# Patient Record
Sex: Female | Born: 1975
Health system: Southern US, Community
[De-identification: ages and names within clinical notes are randomized; demographics above are authoritative.]

## PROBLEM LIST (undated history)

## (undated) ENCOUNTER — Emergency Department (HOSPITAL_COMMUNITY): Payer: Medicare Other | Source: Home / Self Care

## (undated) ENCOUNTER — Emergency Department (HOSPITAL_COMMUNITY): Admission: EM | Discharge status: Absent without leave | Payer: Medicare Other

## (undated) DIAGNOSIS — T7840XA Allergy, unspecified, initial encounter: Secondary | ICD-10-CM

## (undated) DIAGNOSIS — K551 Chronic vascular disorders of intestine: Secondary | ICD-10-CM

## (undated) DIAGNOSIS — F411 Generalized anxiety disorder: Secondary | ICD-10-CM

## (undated) DIAGNOSIS — Z9884 Bariatric surgery status: Secondary | ICD-10-CM

## (undated) DIAGNOSIS — Z8619 Personal history of other infectious and parasitic diseases: Secondary | ICD-10-CM

## (undated) DIAGNOSIS — G5620 Lesion of ulnar nerve, unspecified upper limb: Secondary | ICD-10-CM

## (undated) DIAGNOSIS — F5000 Anorexia nervosa, unspecified: Secondary | ICD-10-CM

## (undated) DIAGNOSIS — Z8711 Personal history of peptic ulcer disease: Secondary | ICD-10-CM

## (undated) DIAGNOSIS — M8580 Other specified disorders of bone density and structure, unspecified site: Secondary | ICD-10-CM

## (undated) DIAGNOSIS — K5909 Other constipation: Secondary | ICD-10-CM

## (undated) DIAGNOSIS — Z8739 Personal history of other diseases of the musculoskeletal system and connective tissue: Secondary | ICD-10-CM

## (undated) DIAGNOSIS — F988 Other specified behavioral and emotional disorders with onset usually occurring in childhood and adolescence: Secondary | ICD-10-CM

## (undated) DIAGNOSIS — F321 Major depressive disorder, single episode, moderate: Secondary | ICD-10-CM

## (undated) DIAGNOSIS — Z8669 Personal history of other diseases of the nervous system and sense organs: Secondary | ICD-10-CM

## (undated) DIAGNOSIS — F1910 Other psychoactive substance abuse, uncomplicated: Secondary | ICD-10-CM

## (undated) DIAGNOSIS — K469 Unspecified abdominal hernia without obstruction or gangrene: Secondary | ICD-10-CM

## (undated) DIAGNOSIS — K219 Gastro-esophageal reflux disease without esophagitis: Secondary | ICD-10-CM

## (undated) DIAGNOSIS — F0781 Postconcussional syndrome: Secondary | ICD-10-CM

## (undated) DIAGNOSIS — Z8719 Personal history of other diseases of the digestive system: Secondary | ICD-10-CM

## (undated) DIAGNOSIS — N3941 Urge incontinence: Secondary | ICD-10-CM

## (undated) DIAGNOSIS — F639 Impulse disorder, unspecified: Secondary | ICD-10-CM

## (undated) DIAGNOSIS — G4730 Sleep apnea, unspecified: Secondary | ICD-10-CM

## (undated) DIAGNOSIS — M810 Age-related osteoporosis without current pathological fracture: Secondary | ICD-10-CM

## (undated) DIAGNOSIS — E785 Hyperlipidemia, unspecified: Secondary | ICD-10-CM

## (undated) HISTORY — DX: Chronic vascular disorders of intestine: K55.1

## (undated) HISTORY — DX: Other specified disorders of bone density and structure, unspecified site: M85.80

## (undated) HISTORY — DX: Lesion of ulnar nerve, unspecified upper limb: G56.20

## (undated) HISTORY — DX: Hyperlipidemia, unspecified: E78.5

## (undated) HISTORY — DX: Sleep apnea, unspecified: G47.30

## (undated) HISTORY — DX: Allergy, unspecified, initial encounter: T78.40XA

## (undated) HISTORY — DX: Postconcussional syndrome: F07.81

## (undated) HISTORY — DX: Other psychoactive substance abuse, uncomplicated: F19.10

## (undated) HISTORY — DX: Gastro-esophageal reflux disease without esophagitis: K21.9

## (undated) HISTORY — DX: Unspecified abdominal hernia without obstruction or gangrene: K46.9

## (undated) HISTORY — PX: CARPAL TUNNEL RELEASE: SHX101

## (undated) HISTORY — DX: Age-related osteoporosis without current pathological fracture: M81.0

## (undated) HISTORY — DX: Personal history of other diseases of the musculoskeletal system and connective tissue: Z87.39

---

## 2002-07-05 HISTORY — PX: LAPAROSCOPIC GASTRIC RESTRICTIVE DUODENAL PROCEDURE (DUODENAL SWITCH): SHX6667

## 2003-08-06 HISTORY — PX: DILATION AND CURETTAGE OF UTERUS: SHX78

## 2011-08-06 HISTORY — PX: OTHER SURGICAL HISTORY: SHX169

## 2013-11-08 ENCOUNTER — Other Ambulatory Visit: Payer: Self-pay

## 2013-11-08 ENCOUNTER — Other Ambulatory Visit (HOSPITAL_COMMUNITY)
Admission: RE | Admit: 2013-11-08 | Discharge: 2013-11-08 | Disposition: A | Discharge status: Home / Self Care | Payer: Medicare Other | Source: Ambulatory Visit | Attending: Family | Admitting: Family

## 2013-11-08 DIAGNOSIS — Z113 Encounter for screening for infections with a predominantly sexual mode of transmission: Secondary | ICD-10-CM | POA: Insufficient documentation

## 2013-11-08 DIAGNOSIS — Z0000 Encounter for general adult medical examination without abnormal findings: Secondary | ICD-10-CM | POA: Insufficient documentation

## 2013-11-16 ENCOUNTER — Encounter (INDEPENDENT_AMBULATORY_CARE_PROVIDER_SITE_OTHER): Payer: Self-pay

## 2013-11-16 ENCOUNTER — Ambulatory Visit (INDEPENDENT_AMBULATORY_CARE_PROVIDER_SITE_OTHER): Payer: 59 | Admitting: Psychiatry

## 2013-11-16 ENCOUNTER — Encounter (HOSPITAL_COMMUNITY): Payer: Self-pay | Admitting: Psychiatry

## 2013-11-16 VITALS — BP 110/74 | HR 65 | Ht 62.0 in | Wt 107.2 lb

## 2013-11-16 DIAGNOSIS — F5000 Anorexia nervosa, unspecified: Secondary | ICD-10-CM

## 2013-11-16 DIAGNOSIS — F1220 Cannabis dependence, uncomplicated: Secondary | ICD-10-CM

## 2013-11-16 DIAGNOSIS — G4700 Insomnia, unspecified: Secondary | ICD-10-CM

## 2013-11-16 DIAGNOSIS — F639 Impulse disorder, unspecified: Secondary | ICD-10-CM

## 2013-11-16 DIAGNOSIS — F1210 Cannabis abuse, uncomplicated: Secondary | ICD-10-CM

## 2013-11-16 DIAGNOSIS — F32A Depression, unspecified: Secondary | ICD-10-CM

## 2013-11-16 DIAGNOSIS — F411 Generalized anxiety disorder: Secondary | ICD-10-CM

## 2013-11-16 DIAGNOSIS — F329 Major depressive disorder, single episode, unspecified: Secondary | ICD-10-CM

## 2013-11-16 MED ORDER — ZOLPIDEM TARTRATE 10 MG PO TABS: 10.0000 mg | ORAL_TABLET | Freq: Every evening | ORAL | Status: DC | PRN

## 2013-11-16 MED ORDER — LAMOTRIGINE 25 MG PO TABS: ORAL_TABLET | ORAL | Status: DC

## 2013-11-16 MED ORDER — LAMOTRIGINE 150 MG PO TABS: 300.0000 mg | ORAL_TABLET | Freq: Every day | ORAL | Status: DC

## 2013-11-16 MED ORDER — LAMOTRIGINE 200 MG PO TABS: ORAL_TABLET | ORAL | Status: DC

## 2013-11-16 MED ORDER — LORAZEPAM 0.5 MG PO TABS: 0.5000 mg | ORAL_TABLET | Freq: Two times a day (BID) | ORAL | Status: DC | PRN
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
## 2013-11-16 NOTE — Patient Instructions (Signed)
Fax labs to [phone]   Continue Ativan and Ambien. Increase Lamical to 275mg  for 2 weeks then increase to 300mg  daily.   Start therapy for eating disorder

## 2013-11-17 ENCOUNTER — Encounter (HOSPITAL_COMMUNITY): Payer: Self-pay | Admitting: Psychiatry

## 2013-11-17 DIAGNOSIS — F5000 Anorexia nervosa, unspecified: Secondary | ICD-10-CM | POA: Insufficient documentation

## 2013-11-17 DIAGNOSIS — G4700 Insomnia, unspecified: Secondary | ICD-10-CM | POA: Insufficient documentation

## 2013-11-17 DIAGNOSIS — F639 Impulse disorder, unspecified: Secondary | ICD-10-CM | POA: Insufficient documentation

## 2013-11-17 DIAGNOSIS — F1210 Cannabis abuse, uncomplicated: Secondary | ICD-10-CM | POA: Insufficient documentation

## 2013-12-01 ENCOUNTER — Other Ambulatory Visit: Payer: Self-pay | Admitting: Gastroenterology

## 2013-12-01 DIAGNOSIS — R109 Unspecified abdominal pain: Secondary | ICD-10-CM

## 2013-12-07 ENCOUNTER — Ambulatory Visit
Admission: RE | Admit: 2013-12-07 | Discharge: 2013-12-07 | Disposition: A | Discharge status: Home / Self Care | Payer: Medicare Other | Source: Ambulatory Visit | Attending: Gastroenterology | Admitting: Gastroenterology

## 2013-12-07 DIAGNOSIS — R109 Unspecified abdominal pain: Secondary | ICD-10-CM

## 2013-12-07 MED ORDER — IOHEXOL 300 MG/ML  SOLN
100.0000 mL | Freq: Once | INTRAMUSCULAR | Status: AC | PRN
  Administered 2013-12-07: 100 mL via INTRAVENOUS

## 2013-12-24 ENCOUNTER — Encounter (HOSPITAL_COMMUNITY): Payer: Self-pay | Admitting: Emergency Medicine

## 2013-12-24 ENCOUNTER — Emergency Department (HOSPITAL_COMMUNITY): Payer: Medicare Other

## 2013-12-24 ENCOUNTER — Emergency Department (HOSPITAL_COMMUNITY)
Admission: EM | Admit: 2013-12-24 | Discharge: 2013-12-24 | Disposition: A | Discharge status: Home / Self Care | Payer: Medicare Other | Attending: Emergency Medicine | Admitting: Emergency Medicine

## 2013-12-24 DIAGNOSIS — F603 Borderline personality disorder: Secondary | ICD-10-CM | POA: Insufficient documentation

## 2013-12-24 DIAGNOSIS — Z880 Allergy status to penicillin: Secondary | ICD-10-CM | POA: Insufficient documentation

## 2013-12-24 DIAGNOSIS — F17200 Nicotine dependence, unspecified, uncomplicated: Secondary | ICD-10-CM | POA: Insufficient documentation

## 2013-12-24 DIAGNOSIS — Z79899 Other long term (current) drug therapy: Secondary | ICD-10-CM | POA: Insufficient documentation

## 2013-12-24 DIAGNOSIS — R42 Dizziness and giddiness: Secondary | ICD-10-CM | POA: Insufficient documentation

## 2013-12-24 DIAGNOSIS — E860 Dehydration: Secondary | ICD-10-CM | POA: Insufficient documentation

## 2013-12-24 DIAGNOSIS — M949 Disorder of cartilage, unspecified: Secondary | ICD-10-CM

## 2013-12-24 DIAGNOSIS — K259 Gastric ulcer, unspecified as acute or chronic, without hemorrhage or perforation: Secondary | ICD-10-CM | POA: Insufficient documentation

## 2013-12-24 DIAGNOSIS — M899 Disorder of bone, unspecified: Secondary | ICD-10-CM | POA: Insufficient documentation

## 2013-12-24 DIAGNOSIS — Z8619 Personal history of other infectious and parasitic diseases: Secondary | ICD-10-CM | POA: Insufficient documentation

## 2013-12-24 DIAGNOSIS — Z8679 Personal history of other diseases of the circulatory system: Secondary | ICD-10-CM | POA: Insufficient documentation

## 2013-12-24 DIAGNOSIS — K219 Gastro-esophageal reflux disease without esophagitis: Secondary | ICD-10-CM | POA: Insufficient documentation

## 2013-12-24 DIAGNOSIS — F5000 Anorexia nervosa, unspecified: Secondary | ICD-10-CM

## 2013-12-24 HISTORY — DX: Anorexia nervosa, unspecified: F50.00

## 2013-12-24 LAB — D-DIMER, QUANTITATIVE: D-Dimer, Quant: <0.27 ug/mL-FEU (ref 0.00–0.48)

## 2013-12-24 LAB — CBC
HCT: 38.3 % (ref 36.0–46.0)
Hemoglobin: 12.6 g/dL (ref 12.0–15.0)
MCH: 29.2 pg (ref 26.0–34.0)
MCHC: 32.9 g/dL (ref 30.0–36.0)
MCV: 88.9 fL (ref 78.0–100.0)
Platelets: 301 10*3/uL (ref 150–400)
RBC: 4.31 MIL/uL (ref 3.87–5.11)
RDW: 12.2 % (ref 11.5–15.5)
WBC: 7.5 10*3/uL (ref 4.0–10.5)

## 2013-12-24 LAB — BASIC METABOLIC PANEL
BUN: 11 mg/dL (ref 6–23)
CO2: 25 mEq/L (ref 19–32)
Calcium: 9.6 mg/dL (ref 8.4–10.5)
Chloride: 98 mEq/L (ref 96–112)
Creatinine, Ser: 0.65 mg/dL (ref 0.50–1.10)
GFR calc Af Amer: >90 mL/min (ref >90)
GFR calc non Af Amer: >90 mL/min (ref >90)
Glucose, Bld: 136 mg/dL — ABNORMAL HIGH (ref 70–99)
Potassium: 4.8 mEq/L (ref 3.7–5.3)
Sodium: 135 mEq/L — ABNORMAL LOW (ref 137–147)

## 2013-12-24 LAB — HCG, SERUM, QUALITATIVE: Preg, Serum: NEGATIVE

## 2013-12-24 MED ORDER — SODIUM CHLORIDE 0.9 % IV BOLUS (SEPSIS)
1000.0000 mL | Freq: Once | INTRAVENOUS | Status: AC
  Administered 2013-12-24: 1000 mL via INTRAVENOUS

## 2013-12-24 NOTE — Discharge Instructions (Signed)
Please follow with your primary care doctor in the next 2 days for a check-up. They must obtain records for further management.   Do not hesitate to return to the Emergency Department for any new, worsening or concerning symptoms.   Dehydration, Adult Dehydration is when you lose more fluids from the body than you take in. Vital organs like the kidneys, brain, and heart cannot function without a proper amount of fluids and salt. Any loss of fluids from the body can cause dehydration.  CAUSES   Vomiting.  Diarrhea.  Excessive sweating.  Excessive urine output.  Fever. SYMPTOMS  Mild dehydration  Thirst.  Dry lips.  Slightly dry mouth. Moderate dehydration  Very dry mouth.  Sunken eyes.  Skin does not bounce back quickly when lightly pinched and released.  Dark urine and decreased urine production.  Decreased tear production.  Headache. Severe dehydration  Very dry mouth.  Extreme thirst.  Rapid, weak pulse (more than 100 beats per minute at rest).  Cold hands and feet.  Not able to sweat in spite of heat and temperature.  Rapid breathing.  Blue lips.  Confusion and lethargy.  Difficulty being awakened.  Minimal urine production.  No tears. DIAGNOSIS  Your caregiver will diagnose dehydration based on your symptoms and your exam. Blood and urine tests will help confirm the diagnosis. The diagnostic evaluation should also identify the cause of dehydration. TREATMENT  Treatment of mild or moderate dehydration can often be done at home by increasing the amount of fluids that you drink. It is best to drink small amounts of fluid more often. Drinking too much at one time can make vomiting worse. Refer to the home care instructions below. Severe dehydration needs to be treated at the hospital where you will probably be given intravenous (IV) fluids that contain water and electrolytes. HOME CARE INSTRUCTIONS   Ask your caregiver about specific rehydration  instructions.  Drink enough fluids to keep your urine clear or pale yellow.  Drink small amounts frequently if you have nausea and vomiting.  Eat as you normally do.  Avoid:  Foods or drinks high in sugar.  Carbonated drinks.  Juice.  Extremely hot or cold fluids.  Drinks with caffeine.  Fatty, greasy foods.  Alcohol.  Tobacco.  Overeating.  Gelatin desserts.  Wash your hands well to avoid spreading bacteria and viruses.  Only take over-the-counter or prescription medicines for pain, discomfort, or fever as directed by your caregiver.  Ask your caregiver if you should continue all prescribed and over-the-counter medicines.  Keep all follow-up appointments with your caregiver. SEEK MEDICAL CARE IF:  You have abdominal pain and it increases or stays in one area (localizes).  You have a rash, stiff neck, or severe headache.  You are irritable, sleepy, or difficult to awaken.  You are weak, dizzy, or extremely thirsty. SEEK IMMEDIATE MEDICAL CARE IF:   You are unable to keep fluids down or you get worse despite treatment.  You have frequent episodes of vomiting or diarrhea.  You have blood or green matter (bile) in your vomit.  You have blood in your stool or your stool looks black and tarry.  You have not urinated in 6 to 8 hours, or you have only urinated a small amount of very dark urine.  You have a fever.  You faint. MAKE SURE YOU:   Understand these instructions.  Will watch your condition.  Will get help right away if you are not doing well or get worse. Document  Released: 07/22/2005 Document Revised: 10/14/2011 Document Reviewed: 03/11/2011 [facility] Patient Information 2014 [place], [place].  Anorexia Nervosa Anorexia nervosa is an illness in which people have difficulty with their body perception. They often see themselves as fat even though they may be dangerously thin. They have intense fears of gaining weight. Often they weigh themselves  several times per day. They often exercise compulsively and constantly starve themselves. As a result, they take in far too few calories to sustain themselves. Many anorexics do not realize there is a problem. Often, if a problem is recognized, it is rationalized or denied. Because of the constant state of starvation, many other medical problems surface. Some of the problems seen include:  The salts or ions in the blood get off kilter (electrolyte imbalance).  Fatigue and loss of mental acuity.  The calcium density in the bones is lost (osteoporosis). This leads to weaker bones which are easy to break.  Loss of menstrual cycles (amenorrhea).  Abnormally low heart rate. Cardiac problems often result in death.  Inflammation of the colon (colitis). TREATMENT  Many different therapies are used for anorexia nervosa. Not all therapies work the same for all people. This is much the same as the differences seen in people using different medications for the same problem. Help is available, however. SEEK MEDICAL CARE IF:  You or a loved one is suspected of having anorexia. It is necessary to get medical help. Anorexia nervosa can be a fatal disease. It should not be neglected. It will not get better or go away on its own. Almost 1 person in 6 with anorexia will die of the illness or commit suicide. This is a psychiatric disorder. Counseling can help with an illness that can be devastating. Your caregiver can guide you to proper information and treatment sources for this. Document Released: 07/19/2000 Document Revised: 10/14/2011 Document Reviewed: 06/01/2007 [facility] Patient Information 2014 [place], [place].

## 2013-12-24 NOTE — ED Provider Notes (Signed)
CSN: [id number]     Arrival date & time 12/24/13  1548 History   First MD Initiated Contact with Patient 12/24/13 1815     Chief Complaint  Patient presents with  . Tachycardia  . Dizziness     (Consider location/radiation/quality/duration/timing/severity/associated sxs/prior Treatment) HPI  Karen Hahn is a 38 y.o. female with PMH of anorexia c/o 7 days of worsening light headedness with heart pounding palpitations. Patient states that she is has been eating 2 meals a day however she is fluid restricting because of the anorexia, states that the only water she drinks his with medication, she drinks coffee in the morning. She denies chest pain, shortness of breath endorses a mild cough and runny nose. Pt denies fever,  h/o DVT/PE, calf pain or leg swelling, hemoptysis, recent immobilization, cancer/chemotherapy in the last 6 months, exogenous estrogen.    Past Medical History  Diagnosis Date  . SMAS (superior mesenteric artery syndrome)   . History of borderline personality disorder   . GERD (gastroesophageal reflux disease)   . Hypotension   . Osteopenia   . Gastric ulcer due to Helicobacter pylori   . Anorexia nervosa without bulimia    Past Surgical History  Procedure Laterality Date  . Laparoscopic abdominal exploration  2003  . Gastric bypass  07/2002  . Dilation and curettage of uterus  2005   Family History  Problem Relation Age of Onset  . Alcohol abuse Mother   . Anxiety disorder Mother   . Depression Mother     severe with hx of suicide attempt. treated with ECT  . Anxiety disorder Father   . Drug abuse Father   . Alcohol abuse Maternal Uncle   . Anxiety disorder Maternal Grandmother   . Depression Maternal Grandmother    History  Substance Use Topics  . Smoking status: Current Some Day Smoker -- 0.25 packs/day for .5 years    Types: Cigarettes  . Smokeless tobacco: Never Used  . Alcohol Use: No   OB History   Grav Para Term Preterm Abortions TAB SAB  Ect Mult Living                 Review of Systems  10 systems reviewed and found to be negative, except as noted in the HPI.  Allergies  Penicillins and Diflucan  Home Medications   Prior to Admission medications   Medication Sig Start Date End Date Taking? Authorizing Provider  Calcium-Vitamin D-Vitamin K 500-100-40 MG-UNT-MCG CHEW Chew 1 tablet by mouth 2 (two) times daily.   Yes Historical Provider, MD  cholecalciferol (VITAMIN D) 1000 UNITS tablet Take 2,000 Units by mouth daily.   Yes Historical Provider, MD  dicyclomine (BENTYL) 10 MG/5ML syrup Take 10 mg by mouth 3 (three) times daily before meals.   Yes Historical Provider, MD  lamoTRIgine (LAMICTAL) 150 MG tablet Take 2 tablets (300 mg total) by mouth daily. 11/16/13  Yes Oletta Darter, MD  LORazepam (ATIVAN) 0.5 MG tablet Take 1 tablet (0.5 mg total) by mouth 2 (two) times daily as needed for anxiety. 11/16/13  Yes Oletta Darter, MD  Multiple Vitamins-Minerals (MULTIVITAL PO) Take 1 tablet by mouth 2 (two) times daily.   Yes Historical Provider, MD  omeprazole (PRILOSEC) 40 MG capsule Take 40 mg by mouth daily.   Yes Historical Provider, MD  ondansetron (ZOFRAN) 8 MG tablet Take by mouth 3 (three) times daily as needed for nausea or vomiting.   Yes Historical Provider, MD  ranitidine (ZANTAC) 150 MG tablet Take  150 mg by mouth 2 (two) times daily.   Yes Historical Provider, MD  zolpidem (AMBIEN) 10 MG tablet Take 1 tablet (10 mg total) by mouth at bedtime as needed for sleep. 11/16/13  Yes Oletta Darter, MD   BP 113/70  Pulse 104  Temp(Src) 98.3 F (36.8 C) (Oral)  Resp 11  Ht 5\' 2"  (1.575 m)  Wt 104 lb (47.174 kg)  BMI 19.02 kg/m2  SpO2 98%  LMP 11/24/2013 Physical Exam  Nursing note and vitals reviewed. Constitutional: She is oriented to person, place, and time. She appears well-developed. No distress.  Frail, poorly nourished  HENT:  Head: Normocephalic and atraumatic.  Mouth/Throat: Oropharynx is clear and  moist.  Very dry mucous membranes  Eyes: Conjunctivae and EOM are normal. Pupils are equal, round, and reactive to light.  Neck: Normal range of motion.  Cardiovascular: Normal rate, regular rhythm and intact distal pulses.   Pulmonary/Chest: Effort normal and breath sounds normal. No stridor.  Abdominal: Soft. Bowel sounds are normal.  Musculoskeletal: Normal range of motion. She exhibits no edema and no tenderness.  No calf asymmetry, superficial collaterals, palpable cords, edema, Homans sign negative bilaterally.    Neurological: She is alert and oriented to person, place, and time.  Skin: She is not diaphoretic.  Psychiatric: She has a normal mood and affect.    ED Course  Procedures (including critical care time) Labs Review Labs Reviewed  BASIC METABOLIC PANEL - Abnormal; Notable for the following:    Sodium 135 (*)    Glucose, Bld 136 (*)    All other components within normal limits  CBC  HCG, SERUM, QUALITATIVE  D-DIMER, QUANTITATIVE    Imaging Review Dg Chest 2 View  12/24/2013   CLINICAL DATA:  Dizziness, increased heart rate  EXAM: CHEST  2 VIEW  COMPARISON:  None.  FINDINGS: Cardiomediastinal silhouette is unremarkable. No acute infiltrate or pleural effusion. No pulmonary edema. Bony thorax is unremarkable.  IMPRESSION: No active cardiopulmonary disease.   Electronically Signed   By: 12/26/2013 M.D.   On: 12/24/2013 19:06     EKG Interpretation None     Normal sinus rhythm, 97 beats per minute, mild right axis deviation, normal intervals, nonspecific ST/T-wave changes.   MDM   Final diagnoses:  Anorexia nervosa  Dehydration    Filed Vitals:   12/24/13 1601 12/24/13 1839  BP: 112/74 113/70  Pulse: 104   Temp: 98.3 F (36.8 C)   TempSrc: Oral   Resp: 16 11  Height: 5\' 2"  (1.575 m)   Weight: 104 lb (47.174 kg)   SpO2: 98% 98%    Medications  sodium chloride 0.9 % bolus 1,000 mL (not administered)    Karen Hahn is a 38 y.o. female  presenting with  palpitations and dizziness. Likely secondary to dehydration from anorexia and water restriction. Clinically very dry, counseled patient extensively on importance of hydration. Patient will be bolused and ED. Electrolytes with no significant abnormalities, EKG with right-sided strain, therefore, d-dimer ordered. Which is negative. Chest x-ray with no abnormalities. Patient will be bolused a liter of fluid. Patient's establish outpatient care, will followup with primary care on June 4. Discussion return precautions with both patient and friend. Seems reliable for followup.   Note: Portions of this report may have been transcribed using voice recognition software. Every effort was made to ensure accuracy; however, inadvertent computerized transcription errors may be present     31, PA-C 12/24/13 2016

## 2013-12-24 NOTE — ED Notes (Signed)
PResents with dizziness, feeling heart racing, diaphoretic episodes, feeling really cold and lightheaded for a couple of weeks. HR is 98 at this time. Pt reports she does not feel it now. Endorses Anorexia and not eating or drinking well over the last few weeks. 20 years of anorexia DX.

## 2013-12-25 NOTE — ED Provider Notes (Signed)
Medical screening examination/treatment/procedure(s) were performed by non-physician practitioner and as supervising physician I was immediately available for consultation/collaboration.   EKG Interpretation None       Yaroslav Gombos, MD 12/25/13 1551 

## 2014-01-05 ENCOUNTER — Telehealth (HOSPITAL_COMMUNITY): Payer: Self-pay | Admitting: *Deleted

## 2014-01-05 DIAGNOSIS — F411 Generalized anxiety disorder: Secondary | ICD-10-CM

## 2014-01-05 DIAGNOSIS — G4700 Insomnia, unspecified: Secondary | ICD-10-CM

## 2014-01-05 NOTE — Telephone Encounter (Signed)
Patient left UE:AVWU seen in April.MD called in prescriptions for Ambien and Ativan to [facility], but pharmacy says it was too soon so they did not fill them.Pharmacy instructed her to call MD to have them called in. Contacted patient:Informed patient that per chart, prescriptions for those two medications were printed on 11/16/13. Patient states she did not receive any prescriptions after her appt. Contacted pharmacy, they did not record receiving either of these prescriptions called in to them. Informed patient that provider will be informed of her request.

## 2014-01-06 MED ORDER — ZOLPIDEM TARTRATE 10 MG PO TABS: 10.0000 mg | ORAL_TABLET | Freq: Every evening | ORAL | Status: DC | PRN

## 2014-01-06 MED ORDER — LORAZEPAM 0.5 MG PO TABS: 0.5000 mg | ORAL_TABLET | Freq: Two times a day (BID) | ORAL | Status: DC | PRN

## 2014-01-06 NOTE — Addendum Note (Signed)
Addended by: Tonny Bollman on: 01/06/2014 12:29 PM   Modules accepted: Orders

## 2014-01-11 ENCOUNTER — Ambulatory Visit: Payer: Self-pay | Admitting: Psychology

## 2014-01-18 ENCOUNTER — Ambulatory Visit (INDEPENDENT_AMBULATORY_CARE_PROVIDER_SITE_OTHER): Payer: 59 | Admitting: Psychiatry

## 2014-01-18 ENCOUNTER — Encounter (HOSPITAL_COMMUNITY): Payer: Self-pay | Admitting: Psychiatry

## 2014-01-18 VITALS — BP 107/67 | HR 82 | Ht 62.0 in | Wt 107.2 lb

## 2014-01-18 DIAGNOSIS — F1220 Cannabis dependence, uncomplicated: Secondary | ICD-10-CM

## 2014-01-18 DIAGNOSIS — F32A Depression, unspecified: Secondary | ICD-10-CM

## 2014-01-18 DIAGNOSIS — F5000 Anorexia nervosa, unspecified: Secondary | ICD-10-CM

## 2014-01-18 DIAGNOSIS — F411 Generalized anxiety disorder: Secondary | ICD-10-CM

## 2014-01-18 DIAGNOSIS — F639 Impulse disorder, unspecified: Secondary | ICD-10-CM

## 2014-01-18 DIAGNOSIS — F329 Major depressive disorder, single episode, unspecified: Secondary | ICD-10-CM

## 2014-01-18 DIAGNOSIS — F3289 Other specified depressive episodes: Secondary | ICD-10-CM

## 2014-01-18 MED ORDER — LORAZEPAM 0.5 MG PO TABS: 0.5000 mg | ORAL_TABLET | Freq: Two times a day (BID) | ORAL | Status: DC | PRN

## 2014-01-18 MED ORDER — OLANZAPINE 5 MG PO TABS: 5.0000 mg | ORAL_TABLET | Freq: Every day | ORAL | Status: DC

## 2014-01-18 MED ORDER — LAMOTRIGINE 150 MG PO TABS: 300.0000 mg | ORAL_TABLET | Freq: Every day | ORAL | Status: DC

## 2014-01-18 NOTE — Progress Notes (Signed)
[facility] [address] Progress Note  Karen Hahn [id number] 38 y.o.  01/18/2014 1:07 PM  Chief Complaint: angry outbursts  History of Present Illness: Here with best friend who pt gave permission to stay and share information.  Pt states she is having angry outbursts and will get verbally aggressive, throwing things and slamming doors. It happens more around the time of her period but will happen at other times as well. Denies any change in frequency.  Pt is picking at her skin on daily basis. States it helps relieve her anxiety a little.  Depression is a little worse. Pt doesn't feel motivated to do much during the day but can be persuaded by her friend. Reports memory is poor and she is easily distracted. Reports getting lost and forgetful in conversations. Pt is unable to recall conversations and events. Sates it has been going on for years and feels like it is getting worse recently.   Anxiety is significantly increased. States everything causes anxiety.  Endorsing nausea, stomach aches, racing thoughts, insomnia. Reports sleeping 2-3 hrs/night. Ambien helps her fall asleep but not stay asleep. Her friend states Ambien causes confusion and disorientation after pt takes it. Pt is taking Ativan BID and it "levels me out a little". Friend states Ativan works to calm pt down a little. Energy is fair.   In May she went to the ED for dehydration and severe appetite restriction. Triggered by the death of her cat. Pt now has a new cat. Reports she is now eating 2-3 meals a day. Pt is eating a decent proportion each meal. She states weight is 110.2 lbs.   Still smoking THC thur out each daily.   Pt is starting therapy next month with Dr. Theron Arista.   Not sure if increased dose of Lamictal is helping.  Suicidal Ideation: No Plan Formed: No Patient has means to carry out plan: No  Homicidal Ideation: No Plan Formed: No Patient has means to carry out plan: No  Review of  Systems: Psychiatric: Agitation: Yes Hallucination: No Depressed Mood: Yes Insomnia: Yes Hypersomnia: No Altered Concentration: Yes Feels Worthless: Yes Grandiose Ideas: No Belief In Special Powers: No New/Increased Substance Abuse: No Compulsions: Yes  Neurologic: Headache: No Seizure: No Paresthesias: No  Past Medical, Family, Social History: Endorsing family hx of depression, alcohol abuse and anxiety disorder. Pt is living with her friend in [place], [place]. She is divorced and was married for 5 yrs. Pt has a GED. Currently unemployed. Pt smokes a few cigs a week. She smokes THC thru the day. Denies alcohol use.   Outpatient Encounter Prescriptions as of 01/18/2014  Medication Sig  . Calcium-Vitamin D-Vitamin K 500-100-40 MG-UNT-MCG CHEW Chew 1 tablet by mouth 2 (two) times daily.  . cholecalciferol (VITAMIN D) 1000 UNITS tablet Take 2,000 Units by mouth daily.  Marland Kitchen dicyclomine (BENTYL) 10 MG/5ML syrup Take 10 mg by mouth 3 (three) times daily before meals.  . lamoTRIgine (LAMICTAL) 150 MG tablet Take 2 tablets (300 mg total) by mouth daily.  Marland Kitchen LORazepam (ATIVAN) 0.5 MG tablet Take 1 tablet (0.5 mg total) by mouth 2 (two) times daily as needed for anxiety.  . Multiple Vitamins-Minerals (MULTIVITAL PO) Take 1 tablet by mouth 2 (two) times daily.  Marland Kitchen omeprazole (PRILOSEC) 40 MG capsule Take 40 mg by mouth daily.  . ondansetron (ZOFRAN) 8 MG tablet Take by mouth 3 (three) times daily as needed for nausea or vomiting.  . ranitidine (ZANTAC) 150 MG tablet Take 150 mg by  mouth 2 (two) times daily.  Marland Kitchen zolpidem (AMBIEN) 10 MG tablet Take 1 tablet (10 mg total) by mouth at bedtime as needed for sleep.    Past Psychiatric History/Hospitalization(s): Anxiety: Yes Bipolar Disorder: No Depression: Yes Mania: No Psychosis: No Schizophrenia: No Personality Disorder: No Hospitalization for psychiatric illness: Yes numerous- 2 intensive eating program treatments in 2013 and 2014. BH  eating d/o with Dr. Orland Dec Nov 2011. Multiple inpt hospitalizations at [facility] (2011, 2011, 2005), [place] (3x), [facility], [place], [place] (06/2013).  History of Electroconvulsive Shock Therapy: No Prior Suicide Attempts: No  Physical Exam: Constitutional:  BP 107/67  Pulse 82  Ht 5\' 2"  (1.575 m)  Wt 107 lb 3.2 oz (48.626 kg)  BMI 19.60 kg/m2  LMP 11/24/2013  General Appearance: alert, oriented, no acute distress  Musculoskeletal: Strength & Muscle Tone: within normal limits Gait & Station: normal Patient leans: N/A  Mental Status Examination/Evaluation:  Objective: Appearance: Fairly Groomed and Neat, thin. visible scars and scabs all over face. Appears older than stated age.   Eye Contact:: Good   Speech: Clear and Coherent and Normal Rate   Volume: Normal   Mood: depressed  Affect: Appropriate and Full Range   Thought Process: Goal Directed, Linear and Logical   Orientation: Full (Time, Place, and Person)   Thought Content: WDL   Suicidal Thoughts: No   Homicidal Thoughts: No   Judgement: Fair   Insight: Fair   Psychomotor Activity: Normal   Akathisia: No   Handed: Right   AIMS (if indicated): n/a     Medical Decision Making (Choose Three): Review of Psycho-Social Stressors (1), Review or order clinical lab tests (1), Established Problem, Worsening (2), Review of Medication Regimen & Side Effects (2) and Review of New Medication or Change in Dosage (2)  Assessment: AXIS I  Axis I: Impulse control disorder, Anorexia Nervosa, Cannabis dependence, Depressive d/o NOS, GAD  AXIS II  Deferred   AXIS III  Past Medical History    Diagnosis  Date    .  SMAS (superior mesenteric artery syndrome)     .  History of borderline personality disorder     .  GERD (gastroesophageal reflux disease)     .  Hypotension     .  Osteopenia     .  Gastric ulcer due to Helicobacter pylori    AXIS IV  other psychosocial or environmental problems and problems with primary support group    AXIS V  51-60 moderate symptoms       Treatment Plan/Recommendations:  Plan of Care:  Start trial of Zyprexa, risks/benefits and SE of the medication discussed. Pt verbalized understanding and verbal consent obtained for treatment.  Affirm with the patient that the medications are taken as ordered. Patient expressed understanding of how their medications were to be used.    Laboratory: reviewed labs and EKG from 12/24/13. EKG shows QTc 449, NSR with right axis deviation, possible right ventricular hypertrophy  Psychotherapy:Therapy: brief supportive therapy provided. Discussed psychosocial stressors in detail.      Medications: discontinue Ambien as it is no longer effective Continue Lamictal 300mg  for mood stabalization Continue Ativan 0.5mg  po BID for prn for anxiety Start trial of Zyprexa 5mg  po qHS for mood stabalization, adjunct for depression and anxiety Consider Wellbutrin if depression does not improve  Routine PRN Medications: Yes   Consultations: encouraged pt to attend therapy with Dr. Theron Arista  Safety Concerns: Pt denies SI and is at an acute low risk for suicide.Patient  told to call clinic if any problems occur. Patient advised to go to ER  if she should develop SI/HI, side effects, or if symptoms worsen. Has crisis numbers to call if needed. Pt verbalized understanding.   Other: F/up in 2 months or sooner if needed      Oletta Darter, MD 01/18/2014

## 2014-02-08 ENCOUNTER — Ambulatory Visit (INDEPENDENT_AMBULATORY_CARE_PROVIDER_SITE_OTHER): Payer: 59 | Admitting: Psychiatry

## 2014-02-08 DIAGNOSIS — F509 Eating disorder, unspecified: Secondary | ICD-10-CM

## 2014-02-08 DIAGNOSIS — F331 Major depressive disorder, recurrent, moderate: Secondary | ICD-10-CM

## 2014-03-08 ENCOUNTER — Telehealth (HOSPITAL_COMMUNITY): Payer: Self-pay | Admitting: *Deleted

## 2014-03-08 ENCOUNTER — Ambulatory Visit (INDEPENDENT_AMBULATORY_CARE_PROVIDER_SITE_OTHER): Payer: 59 | Admitting: Psychiatry

## 2014-03-08 ENCOUNTER — Encounter (HOSPITAL_COMMUNITY): Payer: Self-pay | Admitting: Psychiatry

## 2014-03-08 VITALS — BP 100/69 | HR 90 | Ht 62.0 in | Wt 111.2 lb

## 2014-03-08 DIAGNOSIS — F329 Major depressive disorder, single episode, unspecified: Secondary | ICD-10-CM

## 2014-03-08 DIAGNOSIS — F331 Major depressive disorder, recurrent, moderate: Secondary | ICD-10-CM

## 2014-03-08 DIAGNOSIS — F32A Depression, unspecified: Secondary | ICD-10-CM | POA: Insufficient documentation

## 2014-03-08 DIAGNOSIS — F411 Generalized anxiety disorder: Secondary | ICD-10-CM

## 2014-03-08 DIAGNOSIS — F3289 Other specified depressive episodes: Secondary | ICD-10-CM

## 2014-03-08 DIAGNOSIS — F509 Eating disorder, unspecified: Secondary | ICD-10-CM

## 2014-03-08 MED ORDER — MIRTAZAPINE 15 MG PO TABS: 15.0000 mg | ORAL_TABLET | Freq: Every day | ORAL | Status: DC

## 2014-03-08 MED ORDER — OLANZAPINE 10 MG PO TABS: 10.0000 mg | ORAL_TABLET | Freq: Every day | ORAL | Status: DC

## 2014-03-08 MED ORDER — LAMOTRIGINE 150 MG PO TABS: 300.0000 mg | ORAL_TABLET | Freq: Every day | ORAL | Status: DC

## 2014-03-08 NOTE — Progress Notes (Signed)
Patient ID: Karen Hahn, female   DOB: 1975-10-03, 38 y.o.   MRN: [id number]  [facility] [id number] Progress Note  [name] [id number] 38 y.o.  03/08/2014 11:34 AM  Chief Complaint: less irritable than before  History of Present Illness: Here with best friend who pt gave permission to stay and share information.  Pt states she is having less frequent angry outbursts and verbally aggression. She is no longer throwing things and slamming doors. Irritability remains a problem on a daily basis. Pt is snapping at everyone and doesn't want to interact with others during these times. States she is easily frustrated. Pt doesn't think Zyprexa is helping with irritability.   Pt is picking at her skin on daily basis. States it helps relieve her anxiety a little. No change in frequency.   Depression is bad and she is sad daily. Pt still doesn't feel motivated to do much during the day but can be persuaded by her friend. Denies anhedonia. Pt is only sleeping about 5 hrs/night with 2 tabs of Ativan. States she is only taking it at night and doesn't think it is helping much with anxiety. She is no longer taking Ambien. Energy is fair.   Reports memory is poor and she is easily distracted. Pt is unable to focus and is overstimulated easily. Reports getting lost and forgetful in conversations. Pt is unable to recall conversations and events. Pt is unable to follow directions. Sates it has been going on for years and feels like it is getting worse recently. Pt thinks she has ADD.   Anxiety is significantly increased and it is getting worse. States everything causes anxiety.  Endorsing nausea, stomach aches, racing thoughts, insomnia.  Pt is taking Ativan BID and it "levels me out a little". Friend states Ativan works to calm pt down a little.   Reports she is now eating 2-3 meals a day with snacks thru out the day. Pt is eating a decent portion each meal. Pt states THC helps to increase her  appetite. She states weight is 111 lbs today. Body image remains low.   Still smoking THC thur out each day.   Pt is in therapy with Dr. Theron Arista. She is seeing him once/month and likes it very much. They are working on her hx of abuse.   States she is taking Lamictal, Zyprexa and Ativan as prescribed. Denies SE from meds.   Suicidal Ideation: No Plan Formed: No Patient has means to carry out plan: No  Homicidal Ideation: No Plan Formed: No Patient has means to carry out plan: No  Review of Systems: Psychiatric: Agitation: Yes Hallucination: No Depressed Mood: Yes Insomnia: Yes Hypersomnia: No Altered Concentration: Yes Feels Worthless: Yes Grandiose Ideas: No Belief In Special Powers: No New/Increased Substance Abuse: No Compulsions: Yes  Neurologic: Headache: No Seizure: No Paresthesias: No  Past Medical, Family, Social History: Endorsing family hx of depression, ADD, alcohol abuse and anxiety disorder. Pt is living with her friend in [place], [place]. She is divorced and was married for 5 yrs. Pt has a GED. Currently unemployed. Pt smokes a few cigs a week. She smokes THC thru the day. Denies alcohol use.   Outpatient Encounter Prescriptions as of 03/08/2014  Medication Sig  . Calcium-Vitamin D-Vitamin K 500-100-40 MG-UNT-MCG CHEW Chew 1 tablet by mouth 2 (two) times daily.  . cholecalciferol (VITAMIN D) 1000 UNITS tablet Take 2,000 Units by mouth daily.  Marland Kitchen dicyclomine (BENTYL) 10 MG/5ML syrup Take 10 mg by mouth 3 (three)  times daily before meals.  . lamoTRIgine (LAMICTAL) 150 MG tablet Take 2 tablets (300 mg total) by mouth daily.  Marland Kitchen LORazepam (ATIVAN) 0.5 MG tablet Take 1 tablet (0.5 mg total) by mouth 2 (two) times daily as needed for anxiety.  . Multiple Vitamins-Minerals (MULTIVITAL PO) Take 1 tablet by mouth 2 (two) times daily.  . Norgestimate-Ethinyl Estradiol Triphasic (ORTHO TRI-CYCLEN, 28,) 0.18/0.215/0.25 MG-35 MCG tablet Take 1 tablet by mouth daily.  Marland Kitchen  OLANZapine (ZYPREXA) 5 MG tablet Take 1 tablet (5 mg total) by mouth at bedtime.  Marland Kitchen omeprazole (PRILOSEC) 40 MG capsule Take 40 mg by mouth daily.  . ondansetron (ZOFRAN) 8 MG tablet Take by mouth 3 (three) times daily as needed for nausea or vomiting.  . ranitidine (ZANTAC) 150 MG tablet Take 150 mg by mouth 2 (two) times daily.    Past Psychiatric History/Hospitalization(s): Anxiety: Yes Bipolar Disorder: No Depression: Yes Mania: No Psychosis: No Schizophrenia: No Personality Disorder: No Hospitalization for psychiatric illness: Yes numerous- 2 intensive eating program treatments in 2013 and 2014. BH eating d/o with Dr. Orland Dec Nov 2011. Multiple inpt hospitalizations at [facility] (2011, 2011, 2005), [place] (3x), [facility], [place], [place] (06/2013).  History of Electroconvulsive Shock Therapy: No Prior Suicide Attempts: No  Physical Exam: Constitutional:  BP 100/69  Pulse 90  Ht 5\' 2"  (1.575 m)  Wt 111 lb 3.2 oz (50.44 kg)  BMI 20.33 kg/m2  General Appearance: alert, oriented, no acute distress  Musculoskeletal: Strength & Muscle Tone: within normal limits Gait & Station: normal Patient leans: N/A  Mental Status Examination/Evaluation: Objective: Attitude: Calm and cooperative  Appearance:  Fairly Groomed and Neat, thin. visible scars and scabs all over face. Appears older than stated age.   Eye Contact::  Good  Speech:  Clear and Coherent and Normal Rate  Volume:  Normal  Mood:  depressed  Affect:  Full Range  Thought Process:  Goal Directed, Linear and Logical  Orientation:  Full (Time, Place, and Person)  Thought Content:  WDL  Suicidal Thoughts:  No  Homicidal Thoughts:  No  Judgement:  Fair  Insight:  Fair  Concentration: good  Memory: Immediate-fair Recent-fair Remote-fair  Recall: fair  Language: fair  Gait and Station: normal  [phone] of Knowledge: average  Psychomotor Activity:  Normal  Akathisia:  No  Handed:  Right  AIMS (if indicated):   Facial and Oral Movements  Muscles of Facial Expression: None, normal  Lips and Perioral Area: None, normal  Jaw: None, normal  Tongue: None, normal Extremity Movements: Upper (arms, wrists, hands, fingers): None, normal  Lower (legs, knees, ankles, toes): None, normal,  Trunk Movements:  Neck, shoulders, hips: None, normal,  Overall Severity : Severity of abnormal movements (highest score from questions above): None, normal  Incapacitation due to abnormal movements: None, normal  Patient's awareness of abnormal movements (rate only patient's report): No Awareness, Dental Status  Current problems with teeth and/or dentures?: No  Does patient usually wear dentures?: No       Medical Decision Making (Choose Three): Review of Psycho-Social Stressors (1), Review or order clinical lab tests (1), Established Problem, Worsening (2), Review of Medication Regimen & Side Effects (2) and Review of New Medication or Change in Dosage (2)  Assessment: AXIS I  Axis I: Impulse control disorder, Anorexia Nervosa, Cannabis dependence, Depressive d/o NOS, GAD  AXIS II  Deferred   AXIS III  Past Medical History    Diagnosis  Date    .  SMAS (  superior mesenteric artery syndrome)     .  History of borderline personality disorder     .  GERD (gastroesophageal reflux disease)     .  Hypotension     .  Osteopenia     .  Gastric ulcer due to Helicobacter pylori    AXIS IV  other psychosocial or environmental problems and problems with primary support group   AXIS V  51-60 moderate symptoms       Treatment Plan/Recommendations:  Plan of Care:   Medication management with supportive therapy. Risks/benefits and SE of the medication discussed. Pt verbalized understanding and verbal consent obtained for treatment.  Affirm with the patient that the medications are taken as ordered. Patient expressed understanding of how their medications were to be used.  - worsening of symptoms   Laboratory: reviewed  labs and EKG from 12/24/13. EKG shows QTc 449, NSR with right axis deviation, possible right ventricular hypertrophy. CBC WNL, Na mildly decreased, glucose elevated  Psychotherapy:Therapy: brief supportive therapy provided. Discussed psychosocial stressors in detail.     -reviewed sleep hygiene in detail  Medications: discontinue  -Continue Lamictal 300mg  for mood stabalization -discontinue Ativan due to memory issues and ineffectiveness -Increase Zyprexa to 10mg  po qHS for mood stabalization, adjunct for depression and anxiety -start trial of Remeron 15mg  po qHS for mood, anxiety, sleep and appetite   Routine PRN Medications: No  Consultations: encouraged pt to attend therapy with Dr. Theron AristaPeter  Safety Concerns: Pt denies SI and is at an acute low risk for suicide.Patient told to call clinic if any problems occur. Patient advised to go to ER  if she should develop SI/HI, side effects, or if symptoms worsen. Has crisis numbers to call if needed. Pt verbalized understanding.   Other: F/up in 2 months or sooner if needed      Oletta DarterAGARWAL, Levi Crass, MD 03/08/2014

## 2014-03-08 NOTE — Telephone Encounter (Signed)
Pharmacist left IY:MEBRAXENMM wants to talk with provider regrding possible drug interaction with Olanzapine and Mirtazapine.

## 2014-03-08 NOTE — Telephone Encounter (Signed)
Spoke with pharmacist who states there is a concern for a drug drug interaction b/w Zyprexa and Remeron. States he spoke with the pt who picked up teh Remeron script. She verbalized understanding and did not fill the Zyprexa prescription.   A/P:Impulse control disorder, Anorexia Nervosa, Cannabis dependence, Depressive d/o NOS, GAD  -Discontinue Zyprexa -Continue Lamictal 300mg  for mood stabalization  -start trial of Remeron 15mg  po qHS for mood, anxiety, sleep and appetite

## 2014-03-08 NOTE — Addendum Note (Signed)
Addended by: Oletta Darter on: 03/08/2014 03:41 PM   Modules accepted: Orders, Medications

## 2014-03-10 ENCOUNTER — Telehealth (HOSPITAL_COMMUNITY): Payer: Self-pay | Admitting: *Deleted

## 2014-03-10 NOTE — Telephone Encounter (Signed)
Pt called stating her pharmacist told her to notify her Doctor that the new medication that was to be started could cause possible drug interactions. Pt wants to know if it is OK to take all medications together since the pharmacist is questioning it.

## 2014-03-10 NOTE — Telephone Encounter (Addendum)
Called and left a message for pt to call back to discuss meds. In voice message instructed patient to take either Remeron or Zyprexa. She should pick one of the 2 meds and take that specific medication daily but not to take both medications daily.

## 2014-03-14 ENCOUNTER — Telehealth (HOSPITAL_COMMUNITY): Payer: Self-pay | Admitting: *Deleted

## 2014-03-14 DIAGNOSIS — F639 Impulse disorder, unspecified: Secondary | ICD-10-CM

## 2014-03-14 NOTE — Telephone Encounter (Signed)
Patient called this morning  03/14/14 @ 9:30am requesting a refill on Olanzapine 5mg  1tablet @ night.

## 2014-03-15 MED ORDER — OLANZAPINE 10 MG PO TABS: 10.0000 mg | ORAL_TABLET | Freq: Every day | ORAL | Status: DC

## 2014-03-15 NOTE — Telephone Encounter (Signed)
Called and left a voice message asking pt to call back to discuss meds.

## 2014-03-15 NOTE — Telephone Encounter (Signed)
Pt took Remeron for 1 day and felt anxiety the next day. She has since disposed of the medication. Pt is asking to do trial with increased dose of Zyprexa.  A/P:Impulse control disorder, Anorexia Nervosa, Cannabis dependence, Depressive d/o NOS, GAD  -start trial of Zyprexa 10mg  po qHS for mood stabalization, adjunct for depression and anxiety -Continue Lamictal 300mg  for mood stabalization  -d/c Remeron Pt understands she should take Remeron and Zyprexa and she has disposed of the Remeron.

## 2014-03-22 ENCOUNTER — Ambulatory Visit (HOSPITAL_COMMUNITY): Payer: Self-pay | Admitting: Psychiatry

## 2014-04-05 ENCOUNTER — Telehealth (HOSPITAL_COMMUNITY): Payer: Self-pay | Admitting: *Deleted

## 2014-04-05 NOTE — Telephone Encounter (Signed)
Pt asking for refill of Lamictal, explained that she had one on file to check with her pharmacy. Also states she has trouble concentrating and is asking if she needs to be diagnosed ADD. States she and the Dr. Discussed it during last visit and wanted her to be aware.

## 2014-04-06 ENCOUNTER — Telehealth (HOSPITAL_COMMUNITY): Payer: Self-pay

## 2014-04-07 NOTE — Telephone Encounter (Signed)
Called and voice message for call back.

## 2014-04-12 ENCOUNTER — Ambulatory Visit (INDEPENDENT_AMBULATORY_CARE_PROVIDER_SITE_OTHER): Payer: 59 | Admitting: Psychiatry

## 2014-04-12 DIAGNOSIS — F509 Eating disorder, unspecified: Secondary | ICD-10-CM

## 2014-04-12 DIAGNOSIS — F331 Major depressive disorder, recurrent, moderate: Secondary | ICD-10-CM

## 2014-04-12 NOTE — Telephone Encounter (Signed)
Called and left voice message for call back 

## 2014-04-14 ENCOUNTER — Encounter (HOSPITAL_COMMUNITY): Payer: Self-pay | Admitting: Psychiatry

## 2014-04-14 ENCOUNTER — Ambulatory Visit (INDEPENDENT_AMBULATORY_CARE_PROVIDER_SITE_OTHER): Payer: 59 | Admitting: Psychiatry

## 2014-04-14 VITALS — BP 101/62 | HR 89 | Ht 62.0 in | Wt 115.8 lb

## 2014-04-14 DIAGNOSIS — F32A Depression, unspecified: Secondary | ICD-10-CM

## 2014-04-14 DIAGNOSIS — F411 Generalized anxiety disorder: Secondary | ICD-10-CM

## 2014-04-14 DIAGNOSIS — F1220 Cannabis dependence, uncomplicated: Secondary | ICD-10-CM

## 2014-04-14 DIAGNOSIS — F639 Impulse disorder, unspecified: Secondary | ICD-10-CM

## 2014-04-14 DIAGNOSIS — R413 Other amnesia: Secondary | ICD-10-CM

## 2014-04-14 DIAGNOSIS — F5000 Anorexia nervosa, unspecified: Secondary | ICD-10-CM

## 2014-04-14 DIAGNOSIS — F329 Major depressive disorder, single episode, unspecified: Secondary | ICD-10-CM

## 2014-04-14 DIAGNOSIS — F3289 Other specified depressive episodes: Secondary | ICD-10-CM

## 2014-04-14 MED ORDER — OLANZAPINE 10 MG PO TABS: 10.0000 mg | ORAL_TABLET | Freq: Every day | ORAL | Status: DC

## 2014-04-14 MED ORDER — BUPROPION HCL ER (XL) 150 MG PO TB24: 150.0000 mg | ORAL_TABLET | Freq: Every day | ORAL | Status: DC

## 2014-04-14 MED ORDER — LAMOTRIGINE 150 MG PO TABS: 300.0000 mg | ORAL_TABLET | Freq: Every day | ORAL | Status: DC
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
## 2014-04-26 ENCOUNTER — Telehealth (HOSPITAL_COMMUNITY): Payer: Self-pay

## 2014-04-26 DIAGNOSIS — F639 Impulse disorder, unspecified: Secondary | ICD-10-CM

## 2014-04-26 NOTE — Telephone Encounter (Signed)
Pt may decrease to Zyprexa 5mg  po qHS and see if that helps the fog.

## 2014-04-27 ENCOUNTER — Ambulatory Visit (HOSPITAL_COMMUNITY)
Admission: RE | Admit: 2014-04-27 | Discharge: 2014-04-27 | Disposition: A | Discharge status: Home / Self Care | Payer: Medicare Other | Source: Ambulatory Visit | Attending: Psychiatry | Admitting: Psychiatry

## 2014-04-27 DIAGNOSIS — R413 Other amnesia: Secondary | ICD-10-CM | POA: Insufficient documentation

## 2014-04-27 MED ORDER — IOHEXOL 300 MG/ML  SOLN
100.0000 mL | Freq: Once | INTRAMUSCULAR | Status: AC | PRN
Start: 2014-04-27 — End: 2014-04-27
  Administered 2014-04-27: 100 mL via INTRAVENOUS

## 2014-04-29 NOTE — Telephone Encounter (Signed)
Pt c/o feeling "foggy", Dr. Landis Gandy had left message to decrease dosage of Zyprexat  to 5mg  qhs. Called MD to verify and she asked that this writer call pt pharmacy to request enough pills until her next appointment. 10mg  pills are not scored and patient was unable to cut in half. Pt was notified that new script is at her pharmacy.

## 2014-05-02 ENCOUNTER — Telehealth (HOSPITAL_COMMUNITY): Payer: Self-pay

## 2014-05-04 ENCOUNTER — Ambulatory Visit (INDEPENDENT_AMBULATORY_CARE_PROVIDER_SITE_OTHER): Payer: 59 | Admitting: Psychology

## 2014-05-04 DIAGNOSIS — F3289 Other specified depressive episodes: Secondary | ICD-10-CM

## 2014-05-04 DIAGNOSIS — F988 Other specified behavioral and emotional disorders with onset usually occurring in childhood and adolescence: Secondary | ICD-10-CM

## 2014-05-04 DIAGNOSIS — F329 Major depressive disorder, single episode, unspecified: Secondary | ICD-10-CM

## 2014-05-05 NOTE — Telephone Encounter (Signed)
Spoke with pt and informed pt that CT is normal. Pt states she had some testing for ADD and results came back "inconclusive" so she will be getting more testing in the near future.

## 2014-05-09 DIAGNOSIS — R1010 Upper abdominal pain, unspecified: Secondary | ICD-10-CM | POA: Insufficient documentation

## 2014-05-10 ENCOUNTER — Ambulatory Visit (HOSPITAL_COMMUNITY): Payer: Self-pay | Admitting: Psychiatry

## 2014-05-16 ENCOUNTER — Telehealth (HOSPITAL_COMMUNITY): Payer: Self-pay

## 2014-05-16 ENCOUNTER — Other Ambulatory Visit (HOSPITAL_COMMUNITY): Payer: Self-pay | Admitting: Psychiatry

## 2014-05-16 ENCOUNTER — Ambulatory Visit: Payer: Medicare Other | Attending: Physical Therapy | Admitting: Physical Therapy

## 2014-05-16 NOTE — Telephone Encounter (Signed)
D/C wellbutrin XL 150 MG due to lack of appetite. Pt reports she has eating disorder and is stable and so does not want to take anything which causes problems with her appetite

## 2014-05-17 NOTE — Telephone Encounter (Signed)
Called and left voice message for call back 

## 2014-05-18 ENCOUNTER — Ambulatory Visit: Payer: 59 | Admitting: Psychology

## 2014-05-18 ENCOUNTER — Ambulatory Visit: Payer: 59 | Admitting: Psychiatry

## 2014-06-07 ENCOUNTER — Ambulatory Visit (INDEPENDENT_AMBULATORY_CARE_PROVIDER_SITE_OTHER): Payer: 59 | Admitting: Psychiatry

## 2014-06-07 DIAGNOSIS — F509 Eating disorder, unspecified: Secondary | ICD-10-CM

## 2014-06-07 DIAGNOSIS — F332 Major depressive disorder, recurrent severe without psychotic features: Secondary | ICD-10-CM

## 2014-06-08 ENCOUNTER — Ambulatory Visit (INDEPENDENT_AMBULATORY_CARE_PROVIDER_SITE_OTHER): Payer: 59 | Admitting: Psychology

## 2014-06-08 DIAGNOSIS — F909 Attention-deficit hyperactivity disorder, unspecified type: Secondary | ICD-10-CM

## 2014-06-08 DIAGNOSIS — F4323 Adjustment disorder with mixed anxiety and depressed mood: Secondary | ICD-10-CM

## 2014-06-13 ENCOUNTER — Telehealth (HOSPITAL_COMMUNITY): Payer: Self-pay | Admitting: *Deleted

## 2014-06-13 NOTE — Telephone Encounter (Signed)
Patient left VM: She saw primary med MD today. Having problems with sleep and MD recommended she talk to psychiatrist since she is being treated for depression and it may be related

## 2014-06-14 NOTE — Telephone Encounter (Signed)
Pt is falling asleep at 7-8pm and waking up at 6am without interruption. States she would prefer to wake up at 8-9am. She takes Zyprexa at 5pm daily.  Pt was diagnosed with ADD after testing. She is interested in trying the patch to bypass the GI system. Pt will send over copy of the report.   A/P: Recommend pt push back Zyprexa dose to 6-7pm to see if that allows her to fall asleep later and wake up later. Pt verbalized understanding and stated she would try it.  Will discuss ADD at next scheduled appt.

## 2014-06-23 ENCOUNTER — Telehealth (HOSPITAL_COMMUNITY): Payer: Self-pay

## 2014-06-25 ENCOUNTER — Telehealth (HOSPITAL_COMMUNITY): Payer: Self-pay | Admitting: *Deleted

## 2014-06-25 DIAGNOSIS — F639 Impulse disorder, unspecified: Secondary | ICD-10-CM

## 2014-06-25 MED ORDER — OLANZAPINE 5 MG PO TABS
5.0000 mg | ORAL_TABLET | Freq: Every day | ORAL | Status: DC
Start: 2014-06-25 — End: 2014-07-14

## 2014-06-25 MED ORDER — OLANZAPINE 10 MG PO TABS: 10.0000 mg | ORAL_TABLET | Freq: Every day | ORAL | Status: DC

## 2014-06-25 NOTE — Telephone Encounter (Signed)
Pt called requesting refill of Olanzapine 5mg . Chart reflected 10mg  dosage. Note dated 9/25 has dosage change to 5mg . Pt states she has been cutting her 10mg  pills in half since Sept as they make her too sleepy. Pt did not show for her appointment in October. She wants refill to reflect the 5mg  dosage so she does not need to cut in half. Does not have enough until her next appointment in Dec. Chart reviewed, refill appropriate.

## 2014-06-28 ENCOUNTER — Telehealth (HOSPITAL_COMMUNITY): Payer: Self-pay

## 2014-06-28 DIAGNOSIS — F331 Major depressive disorder, recurrent, moderate: Secondary | ICD-10-CM

## 2014-06-28 MED ORDER — SERTRALINE HCL 50 MG PO TABS: 50.0000 mg | ORAL_TABLET | Freq: Every day | ORAL | Status: DC

## 2014-06-28 NOTE — Telephone Encounter (Signed)
Called and left a voice message for call back.   

## 2014-06-28 NOTE — Telephone Encounter (Signed)
Pt states she is having a lot of depression. She is no longer on Wellbutrin b/c it was decreasing her appetite. States she is having sad mood, irritability and a lot of anxiety. Pt is taking Lamictal and Zyprexa.   A/P: Impulse control disorder, Anorexia Nervosa, Cannabis dependence, Depressive d/o NOS, GAD, r/o ADD 1. Start trial of Zoloft 50mg  po qD for depression 2. Continue Lamictal 3. Continue Zyprexa.

## 2014-07-04 ENCOUNTER — Other Ambulatory Visit (HOSPITAL_COMMUNITY): Payer: Self-pay | Admitting: Psychiatry

## 2014-07-05 ENCOUNTER — Ambulatory Visit (INDEPENDENT_AMBULATORY_CARE_PROVIDER_SITE_OTHER): Payer: 59 | Admitting: Psychiatry

## 2014-07-05 DIAGNOSIS — F329 Major depressive disorder, single episode, unspecified: Secondary | ICD-10-CM

## 2014-07-05 DIAGNOSIS — F909 Attention-deficit hyperactivity disorder, unspecified type: Secondary | ICD-10-CM

## 2014-07-14 ENCOUNTER — Ambulatory Visit (INDEPENDENT_AMBULATORY_CARE_PROVIDER_SITE_OTHER): Payer: 59 | Admitting: Psychiatry

## 2014-07-14 ENCOUNTER — Telehealth (HOSPITAL_COMMUNITY): Payer: Self-pay

## 2014-07-14 ENCOUNTER — Encounter (HOSPITAL_COMMUNITY): Payer: Self-pay | Admitting: Psychiatry

## 2014-07-14 VITALS — BP 113/74 | HR 86 | Ht 62.0 in | Wt 108.8 lb

## 2014-07-14 DIAGNOSIS — G4700 Insomnia, unspecified: Secondary | ICD-10-CM

## 2014-07-14 DIAGNOSIS — F411 Generalized anxiety disorder: Secondary | ICD-10-CM

## 2014-07-14 DIAGNOSIS — F639 Impulse disorder, unspecified: Secondary | ICD-10-CM | POA: Insufficient documentation

## 2014-07-14 DIAGNOSIS — F5000 Anorexia nervosa, unspecified: Secondary | ICD-10-CM

## 2014-07-14 DIAGNOSIS — F900 Attention-deficit hyperactivity disorder, predominantly inattentive type: Secondary | ICD-10-CM | POA: Insufficient documentation

## 2014-07-14 DIAGNOSIS — F331 Major depressive disorder, recurrent, moderate: Secondary | ICD-10-CM | POA: Insufficient documentation

## 2014-07-14 MED ORDER — METHYLPHENIDATE 10 MG/9HR TD PTCH: 10.0000 mg | MEDICATED_PATCH | Freq: Every day | TRANSDERMAL | Status: DC

## 2014-07-14 MED ORDER — BUSPIRONE HCL 10 MG PO TABS: 10.0000 mg | ORAL_TABLET | Freq: Every day | ORAL | Status: DC

## 2014-07-14 MED ORDER — OLANZAPINE 5 MG PO TABS: 5.0000 mg | ORAL_TABLET | Freq: Every day | ORAL | Status: DC

## 2014-07-14 MED ORDER — LAMOTRIGINE 150 MG PO TABS: 300.0000 mg | ORAL_TABLET | Freq: Every day | ORAL | Status: DC
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
## 2014-07-14 NOTE — Telephone Encounter (Signed)
Pt states pharmacy advised we call provider line of her insurance company to get Daytrona patch approved.

## 2014-07-19 ENCOUNTER — Ambulatory Visit (INDEPENDENT_AMBULATORY_CARE_PROVIDER_SITE_OTHER): Payer: 59 | Admitting: Psychiatry

## 2014-07-19 DIAGNOSIS — F909 Attention-deficit hyperactivity disorder, unspecified type: Secondary | ICD-10-CM

## 2014-07-19 DIAGNOSIS — F329 Major depressive disorder, single episode, unspecified: Secondary | ICD-10-CM

## 2014-07-20 ENCOUNTER — Encounter (HOSPITAL_COMMUNITY): Payer: Self-pay | Admitting: *Deleted

## 2014-07-20 NOTE — Progress Notes (Signed)
Initiated Prior Authorization for [employer] Per rep at [employer], not on formulary Gave information regarding diagnosis and previous meds tried (none noted in chart) Information taken - per rep, will be sent to pharmacist for review. Result will be faxed to office  Notified patient of initiation of PA

## 2014-07-21 NOTE — Telephone Encounter (Signed)
Pt needs Nature conservation officer for [employer]. We can not use oral stimulants b/c pt has anorexia and stimulants decrease appetite.

## 2014-07-22 ENCOUNTER — Telehealth (HOSPITAL_COMMUNITY): Payer: Self-pay | Admitting: *Deleted

## 2014-07-22 NOTE — Telephone Encounter (Signed)
Called for prior authorization of Daytrana spoke to [place] who states we should hear back within 24 hours of decision. Explained need for Daytrana due to other medications causing appetite suppression and diagnosis of anorexia.

## 2014-07-25 ENCOUNTER — Telehealth (HOSPITAL_COMMUNITY): Payer: Self-pay | Admitting: *Deleted

## 2014-07-25 NOTE — Telephone Encounter (Signed)
Prior Authorization for Daytrana denied

## 2014-07-26 ENCOUNTER — Encounter (HOSPITAL_COMMUNITY): Payer: Self-pay | Admitting: Psychiatry

## 2014-07-26 ENCOUNTER — Ambulatory Visit (INDEPENDENT_AMBULATORY_CARE_PROVIDER_SITE_OTHER): Payer: 59 | Admitting: Psychiatry

## 2014-07-26 VITALS — BP 112/63 | HR 89 | Ht 62.0 in | Wt 107.2 lb

## 2014-07-26 DIAGNOSIS — F639 Impulse disorder, unspecified: Secondary | ICD-10-CM

## 2014-07-26 DIAGNOSIS — F331 Major depressive disorder, recurrent, moderate: Secondary | ICD-10-CM

## 2014-07-26 DIAGNOSIS — F5000 Anorexia nervosa, unspecified: Secondary | ICD-10-CM

## 2014-07-26 DIAGNOSIS — F411 Generalized anxiety disorder: Secondary | ICD-10-CM

## 2014-07-26 DIAGNOSIS — F900 Attention-deficit hyperactivity disorder, predominantly inattentive type: Secondary | ICD-10-CM

## 2014-07-26 DIAGNOSIS — G4700 Insomnia, unspecified: Secondary | ICD-10-CM

## 2014-07-26 MED ORDER — OLANZAPINE 7.5 MG PO TABS: 7.5000 mg | ORAL_TABLET | Freq: Every day | ORAL | Status: DC

## 2014-07-26 MED ORDER — LAMOTRIGINE 150 MG PO TABS: 300.0000 mg | ORAL_TABLET | Freq: Every day | ORAL | Status: DC

## 2014-07-26 MED ORDER — BUSPIRONE HCL 10 MG PO TABS: 10.0000 mg | ORAL_TABLET | Freq: Every day | ORAL | Status: DC

## 2014-07-26 MED ORDER — SERTRALINE HCL 50 MG PO TABS: 50.0000 mg | ORAL_TABLET | Freq: Every day | ORAL | Status: DC

## 2014-07-26 NOTE — Progress Notes (Signed)
Patient ID: Karen Hahn, female   DOB: Jan 24, 1976, 38 y.o.   MRN: [id number]  [facility] [id number] Progress Note  Bradleigh Sonnen [id number] 38 y.o.  07/26/2014 2:02 PM  Chief Complaint: "I am not sure what to do"  History of Present Illness: Here with best friend who pt gave permission to stay and share information.  Pt doesn't want to try to any stimulants at this time b/c of appetite suppression. Daytrana patch was denied.  Pt states depression is really bad. Pt is irritable and snapping at everyone. She is isolating, anhedonia, sad mood.  Pt is sleeping about 5-6 hrs/night. Energy is low.   Pt took 11 Flexaril within 2 hrs the other day b/c she wanted to feel better. Pt denies it was suicide attempt. States she wanted to get high so she could feel good. Roommate is now administrating the Flexaril to the pt.  Skin picking is getting worse but is unable to stop. Pt's face is red and she has open sores all over her face.   Anxiety is bad and she is anxious all the time. .   Zyprexa is helping a lot. She is able to control her anger and irritability.  Appetite is fair and she is eating appropriate size meals. Reports she is now eating 2-3 meals a day. She is no longer eating snacks thru out the day. Pt is eating a decent portion each meal. Pt states THC helps to increase her appetite. She states weight is 108 lbs today. Body image remains low.   States she is taking Lamictal and  Zyprexa and Zoloft as prescribed and denies SE.  Suicidal Ideation: No Plan Formed: No Patient has means to carry out plan: No  Homicidal Ideation: No Plan Formed: No Patient has means to carry out plan: No  Review of Systems: Psychiatric: Agitation: Yes Hallucination: No Depressed Mood: Yes Insomnia: Yes Hypersomnia: No Altered Concentration: Yes Feels Worthless: Yes Grandiose Ideas: No Belief In Special Powers: No New/Increased Substance Abuse: No Compulsions:  Yes  Neurologic: Headache: No Seizure: No Paresthesias: No   Review of Systems  Constitutional: Positive for weight loss. Negative for fever and chills.  HENT: Negative for congestion, ear pain and sore throat.   Eyes: Negative for blurred vision, double vision and redness.  Respiratory: Negative for cough, sputum production and shortness of breath.   Cardiovascular: Negative for chest pain, palpitations and leg swelling.  Gastrointestinal: Negative for heartburn, nausea, vomiting, abdominal pain and diarrhea.  Musculoskeletal: Negative for back pain, joint pain and neck pain.  Skin: Negative for itching and rash.  Neurological: Negative for dizziness, tingling, seizures, weakness and headaches.  Psychiatric/Behavioral: Positive for depression and memory loss. Negative for suicidal ideas, hallucinations and substance abuse. The patient is nervous/anxious and has insomnia.      Past Medical, Family, Social History: Endorsing family hx of depression, ADD, alcohol abuse and anxiety disorder. Pt is living with her friend in [place], [place]. She is divorced and was married for 5 yrs. Pt has a GED. Currently unemployed. Pt smokes a few cigs a week. She smokes THC thru the day. Denies alcohol use.   Past Medical History  Diagnosis Date  . SMAS (superior mesenteric artery syndrome)   . History of borderline personality disorder   . GERD (gastroesophageal reflux disease)   . Hypotension   . Osteopenia   . Gastric ulcer due to Helicobacter pylori   . Anorexia nervosa without bulimia     Outpatient Encounter Prescriptions  as of 07/26/2014  Medication Sig  . busPIRone (BUSPAR) 10 MG tablet Take 1 tablet (10 mg total) by mouth at bedtime.  . Calcium-Vitamin D-Vitamin K 500-100-40 MG-UNT-MCG CHEW Chew 1 tablet by mouth 2 (two) times daily.  . cholecalciferol (VITAMIN D) 1000 UNITS tablet Take 2,000 Units by mouth daily.  Marland Kitchen dicyclomine (BENTYL) 10 MG/5ML syrup Take 10 mg by mouth 3 (three)  times daily before meals.  . lamoTRIgine (LAMICTAL) 150 MG tablet Take 2 tablets (300 mg total) by mouth daily.  . methylphenidate (DAYTRANA) 10 mg/9hr patch Place 1 patch (10 mg total) onto the skin daily. wear patch for 9 hours only each day  . Multiple Vitamins-Minerals (MULTIVITAL PO) Take 1 tablet by mouth 2 (two) times daily.  . Norgestimate-Ethinyl Estradiol Triphasic (ORTHO TRI-CYCLEN, 28,) 0.18/0.215/0.25 MG-35 MCG tablet Take 1 tablet by mouth daily.  Marland Kitchen OLANZapine (ZYPREXA) 5 MG tablet Take 1 tablet (5 mg total) by mouth at bedtime.  Marland Kitchen omeprazole (PRILOSEC) 40 MG capsule Take 40 mg by mouth daily.  . ondansetron (ZOFRAN) 8 MG tablet Take by mouth 3 (three) times daily as needed for nausea or vomiting.  . ranitidine (ZANTAC) 150 MG tablet Take 150 mg by mouth 2 (two) times daily.  . sertraline (ZOLOFT) 50 MG tablet Take 1 tablet (50 mg total) by mouth daily.    Past Psychiatric History/Hospitalization(s): Anxiety: Yes Bipolar Disorder: No Depression: Yes Mania: No Psychosis: No Schizophrenia: No Personality Disorder: No Hospitalization for psychiatric illness: Yes numerous- 2 intensive eating program treatments in 2013 and 2014. BH eating d/o with Dr. Orland Dec Nov 2011. Multiple inpt hospitalizations at [facility] (2011, 2011, 2005), [place] (3x), [facility], [place], [place] (06/2013).  History of Electroconvulsive Shock Therapy: No Prior Suicide Attempts: No  Physical Exam: Constitutional:  BP 112/63 mmHg  Pulse 89  Ht 5\' 2"  (1.575 m)  Wt 107 lb 3.2 oz (48.626 kg)  BMI 19.60 kg/m2  General Appearance: alert, oriented, no acute distress  Musculoskeletal: Strength & Muscle Tone: within normal limits Gait & Station: normal Patient leans: N/A  Mental Status Examination/Evaluation: Objective: Attitude: Calm and cooperative  Appearance:  Fairly Groomed and Neat, thin. visible scars and scabs all over face. Appears older than stated age.   Eye Contact::  Good  Speech:  Clear  and Coherent and Normal Rate  Volume:  Normal  Mood:  Depressed  Affect:  Congruent  Thought Process:  Goal Directed, Linear and Logical  Orientation:  Full (Time, Place, and Person)  Thought Content:  WDL  Suicidal Thoughts:  No  Homicidal Thoughts:  No  Judgement:  Fair  Insight:  Fair  Concentration: fair  Memory: Immediate-poor Recent-poor Remote-fair  Recall: fair  Language: fair  Gait and Station: normal  [id number] of Knowledge: average  Psychomotor Activity:  Normal  Akathisia:  No  Handed:  Right     Medical Decision Making (Choose Three): Review of Psycho-Social Stressors (1), Established Problem, Worsening (2), Review of Medication Regimen & Side Effects (2) and Review of New Medication or Change in Dosage (2)  Assessment: AXIS I  Axis I: Impulse control disorder, Anorexia Nervosa, Cannabis dependence, MDD- moderate, recurrent,  GAD, ADD- inattentive type  AXIS II  Deferred   AXIS III  Past Medical History    Diagnosis  Date    .  SMAS (superior mesenteric artery syndrome)     .  History of borderline personality disorder     .  GERD (gastroesophageal reflux disease)     .  Hypotension     .  Osteopenia     .  Gastric ulcer due to Helicobacter pylori    AXIS IV  other psychosocial or environmental problems and problems with primary support group   AXIS V  51-60 moderate symptoms       Treatment Plan/Recommendations:  Plan of Care:   Medication management with supportive therapy. Risks/benefits and SE of the medication discussed. Pt verbalized understanding and verbal consent obtained for treatment.  Affirm with the patient that the medications are taken as ordered. Patient expressed understanding of how their medications were to be used.  -worsening of symptoms   Laboratory: none at this time  Psychotherapy:Therapy: brief supportive therapy provided. Discussed psychosocial stressors in detail.      Medications:   -Continue Lamictal 300mg  for mood  stabalization -increase Zyprexa to 7.5mg  po qHS for mood stabalization, adjunct for depression and anxiety - continue Zoloft 50mg  po qD for mood -Daytrana Patch was denied - Buspar 10mg  po qHS for sleep - avoid stimulants if possible due to appetite suppression    Routine PRN Medications: No  Consultations: encouraged pt to attend therapy with Dr.   Safety Concerns: Pt denies SI and is at an acute low risk for suicide.Patient told to call clinic if any problems occur. Patient advised to go to ER  if she should develop SI/HI, side effects, or if symptoms worsen. Has crisis numbers to call if needed. Pt verbalized understanding.   Other: F/up in 2 months or sooner if needed      , MD 07/26/2014

## 2014-08-02 ENCOUNTER — Ambulatory Visit (HOSPITAL_COMMUNITY): Payer: Self-pay | Admitting: Psychiatry

## 2014-08-09 ENCOUNTER — Ambulatory Visit (INDEPENDENT_AMBULATORY_CARE_PROVIDER_SITE_OTHER): Payer: 59 | Admitting: Psychiatry

## 2014-08-09 DIAGNOSIS — F909 Attention-deficit hyperactivity disorder, unspecified type: Secondary | ICD-10-CM

## 2014-08-09 DIAGNOSIS — F329 Major depressive disorder, single episode, unspecified: Secondary | ICD-10-CM

## 2014-08-16 ENCOUNTER — Telehealth (HOSPITAL_COMMUNITY): Payer: Self-pay

## 2014-08-16 NOTE — Telephone Encounter (Signed)
Patient calling to request restart of Remeron per suggestion of her primary care provider.  Would like to discuss.

## 2014-08-17 NOTE — Telephone Encounter (Signed)
Dr. Michae Kava, patient called again questioning if she could retry Remeron for sleep as she spoke with her primary care provider and now thinks she should give it another try.  States she only took one day prior and reported it made her anxious but now thinks she should have tried the medication longer.  Patient's next appointment 09/27/14.

## 2014-08-18 NOTE — Telephone Encounter (Signed)
Pt has been having a lot trouble sleeping. PCP suggested she restart Remeron. Pt was reminded that due to drug-drug interactions b/w Zyprexa and Remeron she was not prescribed both meds. Pt did recall that and stated that she has an appt with a sleep specialist at the end of the month. No new meds prescribed today.

## 2014-08-19 ENCOUNTER — Other Ambulatory Visit (HOSPITAL_COMMUNITY): Payer: Self-pay

## 2014-08-19 NOTE — Telephone Encounter (Signed)
Patient left a message requesting to restart Ativan she has taken in the past.  Reported some increased anxiety and requested Dr. Kendrick Fries consider restart.  Last appointment 07/26/14 and next appointment 09/27/14.

## 2014-08-25 ENCOUNTER — Ambulatory Visit (HOSPITAL_COMMUNITY): Payer: Self-pay | Admitting: Psychiatry

## 2014-09-06 ENCOUNTER — Ambulatory Visit: Payer: 59 | Admitting: Psychiatry

## 2014-09-06 NOTE — Telephone Encounter (Signed)
Called home number- voice mail box not set up   Called cell phone- disconnected or changed.

## 2014-09-13 ENCOUNTER — Ambulatory Visit
Admission: RE | Admit: 2014-09-13 | Discharge: 2014-09-13 | Disposition: A | Discharge status: Home / Self Care | Payer: Medicare Other | Source: Ambulatory Visit | Attending: Family Medicine | Admitting: Family Medicine

## 2014-09-13 ENCOUNTER — Other Ambulatory Visit: Payer: Self-pay | Admitting: Family Medicine

## 2014-09-13 DIAGNOSIS — R61 Generalized hyperhidrosis: Secondary | ICD-10-CM

## 2014-09-15 ENCOUNTER — Ambulatory Visit (HOSPITAL_COMMUNITY): Payer: Self-pay | Admitting: Psychiatry

## 2014-09-20 ENCOUNTER — Telehealth (HOSPITAL_COMMUNITY): Payer: Self-pay

## 2014-09-20 DIAGNOSIS — F639 Impulse disorder, unspecified: Secondary | ICD-10-CM

## 2014-09-20 NOTE — Telephone Encounter (Signed)
Patient reported having enough of all medications except Zyprexa 7.5mg  one at bedtime.  Patient reported having enough pills to last until the 20th but not until her appointment on the 23rd of February.  Patient would like a refill to be sent in to [facility], store # [phone].

## 2014-09-20 NOTE — Telephone Encounter (Signed)
Yes lets wait to see if she has enough for appt

## 2014-09-20 NOTE — Telephone Encounter (Signed)
Message left for refill of prescribed medications.  Attempted to reach patient back and left a phone message on patient's voicemail to question which medications needed refilling and if patient had enough of all medications until appointment 09/27/14 as last orders completed 07/26/14 with 1 refill each.  Will await patient follow up to see if refills needed at this time?

## 2014-09-21 ENCOUNTER — Ambulatory Visit (INDEPENDENT_AMBULATORY_CARE_PROVIDER_SITE_OTHER): Payer: 59 | Admitting: Psychiatry

## 2014-09-21 DIAGNOSIS — F329 Major depressive disorder, single episode, unspecified: Secondary | ICD-10-CM

## 2014-09-21 DIAGNOSIS — F909 Attention-deficit hyperactivity disorder, unspecified type: Secondary | ICD-10-CM

## 2014-09-22 MED ORDER — OLANZAPINE 7.5 MG PO TABS: 7.5000 mg | ORAL_TABLET | Freq: Every day | ORAL | Status: DC

## 2014-09-22 NOTE — Telephone Encounter (Signed)
Met with Dr. Doyne Keel to discuss patient's request for a partial refill of Zyprexa to last until appointment scheduled 09/27/14.  Dr. Doyne Keel authorized a 5 day refill and e-scribed refill order to [place] at [facility].  Telephone call with patient to inform of e-scribed over 5 days supply of her Zyperxa 7.$RemoveBefor'5mg'[id number]$  tablets.  Patient will pick up today and will seen next week.

## 2014-09-22 NOTE — Telephone Encounter (Signed)
Lets do a partial refill

## 2014-09-27 ENCOUNTER — Ambulatory Visit (INDEPENDENT_AMBULATORY_CARE_PROVIDER_SITE_OTHER): Payer: Self-pay | Admitting: Psychiatry

## 2014-09-27 ENCOUNTER — Encounter (HOSPITAL_COMMUNITY): Payer: Self-pay | Admitting: Psychiatry

## 2014-09-27 VITALS — BP 112/75 | HR 105 | Ht 62.0 in | Wt 108.0 lb

## 2014-09-27 DIAGNOSIS — F900 Attention-deficit hyperactivity disorder, predominantly inattentive type: Secondary | ICD-10-CM

## 2014-09-27 DIAGNOSIS — F988 Other specified behavioral and emotional disorders with onset usually occurring in childhood and adolescence: Secondary | ICD-10-CM

## 2014-09-27 DIAGNOSIS — F1210 Cannabis abuse, uncomplicated: Secondary | ICD-10-CM

## 2014-09-27 DIAGNOSIS — F411 Generalized anxiety disorder: Secondary | ICD-10-CM

## 2014-09-27 DIAGNOSIS — F639 Impulse disorder, unspecified: Secondary | ICD-10-CM

## 2014-09-27 DIAGNOSIS — G4700 Insomnia, unspecified: Secondary | ICD-10-CM

## 2014-09-27 DIAGNOSIS — F5000 Anorexia nervosa, unspecified: Secondary | ICD-10-CM

## 2014-09-27 DIAGNOSIS — F331 Major depressive disorder, recurrent, moderate: Secondary | ICD-10-CM

## 2014-09-27 MED ORDER — BUSPIRONE HCL 10 MG PO TABS: 10.0000 mg | ORAL_TABLET | Freq: Two times a day (BID) | ORAL | Status: DC

## 2014-09-27 MED ORDER — OLANZAPINE 7.5 MG PO TABS: 7.5000 mg | ORAL_TABLET | Freq: Every day | ORAL | Status: DC

## 2014-09-27 MED ORDER — LAMOTRIGINE 150 MG PO TABS: 300.0000 mg | ORAL_TABLET | Freq: Every day | ORAL | Status: DC

## 2014-09-27 MED ORDER — SERTRALINE HCL 100 MG PO TABS: 100.0000 mg | ORAL_TABLET | Freq: Every day | ORAL | Status: DC

## 2014-09-27 NOTE — Progress Notes (Signed)
[facility] [id number] Progress Note  Karen Hahn [id number] 39 y.o.  09/27/2014 1:34 PM  Chief Complaint: "very anxious"  History of Present Illness: Anxiety is worse. She is picking numerous times a day. She will have anxiety all thru out the day. Asking to start a benzo.    Pt is trying to get on a healthy sleeping regime. Pt is supposed to sleep at 10pm. Roommate is trying to help her.   Pt is argumentative and stuborn especially with her rooommate. Pt will yell and throw things when upset. Later pt understands her behavior was childish but feels it is hard to control it in the moment.   Pt states depression is better and she is happier. She denies isolating, anhedonia, sad mood, hopelessness and worthlessness.  Pt is sleeping about 5-6 hrs/night but is waking up with night sweats every night. Pt saw a sleep specialist and does not have sleep apnea. Reports no med recommendations were made. Pt had a chest xray and it showed mild hyper inflation. Pt will f/up with PCP. Energy is low.   Zyprexa is helping a lot. She is able to control her anger and irritability.  Appetite is fair and she is eating appropriate size meals. Reports she is now eating 2-3 meals a day. She is eating snacks thru out the day. Pt is eating a decent portion each meal. Pt states THC helps to increase her appetite. She states weight is 108 lbs today. Body image remains low.   States she is taking Lamictal and  Zyprexa and Zoloft as prescribed and denies SE.  Suicidal Ideation: No Plan Formed: No Patient has means to carry out plan: No  Homicidal Ideation: No Plan Formed: No Patient has means to carry out plan: No  Review of Systems: Psychiatric: Agitation: Yes Hallucination: No Depressed Mood: No Insomnia: Yes Hypersomnia: No Altered Concentration: Yes Feels Worthless: No Grandiose Ideas: No Belief In Special Powers: No New/Increased Substance Abuse: No Compulsions:  Yes  Neurologic: Headache: No Seizure: No Paresthesias: No   Review of Systems  Constitutional: Negative for fever and chills.  HENT: Negative for congestion, nosebleeds and sore throat.   Eyes: Negative for blurred vision, double vision and redness.  Respiratory: Positive for cough. Negative for sputum production and shortness of breath.   Cardiovascular: Negative for chest pain, palpitations and leg swelling.  Gastrointestinal: Negative for heartburn, nausea, vomiting and abdominal pain.  Musculoskeletal: Negative for myalgias, back pain, joint pain and neck pain.  Skin: Negative for itching and rash.  Neurological: Negative for dizziness, tingling, tremors, seizures, loss of consciousness, weakness and headaches.  Psychiatric/Behavioral: Negative for depression, suicidal ideas, hallucinations and substance abuse. The patient is nervous/anxious and has insomnia.      Past Medical, Family, Social History: Endorsing family hx of depression, ADD, alcohol abuse and anxiety disorder. Pt is living with her friend in [place], [place]. She is divorced and was married for 5 yrs. Pt has a GED. Currently unemployed. Pt smokes a few cigs a week. She smokes THC thru the day. Denies alcohol use.   Past Medical History  Diagnosis Date  . SMAS (superior mesenteric artery syndrome)   . History of borderline personality disorder   . GERD (gastroesophageal reflux disease)   . Hypotension   . Osteopenia   . Gastric ulcer due to Helicobacter pylori   . Anorexia nervosa without bulimia     Outpatient Encounter Prescriptions as of 09/27/2014  Medication Sig  . busPIRone (BUSPAR) 10 MG  tablet Take 1 tablet (10 mg total) by mouth at bedtime.  . cholecalciferol (VITAMIN D) 1000 UNITS tablet Take 2,000 Units by mouth daily.  Marland Kitchen dicyclomine (BENTYL) 10 MG/5ML syrup Take 10 mg by mouth 3 (three) times daily before meals.  . lamoTRIgine (LAMICTAL) 150 MG tablet Take 2 tablets (300 mg total) by mouth daily.   Marland Kitchen OLANZapine (ZYPREXA) 7.5 MG tablet Take 1 tablet (7.5 mg total) by mouth at bedtime.  Marland Kitchen omeprazole (PRILOSEC) 40 MG capsule Take 40 mg by mouth daily.  . ondansetron (ZOFRAN) 8 MG tablet Take by mouth 3 (three) times daily as needed for nausea or vomiting.  . ranitidine (ZANTAC) 150 MG tablet Take 150 mg by mouth 2 (two) times daily.  . sertraline (ZOLOFT) 50 MG tablet Take 1 tablet (50 mg total) by mouth daily.  . Calcium-Vitamin D-Vitamin K 500-100-40 MG-UNT-MCG CHEW Chew 1 tablet by mouth 2 (two) times daily.  . Multiple Vitamins-Minerals (MULTIVITAL PO) Take 1 tablet by mouth 2 (two) times daily.  . Norgestimate-Ethinyl Estradiol Triphasic (ORTHO TRI-CYCLEN, 28,) 0.18/0.215/0.25 MG-35 MCG tablet Take 1 tablet by mouth daily.    Past Psychiatric History/Hospitalization(s): Anxiety: Yes Bipolar Disorder: No Depression: Yes Mania: No Psychosis: No Schizophrenia: No Personality Disorder: No Hospitalization for psychiatric illness: Yes numerous- 2 intensive eating program treatments in 2013 and 2014. BH eating d/o with Dr. Orland Dec Nov 2011. Multiple inpt hospitalizations at [facility] (2011, 2011, 2005), [place] (3x), [facility], [place], [place] (06/2013).  History of Electroconvulsive Shock Therapy: No Prior Suicide Attempts: No  Physical Exam: Constitutional:  BP 112/75 mmHg  Pulse 105  Ht 5\' 2"  (1.575 m)  Wt 108 lb (48.988 kg)  BMI 19.75 kg/m2  LMP 09/20/2014  General Appearance: alert, oriented, no acute distress  Musculoskeletal: Strength & Muscle Tone: within normal limits Gait & Station: normal Patient leans: N/A  Mental Status Examination/Evaluation: Objective: Attitude: Calm and cooperative  Appearance:  Fairly Groomed and Neat, thin. visible scars and scabs all over face. Appears older than stated age.   Eye Contact::  Good  Speech:  Clear and Coherent and Normal Rate  Volume:  Normal  Mood:  Depressed  Affect:  Congruent  Thought Process:  Goal Directed,  Linear and Logical  Orientation:  Full (Time, Place, and Person)  Thought Content:  WDL  Suicidal Thoughts:  No  Homicidal Thoughts:  No  Judgement:  Fair  Insight:  Fair  Concentration: fair  Memory: Immediate-poor Recent-poor Remote-fair  Recall: fair  Language: fair  Gait and Station: normal  [id number] of Knowledge: average  Psychomotor Activity:  Normal  Akathisia:  No  Handed:  Right     Medical Decision Making (Choose Three): Established Problem, Stable/Improving (1), Review of Psycho-Social Stressors (1), Established Problem, Worsening (2), Review of Medication Regimen & Side Effects (2) and Review of New Medication or Change in Dosage (2)  Assessment: AXIS I  Axis I: Impulse control disorder, Anorexia Nervosa, Cannabis dependence, MDD- moderate, recurrent,  GAD, ADD- inattentive type  AXIS II  Deferred   AXIS III  Past Medical History    Diagnosis  Date    .  SMAS (superior mesenteric artery syndrome)     .  History of borderline personality disorder     .  GERD (gastroesophageal reflux disease)     .  Hypotension     .  Osteopenia     .  Gastric ulcer due to Helicobacter pylori    AXIS IV  other psychosocial or  environmental problems and problems with primary support group   AXIS V  51-60 moderate symptoms       Treatment Plan/Recommendations:  Plan of Care:   Medication management with supportive therapy. Risks/benefits and SE of the medication discussed. Pt verbalized understanding and verbal consent obtained for treatment.  Affirm with the patient that the medications are taken as ordered. Patient expressed understanding of how their medications were to be used.  -worsening of anxiety symptoms   Laboratory: none at this time  Psychotherapy:Therapy: brief supportive therapy provided. Discussed psychosocial stressors in detail.      Medications:   -Continue Lamictal 300mg  for mood stabalization - Zyprexa to 7.5mg  po qHS for mood stabalization, adjunct for  depression and anxiety - increase Zoloft to 100mg  po qD for mood - increase Buspar 10mg  po BID  for sleep - avoid stimulants if possible due to appetite suppression  -declined benzo request as pt would benefit more for from therapy   Routine PRN Medications: No  Consultations: encouraged pt to attend therapy with Dr.   Safety Concerns: Pt denies SI and is at an acute low risk for suicide.Patient told to call clinic if any problems occur. Patient advised to go to ER  if she should develop SI/HI, side effects, or if symptoms worsen. Has crisis numbers to call if needed. Pt verbalized understanding.   Other: F/up in 2 months or sooner if needed      , MD 09/27/2014

## 2014-10-18 ENCOUNTER — Ambulatory Visit (INDEPENDENT_AMBULATORY_CARE_PROVIDER_SITE_OTHER): Payer: 59 | Admitting: Psychiatry

## 2014-10-18 DIAGNOSIS — F909 Attention-deficit hyperactivity disorder, unspecified type: Secondary | ICD-10-CM | POA: Diagnosis not present

## 2014-10-18 DIAGNOSIS — F329 Major depressive disorder, single episode, unspecified: Secondary | ICD-10-CM | POA: Diagnosis not present

## 2014-11-15 ENCOUNTER — Encounter: Payer: Self-pay | Admitting: Emergency Medicine

## 2014-11-15 ENCOUNTER — Encounter (INDEPENDENT_AMBULATORY_CARE_PROVIDER_SITE_OTHER): Payer: Self-pay

## 2014-11-15 ENCOUNTER — Ambulatory Visit (INDEPENDENT_AMBULATORY_CARE_PROVIDER_SITE_OTHER): Payer: Medicare Other | Admitting: Emergency Medicine

## 2014-11-15 VITALS — BP 100/68 | HR 88 | Ht 62.0 in | Wt 113.0 lb

## 2014-11-15 DIAGNOSIS — R059 Cough, unspecified: Secondary | ICD-10-CM | POA: Insufficient documentation

## 2014-11-15 DIAGNOSIS — R05 Cough: Secondary | ICD-10-CM

## 2014-11-15 MED ORDER — ALBUTEROL SULFATE HFA 108 (90 BASE) MCG/ACT IN AERS: 2.0000 | INHALATION_SPRAY | Freq: Four times a day (QID) | RESPIRATORY_TRACT | Status: DC | PRN

## 2014-11-15 NOTE — Progress Notes (Signed)
Subjective:    Patient ID: Karen Hahn, female    DOB: [birth year], 39 y.o.   MRN: [id number]  HPI 39 yo woman, former cigarette smoker, current THC user, hx anorexia, GERD, borderline personality d/o. She presents today for evaluation of cough and SOB. She had a CXR that showed some possible hyperinflation in Feb '16.  She has had cough since about 5 months ago after a URI. The congestion got better but her cough persisted. Non productive. Seems to be worse with exercise. She was given ProAir respi-click 4 x a day, not much better. She also has SOB, can be brought on by singing, activity, anxiety.    She has been having night sweats for the last 6 months.  Has a family hx of COPD, father did not smoke. Mother did. Maternal GF had lung CA.    Review of Systems  Constitutional: Negative for fever and unexpected weight change.  HENT: Positive for congestion. Negative for dental problem, ear pain, nosebleeds, postnasal drip, rhinorrhea, sinus pressure, sneezing, sore throat and trouble swallowing.   Eyes: Negative for redness and itching.  Respiratory: Positive for cough and shortness of breath. Negative for chest tightness and wheezing.   Cardiovascular: Negative for palpitations and leg swelling.  Gastrointestinal: Negative for nausea and vomiting.  Genitourinary: Negative for dysuria.  Musculoskeletal: Negative for joint swelling.  Skin: Negative for rash.  Neurological: Negative for headaches.  Hematological: Does not bruise/bleed easily.  Psychiatric/Behavioral: Negative for dysphoric mood. The patient is not nervous/anxious.    Past Medical History  Diagnosis Date  . SMAS (superior mesenteric artery syndrome)   . History of borderline personality disorder   . GERD (gastroesophageal reflux disease)   . Hypotension   . Osteopenia   . Gastric ulcer due to Helicobacter pylori   . Anorexia nervosa without bulimia      Family History  Problem Relation Age of Onset  . Alcohol  abuse Mother   . Anxiety disorder Mother   . Depression Mother     severe with hx of suicide attempt. treated with ECT  . COPD Mother   . Anxiety disorder Father   . Drug abuse Father   . COPD Father   . Alcohol abuse Maternal Uncle   . Anxiety disorder Maternal Grandmother   . Depression Maternal Grandmother   . ADD / ADHD Brother      History   Social History  . Marital Status: Divorced    Spouse Name: N/A  . Number of Children: N/A  . Years of Education: N/A   Occupational History  . Not on file.   Social History Main Topics  . Smoking status: Former Smoker -- 0.25 packs/day for .5 years    Types: Cigarettes  . Smokeless tobacco: Never Used  . Alcohol Use: No  . Drug Use: 7.00 per week    Special: Marijuana     Comment: 7g/week. smoking thru out the day  . Sexual Activity: No   Other Topics Concern  . Not on file   Social History Narrative     Allergies  Allergen Reactions  . Bactrim [Sulfamethoxazole-Trimethoprim]     Yeast infection  . Penicillins     UTI  . Diflucan [Fluconazole] Rash     Outpatient Prescriptions Prior to Visit  Medication Sig Dispense Refill  . busPIRone (BUSPAR) 10 MG tablet Take 1 tablet (10 mg total) by mouth 2 (two) times daily. 60 tablet 1  . Calcium-Vitamin D-Vitamin K 500-100-40 MG-UNT-MCG CHEW Chew  1 tablet by mouth 2 (two) times daily.    . cholecalciferol (VITAMIN D) 1000 UNITS tablet Take 2,000 Units by mouth daily.    Marland Kitchen dicyclomine (BENTYL) 10 MG/5ML syrup Take 10 mg by mouth 4 (four) times daily.     Marland Kitchen lamoTRIgine (LAMICTAL) 150 MG tablet Take 2 tablets (300 mg total) by mouth daily. 60 tablet 1  . Linaclotide (LINZESS) 145 MCG CAPS capsule Take 145 mcg by mouth daily.    . Multiple Vitamins-Minerals (MULTIVITAL PO) Take 1 tablet by mouth 2 (two) times daily.    Marland Kitchen OLANZapine (ZYPREXA) 7.5 MG tablet Take 1 tablet (7.5 mg total) by mouth at bedtime. 30 tablet 1  . omeprazole (PRILOSEC) 40 MG capsule Take 40 mg by mouth  daily.    . ondansetron (ZOFRAN) 8 MG tablet Take by mouth 3 (three) times daily as needed for nausea or vomiting.    . ranitidine (ZANTAC) 150 MG tablet Take 150 mg by mouth 2 (two) times daily.    . sertraline (ZOLOFT) 100 MG tablet Take 1 tablet (100 mg total) by mouth daily. 30 tablet 1   No facility-administered medications prior to visit.         Objective:   Physical Exam Filed Vitals:   11/15/14 1400  BP: 100/68  Pulse: 88  Height: 5\' 2"  (1.575 m)  Weight: 113 lb (51.256 kg)  SpO2: 98%   Gen: Pleasant, thin, in no distress,  normal affect  ENT: No lesions,  mouth clear,  oropharynx clear, no postnasal drip  Neck: No JVD, no TMG, no carotid bruits  Lungs: No use of accessory muscles, clear without rales or rhonchi  Cardiovascular: RRR, heart sounds normal, no murmur or gallops, no peripheral edema  Musculoskeletal: No deformities, no cyanosis or clubbing  Neuro: alert, non focal  Skin: Warm, no lesions or rashes        Assessment & Plan:  Cough I suspect an upper airway etiology based on her history. She has well-controlled GERD and minimal allergy symptoms. I'll like to treat her aggressively for both to see if her upper airway irritation improves. We'll also perform pulmonary function testing to assess for asthma or lower airways disease   Please stop your current pro-air for now We'll replace this with non-powdered ProAir, used 2 puffs up to every 4 hours if needed for shortness of breath or coughing Please start loratadine 10 mg daily until next visit Continue your omeprazole and Zantac as you have been using them We will perform full pulmonary function testing Follow with Dr in 1 month

## 2014-11-15 NOTE — Assessment & Plan Note (Signed)
I suspect an upper airway etiology based on her history. She has well-controlled GERD and minimal allergy symptoms. I'll like to treat her aggressively for both to see if her upper airway irritation improves. We'll also perform pulmonary function testing to assess for asthma or lower airways disease   Please stop your current pro-air for now We'll replace this with non-powdered ProAir, used 2 puffs up to every 4 hours if needed for shortness of breath or coughing Please start loratadine 10 mg daily until next visit Continue your omeprazole and Zantac as you have been using them We will perform full pulmonary function testing Follow with Dr Delton Coombes in 1 month

## 2014-11-15 NOTE — Patient Instructions (Addendum)
Please stop your current pro-air for now We'll replace this with non-powdered ProAir, used 2 puffs up to every 4 hours if needed for shortness of breath or coughing Please start loratadine 10 mg daily until next visit Continue your omeprazole and Zantac as you have been using them We will perform full pulmonary function testing Follow with Dr Delton Coombes in 1 month

## 2014-11-17 ENCOUNTER — Ambulatory Visit (INDEPENDENT_AMBULATORY_CARE_PROVIDER_SITE_OTHER): Payer: 59 | Admitting: Psychiatry

## 2014-11-17 DIAGNOSIS — F329 Major depressive disorder, single episode, unspecified: Secondary | ICD-10-CM

## 2014-11-17 DIAGNOSIS — F909 Attention-deficit hyperactivity disorder, unspecified type: Secondary | ICD-10-CM

## 2014-11-29 ENCOUNTER — Encounter (HOSPITAL_COMMUNITY): Payer: Self-pay | Admitting: Psychiatry

## 2014-11-29 ENCOUNTER — Institutional Professional Consult (permissible substitution): Payer: Self-pay | Admitting: Pulmonary Disease

## 2014-11-29 ENCOUNTER — Ambulatory Visit (INDEPENDENT_AMBULATORY_CARE_PROVIDER_SITE_OTHER): Payer: 59 | Admitting: Psychiatry

## 2014-11-29 VITALS — BP 128/80 | HR 72 | Ht 62.0 in | Wt 115.0 lb

## 2014-11-29 DIAGNOSIS — F411 Generalized anxiety disorder: Secondary | ICD-10-CM

## 2014-11-29 DIAGNOSIS — F639 Impulse disorder, unspecified: Secondary | ICD-10-CM

## 2014-11-29 DIAGNOSIS — F900 Attention-deficit hyperactivity disorder, predominantly inattentive type: Secondary | ICD-10-CM | POA: Diagnosis not present

## 2014-11-29 DIAGNOSIS — F5000 Anorexia nervosa, unspecified: Secondary | ICD-10-CM

## 2014-11-29 DIAGNOSIS — F331 Major depressive disorder, recurrent, moderate: Secondary | ICD-10-CM | POA: Diagnosis not present

## 2014-11-29 DIAGNOSIS — G4700 Insomnia, unspecified: Secondary | ICD-10-CM

## 2014-11-29 DIAGNOSIS — F1220 Cannabis dependence, uncomplicated: Secondary | ICD-10-CM

## 2014-11-29 DIAGNOSIS — F988 Other specified behavioral and emotional disorders with onset usually occurring in childhood and adolescence: Secondary | ICD-10-CM

## 2014-11-29 MED ORDER — OLANZAPINE 7.5 MG PO TABS: 7.5000 mg | ORAL_TABLET | Freq: Every day | ORAL | Status: DC

## 2014-11-29 MED ORDER — LAMOTRIGINE 150 MG PO TABS: 300.0000 mg | ORAL_TABLET | Freq: Every day | ORAL | Status: DC

## 2014-11-29 MED ORDER — BUSPIRONE HCL 10 MG PO TABS: 10.0000 mg | ORAL_TABLET | Freq: Two times a day (BID) | ORAL | Status: DC

## 2014-11-29 MED ORDER — SERTRALINE HCL 100 MG PO TABS: 150.0000 mg | ORAL_TABLET | Freq: Every day | ORAL | Status: DC

## 2014-11-29 NOTE — Progress Notes (Signed)
Patient ID: Karen Hahn, female   DOB: Jan 11, 1976, 40 y.o.   MRN: [id number]  [facility] [address] Progress Note  [place] [id number] 39 y.o.  11/29/2014 3:00 PM  Chief Complaint: "doing pretty good but still a little irritable"  History of Present Illness: Pt states overall she is ok. Pt has been isolating herself for the last 3-4 weeks. She is not working anymore. The only things she is doing is picking up friend or running errands. Pt is irritable, isolative and having loss of interest in activities.   Denies depression, worthlessness and hopelessness.   Appetite is good and she is eating 3 meals/day. Pt is now at 115 lbs and is happy about it.  Energy is decent. Sleeping about 5-6 hrs/night.   Pt is argumentative and stuborn especially with her rooommate. Pt will yell but is no longer throwing things when upset.    Zyprexa is helping a lot. She is able to control her anger and irritability.  Anxiety is increased. She is picking at her skin a couple of hours a day. It makes her feel bad about her self.   Concentration is off but she is not on stimulants for her ADHD.  States she is taking Lamictal and  Zyprexa and Zoloft as prescribed and denies SE.  Pt will be done with therapy in June.   Suicidal Ideation: No Plan Formed: No Patient has means to carry out plan: No  Homicidal Ideation: No Plan Formed: No Patient has means to carry out plan: No  Review of Systems: Psychiatric: Agitation: Yes Hallucination: No Depressed Mood: No Insomnia: No Hypersomnia: No Altered Concentration: Yes Feels Worthless: No Grandiose Ideas: No Belief In Special Powers: No New/Increased Substance Abuse: No Compulsions: Yes  Neurologic: Headache: No Seizure: No Paresthesias: No   Review of Systems  Constitutional: Negative for fever, chills and weight loss.  HENT: Negative for congestion, ear pain, nosebleeds and sore throat.   Eyes: Negative for blurred vision,  double vision and pain.  Respiratory: Negative for cough, sputum production and wheezing.   Cardiovascular: Negative for chest pain, palpitations and leg swelling.  Gastrointestinal: Negative for heartburn, nausea, vomiting and abdominal pain.  Musculoskeletal: Negative for back pain, joint pain and neck pain.  Skin: Negative for itching and rash.  Neurological: Negative for dizziness, tremors, sensory change, seizures, loss of consciousness and headaches.  Psychiatric/Behavioral: Negative for depression, suicidal ideas, hallucinations and substance abuse. The patient is nervous/anxious. The patient does not have insomnia.      Past Medical, Family, Social History: Endorsing family hx of depression, ADD, alcohol abuse and anxiety disorder. Pt is living with her friend in [place], [place]. She is divorced and was married for 5 yrs. Pt has a GED. Currently unemployed. Pt smokes a few cigs a week. She smokes THC thru the day. Denies alcohol use.   Past Medical History  Diagnosis Date  . SMAS (superior mesenteric artery syndrome)   . History of borderline personality disorder   . GERD (gastroesophageal reflux disease)   . Hypotension   . Osteopenia   . Gastric ulcer due to Helicobacter pylori   . Anorexia nervosa without bulimia     Outpatient Encounter Prescriptions as of 11/29/2014  Medication Sig  . albuterol (PROAIR HFA) 108 (90 BASE) MCG/ACT inhaler Inhale 2 puffs into the lungs every 6 (six) hours as needed for wheezing or shortness of breath.  . busPIRone (BUSPAR) 10 MG tablet Take 1 tablet (10 mg total) by mouth 2 (  two) times daily.  . Calcium-Vitamin D-Vitamin K 500-100-40 MG-UNT-MCG CHEW Chew 1 tablet by mouth 2 (two) times daily.  . cholecalciferol (VITAMIN D) 1000 UNITS tablet Take 2,000 Units by mouth daily.  Marland Kitchen dicyclomine (BENTYL) 10 MG/5ML syrup Take 10 mg by mouth 4 (four) times daily.   Marland Kitchen lamoTRIgine (LAMICTAL) 150 MG tablet Take 2 tablets (300 mg total) by mouth daily.   . Linaclotide (LINZESS) 145 MCG CAPS capsule Take 145 mcg by mouth daily.  . Multiple Vitamins-Minerals (MULTIVITAL PO) Take 1 tablet by mouth 2 (two) times daily.  Marland Kitchen OLANZapine (ZYPREXA) 7.5 MG tablet Take 1 tablet (7.5 mg total) by mouth at bedtime.  Marland Kitchen omeprazole (PRILOSEC) 40 MG capsule Take 40 mg by mouth daily.  . ondansetron (ZOFRAN) 8 MG tablet Take by mouth 3 (three) times daily as needed for nausea or vomiting.  . ranitidine (ZANTAC) 150 MG tablet Take 150 mg by mouth 2 (two) times daily.  . sertraline (ZOLOFT) 100 MG tablet Take 1 tablet (100 mg total) by mouth daily.    Past Psychiatric History/Hospitalization(s): Anxiety: Yes Bipolar Disorder: No Depression: Yes Mania: No Psychosis: No Schizophrenia: No Personality Disorder: No Hospitalization for psychiatric illness: Yes numerous- 2 intensive eating program treatments in 2013 and 2014. BH eating d/o with Dr. Orland Dec Nov 2011. Multiple inpt hospitalizations at [facility] (2011, 2011, 2005), [facility] (3x), [facility], [place], [place] (06/2013).  History of Electroconvulsive Shock Therapy: No Prior Suicide Attempts: No  Physical Exam: Constitutional:  BP 128/80 mmHg  Pulse 72  Ht 5\' 2"  (1.575 m)  Wt 115 lb (52.164 kg)  BMI 21.03 kg/m2  General Appearance: alert, oriented, no acute distress  Musculoskeletal: Strength & Muscle Tone: within normal limits Gait & Station: normal Patient leans: N/A  Mental Status Examination/Evaluation: Objective: Attitude: Calm and cooperative  Appearance:  Fairly Groomed and Neat, thin. visible scars and scabs all over face. Appears older than stated age.   Eye Contact::  Good  Speech:  Clear and Coherent and Normal Rate  Volume:  Normal  Mood:  anxious  Affect:  Congruent  Thought Process:  Goal Directed, Linear and Logical  Orientation:  Full (Time, Place, and Person)  Thought Content:  WDL  Suicidal Thoughts:  No  Homicidal Thoughts:  No  Judgement:  Fair  Insight:  Fair   Concentration: fair  Memory: Immediate-poor Recent-poor Remote-fair  Recall: fair  Language: fair  Gait and Station: normal  [employer] of Knowledge: average  Psychomotor Activity:  Normal  Akathisia:  No  Handed:  Right     Medical Decision Making (Choose Three): Established Problem, Stable/Improving (1), Review of Psycho-Social Stressors (1), Established Problem, Worsening (2), Review of Medication Regimen & Side Effects (2) and Review of New Medication or Change in Dosage (2)  Assessment: AXIS I  Axis I: Impulse control disorder, Anorexia Nervosa, Cannabis dependence, MDD- moderate, recurrent,  GAD, ADD- inattentive type  AXIS II  Deferred   AXIS III  Past Medical History    Diagnosis  Date    .  SMAS (superior mesenteric artery syndrome)     .  History of borderline personality disorder     .  GERD (gastroesophageal reflux disease)     .  Hypotension     .  Osteopenia     .  Gastric ulcer due to Helicobacter pylori    AXIS IV  other psychosocial or environmental problems and problems with primary support group   AXIS V  51-60 moderate symptoms  Treatment Plan/Recommendations:  Plan of Care:   Medication management with supportive therapy. Risks/benefits and SE of the medication discussed. Pt verbalized understanding and verbal consent obtained for treatment.  Affirm with the patient that the medications are taken as ordered. Patient expressed understanding of how their medications were to be used.  -worsening of anxiety symptoms   Laboratory: none at this time  Psychotherapy:Therapy: brief supportive therapy provided. Discussed psychosocial stressors in detail.      Medications:   -Continue Lamictal 300mg  for mood stabalization - Zyprexa to 7.5mg  po qHS for mood stabalization, adjunct for depression and anxiety - increase Zoloft to 150mg  po qD for mood and anxiety - increase Buspar 10mg  po BID  for sleep - avoid stimulants if possible due to appetite  suppression  -declined benzo request as pt would benefit more for from therapy   Routine PRN Medications: No  Consultations: encouraged pt to attend therapy with Dr.   Safety Concerns: Pt denies SI and is at an acute low risk for suicide.Patient told to call clinic if any problems occur. Patient advised to go to ER  if she should develop SI/HI, side effects, or if symptoms worsen. Has crisis numbers to call if needed. Pt verbalized understanding.   Other: F/up in 6 months or sooner if needed      , MD 11/29/2014

## 2014-12-14 ENCOUNTER — Encounter: Payer: Self-pay | Admitting: Emergency Medicine

## 2014-12-14 ENCOUNTER — Ambulatory Visit (INDEPENDENT_AMBULATORY_CARE_PROVIDER_SITE_OTHER): Payer: Medicare Other | Admitting: Emergency Medicine

## 2014-12-14 VITALS — BP 90/58 | HR 125 | Ht 62.0 in | Wt 115.0 lb

## 2014-12-14 DIAGNOSIS — R059 Cough, unspecified: Secondary | ICD-10-CM

## 2014-12-14 DIAGNOSIS — R05 Cough: Secondary | ICD-10-CM

## 2014-12-14 NOTE — Progress Notes (Signed)
Subjective:    Patient ID: Karen Hahn, female    DOB: 09/01/1975, 39 y.o.   MRN: [id number]  Cough Associated symptoms include shortness of breath. Pertinent negatives include no ear pain, eye redness, fever, headaches, postnasal drip, rash, rhinorrhea, sore throat or wheezing.   39 yo woman, former cigarette smoker, current THC user, hx anorexia, GERD, borderline personality d/o. She presents today for evaluation of cough and SOB. She had a CXR that showed some possible hyperinflation in Feb '16.  She has had cough since about 5 months ago after a URI. The congestion got better but her cough persisted. Non productive. Seems to be worse with exercise. She was given ProAir respi-click 4 x a day, not much better. She also has SOB, can be brought on by singing, activity, anxiety.    She has been having night sweats for the last 6 months.  Has a family hx of COPD, father did not smoke. Mother did. Maternal GF had lung CA.   ROV 12/14/14 -- follow-up visit for cough and history of tobacco and inhaled THC use. She returns for follow-up after changing her albuterol from the powdered formulation to a mist. We also added loratadine to her omeprazole and Zantac. She is currently on protonix instead of omeprazole. She has been taking the albuterol on a schedule - benefits. Her cough is much better, but still can happen in paroxysms. Non productive. She is not very active, occasionally has to stop to rest.    Review of Systems  Constitutional: Negative for fever and unexpected weight change.  HENT: Negative for congestion, dental problem, ear pain, nosebleeds, postnasal drip, rhinorrhea, sinus pressure, sneezing, sore throat and trouble swallowing.   Eyes: Negative for redness and itching.  Respiratory: Positive for cough and shortness of breath. Negative for chest tightness and wheezing.   Cardiovascular: Negative for palpitations and leg swelling.  Gastrointestinal: Negative for nausea and vomiting.   Genitourinary: Negative for dysuria.  Musculoskeletal: Negative for joint swelling.  Skin: Negative for rash.  Neurological: Negative for headaches.  Hematological: Does not bruise/bleed easily.  Psychiatric/Behavioral: Negative for dysphoric mood. The patient is not nervous/anxious.    Past Medical History  Diagnosis Date  . SMAS (superior mesenteric artery syndrome)   . History of borderline personality disorder   . GERD (gastroesophageal reflux disease)   . Hypotension   . Osteopenia   . Gastric ulcer due to Helicobacter pylori   . Anorexia nervosa without bulimia      Family History  Problem Relation Age of Onset  . Alcohol abuse Mother   . Anxiety disorder Mother   . Depression Mother     severe with hx of suicide attempt. treated with ECT  . COPD Mother   . Anxiety disorder Father   . Drug abuse Father   . COPD Father   . Alcohol abuse Maternal Uncle   . Anxiety disorder Maternal Grandmother   . Depression Maternal Grandmother   . ADD / ADHD Brother      History   Social History  . Marital Status: Divorced    Spouse Name: N/A  . Number of Children: N/A  . Years of Education: N/A   Occupational History  . Not on file.   Social History Main Topics  . Smoking status: Former Smoker -- 0.25 packs/day for .5 years    Types: Cigarettes  . Smokeless tobacco: Never Used  . Alcohol Use: No  . Drug Use: 7.00 per week    Special:  Marijuana     Comment: 7g/week. smoking thru out the day  . Sexual Activity: No   Other Topics Concern  . Not on file   Social History Narrative     Allergies  Allergen Reactions  . Bactrim [Sulfamethoxazole-Trimethoprim]     Yeast infection  . Penicillins     UTI  . Diflucan [Fluconazole] Rash     Outpatient Prescriptions Prior to Visit  Medication Sig Dispense Refill  . albuterol (PROAIR HFA) 108 (90 BASE) MCG/ACT inhaler Inhale 2 puffs into the lungs every 6 (six) hours as needed for wheezing or shortness of breath. 1  Inhaler 3  . busPIRone (BUSPAR) 10 MG tablet Take 1 tablet (10 mg total) by mouth 2 (two) times daily. 60 tablet 5  . Calcium-Vitamin D-Vitamin K 500-100-40 MG-UNT-MCG CHEW Chew 1 tablet by mouth 2 (two) times daily.    . cholecalciferol (VITAMIN D) 1000 UNITS tablet Take 2,000 Units by mouth daily.    Marland Kitchen dicyclomine (BENTYL) 10 MG/5ML syrup Take 10 mg by mouth 4 (four) times daily.     Marland Kitchen lamoTRIgine (LAMICTAL) 150 MG tablet Take 2 tablets (300 mg total) by mouth daily. 60 tablet 5  . Linaclotide (LINZESS) 145 MCG CAPS capsule Take 145 mcg by mouth daily.    . Multiple Vitamins-Minerals (MULTIVITAL PO) Take 1 tablet by mouth 2 (two) times daily.    Marland Kitchen OLANZapine (ZYPREXA) 7.5 MG tablet Take 1 tablet (7.5 mg total) by mouth at bedtime. 30 tablet 5  . ondansetron (ZOFRAN) 8 MG tablet Take by mouth 3 (three) times daily as needed for nausea or vomiting.    . ranitidine (ZANTAC) 150 MG tablet Take 150 mg by mouth 2 (two) times daily.    . sertraline (ZOLOFT) 100 MG tablet Take 1.5 tablets (150 mg total) by mouth daily. 45 tablet 5  . omeprazole (PRILOSEC) 40 MG capsule Take 40 mg by mouth daily.     No facility-administered medications prior to visit.         Objective:   Physical Exam Filed Vitals:   12/14/14 1629  BP: 90/58  Pulse: 125  Height: 5\' 2"  (1.575 m)  Weight: 115 lb (52.164 kg)  SpO2: 97%   Gen: Pleasant, thin, in no distress,  normal affect  ENT: No lesions,  mouth clear,  oropharynx clear, no postnasal drip  Neck: No JVD, no TMG, no carotid bruits  Lungs: No use of accessory muscles, clear without rales or rhonchi  Cardiovascular: RRR, heart sounds normal, no murmur or gallops, no peripheral edema  Musculoskeletal: No deformities, no cyanosis or clubbing  Neuro: alert, non focal  Skin: Warm, no lesions or rashes        Assessment & Plan:  Cough Improved since last visit. I believe that she is benefiting from both GERD and rhinitis therapy but she also has  been benefiting from scheduled albuterol. I would like to ensure that she does not have a component of asthma. We will perform full pulmonary function testing, to new her current medications as ordered including the albuterol. Follow next  available

## 2014-12-14 NOTE — Patient Instructions (Signed)
Please continue your loratadine, pantoprazole, ranitidine as you have been taking them We will perform full pulmonary function testing at your next office visit Please continue to use your albuterol as you have been doing. Do not use it for the 2 days prior to your breathing test Follow with Dr Delton Coombes next available.

## 2014-12-14 NOTE — Assessment & Plan Note (Signed)
Improved since last visit. I believe that she is benefiting from both GERD and rhinitis therapy but she also has been benefiting from scheduled albuterol. I would like to ensure that she does not have a component of asthma. We will perform full pulmonary function testing, to new her current medications as ordered including the albuterol. Follow next  available

## 2015-01-17 ENCOUNTER — Ambulatory Visit: Payer: Self-pay | Admitting: Emergency Medicine

## 2015-01-26 ENCOUNTER — Ambulatory Visit (INDEPENDENT_AMBULATORY_CARE_PROVIDER_SITE_OTHER): Payer: 59 | Admitting: Psychiatry

## 2015-01-26 DIAGNOSIS — F329 Major depressive disorder, single episode, unspecified: Secondary | ICD-10-CM

## 2015-01-26 DIAGNOSIS — F909 Attention-deficit hyperactivity disorder, unspecified type: Secondary | ICD-10-CM

## 2015-02-21 ENCOUNTER — Emergency Department (INDEPENDENT_AMBULATORY_CARE_PROVIDER_SITE_OTHER)
Admission: EM | Admit: 2015-02-21 | Discharge: 2015-02-21 | Disposition: A | Discharge status: Home / Self Care | Payer: Medicare Other | Source: Home / Self Care

## 2015-02-21 ENCOUNTER — Encounter (HOSPITAL_COMMUNITY): Payer: Self-pay | Admitting: *Deleted

## 2015-02-21 ENCOUNTER — Emergency Department (INDEPENDENT_AMBULATORY_CARE_PROVIDER_SITE_OTHER): Payer: 59

## 2015-02-21 DIAGNOSIS — S60211A Contusion of right wrist, initial encounter: Secondary | ICD-10-CM

## 2015-02-21 MED ORDER — MELOXICAM 15 MG PO TABS: 15.0000 mg | ORAL_TABLET | Freq: Every day | ORAL | Status: DC

## 2015-02-21 NOTE — ED Provider Notes (Signed)
CSN: [id number]     Arrival date & time 02/21/15  1701 History   None    Chief Complaint  Patient presents with  . Wrist Injury   (Consider location/radiation/quality/duration/timing/severity/associated sxs/prior Treatment) Patient is a 39 y.o. female presenting with wrist injury. The history is provided by the patient.  Wrist Injury Location:  Wrist Time since incident:  4 hours Injury: yes   Mechanism of injury: crush   Crush injury:    Mechanism:  Door   Duration of crushing force:  2 seconds Wrist location:  R wrist Pain details:    Quality:  Aching   Severity:  Moderate   Onset quality:  Sudden Chronicity:  New Handedness:  Right-handed Dislocation: no   Prior injury to area:  Yes Relieved by:  Immobilization Worsened by:  Nothing tried Ineffective treatments:  NSAIDs and ice 39 yo woman from [place] where she suffered previous right wrist trauma with subsequent surgical intervention and resolution.  She complains of 4 hours of wrist pain after stating an elevator door closed and she stuck her arm out to keep it open, "crushing" her right wrist.   She states that she tried ice, ibuprofen, a splint and elevation.  Only the latter two seemed to offer relief  Past Medical History  Diagnosis Date  . SMAS (superior mesenteric artery syndrome)   . History of borderline personality disorder   . GERD (gastroesophageal reflux disease)   . Hypotension   . Osteopenia   . Gastric ulcer due to Helicobacter pylori   . Anorexia nervosa without bulimia    Past Surgical History  Procedure Laterality Date  . Laparoscopic abdominal exploration  2003  . Gastric bypass  07/2002  . Dilation and curettage of uterus  2005   Family History  Problem Relation Age of Onset  . Alcohol abuse Mother   . Anxiety disorder Mother   . Depression Mother     severe with hx of suicide attempt. treated with ECT  . COPD Mother   . Anxiety disorder Father   . Drug abuse Father   . COPD  Father   . Alcohol abuse Maternal Uncle   . Anxiety disorder Maternal Grandmother   . Depression Maternal Grandmother   . ADD / ADHD Brother    History  Substance Use Topics  . Smoking status: Former Smoker -- 0.25 packs/day for .5 years    Types: Cigarettes  . Smokeless tobacco: Never Used  . Alcohol Use: No   OB History    No data available     Review of Systems  Constitutional: Negative.   HENT: Negative.   Eyes: Negative.   Respiratory: Negative.   Cardiovascular: Negative.   Gastrointestinal: Negative.   Genitourinary: Negative.   Musculoskeletal: Positive for arthralgias.    Allergies  Bactrim; Penicillins; and Diflucan  Home Medications   Prior to Admission medications   Medication Sig Start Date End Date Taking? Authorizing Provider  albuterol (PROAIR HFA) 108 (90 BASE) MCG/ACT inhaler Inhale 2 puffs into the lungs every 6 (six) hours as needed for wheezing or shortness of breath. 11/15/14   Leslye Peer, MD  busPIRone (BUSPAR) 10 MG tablet Take 1 tablet (10 mg total) by mouth 2 (two) times daily. 11/29/14   Oletta Darter, MD  Calcium-Vitamin D-Vitamin K 500-100-40 MG-UNT-MCG CHEW Chew 1 tablet by mouth 2 (two) times daily.    Historical Provider, MD  cholecalciferol (VITAMIN D) 1000 UNITS tablet Take 2,000 Units by mouth daily.  Historical Provider, MD  dicyclomine (BENTYL) 10 MG/5ML syrup Take 10 mg by mouth 4 (four) times daily.     Historical Provider, MD  lamoTRIgine (LAMICTAL) 150 MG tablet Take 2 tablets (300 mg total) by mouth daily. 11/29/14   Oletta Darter, MD  Linaclotide [facility]) 145 MCG CAPS capsule Take 145 mcg by mouth daily.    Historical Provider, MD  Multiple Vitamins-Minerals (MULTIVITAL PO) Take 1 tablet by mouth 2 (two) times daily.    Historical Provider, MD  OLANZapine (ZYPREXA) 7.5 MG tablet Take 1 tablet (7.5 mg total) by mouth at bedtime. 11/29/14 11/29/15  Oletta Darter, MD  ondansetron (ZOFRAN) 8 MG tablet Take by mouth 3 (three)  times daily as needed for nausea or vomiting.    Historical Provider, MD  pantoprazole (PROTONIX) 40 MG tablet Take 1 tablet by mouth daily. 12/09/14   Historical Provider, MD  ranitidine (ZANTAC) 150 MG tablet Take 150 mg by mouth 2 (two) times daily.    Historical Provider, MD  sertraline (ZOLOFT) 100 MG tablet Take 1.5 tablets (150 mg total) by mouth daily. 11/29/14 11/29/15  Oletta Darter, MD   There were no vitals taken for this visit. Physical Exam  Constitutional: She is oriented to person, place, and time. She appears well-developed.  Appears chronically ill  HENT:  Head: Normocephalic and atraumatic.  Eyes: Conjunctivae and EOM are normal. Pupils are equal, round, and reactive to light.  Neck: Normal range of motion. Neck supple.  Cardiovascular: Normal rate.   Pulmonary/Chest: Effort normal.  Musculoskeletal: She exhibits tenderness. She exhibits no edema.  Well healed surgical scar on right wrist No ecchymosis or swelling Mild restriction in ROM  Neurological: She is alert and oriented to person, place, and time.  Skin: Skin is warm and dry. No erythema.  Nursing note and vitals reviewed.   ED Course  Procedures (including critical care time)  Imaging Review Right wrist shows not acute changes  MDM   This chart was scribed in my presence and reviewed by me personally.    ICD-9-CM ICD-10-CM   1. Wrist contusion, right, initial encounter 923.21 S60.211A meloxicam (MOBIC) 15 MG tablet   Ice, splint.  Signed, Elvina Sidle, MD   Elvina Sidle, MD 02/21/15 [phone]

## 2015-02-21 NOTE — ED Notes (Signed)
Pt  Reports  History  Of  Problems      With her  r  Wrist              In  Past   She  Reports    She          Reports        Wrist   Got  Caught  In in  Elevator      Door             Pt  Reports  Pain in the  Affected  Wrist

## 2015-02-21 NOTE — Discharge Instructions (Signed)
The x-ray shows no bony injury.  Take the meloxicam daily until the pain subsides.  Wear the wrist splint for next 3 days and apply more ice tonight.

## 2015-03-30 ENCOUNTER — Ambulatory Visit (INDEPENDENT_AMBULATORY_CARE_PROVIDER_SITE_OTHER): Payer: 59 | Admitting: Psychiatry

## 2015-03-30 DIAGNOSIS — F329 Major depressive disorder, single episode, unspecified: Secondary | ICD-10-CM

## 2015-03-30 DIAGNOSIS — F909 Attention-deficit hyperactivity disorder, unspecified type: Secondary | ICD-10-CM

## 2015-04-14 ENCOUNTER — Emergency Department (HOSPITAL_COMMUNITY)
Admission: EM | Admit: 2015-04-14 | Discharge: 2015-04-14 | Disposition: A | Discharge status: Home / Self Care | Payer: Medicare Other | Attending: Emergency Medicine | Admitting: Emergency Medicine

## 2015-04-14 ENCOUNTER — Encounter (HOSPITAL_COMMUNITY): Payer: Self-pay | Admitting: Emergency Medicine

## 2015-04-14 DIAGNOSIS — K219 Gastro-esophageal reflux disease without esophagitis: Secondary | ICD-10-CM | POA: Diagnosis not present

## 2015-04-14 DIAGNOSIS — R1013 Epigastric pain: Secondary | ICD-10-CM | POA: Diagnosis present

## 2015-04-14 DIAGNOSIS — M8580 Other specified disorders of bone density and structure, unspecified site: Secondary | ICD-10-CM | POA: Diagnosis not present

## 2015-04-14 DIAGNOSIS — Z8679 Personal history of other diseases of the circulatory system: Secondary | ICD-10-CM | POA: Insufficient documentation

## 2015-04-14 DIAGNOSIS — Z87891 Personal history of nicotine dependence: Secondary | ICD-10-CM | POA: Diagnosis not present

## 2015-04-14 DIAGNOSIS — K2970 Gastritis, unspecified, without bleeding: Secondary | ICD-10-CM | POA: Insufficient documentation

## 2015-04-14 DIAGNOSIS — Z8659 Personal history of other mental and behavioral disorders: Secondary | ICD-10-CM | POA: Diagnosis not present

## 2015-04-14 DIAGNOSIS — Z79899 Other long term (current) drug therapy: Secondary | ICD-10-CM | POA: Insufficient documentation

## 2015-04-14 DIAGNOSIS — Z8619 Personal history of other infectious and parasitic diseases: Secondary | ICD-10-CM | POA: Diagnosis not present

## 2015-04-14 DIAGNOSIS — Z3202 Encounter for pregnancy test, result negative: Secondary | ICD-10-CM | POA: Insufficient documentation

## 2015-04-14 DIAGNOSIS — Z880 Allergy status to penicillin: Secondary | ICD-10-CM | POA: Insufficient documentation

## 2015-04-14 LAB — COMPREHENSIVE METABOLIC PANEL
ALT: 26 U/L (ref 14–54)
AST: 35 U/L (ref 15–41)
Albumin: 4 g/dL (ref 3.5–5.0)
Alkaline Phosphatase: 69 U/L (ref 38–126)
Anion gap: 7 (ref 5–15)
BUN: 9 mg/dL (ref 6–20)
CO2: 25 mmol/L (ref 22–32)
Calcium: 9.1 mg/dL (ref 8.9–10.3)
Chloride: 103 mmol/L (ref 101–111)
Creatinine, Ser: 0.8 mg/dL (ref 0.44–1.00)
GFR calc Af Amer: >60 mL/min (ref >60)
GFR calc non Af Amer: >60 mL/min (ref >60)
Glucose, Bld: 175 mg/dL — ABNORMAL HIGH (ref 65–99)
Potassium: 3.8 mmol/L (ref 3.5–5.1)
Sodium: 135 mmol/L (ref 135–145)
Total Bilirubin: 0.5 mg/dL (ref 0.3–1.2)
Total Protein: 6.7 g/dL (ref 6.5–8.1)

## 2015-04-14 LAB — URINALYSIS, ROUTINE W REFLEX MICROSCOPIC
Bilirubin Urine: NEGATIVE
Bilirubin Urine: NEGATIVE
Glucose, UA: NEGATIVE mg/dL
Glucose, UA: NEGATIVE mg/dL
Hgb urine dipstick: NEGATIVE
Hgb urine dipstick: NEGATIVE
Ketones, ur: NEGATIVE mg/dL
Ketones, ur: NEGATIVE mg/dL
Leukocytes, UA: NEGATIVE
Leukocytes, UA: NEGATIVE
Nitrite: NEGATIVE
Nitrite: NEGATIVE
Protein, ur: NEGATIVE mg/dL
Protein, ur: NEGATIVE mg/dL
Specific Gravity, Urine: 1.009 (ref 1.005–1.030)
Specific Gravity, Urine: 1.009 (ref 1.005–1.030)
Urobilinogen, UA: 0.2 mg/dL (ref 0.0–1.0)
Urobilinogen, UA: 0.2 mg/dL (ref 0.0–1.0)
pH: 6 (ref 5.0–8.0)
pH: 7 (ref 5.0–8.0)

## 2015-04-14 LAB — CBC
HCT: 37.4 % (ref 36.0–46.0)
Hemoglobin: 12.2 g/dL (ref 12.0–15.0)
MCH: 29.3 pg (ref 26.0–34.0)
MCHC: 32.6 g/dL (ref 30.0–36.0)
MCV: 89.9 fL (ref 78.0–100.0)
Platelets: 243 10*3/uL (ref 150–400)
RBC: 4.16 MIL/uL (ref 3.87–5.11)
RDW: 12.6 % (ref 11.5–15.5)
WBC: 9.1 10*3/uL (ref 4.0–10.5)

## 2015-04-14 LAB — POC URINE PREG, ED: Preg Test, Ur: NEGATIVE

## 2015-04-14 LAB — LIPASE, BLOOD: Lipase: 44 U/L (ref 22–51)

## 2015-04-14 MED ORDER — PROMETHAZINE HCL 25 MG PO TABS: 25.0000 mg | ORAL_TABLET | Freq: Four times a day (QID) | ORAL | Status: DC | PRN

## 2015-04-14 MED ORDER — PROMETHAZINE HCL 25 MG PO TABS
25.0000 mg | ORAL_TABLET | Freq: Once | ORAL | Status: DC
  Filled 2015-04-14: qty 1

## 2015-04-14 MED ORDER — DICYCLOMINE HCL 10 MG PO CAPS
10.0000 mg | ORAL_CAPSULE | Freq: Once | ORAL | Status: AC
  Administered 2015-04-14: 10 mg via ORAL
  Filled 2015-04-14: qty 1

## 2015-04-14 NOTE — ED Notes (Signed)
Pt from home for eval of upper abd pain and n/v x3 days, pt states went to pcp and was told there her amylase was high. Pt states LMP 2 weeks ago. nad noted.

## 2015-04-14 NOTE — ED Notes (Signed)
MD at bedside. 

## 2015-04-14 NOTE — ED Notes (Signed)
Pharmacy called to send phenegran

## 2015-04-14 NOTE — Discharge Instructions (Signed)
Abdominal Pain, Women °Abdominal (stomach, pelvic, or belly) pain can be caused by many things. It is important to tell your doctor: °· The location of the pain. °· Does it come and go or is it present all the time? °· Are there things that start the pain (eating certain foods, exercise)? °· Are there other symptoms associated with the pain (fever, nausea, vomiting, diarrhea)? °All of this is helpful to know when trying to find the cause of the pain. °CAUSES  °· Stomach: virus or bacteria infection, or ulcer. °· Intestine: appendicitis (inflamed appendix), regional ileitis (Crohn's disease), ulcerative colitis (inflamed colon), irritable bowel syndrome, diverticulitis (inflamed diverticulum of the colon), or cancer of the stomach or intestine. °· Gallbladder disease or stones in the gallbladder. °· Kidney disease, kidney stones, or infection. °· Pancreas infection or cancer. °· Fibromyalgia (pain disorder). °· Diseases of the female organs: °¨ Uterus: fibroid (non-cancerous) tumors or infection. °¨ Fallopian tubes: infection or tubal pregnancy. °¨ Ovary: cysts or tumors. °¨ Pelvic adhesions (scar tissue). °¨ Endometriosis (uterus lining tissue growing in the pelvis and on the pelvic organs). °¨ Pelvic congestion syndrome (female organs filling up with blood just before the menstrual period). °¨ Pain with the menstrual period. °¨ Pain with ovulation (producing an egg). °¨ Pain with an IUD (intrauterine device, birth control) in the uterus. °¨ Cancer of the female organs. °· Functional pain (pain not caused by a disease, may improve without treatment). °· Psychological pain. °· Depression. °DIAGNOSIS  °Your doctor will decide the seriousness of your pain by doing an examination. °· Blood tests. °· X-rays. °· Ultrasound. °· CT scan (computed tomography, special type of X-ray). °· MRI (magnetic resonance imaging). °· Cultures, for infection. °· Barium enema (dye inserted in the large intestine, to better view it with  X-rays). °· Colonoscopy (looking in intestine with a lighted tube). °· Laparoscopy (minor surgery, looking in abdomen with a lighted tube). °· Major abdominal exploratory surgery (looking in abdomen with a large incision). °TREATMENT  °The treatment will depend on the cause of the pain.  °· Many cases can be observed and treated at home. °· Over-the-counter medicines recommended by your caregiver. °· Prescription medicine. °· Antibiotics, for infection. °· Birth control pills, for painful periods or for ovulation pain. °· Hormone treatment, for endometriosis. °· Nerve blocking injections. °· Physical therapy. °· Antidepressants. °· Counseling with a psychologist or psychiatrist. °· Minor or major surgery. °HOME CARE INSTRUCTIONS  °· Do not take laxatives, unless directed by your caregiver. °· Take over-the-counter pain medicine only if ordered by your caregiver. Do not take aspirin because it can cause an upset stomach or bleeding. °· Try a clear liquid diet (broth or water) as ordered by your caregiver. Slowly move to a bland diet, as tolerated, if the pain is related to the stomach or intestine. °· Have a thermometer and take your temperature several times a day, and record it. °· Bed rest and sleep, if it helps the pain. °· Avoid sexual intercourse, if it causes pain. °· Avoid stressful situations. °· Keep your follow-up appointments and tests, as your caregiver orders. °· If the pain does not go away with medicine or surgery, you may try: °¨ Acupuncture. °¨ Relaxation exercises (yoga, meditation). °¨ Group therapy. °¨ Counseling. °SEEK MEDICAL CARE IF:  °· You notice certain foods cause stomach pain. °· Your home care treatment is not helping your pain. °· You need stronger pain medicine. °· You want your IUD removed. °· You feel faint or   lightheaded. °· You develop nausea and vomiting. °· You develop a rash. °· You are having side effects or an allergy to your medicine. °SEEK IMMEDIATE MEDICAL CARE IF:  °· Your  pain does not go away or gets worse. °· You have a fever. °· Your pain is felt only in portions of the abdomen. The right side could possibly be appendicitis. The left lower portion of the abdomen could be colitis or diverticulitis. °· You are passing blood in your stools (bright red or black tarry stools, with or without vomiting). °· You have blood in your urine. °· You develop chills, with or without a fever. °· You pass out. °MAKE SURE YOU:  °· Understand these instructions. °· Will watch your condition. °· Will get help right away if you are not doing well or get worse. °Document Released: 05/19/2007 Document Revised: 12/06/2013 Document Reviewed: 06/08/2009 °[employer]® Patient Information ©2015 [employer]. This information is not intended to replace advice given to you by your health care provider. Make sure you discuss any questions you have with your health care provider. ° °

## 2015-04-14 NOTE — ED Provider Notes (Signed)
CSN: [id number]     Arrival date & time 04/14/15  1251 History   First MD Initiated Contact with Patient 04/14/15 1650     Chief Complaint  Patient presents with  . Abdominal Pain     (Consider location/radiation/quality/duration/timing/severity/associated sxs/prior Treatment) Patient is a 39 y.o. female presenting with abdominal pain.  Abdominal Pain Pain location:  Epigastric Pain quality: pressure   Pain radiates to:  Does not radiate Pain severity:  Severe (7/10\) Onset quality:  Gradual Duration:  2 weeks Timing:  Constant Progression:  Worsening Chronicity:  Recurrent Context comment:  Diagnosed with pancreatitis in past Relieved by:  Nothing (sometimes heating pads, bentyl) Worsened by:  Nothing tried Ineffective treatments:  NSAIDs (baths, zofran) Associated symptoms: nausea and vomiting   Associated symptoms: no chest pain, no constipation, no cough, no diarrhea, no dysuria, no fever, no hematemesis, no shortness of breath, no sore throat, no vaginal bleeding and no vaginal discharge   Risk factors: multiple surgeries (gastric bypass, 2 d and cs)     Past Medical History  Diagnosis Date  . SMAS (superior mesenteric artery syndrome)   . History of borderline personality disorder   . GERD (gastroesophageal reflux disease)   . Hypotension   . Osteopenia   . Gastric ulcer due to Helicobacter pylori   . Anorexia nervosa without bulimia    Past Surgical History  Procedure Laterality Date  . Laparoscopic abdominal exploration  2003  . Gastric bypass  07/2002  . Dilation and curettage of uterus  2005   Family History  Problem Relation Age of Onset  . Alcohol abuse Mother   . Anxiety disorder Mother   . Depression Mother     severe with hx of suicide attempt. treated with ECT  . COPD Mother   . Anxiety disorder Father   . Drug abuse Father   . COPD Father   . Alcohol abuse Maternal Uncle   . Anxiety disorder Maternal Grandmother   . Depression Maternal  Grandmother   . ADD / ADHD Brother    Social History  Substance Use Topics  . Smoking status: Former Smoker -- 0.25 packs/day for .5 years    Types: Cigarettes  . Smokeless tobacco: Never Used  . Alcohol Use: No   OB History    No data available     Review of Systems  Constitutional: Negative for fever.  HENT: Negative for sore throat.   Eyes: Negative for visual disturbance.  Respiratory: Negative for cough and shortness of breath.   Cardiovascular: Negative for chest pain.  Gastrointestinal: Positive for nausea, vomiting and abdominal pain. Negative for diarrhea, constipation and hematemesis.  Genitourinary: Negative for dysuria, vaginal bleeding, vaginal discharge and difficulty urinating.  Musculoskeletal: Negative for back pain and neck pain.  Skin: Negative for rash.  Neurological: Negative for syncope and headaches.  All other systems reviewed and are negative.     Allergies  Bactrim; Penicillins; and Diflucan  Home Medications   Prior to Admission medications   Medication Sig Start Date End Date Taking? Authorizing Provider  albuterol (PROAIR HFA) 108 (90 BASE) MCG/ACT inhaler Inhale 2 puffs into the lungs every 6 (six) hours as needed for wheezing or shortness of breath. 11/15/14  Yes Leslye Peer, MD  Calcium-Vitamin D-Vitamin K 500-100-40 MG-UNT-MCG CHEW Chew 1 tablet by mouth 2 (two) times daily.   Yes Historical Provider, MD  cholecalciferol (VITAMIN D) 1000 UNITS tablet Take 2,000 Units by mouth daily.   Yes Historical Provider, MD  dicyclomine (BENTYL) 10 MG capsule Take 10 mg by mouth 4 (four) times daily -  before meals and at bedtime.   Yes Historical Provider, MD  ibuprofen (ADVIL,MOTRIN) 200 MG tablet Take 600 mg by mouth every 6 (six) hours as needed for moderate pain.   Yes Historical Provider, MD  lamoTRIgine (LAMICTAL) 150 MG tablet Take 2 tablets (300 mg total) by mouth daily. 11/29/14  Yes Oletta Darter, MD  lubiprostone (AMITIZA) 8 MCG capsule  Take 8 mcg by mouth daily with breakfast.   Yes Historical Provider, MD  meloxicam (MOBIC) 15 MG tablet Take 1 tablet (15 mg total) by mouth daily. 02/21/15  Yes Elvina Sidle, MD  Multiple Vitamins-Minerals (MULTIVITAL PO) Take 1 tablet by mouth 2 (two) times daily.   Yes Historical Provider, MD  OLANZapine (ZYPREXA) 7.5 MG tablet Take 1 tablet (7.5 mg total) by mouth at bedtime. 11/29/14 11/29/15 Yes Oletta Darter, MD  ondansetron (ZOFRAN) 8 MG tablet Take 8 mg by mouth 3 (three) times daily as needed for nausea or vomiting.    Yes Historical Provider, MD  pantoprazole (PROTONIX) 40 MG tablet Take 40 mg by mouth daily.  12/09/14  Yes Historical Provider, MD  ranitidine (ZANTAC) 150 MG tablet Take 150 mg by mouth 2 (two) times daily.   Yes Historical Provider, MD  sertraline (ZOLOFT) 100 MG tablet Take 1.5 tablets (150 mg total) by mouth daily. 11/29/14 11/29/15 Yes Oletta Darter, MD  busPIRone (BUSPAR) 10 MG tablet Take 1 tablet (10 mg total) by mouth 2 (two) times daily. 11/29/14   Oletta Darter, MD  promethazine (PHENERGAN) 25 MG tablet Take 1 tablet (25 mg total) by mouth every 6 (six) hours as needed for nausea or vomiting. 04/14/15   Alvira Monday, MD   BP 103/60 mmHg  Pulse 86  Temp(Src) 98.5 F (36.9 C) (Oral)  Resp 16  SpO2 100%  LMP 03/31/2015 (Approximate) Physical Exam  Constitutional: She is oriented to person, place, and time. She appears well-developed and well-nourished. No distress.  HENT:  Head: Normocephalic and atraumatic.  Eyes: Conjunctivae and EOM are normal.  Neck: Normal range of motion.  Cardiovascular: Normal rate, regular rhythm, normal heart sounds and intact distal pulses.  Exam reveals no gallop and no friction rub.   No murmur heard. Pulmonary/Chest: Effort normal and breath sounds normal. No respiratory distress. She has no wheezes. She has no rales.  Abdominal: Soft. She exhibits no distension. There is tenderness (epigastric). There is no rigidity, no  rebound, no guarding, no CVA tenderness, no tenderness at McBurney's point and negative Murphy's sign.  Musculoskeletal: She exhibits no edema or tenderness.  Neurological: She is alert and oriented to person, place, and time.  Skin: Skin is warm and dry. No rash noted. She is not diaphoretic. No erythema.  Nursing note and vitals reviewed.   ED Course  Procedures (including critical care time) Labs Review Labs Reviewed  COMPREHENSIVE METABOLIC PANEL - Abnormal; Notable for the following:    Glucose, Bld 175 (*)    All other components within normal limits  LIPASE, BLOOD  CBC  URINALYSIS, ROUTINE W REFLEX MICROSCOPIC (NOT AT [facility])  URINALYSIS, ROUTINE W REFLEX MICROSCOPIC (NOT AT [facility])  POC URINE PREG, ED    Imaging Review No results found. I have personally reviewed and evaluated these images and lab results as part of my medical decision-making.   EKG Interpretation None      MDM   Final diagnoses:  Epigastric pain  Gastritis   38  [place] female with a history of anorexia, depression, gastric ulcer, superior mesenteric artery syndrome status post gastric bypass, irritable bowel syndrome disease [facility] presents with concern of epigastric abdominal pain. Patient had labs done through her PCP which showed elevated lipase and it was recommended she come to the emergency department.  DDx includes appendicitis, pancreatitis, cholecystitis, pyelonephritis, nephrolithiasis, diverticulitis, PID, ovarian torsion, ectopic pregnancy, and tuboovarian abscess.    Patient without lower pelvic pain, no vaginal discharge or bleeding, and given exam and history have low suspicion for ovarian torsion, PID or tubo-ovarian abscess. Pregnancy test is negative.  Transaminases and lipase within normal limits in our emergency department.  Exam benign, pt well appearing and have low suspicion for cholecystitis, acute obstruction, internal hernia.  [facility] to discuss  patient.  The gastroenterologist on-call reviewed patient's records in the system and reports that lipase was elevated, however was not 3 times the upper limit of normal.  Recommend clear or bland diet and follow-up with her gastroenterologist. (scheduled for colonoscopy in 79mo) Patient discharged in stable condition with understanding of reasons to return.  Given prescription for Bentyl and Zofran.      Alvira Monday, MD 04/15/15 [phone]

## 2015-04-20 ENCOUNTER — Encounter (HOSPITAL_COMMUNITY): Payer: Self-pay | Admitting: Psychiatry

## 2015-04-20 ENCOUNTER — Ambulatory Visit (INDEPENDENT_AMBULATORY_CARE_PROVIDER_SITE_OTHER): Payer: 59 | Admitting: Psychiatry

## 2015-04-20 VITALS — BP 104/64 | HR 94 | Ht 62.5 in | Wt 106.4 lb

## 2015-04-20 DIAGNOSIS — F639 Impulse disorder, unspecified: Secondary | ICD-10-CM

## 2015-04-20 DIAGNOSIS — Z79899 Other long term (current) drug therapy: Secondary | ICD-10-CM

## 2015-04-20 DIAGNOSIS — F1220 Cannabis dependence, uncomplicated: Secondary | ICD-10-CM

## 2015-04-20 DIAGNOSIS — F411 Generalized anxiety disorder: Secondary | ICD-10-CM | POA: Diagnosis not present

## 2015-04-20 DIAGNOSIS — F5000 Anorexia nervosa, unspecified: Secondary | ICD-10-CM

## 2015-04-20 DIAGNOSIS — F331 Major depressive disorder, recurrent, moderate: Secondary | ICD-10-CM

## 2015-04-20 DIAGNOSIS — G4700 Insomnia, unspecified: Secondary | ICD-10-CM

## 2015-04-20 DIAGNOSIS — F900 Attention-deficit hyperactivity disorder, predominantly inattentive type: Secondary | ICD-10-CM

## 2015-04-20 MED ORDER — BUSPIRONE HCL 10 MG PO TABS: 10.0000 mg | ORAL_TABLET | Freq: Two times a day (BID) | ORAL | Status: DC

## 2015-04-20 MED ORDER — LAMOTRIGINE 150 MG PO TABS: 300.0000 mg | ORAL_TABLET | Freq: Every day | ORAL | Status: DC

## 2015-04-20 MED ORDER — OLANZAPINE 7.5 MG PO TABS: 7.5000 mg | ORAL_TABLET | Freq: Every day | ORAL | Status: DC

## 2015-04-20 MED ORDER — SERTRALINE HCL 100 MG PO TABS: 200.0000 mg | ORAL_TABLET | Freq: Every day | ORAL | Status: DC

## 2015-04-20 NOTE — Progress Notes (Signed)
[facility] [id number] Progress Note  Karen Hahn [id number] 39 y.o.  04/20/2015 3:38 PM  Chief Complaint: "anxiety is high"  History of Present Illness: Pt states she is depressed. Pt has been isolating herself and is endorsing anhedonia. She is still not working. Pt is irritable. Her disability is under review and it is causing anxiety.  Pt has a 32 week old puppy and is alone with her all day.    Denies worthlessness and hopelessness.   Appetite is good and she is eating 3 meals/day. Energy is variable. Sleeping about 6-8 hrs/night.   Anxiety is increased. She is picking at her skin a couple of hours a day. It makes her feel bad about her self. States picking at her scabs gives her relife.   Concentration remains poor.   States she is taking Lamictal and  Zyprexa and Zoloft as prescribed and denies SE.  Pt is in therapy.   Pt is having bowel incontinence and is seeing a GI doc.   Suicidal Ideation: No Plan Formed: No Patient has means to carry out plan: No  Homicidal Ideation: No Plan Formed: No Patient has means to carry out plan: No  Review of Systems: Psychiatric: Agitation: Yes Hallucination: No Depressed Mood: Yes Insomnia: No Hypersomnia: No Altered Concentration: Yes Feels Worthless: No Grandiose Ideas: No Belief In Special Powers: No New/Increased Substance Abuse: No Compulsions: Yes  Neurologic: Headache: No Seizure: No Paresthesias: No   Review of Systems  Constitutional: Negative for fever, chills and weight loss.  HENT: Negative for congestion, ear pain, nosebleeds and sore throat.   Eyes: Negative for blurred vision, double vision and pain.  Respiratory: Negative for cough, sputum production and wheezing.   Cardiovascular: Negative for chest pain, palpitations and leg swelling.  Gastrointestinal: Positive for diarrhea. Negative for heartburn, nausea, vomiting and abdominal pain.  Musculoskeletal: Negative for back pain, joint pain  and neck pain.  Skin: Positive for rash. Negative for itching.  Neurological: Negative for dizziness, tremors, sensory change, seizures, loss of consciousness and headaches.  Psychiatric/Behavioral: Positive for depression. Negative for suicidal ideas, hallucinations and substance abuse. The patient is nervous/anxious. The patient does not have insomnia.      Past Medical, Family, Social History: Endorsing family hx of depression, ADD, alcohol abuse and anxiety disorder. Pt is living with her friend in [place], [place]. She is divorced and was married for 5 yrs. Pt has a GED. Currently unemployed and on disability. Pt smokes a few cigs a week. She smokes THC thru the day. Denies alcohol use.   Past Medical History  Diagnosis Date  . SMAS (superior mesenteric artery syndrome)   . History of borderline personality disorder   . GERD (gastroesophageal reflux disease)   . Hypotension   . Osteopenia   . Gastric ulcer due to Helicobacter pylori   . Anorexia nervosa without bulimia     Outpatient Encounter Prescriptions as of 04/20/2015  Medication Sig  . albuterol (PROAIR HFA) 108 (90 BASE) MCG/ACT inhaler Inhale 2 puffs into the lungs every 6 (six) hours as needed for wheezing or shortness of breath.  . busPIRone (BUSPAR) 10 MG tablet Take 1 tablet (10 mg total) by mouth 2 (two) times daily.  . Calcium-Vitamin D-Vitamin K 500-100-40 MG-UNT-MCG CHEW Chew 1 tablet by mouth 2 (two) times daily.  . cholecalciferol (VITAMIN D) 1000 UNITS tablet Take 2,000 Units by mouth daily.  Marland Kitchen dicyclomine (BENTYL) 10 MG capsule Take 10 mg by mouth 4 (four) times daily -  before meals and at bedtime.  Marland Kitchen ibuprofen (ADVIL,MOTRIN) 200 MG tablet Take 600 mg by mouth every 6 (six) hours as needed for moderate pain.  Marland Kitchen lamoTRIgine (LAMICTAL) 150 MG tablet Take 2 tablets (300 mg total) by mouth daily.  Marland Kitchen lubiprostone (AMITIZA) 8 MCG capsule Take 8 mcg by mouth daily with breakfast.  . meloxicam (MOBIC) 15 MG tablet  Take 1 tablet (15 mg total) by mouth daily.  . Multiple Vitamins-Minerals (MULTIVITAL PO) Take 1 tablet by mouth 2 (two) times daily.  Marland Kitchen OLANZapine (ZYPREXA) 7.5 MG tablet Take 1 tablet (7.5 mg total) by mouth at bedtime.  . ondansetron (ZOFRAN) 8 MG tablet Take 8 mg by mouth 3 (three) times daily as needed for nausea or vomiting.   . pantoprazole (PROTONIX) 40 MG tablet Take 40 mg by mouth daily.   . promethazine (PHENERGAN) 25 MG tablet Take 1 tablet (25 mg total) by mouth every 6 (six) hours as needed for nausea or vomiting.  . ranitidine (ZANTAC) 150 MG tablet Take 150 mg by mouth 2 (two) times daily.  . sertraline (ZOLOFT) 100 MG tablet Take 1.5 tablets (150 mg total) by mouth daily.   No facility-administered encounter medications on file as of 04/20/2015.    Past Psychiatric History/Hospitalization(s): Anxiety: Yes Bipolar Disorder: No Depression: Yes Mania: No Psychosis: No Schizophrenia: No Personality Disorder: No Hospitalization for psychiatric illness: Yes numerous- 2 intensive eating program treatments in 2013 and 2014. BH eating d/o with Dr. Orland Dec Nov 2011. Multiple inpt hospitalizations at [facility] (2011, 2011, 2005), [facility] (3x), [facility], [place], [facility] (06/2013).  History of Electroconvulsive Shock Therapy: No Prior Suicide Attempts: No  Physical Exam: Constitutional:  LMP 03/31/2015 (Approximate)  Filed Vitals:   04/20/15 1553  Height: 5' 2.5" (1.588 m)  Weight: 106 lb 6.4 oz (48.263 kg)   Filed Vitals:   04/20/15 1553  BP: 104/64  Pulse: 94     General Appearance: alert, oriented, no acute distress  Musculoskeletal: Strength & Muscle Tone: within normal limits Gait & Station: normal Patient leans: N/A  Mental Status Examination/Evaluation: Objective: Attitude: Calm and cooperative  Appearance:  Fairly Groomed and Neat, thin. visible scars and scabs all over face. Appears older than stated age.   Eye Contact::  Good  Speech:  Clear and Coherent  and Normal Rate  Volume:  Normal  Mood:  depressed  Affect:  Congruent  Thought Process:  Goal Directed, Linear and Logical  Orientation:  Full (Time, Place, and Person)  Thought Content:  WDL  Suicidal Thoughts:  No  Homicidal Thoughts:  No  Judgement:  Fair  Insight:  Fair  Concentration: fair  Memory: Immediate-poor Recent-poor Remote-fair  Recall: fair  Language: fair  Gait and Station: normal  [employer] of Knowledge: average  Psychomotor Activity:  Normal  Akathisia:  No  Handed:  Right     Medical Decision Making (Choose Three): Review of Psycho-Social Stressors (1), Review or order clinical lab tests (1), Established Problem, Worsening (2), Review of Medication Regimen & Side Effects (2) and Review of New Medication or Change in Dosage (2)  Assessment: AXIS I  Axis I: Impulse control disorder, Anorexia Nervosa, Cannabis dependence, MDD- moderate, recurrent,  GAD, ADD- inattentive type  AXIS II  Deferred   AXIS III  Past Medical History    Diagnosis  Date    .  SMAS (superior mesenteric artery syndrome)     .  History of borderline personality disorder     .  GERD (  gastroesophageal reflux disease)     .  Hypotension     .  Osteopenia     .  Gastric ulcer due to Helicobacter pylori    AXIS IV  other psychosocial or environmental problems and problems with primary support group   AXIS V  51-60 moderate symptoms       Treatment Plan/Recommendations:  Plan of Care:   Medication management with supportive therapy. Risks/benefits and SE of the medication discussed. Pt verbalized understanding and verbal consent obtained for treatment.  Affirm with the patient that the medications are taken as ordered. Patient expressed understanding of how their medications were to be used.  -worsening of symptoms   Laboratory: reviewed labs glu 175, CBC WNL Order EKG  Psychotherapy:Therapy: brief supportive therapy provided. Discussed psychosocial stressors in detail.       Medications:   -Continue Lamictal 300mg  for mood stabalization - Zyprexa to 7.5mg  po qHS for mood stabalization, adjunct for depression and anxiety - increase Zoloft to 200mg  po qD for mood and anxiety - Buspar 10mg  po BID  for sleep - avoid stimulants if possible due to appetite suppression  -declined benzo request as pt would benefit more for from therapy   Routine PRN Medications: No  Consultations: encouraged pt to attend therapy with Dr. Theron Arista   Safety Concerns: Pt denies SI and is at an acute low risk for suicide.Patient told to call clinic if any problems occur. Patient advised to go to ER  if she should develop SI/HI, side effects, or if symptoms worsen. Has crisis numbers to call if needed. Pt verbalized understanding.   Other: F/up in 2 months or sooner if needed      Oletta Darter, MD 04/20/2015

## 2015-04-24 ENCOUNTER — Encounter (HOSPITAL_COMMUNITY): Payer: Self-pay | Admitting: Emergency Medicine

## 2015-04-24 ENCOUNTER — Emergency Department (HOSPITAL_COMMUNITY)
Admission: EM | Admit: 2015-04-24 | Discharge: 2015-04-24 | Discharge status: Against Medical Advice | Payer: Medicare Other | Attending: Emergency Medicine | Admitting: Emergency Medicine

## 2015-04-24 DIAGNOSIS — K5900 Constipation, unspecified: Secondary | ICD-10-CM | POA: Diagnosis present

## 2015-04-24 DIAGNOSIS — Z87891 Personal history of nicotine dependence: Secondary | ICD-10-CM | POA: Insufficient documentation

## 2015-04-24 DIAGNOSIS — R110 Nausea: Secondary | ICD-10-CM | POA: Diagnosis not present

## 2015-04-24 LAB — COMPREHENSIVE METABOLIC PANEL
ALT: 17 U/L (ref 14–54)
AST: 24 U/L (ref 15–41)
Albumin: 4.7 g/dL (ref 3.5–5.0)
Alkaline Phosphatase: 75 U/L (ref 38–126)
Anion gap: 8 (ref 5–15)
BUN: 11 mg/dL (ref 6–20)
CO2: 27 mmol/L (ref 22–32)
Calcium: 10.1 mg/dL (ref 8.9–10.3)
Chloride: 106 mmol/L (ref 101–111)
Creatinine, Ser: 0.76 mg/dL (ref 0.44–1.00)
GFR calc Af Amer: >60 mL/min (ref >60)
GFR calc non Af Amer: >60 mL/min (ref >60)
Glucose, Bld: 115 mg/dL — ABNORMAL HIGH (ref 65–99)
Potassium: 4.7 mmol/L (ref 3.5–5.1)
Sodium: 141 mmol/L (ref 135–145)
Total Bilirubin: 0.7 mg/dL (ref 0.3–1.2)
Total Protein: 7.7 g/dL (ref 6.5–8.1)

## 2015-04-24 LAB — CBC
HCT: 41.1 % (ref 36.0–46.0)
Hemoglobin: 13.5 g/dL (ref 12.0–15.0)
MCH: 29.5 pg (ref 26.0–34.0)
MCHC: 32.8 g/dL (ref 30.0–36.0)
MCV: 89.9 fL (ref 78.0–100.0)
Platelets: 253 10*3/uL (ref 150–400)
RBC: 4.57 MIL/uL (ref 3.87–5.11)
RDW: 12.6 % (ref 11.5–15.5)
WBC: 8.4 10*3/uL (ref 4.0–10.5)

## 2015-04-24 LAB — LIPASE, BLOOD: Lipase: 215 U/L — ABNORMAL HIGH (ref 22–51)

## 2015-04-24 LAB — I-STAT BETA HCG BLOOD, ED (MC, WL, AP ONLY): I-stat hCG, quantitative: <5 m[IU]/mL (ref <5)

## 2015-04-24 NOTE — ED Notes (Addendum)
Pt from home for eval of nausea and emesis x4 time today, pt also states constipation and lower abd pain, last BM on 04/19/15. nad noted. Pt alert and oriented.

## 2015-04-24 NOTE — ED Notes (Signed)
Patient had decided to leave stated that they couldn't wait any longer

## 2015-04-25 ENCOUNTER — Emergency Department (HOSPITAL_COMMUNITY)
Admission: EM | Admit: 2015-04-25 | Discharge: 2015-04-25 | Disposition: A | Discharge status: Home / Self Care | Payer: Medicare Other | Attending: Emergency Medicine | Admitting: Emergency Medicine

## 2015-04-25 ENCOUNTER — Emergency Department (HOSPITAL_COMMUNITY): Payer: Medicare Other

## 2015-04-25 ENCOUNTER — Encounter (HOSPITAL_COMMUNITY): Payer: Self-pay | Admitting: Emergency Medicine

## 2015-04-25 DIAGNOSIS — Z8619 Personal history of other infectious and parasitic diseases: Secondary | ICD-10-CM | POA: Insufficient documentation

## 2015-04-25 DIAGNOSIS — R109 Unspecified abdominal pain: Secondary | ICD-10-CM | POA: Diagnosis present

## 2015-04-25 DIAGNOSIS — Z880 Allergy status to penicillin: Secondary | ICD-10-CM | POA: Insufficient documentation

## 2015-04-25 DIAGNOSIS — R1084 Generalized abdominal pain: Secondary | ICD-10-CM | POA: Diagnosis not present

## 2015-04-25 DIAGNOSIS — K5900 Constipation, unspecified: Secondary | ICD-10-CM | POA: Diagnosis not present

## 2015-04-25 DIAGNOSIS — R1013 Epigastric pain: Secondary | ICD-10-CM

## 2015-04-25 DIAGNOSIS — Z79899 Other long term (current) drug therapy: Secondary | ICD-10-CM | POA: Insufficient documentation

## 2015-04-25 DIAGNOSIS — Z87891 Personal history of nicotine dependence: Secondary | ICD-10-CM | POA: Insufficient documentation

## 2015-04-25 DIAGNOSIS — K219 Gastro-esophageal reflux disease without esophagitis: Secondary | ICD-10-CM | POA: Insufficient documentation

## 2015-04-25 DIAGNOSIS — Z9884 Bariatric surgery status: Secondary | ICD-10-CM | POA: Diagnosis not present

## 2015-04-25 DIAGNOSIS — Z8659 Personal history of other mental and behavioral disorders: Secondary | ICD-10-CM | POA: Insufficient documentation

## 2015-04-25 DIAGNOSIS — M8580 Other specified disorders of bone density and structure, unspecified site: Secondary | ICD-10-CM | POA: Insufficient documentation

## 2015-04-25 DIAGNOSIS — Z3202 Encounter for pregnancy test, result negative: Secondary | ICD-10-CM | POA: Diagnosis not present

## 2015-04-25 LAB — CBC
HCT: 39.1 % (ref 36.0–46.0)
Hemoglobin: 13.1 g/dL (ref 12.0–15.0)
MCH: 30 pg (ref 26.0–34.0)
MCHC: 33.5 g/dL (ref 30.0–36.0)
MCV: 89.7 fL (ref 78.0–100.0)
Platelets: 242 10*3/uL (ref 150–400)
RBC: 4.36 MIL/uL (ref 3.87–5.11)
RDW: 12.6 % (ref 11.5–15.5)
WBC: 7.2 10*3/uL (ref 4.0–10.5)

## 2015-04-25 LAB — I-STAT BETA HCG BLOOD, ED (MC, WL, AP ONLY): I-stat hCG, quantitative: <5 m[IU]/mL (ref <5)

## 2015-04-25 LAB — COMPREHENSIVE METABOLIC PANEL
ALT: 15 U/L (ref 14–54)
AST: 20 U/L (ref 15–41)
Albumin: 4.4 g/dL (ref 3.5–5.0)
Alkaline Phosphatase: 69 U/L (ref 38–126)
Anion gap: 7 (ref 5–15)
BUN: 15 mg/dL (ref 6–20)
CO2: 26 mmol/L (ref 22–32)
Calcium: 9.4 mg/dL (ref 8.9–10.3)
Chloride: 104 mmol/L (ref 101–111)
Creatinine, Ser: 0.83 mg/dL (ref 0.44–1.00)
GFR calc Af Amer: >60 mL/min (ref >60)
GFR calc non Af Amer: >60 mL/min (ref >60)
Glucose, Bld: 106 mg/dL — ABNORMAL HIGH (ref 65–99)
Potassium: 4.3 mmol/L (ref 3.5–5.1)
Sodium: 137 mmol/L (ref 135–145)
Total Bilirubin: 0.8 mg/dL (ref 0.3–1.2)
Total Protein: 7.4 g/dL (ref 6.5–8.1)

## 2015-04-25 LAB — URINALYSIS, ROUTINE W REFLEX MICROSCOPIC
Bilirubin Urine: NEGATIVE
Glucose, UA: NEGATIVE mg/dL
Hgb urine dipstick: NEGATIVE
Ketones, ur: 15 mg/dL — AB
Leukocytes, UA: NEGATIVE
Nitrite: NEGATIVE
Protein, ur: NEGATIVE mg/dL
Specific Gravity, Urine: 1.009 (ref 1.005–1.030)
Urobilinogen, UA: 0.2 mg/dL (ref 0.0–1.0)
pH: 6.5 (ref 5.0–8.0)

## 2015-04-25 LAB — LIPASE, BLOOD: Lipase: 29 U/L (ref 22–51)

## 2015-04-25 MED ORDER — SODIUM CHLORIDE 0.9 % IV BOLUS (SEPSIS)
1000.0000 mL | Freq: Once | INTRAVENOUS | Status: AC
  Administered 2015-04-25: 1000 mL via INTRAVENOUS

## 2015-04-25 MED ORDER — ONDANSETRON HCL 4 MG/2ML IJ SOLN
4.0000 mg | Freq: Once | INTRAMUSCULAR | Status: AC | PRN
Start: 2015-04-25 — End: 2015-04-25
  Administered 2015-04-25: 4 mg via INTRAVENOUS
  Filled 2015-04-25: qty 2

## 2015-04-25 MED ORDER — PROMETHAZINE HCL 25 MG PO TABS: 25.0000 mg | ORAL_TABLET | Freq: Three times a day (TID) | ORAL | Status: DC | PRN

## 2015-04-25 MED ORDER — IOHEXOL 300 MG/ML  SOLN: 25.0000 mL | Freq: Once | INTRAMUSCULAR | Status: DC | PRN

## 2015-04-25 MED ORDER — IOHEXOL 300 MG/ML  SOLN
100.0000 mL | Freq: Once | INTRAMUSCULAR | Status: AC | PRN
  Administered 2015-04-25: 100 mL via INTRAVENOUS

## 2015-04-25 MED ORDER — LACTULOSE 10 GM/15ML PO SOLN: 10.0000 g | Freq: Three times a day (TID) | ORAL | Status: DC

## 2015-04-25 MED ORDER — IOHEXOL 300 MG/ML  SOLN
25.0000 mL | Freq: Once | INTRAMUSCULAR | Status: AC | PRN
  Administered 2015-04-25: 25 mL via ORAL

## 2015-04-25 MED ORDER — OXYCODONE-ACETAMINOPHEN 5-325 MG PO TABS
1.0000 | ORAL_TABLET | Freq: Once | ORAL | Status: AC
Start: 2015-04-25 — End: 2015-04-25
  Administered 2015-04-25: 1 via ORAL
  Filled 2015-04-25: qty 1

## 2015-04-25 MED ORDER — HYDROCODONE-ACETAMINOPHEN 5-325 MG PO TABS: 1.0000 | ORAL_TABLET | Freq: Four times a day (QID) | ORAL | Status: DC | PRN

## 2015-04-25 MED ORDER — MORPHINE SULFATE (PF) 4 MG/ML IV SOLN
4.0000 mg | Freq: Once | INTRAVENOUS | Status: AC
  Administered 2015-04-25: 4 mg via INTRAVENOUS
  Filled 2015-04-25: qty 1

## 2015-04-25 MED ORDER — FENTANYL CITRATE (PF) 100 MCG/2ML IJ SOLN
100.0000 ug | Freq: Once | INTRAMUSCULAR | Status: AC
  Administered 2015-04-25: 100 ug via INTRAVENOUS
  Filled 2015-04-25: qty 2

## 2015-04-25 NOTE — Discharge Instructions (Signed)
Return here as needed.  Follow-up with the GI Dr. provided and your primary care doctor

## 2015-04-25 NOTE — ED Notes (Signed)
Patient returned from CT

## 2015-04-25 NOTE — ED Notes (Signed)
Patient assisted to restroom without difficulty.

## 2015-04-25 NOTE — ED Notes (Signed)
Pt arrives with c/o constipation ongoing six days, states cold sweats, N/V three-four days. States no emesis since yesterday. Upon immediate assessment, pt hands RN paperwork regarding surgery on 2003?? Abdominal pain and discomfort. Seen at walk in clinic last night and told to come to ED to r/o pancreatitis.

## 2015-04-25 NOTE — ED Provider Notes (Signed)
CSN: [id number]     Arrival date & time 04/25/15  0604 History   First MD Initiated Contact with Patient 04/25/15 [phone]     Chief Complaint  Patient presents with  . Constipation     (Consider location/radiation/quality/duration/timing/severity/associated sxs/prior Treatment) HPI Patient presents to the emergency department with constipation and abdominal discomfort for the last week.  The patient, states she has also had nausea with vomiting over the last 3-4 days.  Patient, states she has had chronic abdominal issues.  She states that she was seen in a walk-in clinic and had an elevated lipase and was told to come to the emergency department for further evaluation.  Patient states that she did not take any medications prior to arrival.  Patient denies chest pain, shortness of breath, fever, diarrhea, bloody stool, hematemesis, dysuria, incontinence, back pain, neck pain, near syncope or syncope.  Patient states nothing seems make her condition better or worse Past Medical History  Diagnosis Date  . SMAS (superior mesenteric artery syndrome)   . History of borderline personality disorder   . GERD (gastroesophageal reflux disease)   . Hypotension   . Osteopenia   . Gastric ulcer due to Helicobacter pylori   . Anorexia nervosa without bulimia    Past Surgical History  Procedure Laterality Date  . Laparoscopic abdominal exploration  2003  . Gastric bypass  07/2002  . Dilation and curettage of uterus  2005   Family History  Problem Relation Age of Onset  . Alcohol abuse Mother   . Anxiety disorder Mother   . Depression Mother     severe with hx of suicide attempt. treated with ECT  . COPD Mother   . Anxiety disorder Father   . Drug abuse Father   . COPD Father   . Alcohol abuse Maternal Uncle   . Anxiety disorder Maternal Grandmother   . Depression Maternal Grandmother   . ADD / ADHD Brother    Social History  Substance Use Topics  . Smoking status: Former Smoker -- 0.25  packs/day for .5 years    Types: Cigarettes  . Smokeless tobacco: Never Used  . Alcohol Use: No   OB History    No data available     Review of Systems All other systems negative except as documented in the HPI. All pertinent positives and negatives as reviewed in the HPI.   Allergies  Bactrim; Penicillins; and Diflucan  Home Medications   Prior to Admission medications   Medication Sig Start Date End Date Taking? Authorizing Provider  albuterol (PROAIR HFA) 108 (90 BASE) MCG/ACT inhaler Inhale 2 puffs into the lungs every 6 (six) hours as needed for wheezing or shortness of breath. 11/15/14  Yes Leslye Peer, MD  busPIRone (BUSPAR) 10 MG tablet Take 1 tablet (10 mg total) by mouth 2 (two) times daily. 04/20/15  Yes Oletta Darter, MD  Calcium-Vitamin D-Vitamin K 500-100-40 MG-UNT-MCG CHEW Chew 1 tablet by mouth at bedtime.    Yes Historical Provider, MD  cholecalciferol (VITAMIN D) 1000 UNITS tablet Take 2,000 Units by mouth daily.   Yes Historical Provider, MD  dicyclomine (BENTYL) 10 MG capsule Take 10 mg by mouth 4 (four) times daily -  before meals and at bedtime.   Yes Historical Provider, MD  ibuprofen (ADVIL,MOTRIN) 200 MG tablet Take 800 mg by mouth every 6 (six) hours as needed for moderate pain.    Yes Historical Provider, MD  lamoTRIgine (LAMICTAL) 150 MG tablet Take 2 tablets (300 mg total)  by mouth daily. 04/20/15  Yes Oletta Darter, MD  lubiprostone (AMITIZA) 8 MCG capsule Take 8 mcg by mouth daily with breakfast.   Yes Historical Provider, MD  OLANZapine (ZYPREXA) 7.5 MG tablet Take 1 tablet (7.5 mg total) by mouth at bedtime. 04/20/15 04/19/16 Yes Oletta Darter, MD  ondansetron (ZOFRAN) 8 MG tablet Take 8 mg by mouth 3 (three) times daily as needed for nausea or vomiting.    Yes Historical Provider, MD  pantoprazole (PROTONIX) 40 MG tablet Take 40 mg by mouth 2 (two) times daily.  12/09/14  Yes Historical Provider, MD  ranitidine (ZANTAC) 150 MG tablet Take 150 mg by  mouth 2 (two) times daily.   Yes Historical Provider, MD  sertraline (ZOLOFT) 100 MG tablet Take 2 tablets (200 mg total) by mouth daily. Patient taking differently: Take 150 mg by mouth daily.  04/20/15 04/19/16 Yes Oletta Darter, MD  meloxicam (MOBIC) 15 MG tablet Take 1 tablet (15 mg total) by mouth daily. Patient not taking: Reported on 04/25/2015 02/21/15   Elvina Sidle, MD  Multiple Vitamins-Minerals (MULTIVITAL PO) Take 1 tablet by mouth 2 (two) times daily.    Historical Provider, MD  promethazine (PHENERGAN) 25 MG tablet Take 1 tablet (25 mg total) by mouth every 6 (six) hours as needed for nausea or vomiting. Patient not taking: Reported on 04/25/2015 04/14/15   Alvira Monday, MD   BP 105/73 mmHg  Pulse 62  Temp(Src) 98.1 F (36.7 C) (Oral)  Resp 16  Ht 5\' 3"  (1.6 m)  Wt 107 lb (48.535 kg)  BMI 18.96 kg/m2  SpO2 100%  LMP 03/17/2015 (Approximate) Physical Exam  Constitutional: She is oriented to person, place, and time. She appears well-developed and well-nourished. No distress.  HENT:  Head: Normocephalic and atraumatic.  Mouth/Throat: Oropharynx is clear and moist.  Eyes: Pupils are equal, round, and reactive to light.  Neck: Normal range of motion. Neck supple.  Cardiovascular: Normal rate, regular rhythm and normal heart sounds.  Exam reveals no gallop and no friction rub.   No murmur heard. Pulmonary/Chest: Effort normal and breath sounds normal. No respiratory distress.  Abdominal: Soft. Normal appearance and bowel sounds are normal. She exhibits no distension. There is generalized tenderness. There is no rebound and no guarding.  Neurological: She is alert and oriented to person, place, and time.  Skin: Skin is warm and dry. No rash noted. No erythema.  Nursing note and vitals reviewed.   ED Course  Procedures (including critical care time) Labs Review Labs Reviewed  COMPREHENSIVE METABOLIC PANEL - Abnormal; Notable for the following:    Glucose, Bld 106 (*)     All other components within normal limits  URINALYSIS, ROUTINE W REFLEX MICROSCOPIC (NOT AT [facility]) - Abnormal; Notable for the following:    Ketones, ur 15 (*)    All other components within normal limits  LIPASE, BLOOD  CBC  I-STAT BETA HCG BLOOD, ED ([facility], [facility], [facility] ONLY)    Imaging Review Ct Abdomen Pelvis W Contrast  04/25/2015   CLINICAL DATA:  Generalized abdominal pain.  Constipation.  EXAM: CT ABDOMEN AND PELVIS WITH CONTRAST  TECHNIQUE: Multidetector CT imaging of the abdomen and pelvis was performed using the standard protocol following bolus administration of intravenous contrast.  CONTRAST:  04/27/2015 OMNIPAQUE IOHEXOL 300 MG/ML  SOLN  COMPARISON:  CT scan of Dec 07, 2013.  FINDINGS: Visualized lung bases appear normal. No significant osseous abnormality is noted.  No gallstones are noted. The liver, spleen and pancreas appear  normal. Adrenal glands and kidneys appear normal. No hydronephrosis or renal obstruction is noted. No renal or ureteral calculi are noted. The appendix appears normal. There is no evidence of bowel obstruction. No abnormal fluid collection is noted. Mild urinary bladder distention is noted. Uterus and ovaries appear normal. No significant adenopathy is noted. There does not appear to be a significant amount of stool throughout the colon.  IMPRESSION: Mild distention of urinary bladder. No other abnormality seen in the abdomen or pelvis.   Electronically Signed   By: Lupita Raider, M.D.   On: 04/25/2015 11:44   I have personally reviewed and evaluated these images and lab results as part of my medical decision-making.   Patient has a negative CT scan will be treated for constipation.  Told to return here as needed.  Also advised her to follow-up with her primary care doctor.  Patient agrees the plan and all questions were answered   Charlestine Night, PA-C 05/02/15 2297  Leta Baptist, MD 05/06/15 [phone]

## 2015-04-25 NOTE — ED Notes (Signed)
Pt taken to CT.

## 2015-04-25 NOTE — ED Notes (Signed)
IV attempt X2 

## 2015-04-26 ENCOUNTER — Ambulatory Visit: Payer: 59 | Admitting: Psychiatry

## 2015-06-01 ENCOUNTER — Ambulatory Visit (HOSPITAL_COMMUNITY): Payer: Self-pay | Admitting: Psychiatry

## 2015-06-19 ENCOUNTER — Encounter (HOSPITAL_COMMUNITY): Payer: Self-pay | Admitting: Emergency Medicine

## 2015-06-19 ENCOUNTER — Emergency Department (HOSPITAL_COMMUNITY): Payer: Medicare Other

## 2015-06-19 ENCOUNTER — Emergency Department (HOSPITAL_COMMUNITY)
Admission: EM | Admit: 2015-06-19 | Discharge: 2015-06-19 | Disposition: A | Discharge status: Home / Self Care | Payer: Medicare Other | Attending: Emergency Medicine | Admitting: Emergency Medicine

## 2015-06-19 DIAGNOSIS — R6883 Chills (without fever): Secondary | ICD-10-CM

## 2015-06-19 DIAGNOSIS — D72829 Elevated white blood cell count, unspecified: Secondary | ICD-10-CM | POA: Diagnosis not present

## 2015-06-19 DIAGNOSIS — Z79899 Other long term (current) drug therapy: Secondary | ICD-10-CM | POA: Insufficient documentation

## 2015-06-19 DIAGNOSIS — Z87891 Personal history of nicotine dependence: Secondary | ICD-10-CM | POA: Insufficient documentation

## 2015-06-19 DIAGNOSIS — Z880 Allergy status to penicillin: Secondary | ICD-10-CM | POA: Insufficient documentation

## 2015-06-19 DIAGNOSIS — K219 Gastro-esophageal reflux disease without esophagitis: Secondary | ICD-10-CM | POA: Insufficient documentation

## 2015-06-19 DIAGNOSIS — Z3202 Encounter for pregnancy test, result negative: Secondary | ICD-10-CM | POA: Diagnosis not present

## 2015-06-19 LAB — LIPASE, BLOOD: Lipase: 30 U/L (ref 11–51)

## 2015-06-19 LAB — COMPREHENSIVE METABOLIC PANEL
ALT: 22 U/L (ref 14–54)
AST: 27 U/L (ref 15–41)
Albumin: 4.2 g/dL (ref 3.5–5.0)
Alkaline Phosphatase: 79 U/L (ref 38–126)
Anion gap: 8 (ref 5–15)
BUN: 13 mg/dL (ref 6–20)
CO2: 24 mmol/L (ref 22–32)
Calcium: 9.4 mg/dL (ref 8.9–10.3)
Chloride: 106 mmol/L (ref 101–111)
Creatinine, Ser: 0.75 mg/dL (ref 0.44–1.00)
GFR calc Af Amer: >60 mL/min (ref >60)
GFR calc non Af Amer: >60 mL/min (ref >60)
Glucose, Bld: 142 mg/dL — ABNORMAL HIGH (ref 65–99)
Potassium: 4 mmol/L (ref 3.5–5.1)
Sodium: 138 mmol/L (ref 135–145)
Total Bilirubin: 0.4 mg/dL (ref 0.3–1.2)
Total Protein: 6.8 g/dL (ref 6.5–8.1)

## 2015-06-19 LAB — CBC
HCT: 38.8 % (ref 36.0–46.0)
Hemoglobin: 12.9 g/dL (ref 12.0–15.0)
MCH: 29.8 pg (ref 26.0–34.0)
MCHC: 33.2 g/dL (ref 30.0–36.0)
MCV: 89.6 fL (ref 78.0–100.0)
Platelets: 291 10*3/uL (ref 150–400)
RBC: 4.33 MIL/uL (ref 3.87–5.11)
RDW: 12.8 % (ref 11.5–15.5)
WBC: 13.2 10*3/uL — ABNORMAL HIGH (ref 4.0–10.5)

## 2015-06-19 LAB — URINALYSIS, ROUTINE W REFLEX MICROSCOPIC
Bilirubin Urine: NEGATIVE
Glucose, UA: NEGATIVE mg/dL
Hgb urine dipstick: NEGATIVE
Ketones, ur: 15 mg/dL — AB
Leukocytes, UA: NEGATIVE
Nitrite: NEGATIVE
Protein, ur: NEGATIVE mg/dL
Specific Gravity, Urine: 1.019 (ref 1.005–1.030)
Urobilinogen, UA: 0.2 mg/dL (ref 0.0–1.0)
pH: 5.5 (ref 5.0–8.0)

## 2015-06-19 LAB — POC URINE PREG, ED: Preg Test, Ur: NEGATIVE

## 2015-06-19 NOTE — ED Notes (Signed)
Pt verbalized understanding of d/c instructions, prescriptions, and follow-up care. No further questions/concerns, VSS, ambulatory w/ steady gait (refused wheelchair) 

## 2015-06-19 NOTE — ED Notes (Signed)
States past 3 days waking up in the middle of the night having night sweats and chills. States once in a while having a dry cough.  Airway intact bilateral equal chest rise and fall.

## 2015-06-19 NOTE — ED Provider Notes (Signed)
CSN: [id number]     Arrival date & time 06/19/15  0845 History   First MD Initiated Contact with Patient 06/19/15 0945     Chief Complaint  Patient presents with  . Chills      The history is provided by the patient. No language interpreter was used.   [name] is a 39 year old woman with history of anorexia, gastric bypass, SMAS here for evaluation of chills. She reports 3 days of chills and night sweats. She reports occasional intermittent dry cough. No reported fevers but she is not checking her temperature at home. She has chronic epigastric abdominal pain that radiates to her back, this is unchanged from her baseline. She reports sick contacts with URI symptoms, but she does not have similar symptoms. Symptoms are mild, intermittent, worsening.  Past Medical History  Diagnosis Date  . SMAS (superior mesenteric artery syndrome) (HCC)   . History of borderline personality disorder   . GERD (gastroesophageal reflux disease)   . Hypotension   . Osteopenia   . Gastric ulcer due to Helicobacter pylori   . Anorexia nervosa without bulimia    Past Surgical History  Procedure Laterality Date  . Laparoscopic abdominal exploration  2003  . Gastric bypass  07/2002  . Dilation and curettage of uterus  2005   Family History  Problem Relation Age of Onset  . Alcohol abuse Mother   . Anxiety disorder Mother   . Depression Mother     severe with hx of suicide attempt. treated with ECT  . COPD Mother   . Anxiety disorder Father   . Drug abuse Father   . COPD Father   . Alcohol abuse Maternal Uncle   . Anxiety disorder Maternal Grandmother   . Depression Maternal Grandmother   . ADD / ADHD Brother    Social History  Substance Use Topics  . Smoking status: Former Smoker -- 0.25 packs/day for .5 years    Types: Cigarettes  . Smokeless tobacco: Never Used  . Alcohol Use: No   OB History    No data available     Review of Systems  All other systems reviewed and are  negative.     Allergies  Bactrim; Penicillins; Ciprofloxacin; and Diflucan  Home Medications   Prior to Admission medications   Medication Sig Start Date End Date Taking? Authorizing Provider  albuterol (PROAIR HFA) 108 (90 BASE) MCG/ACT inhaler Inhale 2 puffs into the lungs every 6 (six) hours as needed for wheezing or shortness of breath. 11/15/14  Yes Leslye Peer, MD  busPIRone (BUSPAR) 10 MG tablet Take 1 tablet (10 mg total) by mouth 2 (two) times daily. 04/20/15  Yes Oletta Darter, MD  Calcium-Vitamin D-Vitamin K 500-100-40 MG-UNT-MCG CHEW Chew 1 tablet by mouth at bedtime.    Yes Historical Provider, MD  cholecalciferol (VITAMIN D) 1000 UNITS tablet Take 2,000 Units by mouth daily.   Yes Historical Provider, MD  dicyclomine (BENTYL) 10 MG capsule Take 10 mg by mouth 4 (four) times daily -  before meals and at bedtime.   Yes Historical Provider, MD  ibuprofen (ADVIL,MOTRIN) 200 MG tablet Take 800 mg by mouth every 6 (six) hours as needed for moderate pain.    Yes Historical Provider, MD  lamoTRIgine (LAMICTAL) 150 MG tablet Take 2 tablets (300 mg total) by mouth daily. 04/20/15  Yes Oletta Darter, MD  lubiprostone (AMITIZA) 8 MCG capsule Take 8 mcg by mouth daily with breakfast.   Yes Historical Provider, MD  meloxicam (  MOBIC) 15 MG tablet Take 1 tablet (15 mg total) by mouth daily. 02/21/15  Yes Elvina Sidle, MD  Multiple Vitamins-Minerals (MULTIVITAL PO) Take 1 tablet by mouth 2 (two) times daily.   Yes Historical Provider, MD  OLANZapine (ZYPREXA) 7.5 MG tablet Take 1 tablet (7.5 mg total) by mouth at bedtime. 04/20/15 04/19/16 Yes Oletta Darter, MD  ondansetron (ZOFRAN) 8 MG tablet Take 8 mg by mouth 3 (three) times daily as needed for nausea or vomiting.    Yes Historical Provider, MD  pantoprazole (PROTONIX) 40 MG tablet Take 40 mg by mouth 2 (two) times daily.  12/09/14  Yes Historical Provider, MD  promethazine (PHENERGAN) 25 MG tablet Take 1 tablet (25 mg total) by mouth  every 8 (eight) hours as needed for nausea or vomiting. 04/25/15  Yes Charlestine Night, PA-C  ranitidine (ZANTAC) 150 MG tablet Take 150 mg by mouth 2 (two) times daily.   Yes Historical Provider, MD  sertraline (ZOLOFT) 100 MG tablet Take 2 tablets (200 mg total) by mouth daily. 04/20/15 04/19/16 Yes Oletta Darter, MD   BP 110/71 mmHg  Pulse 79  Temp(Src) 98.4 F (36.9 C) (Oral)  Resp 16  Ht 5\' 3"  (1.6 m)  Wt 107 lb (48.535 kg)  BMI 18.96 kg/m2  SpO2 95%  LMP 06/18/2015 Physical Exam  Constitutional: She is oriented to person, place, and time. She appears well-developed and well-nourished.  HENT:  Head: Normocephalic and atraumatic.  Right Ear: External ear normal.  Left Ear: External ear normal.  Cardiovascular: Normal rate and regular rhythm.   No murmur heard. Pulmonary/Chest: Effort normal and breath sounds normal. No respiratory distress.  Abdominal: Soft. There is no rebound and no guarding.  Mild epigastric tenderness  Musculoskeletal: She exhibits no edema or tenderness.  Neurological: She is alert and oriented to person, place, and time.  Skin: Skin is warm and dry.  Multiple areas on the face that have been picked up a small amount of dried blood. There is no surrounding erythema or induration.  Psychiatric: She has a normal mood and affect. Her behavior is normal.  Nursing note and vitals reviewed.   ED Course  Procedures (including critical care time) Labs Review Labs Reviewed  COMPREHENSIVE METABOLIC PANEL - Abnormal; Notable for the following:    Glucose, Bld 142 (*)    All other components within normal limits  CBC - Abnormal; Notable for the following:    WBC 13.2 (*)    All other components within normal limits  URINALYSIS, ROUTINE W REFLEX MICROSCOPIC (NOT AT [facility]) - Abnormal; Notable for the following:    Ketones, ur 15 (*)    All other components within normal limits  LIPASE, BLOOD  POC URINE PREG, ED    Imaging Review Dg Chest 2  View  06/19/2015  CLINICAL DATA:  Cough and nocturnal chills for the past several days without shortness of breath, history of previous smoking. EXAM: CHEST  2 VIEW COMPARISON:  PA and lateral chest x-ray of September 13, 2014 FINDINGS: The ROM lungs remain hyperinflated with mild hemidiaphragm flattening and increased AP dimension of the thorax. There is no focal infiltrate. There is no pleural effusion. The heart and pulmonary vascularity are normal. The mediastinum is normal in width. There is no pleural effusion. The bony thorax exhibits no acute abnormality. IMPRESSION: COPD/ reactive airway disease. There is no active cardiopulmonary disease. Electronically Signed   By: David  September 15, 2014 M.D.   On: 06/19/2015 10:32   I have personally reviewed  and evaluated these images and lab results as part of my medical decision-making.   EKG Interpretation None      MDM   Final diagnoses:  Chills (without fever)  Leukocytosis    Patient here for evaluation of chills and night sweats. She is nontoxic appearing on examination with no focal evidence of infection. CBC was leukocytosis, unclear etiology. Discussed with patient PCP follow-up as well as close return precautions for evidence of new or worrisome symptoms.    Tilden Fossa, MD 06/19/15 1122

## 2015-06-19 NOTE — Discharge Instructions (Signed)
The cause of your symptoms was not identified today. Get rechecked immediately if you develop any new or worrisome symptoms. Your white blood cell count was elevated today at 13. Please get this rechecked by her family doctor later this week.   Leukocytosis Leukocytosis means you have more white blood cells than normal. White blood cells are made in your bone marrow. The main job of white blood cells is to fight infection. Having too many white blood cells is a common condition. It can develop as a result of many types of medical problems. CAUSES  In some cases, your bone marrow may be normal, but it is still making too many white blood cells. This could be the result of:  Infection.  Injury.  Physical stress.  Emotional stress.  Surgery.  Allergic reactions.  Tumors that do not start in the blood or bone marrow.  An inherited disease.  Certain medicines.  Pregnancy and labor. In other cases, you may have a bone marrow disorder that is causing your body to make too many white blood cells. Bone marrow disorders include:  Leukemia. This is a type of blood cancer.  Myeloproliferative disorders. These disorders cause blood cells to grow abnormally. SYMPTOMS  Some people have no symptoms. Others have symptoms due to the medical problem that is causing their leukocytosis. These symptoms may include:  Bleeding.  Bruising.  Fever.  Night sweats.  Repeated infections.  Weakness.  Weight loss. DIAGNOSIS  Leukocytosis is often found during blood tests that are done as part of a normal physical exam. Your caregiver will probably order other tests to help determine why you have too many white blood cells. These tests may include:  A complete blood count (CBC). This test measures all the types of blood cells in your body.  Chest X-rays, urine tests (urinalysis), or other tests to look for signs of infection.  Bone marrow aspiration. For this test, a needle is put into your  bone. Cells from the bone marrow are removed through the needle. The cells are then examined under a microscope. TREATMENT  Treatment is usually not needed for leukocytosis. However, if a disorder is causing your leukocytosis, it will need to be treated. Treatment may include:  Antibiotic medicines if you have a bacterial infection.  Bone marrow transplant. Your diseased bone marrow is replaced with healthy cells that will grow new bone marrow.  Chemotherapy. This is the use of drugs to kill cancer cells. HOME CARE INSTRUCTIONS  Only take over-the-counter or prescription medicines as directed by your caregiver.  Maintain a healthy weight. Ask your caregiver what weight is best for you.  Eat foods that are low in saturated fats and high in fiber. Eat plenty of fruits and vegetables.  Drink enough fluids to keep your urine clear or pale yellow.  Get 30 minutes of exercise at least 5 times a week. Check with your caregiver before starting a new exercise routine.  Limit caffeine and alcohol.  Do not smoke.  Keep all follow-up appointments as directed by your caregiver. SEEK MEDICAL CARE IF:  You feel weak or more tired than usual.  You develop chills, a cough, or nasal congestion.  You lose weight without trying.  You have night sweats.  You bruise easily. SEEK IMMEDIATE MEDICAL CARE IF:  You bleed more than normal.  You have chest pain.  You have trouble breathing.  You have a fever.  You have uncontrolled nausea or vomiting.  You feel dizzy or lightheaded. MAKE SURE  YOU:  Understand these instructions.  Will watch your condition.  Will get help right away if you are not doing well or get worse.   This information is not intended to replace advice given to you by your health care provider. Make sure you discuss any questions you have with your health care provider.   Document Released: 07/11/2011 Document Revised: 10/14/2011 Document Reviewed:  01/23/2015 Elsevier Interactive Patient Education [employer]

## 2015-06-22 ENCOUNTER — Ambulatory Visit (INDEPENDENT_AMBULATORY_CARE_PROVIDER_SITE_OTHER): Payer: 59 | Admitting: Psychiatry

## 2015-06-22 ENCOUNTER — Encounter (HOSPITAL_COMMUNITY): Payer: Self-pay | Admitting: Psychiatry

## 2015-06-22 ENCOUNTER — Telehealth (HOSPITAL_COMMUNITY): Payer: Self-pay

## 2015-06-22 VITALS — BP 111/77 | HR 89 | Ht 63.0 in | Wt 107.0 lb

## 2015-06-22 DIAGNOSIS — F639 Impulse disorder, unspecified: Secondary | ICD-10-CM | POA: Diagnosis not present

## 2015-06-22 DIAGNOSIS — F900 Attention-deficit hyperactivity disorder, predominantly inattentive type: Secondary | ICD-10-CM

## 2015-06-22 DIAGNOSIS — F331 Major depressive disorder, recurrent, moderate: Secondary | ICD-10-CM | POA: Diagnosis not present

## 2015-06-22 DIAGNOSIS — G4700 Insomnia, unspecified: Secondary | ICD-10-CM | POA: Diagnosis not present

## 2015-06-22 DIAGNOSIS — F411 Generalized anxiety disorder: Secondary | ICD-10-CM

## 2015-06-22 DIAGNOSIS — F5000 Anorexia nervosa, unspecified: Secondary | ICD-10-CM

## 2015-06-22 MED ORDER — LAMOTRIGINE 150 MG PO TABS: 300.0000 mg | ORAL_TABLET | Freq: Every day | ORAL | Status: DC

## 2015-06-22 MED ORDER — SERTRALINE HCL 100 MG PO TABS: 200.0000 mg | ORAL_TABLET | Freq: Every day | ORAL | Status: DC

## 2015-06-22 MED ORDER — BREXPIPRAZOLE 1 MG PO TABS: 1.0000 mg | ORAL_TABLET | Freq: Every day | ORAL | Status: DC

## 2015-06-22 MED ORDER — CLONAZEPAM 1 MG PO TABS: 1.0000 mg | ORAL_TABLET | Freq: Every day | ORAL | Status: DC | PRN

## 2015-06-22 MED ORDER — BUSPIRONE HCL 10 MG PO TABS: 10.0000 mg | ORAL_TABLET | Freq: Two times a day (BID) | ORAL | Status: DC

## 2015-06-22 NOTE — Progress Notes (Signed)
Patient ID: Karen Hahn, female   DOB: 01-08-1976, 39 y.o.   MRN: [id number]  [facility] [id number] Progress Note  Karen Hahn [id number] 39 y.o.  06/22/2015 8:54 AM  Chief Complaint: "not good"  History of Present Illness: Pt reports she has night sweats and elevated sugar levels. Pt also has enlarged bladder and will be having surgery soon. Pt is working with her PCP. Pt is having bowel incontinence and is seeing a GI doc. States it has improved some..   Pt is fighting with her roommate and best friend. Pt states she is angry and irritable. Pt is lashing out vocally but not physically. They have a puppy who is causing a lot of problems.    Pt states she is depressed. It is getting worse. Pt has been isolating herself and is endorsing anhedonia. She is still not working and has no desire to do anything. Pt is overly sensitive. Pt sits on the couch all day. Denies worthlessness and hopelessness.   Her disability has been approved.  Pt has a 53 week old puppy and is alone with her all day.    Appetite is good and she is eating 3 meals/day. Pt is 107 lbs and wants to increase to 115lbs but is not gaining weight. She is eating higher calorie foods and is considering whey protein. Energy is variable. Sleeping poorly due to night sweats.   Anxiety is increased and the level is thru the roof. She is picking at her skin a couple of hours a day. It makes her feel bad about her self. States picking at her scabs gives her relief.  Concentration remains poor. She is easily distracted and has poor memory.   States she is taking Lamictal and  Zyprexa and Zoloft as prescribed and denies SE.  Pt is in therapy once a month. States she wants to work on her anger and focus problems in therapy.   Suicidal Ideation: No Plan Formed: No Patient has means to carry out plan: No  Homicidal Ideation: No Plan Formed: No Patient has means to carry out plan: No  Review of  Systems: Psychiatric: Agitation: Yes Hallucination: No Depressed Mood: Yes Insomnia: Yes Hypersomnia: No Altered Concentration: Yes Feels Worthless: No Grandiose Ideas: No Belief In Special Powers: No New/Increased Substance Abuse: No Compulsions: Yes  Neurologic: Headache: No Seizure: No Paresthesias: No   Review of Systems  Constitutional: Positive for chills. Negative for fever and weight loss.  HENT: Negative for congestion, ear pain, nosebleeds and sore throat.   Eyes: Negative for blurred vision, double vision and pain.  Respiratory: Negative for cough, sputum production and wheezing.   Cardiovascular: Negative for chest pain, palpitations and leg swelling.  Gastrointestinal: Positive for nausea. Negative for heartburn, vomiting, abdominal pain and diarrhea.  Musculoskeletal: Negative for back pain, joint pain and neck pain.  Skin: Positive for rash. Negative for itching.  Neurological: Negative for dizziness, tremors, sensory change, seizures, loss of consciousness and headaches.  Psychiatric/Behavioral: Positive for depression. Negative for suicidal ideas, hallucinations and substance abuse. The patient is nervous/anxious and has insomnia.      Past Medical, Family, Social History: Endorsing family hx of depression, ADD, alcohol abuse and anxiety disorder. Pt is living with her friend in [place], [place]. She is divorced and was married for 5 yrs. Pt has a GED. Currently unemployed and on disability. Pt smokes a few cigs a week. She smokes THC thru the day. Denies alcohol use.   Past Medical History  Diagnosis Date  . SMAS (superior mesenteric artery syndrome) (HCC)   . History of borderline personality disorder   . GERD (gastroesophageal reflux disease)   . Hypotension   . Osteopenia   . Gastric ulcer due to Helicobacter pylori   . Anorexia nervosa without bulimia   . Bladder problem     Outpatient Encounter Prescriptions as of 06/22/2015  Medication Sig  .  albuterol (PROAIR HFA) 108 (90 BASE) MCG/ACT inhaler Inhale 2 puffs into the lungs every 6 (six) hours as needed for wheezing or shortness of breath.  . busPIRone (BUSPAR) 10 MG tablet Take 1 tablet (10 mg total) by mouth 2 (two) times daily.  . Calcium-Vitamin D-Vitamin K 500-100-40 MG-UNT-MCG CHEW Chew 1 tablet by mouth at bedtime.   . cholecalciferol (VITAMIN D) 1000 UNITS tablet Take 2,000 Units by mouth daily.  Marland Kitchen dicyclomine (BENTYL) 10 MG capsule Take 10 mg by mouth 4 (four) times daily -  before meals and at bedtime.  Marland Kitchen ibuprofen (ADVIL,MOTRIN) 200 MG tablet Take 800 mg by mouth every 6 (six) hours as needed for moderate pain.   Marland Kitchen lamoTRIgine (LAMICTAL) 150 MG tablet Take 2 tablets (300 mg total) by mouth daily.  Marland Kitchen lubiprostone (AMITIZA) 8 MCG capsule Take 8 mcg by mouth daily with breakfast.  . Multiple Vitamins-Minerals (MULTIVITAL PO) Take 1 tablet by mouth 2 (two) times daily.  Marland Kitchen OLANZapine (ZYPREXA) 7.5 MG tablet Take 1 tablet (7.5 mg total) by mouth at bedtime.  . ondansetron (ZOFRAN) 8 MG tablet Take 8 mg by mouth 3 (three) times daily as needed for nausea or vomiting.   . pantoprazole (PROTONIX) 40 MG tablet Take 40 mg by mouth 2 (two) times daily.   . ranitidine (ZANTAC) 150 MG tablet Take 150 mg by mouth 2 (two) times daily.  . sertraline (ZOLOFT) 100 MG tablet Take 2 tablets (200 mg total) by mouth daily.  . meloxicam (MOBIC) 15 MG tablet Take 1 tablet (15 mg total) by mouth daily. (Patient not taking: Reported on 06/22/2015)  . promethazine (PHENERGAN) 25 MG tablet Take 1 tablet (25 mg total) by mouth every 8 (eight) hours as needed for nausea or vomiting. (Patient not taking: Reported on 06/22/2015)   No facility-administered encounter medications on file as of 06/22/2015.    Past Psychiatric History/Hospitalization(s): Anxiety: Yes Bipolar Disorder: No Depression: Yes Mania: No Psychosis: No Schizophrenia: No Personality Disorder: No Hospitalization for psychiatric  illness: Yes numerous- 2 intensive eating program treatments in 2013 and 2014. BH eating d/o with Dr. Orland Dec Nov 2011. Multiple inpt hospitalizations at [facility] (2011, 2011, 2005), [place] (3x), [facility], [place], [place] (06/2013).  History of Electroconvulsive Shock Therapy: No Prior Suicide Attempts: No  Physical Exam: Constitutional:  BP 111/77 mmHg  Pulse 89  Ht 5\' 3"  (1.6 m)  Wt 107 lb (48.535 kg)  BMI 18.96 kg/m2  LMP 06/18/2015  Filed Vitals:   06/22/15 0844  Height: 5\' 3"  (1.6 m)  Weight: 107 lb (48.535 kg)   Filed Vitals:   06/22/15 0844  BP: 111/77  Pulse: 89     General Appearance: alert, oriented, no acute distress  Musculoskeletal: Strength & Muscle Tone: within normal limits Gait & Station: normal Patient leans: N/A  Mental Status Examination/Evaluation: Objective: Attitude: Calm and cooperative  Appearance: Fairly Groomed and Neat, thin. visible scars and scabs all over face. Appears older than stated age.   Eye Contact::  Good  Speech:  Clear and Coherent and Normal Rate  Volume:  Normal  Mood:  Depressed and irritable  Affect:  Congruent  Thought Process:  Goal Directed  Orientation:  Full (Time, Place, and Person)  Thought Content:  Negative  Suicidal Thoughts:  No  Homicidal Thoughts:  No  Judgement:  Fair  Insight:  Fair  Concentration: good  Memory: Immediate-fair to poor Recent-fair Remote-fair  Recall: fair  Language: fair  Gait and Station: normal  [employer] of Knowledge: average  Psychomotor Activity:  Normal  Akathisia:  No  Handed:  Right  AIMS (if indicated):  Facial and Oral Movements  Muscles of Facial Expression: None, normal  Lips and Perioral Area: None, normal  Jaw: None, normal  Tongue: None, normal Extremity Movements: Upper (arms, wrists, hands, fingers): None, normal  Lower (legs, knees, ankles, toes): None, normal,  Trunk Movements:  Neck, shoulders, hips: None, normal,  Overall Severity : Severity of  abnormal movements (highest score from questions above): None, normal  Incapacitation due to abnormal movements: None, normal  Patient's awareness of abnormal movements (rate only patient's report): No Awareness, Dental Status  Current problems with teeth and/or dentures?: No  Does patient usually wear dentures?: No    Assets:  Communication Skills Desire for Improvement Financial Resources/Insurance Housing Social Support Architect (Choose Three): Review of Psycho-Social Stressors (1), Review or order clinical lab tests (1), Established Problem, Worsening (2), Review of Medication Regimen & Side Effects (2) and Review of New Medication or Change in Dosage (2)  Assessment: AXIS I  Axis I: Impulse control disorder, Anorexia Nervosa, Cannabis dependence, MDD- moderate, recurrent,  GAD, ADD- inattentive type  AXIS II  Deferred   AXIS III  Past Medical History    Diagnosis  Date    .  SMAS (superior mesenteric artery syndrome)     .  History of borderline personality disorder     .  GERD (gastroesophageal reflux disease)     .  Hypotension     .  Osteopenia     .  Gastric ulcer due to Helicobacter pylori    AXIS IV  other psychosocial or environmental problems and problems with primary support group   AXIS V  51-60 moderate symptoms       Treatment Plan/Recommendations:  Plan of Care:   Medication management with supportive therapy. Risks/benefits and SE of the medication discussed. Pt verbalized understanding and verbal consent obtained for treatment.  Affirm with the patient that the medications are taken as ordered. Patient expressed understanding of how their medications were to be used.  -worsening of symptoms   Laboratory: reviewed labs glu 142,, WBC elevated, urine preg neg Order EKG  Psychotherapy:Therapy: brief supportive therapy provided. Discussed psychosocial stressors in detail.      Medications:   -Continue  Lamictal 300mg  for mood stabalization - d/c Zyprexa  -start trial of Rexulti 1mg  and after one week increase to 2mg  po qHS for mood stabalization, adjunct for depression and anxiety. Pt given discount coupon -  Zoloft 200mg  po qD for mood and anxiety - Buspar 10mg  po BID for sleep -given 30 tabs of Klonopin 1mg  po qD prn anxiety. Script will not be renewed - avoid stimulants if possible due to appetite suppression   Routine PRN Medications: No  Consultations: encouraged pt to attend therapy with Dr.   Safety Concerns: Pt denies SI and is at an acute low risk for suicide.Patient told to call clinic if any problems occur. Patient advised to go to  ER  if she should develop SI/HI, side effects, or if symptoms worsen. Has crisis numbers to call if needed. Pt verbalized understanding.   Other: F/up in 2 months or sooner if needed      Oletta Darter, MD 06/22/2015

## 2015-06-22 NOTE — Telephone Encounter (Signed)
Telephone call with patient with Dr. Michae Kava after patient left a message she would not be able to get Rexulti through her Medicare insurance.  Patient stated she could not get the prescription filled even with a coupon provided by Dr. Michae Kava earlier this date and informed per Dr. Michae Kava to keep taking her Zyprexa for now and Dr Michae Kava would call her to discuss other options.   Patient agreed with plan and will continue Zyprexa until speaks more with Dr. Michae Kava about other medication options.

## 2015-06-27 ENCOUNTER — Telehealth (HOSPITAL_COMMUNITY): Payer: Self-pay | Admitting: Psychiatry

## 2015-06-27 NOTE — Telephone Encounter (Signed)
Called and left voice message for call back to clinic.  

## 2015-06-27 NOTE — Telephone Encounter (Signed)
Called and left voice message for pt to call clinic back.

## 2015-07-04 ENCOUNTER — Ambulatory Visit: Payer: 59 | Admitting: Psychiatry

## 2015-07-04 NOTE — Telephone Encounter (Signed)
Called and left voice message for pt to call back clinic. 

## 2015-07-11 ENCOUNTER — Ambulatory Visit: Payer: 59 | Admitting: Psychiatry

## 2015-07-20 ENCOUNTER — Encounter (HOSPITAL_COMMUNITY): Payer: Self-pay | Admitting: Psychiatry

## 2015-07-20 ENCOUNTER — Ambulatory Visit (INDEPENDENT_AMBULATORY_CARE_PROVIDER_SITE_OTHER): Payer: 59 | Admitting: Psychiatry

## 2015-07-20 VITALS — BP 120/76 | HR 108 | Ht 63.0 in | Wt 104.2 lb

## 2015-07-20 DIAGNOSIS — G4700 Insomnia, unspecified: Secondary | ICD-10-CM

## 2015-07-20 DIAGNOSIS — F411 Generalized anxiety disorder: Secondary | ICD-10-CM | POA: Diagnosis not present

## 2015-07-20 DIAGNOSIS — F332 Major depressive disorder, recurrent severe without psychotic features: Secondary | ICD-10-CM

## 2015-07-20 DIAGNOSIS — F639 Impulse disorder, unspecified: Secondary | ICD-10-CM

## 2015-07-20 DIAGNOSIS — F5000 Anorexia nervosa, unspecified: Secondary | ICD-10-CM

## 2015-07-20 DIAGNOSIS — F900 Attention-deficit hyperactivity disorder, predominantly inattentive type: Secondary | ICD-10-CM

## 2015-07-20 MED ORDER — BUSPIRONE HCL 15 MG PO TABS: 15.0000 mg | ORAL_TABLET | Freq: Two times a day (BID) | ORAL | Status: DC

## 2015-07-20 MED ORDER — CLONAZEPAM 1 MG PO TABS: 1.0000 mg | ORAL_TABLET | Freq: Every day | ORAL | Status: DC | PRN

## 2015-07-20 MED ORDER — ARIPIPRAZOLE 5 MG PO TABS: 5.0000 mg | ORAL_TABLET | Freq: Every day | ORAL | Status: DC

## 2015-07-20 NOTE — Progress Notes (Signed)
[facility] [address] Progress Note  Karen Hahn [id number] 39 y.o.  07/20/2015 2:08 PM  Chief Complaint: "not good"  History of Present Illness: Pt was not able to fill the Rexulti due to cost and has been taking Zyprexa.   Pt reports the night sweats have resolved. Pt also has enlarged bladder and will be having surgery soon. Pt is working with her PCP. Pt was having bowel incontinence and is seeing a GI doc who is helping her.   The fighting with her roommate and best friend has decreased. Pt remains angry and irritable. Pt is lashing out vocally but not physically. They have less puppy who is causing a lot of problems.    Pt states she is depressed. It is getting worse. Pt has been isolating herself and is endorsing anhedonia. She is still not working and has no desire to do anything. Pt is trying to get out more. Pt is overly sensitive. Pt sits on the couch all day. Denies worthlessness and hopelessness.    Appetite is good and she is eating 3 meals/day. Pt is 104 lbs today and wants to increase to 115lbs but is not gaining weight. She is eating higher calorie foods and is considering whey protein. Pt is eating normal size meals 3x/day. Pt is not eating snacks.  Energy is variable. Sleeping ok and getting about 5-6 hrs/night.   Anxiety is increased and the level is thru the roof. She is picking at her skin a couple of hours a day. It makes her feel bad about her self. States picking at her scabs gives her relief.  Concentration remains poor. She is easily distracted and has poor memory.   States she is taking Lamictal and  Zyprexa and Zoloft as prescribed and denies SE.  Pt is in therapy once a month. States she wants to work on her anger and focus problems in therapy.   Suicidal Ideation: No Plan Formed: No Patient has means to carry out plan: No  Homicidal Ideation: No Plan Formed: No Patient has means to carry out plan: No  Review of  Systems: Psychiatric: Agitation: Yes Hallucination: No Depressed Mood: Yes Insomnia: No Hypersomnia: No Altered Concentration: Yes Feels Worthless: No Grandiose Ideas: No Belief In Special Powers: No New/Increased Substance Abuse: No Compulsions: Yes  Neurologic: Headache: No Seizure: No Paresthesias: No   Review of Systems  Constitutional: Positive for chills and weight loss. Negative for fever.  HENT: Negative for congestion, ear pain, nosebleeds and sore throat.   Eyes: Negative for blurred vision, double vision and pain.  Respiratory: Negative for cough, sputum production and wheezing.   Cardiovascular: Negative for chest pain, palpitations and leg swelling.  Gastrointestinal: Positive for heartburn. Negative for nausea, vomiting, abdominal pain and diarrhea.  Musculoskeletal: Negative for back pain, joint pain and neck pain.  Skin: Negative for itching and rash.  Neurological: Negative for dizziness, tremors, sensory change, seizures, loss of consciousness and headaches.  Psychiatric/Behavioral: Positive for depression. Negative for suicidal ideas, hallucinations and substance abuse. The patient is nervous/anxious and has insomnia.      Past Medical, Family, Social History: Endorsing family hx of depression, ADD, alcohol abuse and anxiety disorder. Pt is living with her friend in [place], [place]. She is divorced and was married for 5 yrs. Pt has a GED. Currently unemployed and on disability. Pt smokes a few cigs a week. She smokes THC thru the day. Denies alcohol use.   Past Medical History  Diagnosis Date  .  SMAS (superior mesenteric artery syndrome) (HCC)   . History of borderline personality disorder   . GERD (gastroesophageal reflux disease)   . Hypotension   . Osteopenia   . Gastric ulcer due to Helicobacter pylori   . Anorexia nervosa without bulimia   . Bladder problem     Outpatient Encounter Prescriptions as of 07/20/2015  Medication Sig  . albuterol  (PROAIR HFA) 108 (90 BASE) MCG/ACT inhaler Inhale 2 puffs into the lungs every 6 (six) hours as needed for wheezing or shortness of breath.  . busPIRone (BUSPAR) 10 MG tablet Take 1 tablet (10 mg total) by mouth 2 (two) times daily.  . clonazePAM (KLONOPIN) 1 MG tablet Take 1 tablet (1 mg total) by mouth daily as needed for anxiety.  . dicyclomine (BENTYL) 10 MG capsule Take 10 mg by mouth 4 (four) times daily -  before meals and at bedtime.  Marland Kitchen ibuprofen (ADVIL,MOTRIN) 200 MG tablet Take 800 mg by mouth every 6 (six) hours as needed for moderate pain. Reported on 07/20/2015  . lubiprostone (AMITIZA) 8 MCG capsule Take 8 mcg by mouth daily with breakfast.  . Multiple Vitamins-Minerals (MULTIVITAL PO) Take 1 tablet by mouth 2 (two) times daily.  Marland Kitchen OLANZapine (ZYPREXA) 7.5 MG tablet Take 7.5 mg by mouth at bedtime.  . ondansetron (ZOFRAN) 8 MG tablet Take 8 mg by mouth 3 (three) times daily as needed for nausea or vomiting.   . pantoprazole (PROTONIX) 40 MG tablet Take 40 mg by mouth 2 (two) times daily.   . sertraline (ZOLOFT) 100 MG tablet Take 2 tablets (200 mg total) by mouth daily.  . Brexpiprazole (REXULTI) 1 MG TABS Take 1 mg by mouth daily. Take 1mg  for 1 week then increase to 2 tabs daily (Patient not taking: Reported on 07/20/2015)  . Calcium-Vitamin D-Vitamin K 500-100-40 MG-UNT-MCG CHEW Chew 1 tablet by mouth at bedtime. Reported on 07/20/2015  . cholecalciferol (VITAMIN D) 1000 UNITS tablet Take 2,000 Units by mouth daily. Reported on 07/20/2015  . lamoTRIgine (LAMICTAL) 150 MG tablet Take 2 tablets (300 mg total) by mouth daily. (Patient not taking: Reported on 07/20/2015)  . meloxicam (MOBIC) 15 MG tablet Take 1 tablet (15 mg total) by mouth daily. (Patient not taking: Reported on 07/20/2015)  . promethazine (PHENERGAN) 25 MG tablet Take 1 tablet (25 mg total) by mouth every 8 (eight) hours as needed for nausea or vomiting. (Patient not taking: Reported on 06/22/2015)  . ranitidine  (ZANTAC) 150 MG tablet Take 150 mg by mouth 2 (two) times daily. Reported on 07/20/2015   No facility-administered encounter medications on file as of 07/20/2015.    Past Psychiatric History/Hospitalization(s): Anxiety: Yes Bipolar Disorder: No Depression: Yes Mania: No Psychosis: No Schizophrenia: No Personality Disorder: No Hospitalization for psychiatric illness: Yes numerous- 2 intensive eating program treatments in 2013 and 2014. BH eating d/o with Dr. Orland Dec Nov 2011. Multiple inpt hospitalizations at [facility] (2011, 2011, 2005), [place] (3x), [facility], [place], [place] (06/2013).  History of Electroconvulsive Shock Therapy: No Prior Suicide Attempts: No  Previous meds: Risperdal, Zyprexa, Seroquel  Physical Exam: Constitutional:  BP 120/76 mmHg  Pulse 108  Ht 5\' 3"  (1.6 m)  Wt 104 lb 3.2 oz (47.265 kg)  BMI 18.46 kg/m2  Filed Vitals:   07/20/15 1402  Height: 5\' 3"  (1.6 m)  Weight: 104 lb 3.2 oz (47.265 kg)   Filed Vitals:   07/20/15 1402  BP: 120/76  Pulse: 108     General Appearance: alert, oriented, no  acute distress  Musculoskeletal: Strength & Muscle Tone: within normal limits Gait & Station: normal Patient leans: N/A  Mental Status Examination/Evaluation: Objective: Attitude: Calm and cooperative  Appearance: Fairly Groomed and Neat, thin. visible scars and scabs all over face. Appears older than stated age.   Eye Contact::  Good  Speech:  Clear and Coherent and Normal Rate  Volume:  Normal  Mood:  Depressed and irritable  Affect:  Congruent  Thought Process:  Goal Directed  Orientation:  Full (Time, Place, and Person)  Thought Content:  Negative  Suicidal Thoughts:  No  Homicidal Thoughts:  No  Judgement:  Fair  Insight:  Fair  Concentration: good  Memory: Immediate-fair to poor Recent-fair Remote-fair  Recall: fair  Language: fair  Gait and Station: normal  [employer] of Knowledge: average  Psychomotor Activity:  Normal  Akathisia:   No  Handed:  Right  AIMS (if indicated):  Facial and Oral Movements  Muscles of Facial Expression: None, normal  Lips and Perioral Area: None, normal  Jaw: None, normal  Tongue: None, normal Extremity Movements: Upper (arms, wrists, hands, fingers): None, normal  Lower (legs, knees, ankles, toes): None, normal,  Trunk Movements:  Neck, shoulders, hips: None, normal,  Overall Severity : Severity of abnormal movements (highest score from questions above): None, normal  Incapacitation due to abnormal movements: None, normal  Patient's awareness of abnormal movements (rate only patient's report): No Awareness, Dental Status  Current problems with teeth and/or dentures?: No  Does patient usually wear dentures?: No    Assets:  Communication Skills Desire for Improvement Financial Resources/Insurance Housing Social Support Architect (Choose Three): Review of Psycho-Social Stressors (1), Review or order clinical lab tests (1), Established Problem, Worsening (2), Review of Medication Regimen & Side Effects (2) and Review of New Medication or Change in Dosage (2)  Assessment: AXIS I  Axis I: Impulse control disorder, Anorexia Nervosa, Cannabis dependence, MDD- moderate, recurrent,  GAD, ADD- inattentive type  AXIS II  Deferred       Treatment Plan/Recommendations:  Plan of Care:   Medication management with supportive therapy. Risks/benefits and SE of the medication discussed. Pt verbalized understanding and verbal consent obtained for treatment.  Affirm with the patient that the medications are taken as ordered. Patient expressed understanding of how their medications were to be used.  -worsening of symptoms   Laboratory: reviewed labs glu 142,, WBC elevated, urine preg neg Order EKG  Psychotherapy:Therapy: brief supportive therapy provided. Discussed psychosocial stressors in detail.      Medications:   -Continue Lamictal   for mood stabalization - d/c Zyprexa  -start trial of Abilify  mg po qHS for mood stabalization, adjunct for depression and anxiety.  -  Zoloft  po qD for mood and anxiety - increase Buspar to  po BID for sleep -given 30 tabs of Klonopin  po qD prn anxiety. Script will not be renewed - avoid stimulants if possible due to appetite suppression   Routine PRN Medications: No  Consultations: encouraged pt to attend therapy with Dr. Theron Arista   Safety Concerns: Pt denies SI and is at an acute low risk for suicide.Patient told to call clinic if any problems occur. Patient advised to go to ER  if she should develop SI/HI, side effects, or if symptoms worsen. Has crisis numbers to call if needed. Pt verbalized understanding.   Other: F/up in 1 months or sooner if needed  Oletta Darter, MD 07/20/2015

## 2015-07-20 NOTE — Patient Instructions (Signed)
1. EKG call [phone]

## 2015-07-21 ENCOUNTER — Other Ambulatory Visit (HOSPITAL_COMMUNITY): Payer: Self-pay

## 2015-07-21 ENCOUNTER — Ambulatory Visit (HOSPITAL_COMMUNITY)
Admission: RE | Admit: 2015-07-21 | Discharge: 2015-07-21 | Disposition: A | Discharge status: Home / Self Care | Payer: Medicare Other | Source: Ambulatory Visit | Attending: Psychiatry | Admitting: Psychiatry

## 2015-07-21 DIAGNOSIS — Z79899 Other long term (current) drug therapy: Secondary | ICD-10-CM | POA: Diagnosis not present

## 2015-07-26 ENCOUNTER — Telehealth (HOSPITAL_COMMUNITY): Payer: Self-pay

## 2015-07-26 NOTE — Telephone Encounter (Signed)
Medication management - Telephone call with patient requesting Dr. Michae Kava place her back on Daytrana patch and attempt to have authorized as reports could never get covered in the past.  Reports she is going to push her insurance to cover.  Discussed with patient the medication was ordered one time on 07/14/14 and patient stated "my ADHD is out of control" and states she needs it.  Agreed to send request to Dr. Michae Kava to see if she wanted to prescribe again or wait to discuss with patient at next evaluation.

## 2015-07-27 ENCOUNTER — Other Ambulatory Visit (HOSPITAL_COMMUNITY): Payer: Self-pay | Admitting: Psychiatry

## 2015-07-27 DIAGNOSIS — F900 Attention-deficit hyperactivity disorder, predominantly inattentive type: Secondary | ICD-10-CM

## 2015-07-27 MED ORDER — METHYLPHENIDATE 10 MG/9HR TD PTCH: 10.0000 mg | MEDICATED_PATCH | Freq: Every day | TRANSDERMAL | Status: DC

## 2015-08-16 ENCOUNTER — Other Ambulatory Visit: Payer: Self-pay | Admitting: Urology

## 2015-08-22 ENCOUNTER — Ambulatory Visit (HOSPITAL_COMMUNITY): Payer: Self-pay | Admitting: Psychiatry

## 2015-08-24 ENCOUNTER — Ambulatory Visit (INDEPENDENT_AMBULATORY_CARE_PROVIDER_SITE_OTHER): Payer: 59 | Admitting: Psychiatry

## 2015-08-24 ENCOUNTER — Encounter (HOSPITAL_COMMUNITY): Payer: Self-pay | Admitting: Psychiatry

## 2015-08-24 VITALS — BP 115/84 | HR 93 | Ht 63.0 in | Wt 96.6 lb

## 2015-08-24 DIAGNOSIS — F331 Major depressive disorder, recurrent, moderate: Secondary | ICD-10-CM

## 2015-08-24 DIAGNOSIS — F639 Impulse disorder, unspecified: Secondary | ICD-10-CM | POA: Diagnosis not present

## 2015-08-24 DIAGNOSIS — F411 Generalized anxiety disorder: Secondary | ICD-10-CM | POA: Diagnosis not present

## 2015-08-24 DIAGNOSIS — F332 Major depressive disorder, recurrent severe without psychotic features: Secondary | ICD-10-CM | POA: Insufficient documentation

## 2015-08-24 DIAGNOSIS — G4700 Insomnia, unspecified: Secondary | ICD-10-CM | POA: Diagnosis not present

## 2015-08-24 DIAGNOSIS — F900 Attention-deficit hyperactivity disorder, predominantly inattentive type: Secondary | ICD-10-CM

## 2015-08-24 DIAGNOSIS — F5000 Anorexia nervosa, unspecified: Secondary | ICD-10-CM

## 2015-08-24 MED ORDER — BUSPIRONE HCL 15 MG PO TABS: 15.0000 mg | ORAL_TABLET | Freq: Two times a day (BID) | ORAL | Status: DC

## 2015-08-24 MED ORDER — LITHIUM CARBONATE ER 300 MG PO TBCR: 300.0000 mg | EXTENDED_RELEASE_TABLET | Freq: Every day | ORAL | Status: DC

## 2015-08-24 MED ORDER — CLONAZEPAM 1 MG PO TABS: 1.0000 mg | ORAL_TABLET | Freq: Every day | ORAL | Status: DC | PRN

## 2015-08-24 MED ORDER — LAMOTRIGINE 150 MG PO TABS: 300.0000 mg | ORAL_TABLET | Freq: Every day | ORAL | Status: DC

## 2015-08-24 MED ORDER — OLANZAPINE 7.5 MG PO TABS: 7.5000 mg | ORAL_TABLET | Freq: Every day | ORAL | Status: DC

## 2015-08-24 MED ORDER — SERTRALINE HCL 100 MG PO TABS: 200.0000 mg | ORAL_TABLET | Freq: Every day | ORAL | Status: DC

## 2015-08-24 NOTE — Progress Notes (Signed)
Patient ID: Karen Hahn, female   DOB: 1976/03/24, 40 y.o.   MRN: [id number]   [facility] [address] Progress Note  Karen Hahn [id number] 40 y.o.  08/24/2015 10:08 AM  Chief Complaint: "very depressed lately"  History of Present Illness: Pt was not able to fill the Daytrana because insurance would not cover it. Concentration remains poor. She is easily distracted and has poor memory.   The fighting with her roommate and best friend has decreased. Pt remains irritable and is upset all the time. Pt is lashing out vocally but not physically.   Pt states she is depressed. It is getting worse. Pt has been isolating herself and is endorsing anhedonia. Pt is overly sensitive. She is still not working and has no desire to do anything. Pt is not going out and spends her time doing nothing. Pt sits on the couch all day. Denies worthlessness and hopelessness.   Appetite is poor and she is eating 1 meals/day. Pt is 96 lbs today and wants to increase to 115lbs but is not gaining weight. Pt has lost 8 lbs since last visit in mid December. Pt is restricting but only eating one decent meal at dinner. Reports her friend and brother in law died recently. Pt spent a lot of time with her mother in [place] and her mom doesn't eat much.   Energy is poor. Sleeping poorly and getting about 3-4 hrs/night.   Anxiety is increased and the level is thru the roof. She is picking at her skin a couple of hours a day. It makes her feel bad about herself. States picking at her scabs gives her relief.  States she is taking Lamictal and  Zyprexa and Zoloft as prescribed and denies SE.  Pt has missed several appointment.  Suicidal Ideation: No Plan Formed: No Patient has means to carry out plan: No  Homicidal Ideation: No Plan Formed: No Patient has means to carry out plan: No  Review of Systems: Psychiatric: Agitation: Yes Hallucination: No Depressed Mood: Yes Insomnia: No Hypersomnia: No Altered  Concentration: Yes Feels Worthless: No Grandiose Ideas: No Belief In Special Powers: No New/Increased Substance Abuse: No Compulsions: Yes  Neurologic: Headache: No Seizure: No Paresthesias: No   Review of Systems  Constitutional: Positive for chills and weight loss. Negative for fever.  HENT: Negative for congestion, ear pain, nosebleeds and sore throat.   Eyes: Negative for blurred vision, double vision and pain.  Respiratory: Negative for cough, sputum production and wheezing.   Cardiovascular: Negative for chest pain, palpitations and leg swelling.  Gastrointestinal: Positive for heartburn. Negative for nausea, vomiting, abdominal pain and diarrhea.  Musculoskeletal: Negative for back pain, joint pain and neck pain.  Skin: Negative for itching and rash.  Neurological: Negative for dizziness, tremors, sensory change, seizures, loss of consciousness and headaches.  Psychiatric/Behavioral: Positive for depression. Negative for suicidal ideas, hallucinations and substance abuse. The patient is nervous/anxious and has insomnia.      Past Medical, Family, Social History: Endorsing family hx of depression, ADD, alcohol abuse and anxiety disorder. Pt is living with her friend in [place], [place]. She is divorced and was married for 5 yrs. Pt has a GED. Currently unemployed and on disability. Pt smokes a few cigs a week. She smokes THC thru the day. Denies alcohol use.   Past Medical History  Diagnosis Date  . SMAS (superior mesenteric artery syndrome) (HCC)   . History of borderline personality disorder   . GERD (gastroesophageal reflux disease)   .  Hypotension   . Osteopenia   . Gastric ulcer due to Helicobacter pylori   . Anorexia nervosa without bulimia   . Bladder problem     Outpatient Encounter Prescriptions as of 08/24/2015  Medication Sig  . albuterol (PROAIR HFA) 108 (90 BASE) MCG/ACT inhaler Inhale 2 puffs into the lungs every 6 (six) hours as needed for wheezing or  shortness of breath.  . ARIPiprazole (ABILIFY) 5 MG tablet Take 1 tablet (5 mg total) by mouth daily.  . busPIRone (BUSPAR) 15 MG tablet Take 1 tablet (15 mg total) by mouth 2 (two) times daily.  . Calcium-Vitamin D-Vitamin K 500-100-40 MG-UNT-MCG CHEW Chew 1 tablet by mouth at bedtime. Reported on 07/20/2015  . cholecalciferol (VITAMIN D) 1000 UNITS tablet Take 2,000 Units by mouth daily. Reported on 07/20/2015  . clonazePAM (KLONOPIN) 1 MG tablet Take 1 tablet (1 mg total) by mouth daily as needed for anxiety.  . dicyclomine (BENTYL) 10 MG capsule Take 10 mg by mouth 4 (four) times daily -  before meals and at bedtime.  Marland Kitchen ibuprofen (ADVIL,MOTRIN) 200 MG tablet Take 800 mg by mouth every 6 (six) hours as needed for moderate pain. Reported on 07/20/2015  . lamoTRIgine (LAMICTAL) 150 MG tablet Take 2 tablets (300 mg total) by mouth daily.  Marland Kitchen lubiprostone (AMITIZA) 8 MCG capsule Take 8 mcg by mouth daily with breakfast.  . Multiple Vitamins-Minerals (MULTIVITAL PO) Take 1 tablet by mouth 2 (two) times daily.  . ondansetron (ZOFRAN) 8 MG tablet Take 8 mg by mouth 3 (three) times daily as needed for nausea or vomiting.   . pantoprazole (PROTONIX) 40 MG tablet Take 40 mg by mouth 2 (two) times daily.   . ranitidine (ZANTAC) 150 MG tablet Take 150 mg by mouth 2 (two) times daily. Reported on 07/20/2015  . sertraline (ZOLOFT) 100 MG tablet Take 2 tablets (200 mg total) by mouth daily.  . meloxicam (MOBIC) 15 MG tablet Take 1 tablet (15 mg total) by mouth daily. (Patient not taking: Reported on 07/20/2015)  . methylphenidate (DAYTRANA) 10 mg/9hr patch Place 1 patch (10 mg total) onto the skin daily. wear patch for 9 hours only each day (Patient not taking: Reported on 08/24/2015)  . promethazine (PHENERGAN) 25 MG tablet Take 1 tablet (25 mg total) by mouth every 8 (eight) hours as needed for nausea or vomiting. (Patient not taking: Reported on 06/22/2015)   No facility-administered encounter medications on  file as of 08/24/2015.    Past Psychiatric History/Hospitalization(s): Anxiety: Yes Bipolar Disorder: No Depression: Yes Mania: No Psychosis: No Schizophrenia: No Personality Disorder: No Hospitalization for psychiatric illness: Yes numerous- 2 intensive eating program treatments in 2013 and 2014. BH eating d/o with Dr. Orland Dec Nov 2011. Multiple inpt hospitalizations at [facility] (2011, 2011, 2005), [place] (3x), [facility], [place], [place] (06/2013).  History of Electroconvulsive Shock Therapy: No Prior Suicide Attempts: No  Previous meds: Risperdal, Zyprexa, Seroquel, Paxil, Prozac, Remeron, Cymbalta  Physical Exam: Constitutional:  BP 115/84 mmHg  Pulse 93  Ht 5\' 3"  (1.6 m)  Wt 96 lb 9.6 oz (43.817 kg)  BMI 17.12 kg/m2  Filed Vitals:   08/24/15 1031  Height: 5\' 3"  (1.6 m)  Weight: 96 lb 9.6 oz (43.817 kg)   Filed Vitals:   08/24/15 1031  BP: 115/84  Pulse: 93     General Appearance: alert, oriented, no acute distress  Musculoskeletal: Strength & Muscle Tone: within normal limits Gait & Station: normal Patient leans: N/A  Mental Status Examination/Evaluation: Objective:  Attitude: Calm and cooperative  Appearance: Fairly Groomed and Neat, thin. visible scars and scabs all over face. Appears older than stated age.   Eye Contact::  Good  Speech:  Clear and Coherent and Normal Rate  Volume:  Normal  Mood:  Depressed   Affect:  Congruent  Thought Process:  Goal Directed  Orientation:  Full (Time, Place, and Person)  Thought Content:  Negative  Suicidal Thoughts:  No  Homicidal Thoughts:  No  Judgement:  Fair  Insight:  Fair  Concentration: good  Memory: Immediate-fair to poor Recent-fair Remote-fair  Recall: fair  Language: fair  Gait and Station: normal  [employer] of Knowledge: average  Psychomotor Activity:  Normal  Akathisia:  No  Handed:  Right  AIMS (if indicated):  Facial and Oral Movements  Muscles of Facial Expression: None, normal  Lips and  Perioral Area: None, normal  Jaw: None, normal  Tongue: None, normal Extremity Movements: Upper (arms, wrists, hands, fingers): None, normal  Lower (legs, knees, ankles, toes): None, normal,  Trunk Movements:  Neck, shoulders, hips: None, normal,  Overall Severity : Severity of abnormal movements (highest score from questions above): None, normal  Incapacitation due to abnormal movements: None, normal  Patient's awareness of abnormal movements (rate only patient's report): No Awareness, Dental Status  Current problems with teeth and/or dentures?: No  Does patient usually wear dentures?: No    Assets:  Communication Skills Desire for Improvement Financial Resources/Insurance Housing Social Support Architect (Choose Three): Review of Psycho-Social Stressors (1), Review or order clinical lab tests (1), Review and summation of old records (2), Established Problem, Worsening (2), Review of Last Therapy Session (1), Review of Medication Regimen & Side Effects (2) and Review of New Medication or Change in Dosage (2)  Assessment: AXIS I  Axis I: Impulse control disorder, Anorexia Nervosa, Cannabis dependence, MDD- moderate, recurrent,  GAD, ADD- inattentive type, Insomnia  AXIS II  Deferred       Treatment Plan/Recommendations:  Plan of Care:   Medication management with supportive therapy. Risks/benefits and SE of the medication discussed. Pt verbalized understanding and verbal consent obtained for treatment.  Affirm with the patient that the medications are taken as ordered. Patient expressed understanding of how their medications were to be used.  -worsening of symptoms   Laboratory: reviewed labs glu 142,, WBC elevated, urine preg neg Order EKG-reminded pt to get EKG ASAP  Psychotherapy:Therapy: brief supportive therapy provided. Discussed psychosocial stressors in detail.      Medications:   -Continue Lamictal 300mg  for  mood stabilization -start trial of Lithium 300mg  po qD for depression -Zyprexa 7.5mg  po qHS for mood stabalization, adjunct for depression and anxiety.  -  Zoloft 200mg  po qD for mood and anxiety - Buspar to 15mg  po BID for sleep -given 30 tabs of Klonopin 1mg  po qD prn anxiety. Script will not be renewed - avoid stimulants if possible due to appetite suppression -d/c Daytrana patch as it was not approved by PCP  Routine PRN Medications: No  Consultations: given info for therapist and information for for eating disorders. Filled out form online for [facility] to contact pt for treatment.    Safety Concerns: Pt denies SI and is at an acute low risk for suicide.Patient told to call clinic if any problems occur. Patient advised to go to ER  if she should develop SI/HI, side effects, or if symptoms worsen. Has  crisis numbers to call if needed. Pt verbalized understanding.   Other: F/up in 1 months or sooner if needed      Oletta Darter, MD 08/24/2015

## 2015-08-28 ENCOUNTER — Emergency Department (HOSPITAL_COMMUNITY)
Admission: EM | Admit: 2015-08-28 | Discharge: 2015-08-28 | Disposition: A | Discharge status: Home / Self Care | Payer: Medicare Other | Attending: Emergency Medicine | Admitting: Emergency Medicine

## 2015-08-28 ENCOUNTER — Encounter (HOSPITAL_COMMUNITY): Payer: Self-pay | Admitting: Emergency Medicine

## 2015-08-28 DIAGNOSIS — Z79899 Other long term (current) drug therapy: Secondary | ICD-10-CM | POA: Diagnosis not present

## 2015-08-28 DIAGNOSIS — R630 Anorexia: Secondary | ICD-10-CM | POA: Diagnosis not present

## 2015-08-28 DIAGNOSIS — F1210 Cannabis abuse, uncomplicated: Secondary | ICD-10-CM | POA: Diagnosis not present

## 2015-08-28 DIAGNOSIS — Z3202 Encounter for pregnancy test, result negative: Secondary | ICD-10-CM | POA: Diagnosis not present

## 2015-08-28 DIAGNOSIS — Z880 Allergy status to penicillin: Secondary | ICD-10-CM | POA: Diagnosis not present

## 2015-08-28 DIAGNOSIS — F1310 Sedative, hypnotic or anxiolytic abuse, uncomplicated: Secondary | ICD-10-CM | POA: Diagnosis not present

## 2015-08-28 DIAGNOSIS — Z8679 Personal history of other diseases of the circulatory system: Secondary | ICD-10-CM | POA: Insufficient documentation

## 2015-08-28 DIAGNOSIS — M8580 Other specified disorders of bone density and structure, unspecified site: Secondary | ICD-10-CM | POA: Diagnosis not present

## 2015-08-28 DIAGNOSIS — Z8619 Personal history of other infectious and parasitic diseases: Secondary | ICD-10-CM | POA: Insufficient documentation

## 2015-08-28 DIAGNOSIS — K219 Gastro-esophageal reflux disease without esophagitis: Secondary | ICD-10-CM | POA: Insufficient documentation

## 2015-08-28 DIAGNOSIS — F603 Borderline personality disorder: Secondary | ICD-10-CM | POA: Diagnosis not present

## 2015-08-28 DIAGNOSIS — E860 Dehydration: Secondary | ICD-10-CM | POA: Insufficient documentation

## 2015-08-28 DIAGNOSIS — F332 Major depressive disorder, recurrent severe without psychotic features: Secondary | ICD-10-CM | POA: Diagnosis not present

## 2015-08-28 DIAGNOSIS — R531 Weakness: Secondary | ICD-10-CM | POA: Diagnosis present

## 2015-08-28 DIAGNOSIS — Z87448 Personal history of other diseases of urinary system: Secondary | ICD-10-CM | POA: Diagnosis not present

## 2015-08-28 DIAGNOSIS — Z87891 Personal history of nicotine dependence: Secondary | ICD-10-CM | POA: Insufficient documentation

## 2015-08-28 LAB — COMPREHENSIVE METABOLIC PANEL
ALT: 13 U/L — ABNORMAL LOW (ref 14–54)
AST: 18 U/L (ref 15–41)
Albumin: 3.8 g/dL (ref 3.5–5.0)
Alkaline Phosphatase: 67 U/L (ref 38–126)
Anion gap: 8 (ref 5–15)
BUN: 19 mg/dL (ref 6–20)
CO2: 25 mmol/L (ref 22–32)
Calcium: 9.3 mg/dL (ref 8.9–10.3)
Chloride: 107 mmol/L (ref 101–111)
Creatinine, Ser: 0.66 mg/dL (ref 0.44–1.00)
GFR calc Af Amer: >60 mL/min (ref >60)
GFR calc non Af Amer: >60 mL/min (ref >60)
Glucose, Bld: 94 mg/dL (ref 65–99)
Potassium: 4.2 mmol/L (ref 3.5–5.1)
Sodium: 140 mmol/L (ref 135–145)
Total Bilirubin: 0.4 mg/dL (ref 0.3–1.2)
Total Protein: 6.5 g/dL (ref 6.5–8.1)

## 2015-08-28 LAB — CBC WITH DIFFERENTIAL/PLATELET
Basophils Absolute: 0.1 10*3/uL (ref 0.0–0.1)
Basophils Relative: 1 %
Eosinophils Absolute: 0.3 10*3/uL (ref 0.0–0.7)
Eosinophils Relative: 4 %
HCT: 33.1 % — ABNORMAL LOW (ref 36.0–46.0)
Hemoglobin: 11.1 g/dL — ABNORMAL LOW (ref 12.0–15.0)
Lymphocytes Relative: 35 %
Lymphs Abs: 2.9 10*3/uL (ref 0.7–4.0)
MCH: 30 pg (ref 26.0–34.0)
MCHC: 33.5 g/dL (ref 30.0–36.0)
MCV: 89.5 fL (ref 78.0–100.0)
Monocytes Absolute: 0.6 10*3/uL (ref 0.1–1.0)
Monocytes Relative: 7 %
Neutro Abs: 4.4 10*3/uL (ref 1.7–7.7)
Neutrophils Relative %: 53 %
Platelets: 223 10*3/uL (ref 150–400)
RBC: 3.7 MIL/uL — ABNORMAL LOW (ref 3.87–5.11)
RDW: 12.7 % (ref 11.5–15.5)
WBC: 8.3 10*3/uL (ref 4.0–10.5)

## 2015-08-28 LAB — URINALYSIS, ROUTINE W REFLEX MICROSCOPIC
Bilirubin Urine: NEGATIVE
Glucose, UA: NEGATIVE mg/dL
Hgb urine dipstick: NEGATIVE
Ketones, ur: NEGATIVE mg/dL
Leukocytes, UA: NEGATIVE
Nitrite: NEGATIVE
Protein, ur: NEGATIVE mg/dL
Specific Gravity, Urine: 1.012 (ref 1.005–1.030)
pH: 6.5 (ref 5.0–8.0)

## 2015-08-28 LAB — RAPID URINE DRUG SCREEN, HOSP PERFORMED
Amphetamines: NOT DETECTED
Barbiturates: NOT DETECTED
Benzodiazepines: POSITIVE — AB
Cocaine: NOT DETECTED
Opiates: NOT DETECTED
Tetrahydrocannabinol: POSITIVE — AB

## 2015-08-28 LAB — PREGNANCY, URINE: Preg Test, Ur: NEGATIVE

## 2015-08-28 LAB — ETHANOL: Alcohol, Ethyl (B): <5 mg/dL (ref <5)

## 2015-08-28 MED ORDER — SODIUM CHLORIDE 0.9 % IV BOLUS (SEPSIS)
1000.0000 mL | Freq: Once | INTRAVENOUS | Status: AC
  Administered 2015-08-28: 1000 mL via INTRAVENOUS

## 2015-08-28 NOTE — ED Provider Notes (Signed)
CSN: [id number]     Arrival date & time 08/28/15  0105 History  By signing my name below, I, Iona Beard, attest that this documentation has been prepared under the direction and in the presence of Azalia Bilis, MD.   Electronically Signed: Iona Beard, ED Scribe. 08/28/2015. 2:13 AM     Chief Complaint  Patient presents with  . Drug Problem    The history is provided by the patient. No language interpreter was used.    HPI Comments: Karen Hahn is a 40 y.o. female brought in by ambulance who presents to the Emergency Department complaining of gradual onset, constant, generalized weakness and fatigue after taking antidepressant medication, yesterday. Pt cannot recall name of medication. Pt also complains nausea, vomiting, and loss of appetite. Pt last vomited three days ago. Pt claims that she has not been drinking enough and is worried that she may be dehydrated. Denies hematochezia, diarrhea, melena, or urinary symptoms. Pt denies EtOH or drug use today.   Past Medical History  Diagnosis Date  . SMAS (superior mesenteric artery syndrome) (HCC)   . History of borderline personality disorder   . GERD (gastroesophageal reflux disease)   . Hypotension   . Osteopenia   . Gastric ulcer due to Helicobacter pylori   . Anorexia nervosa without bulimia   . Bladder problem    Past Surgical History  Procedure Laterality Date  . Laparoscopic abdominal exploration  2003  . Gastric bypass  07/2002  . Dilation and curettage of uterus  2005   Family History  Problem Relation Age of Onset  . Alcohol abuse Mother   . Anxiety disorder Mother   . Depression Mother     severe with hx of suicide attempt. treated with ECT  . COPD Mother   . Anxiety disorder Father   . Drug abuse Father   . COPD Father   . Alcohol abuse Maternal Uncle   . Anxiety disorder Maternal Grandmother   . Depression Maternal Grandmother   . ADD / ADHD Brother    Social History  Substance Use Topics   . Smoking status: Former Smoker -- 0.25 packs/day for .5 years    Types: Cigarettes  . Smokeless tobacco: Never Used  . Alcohol Use: No   OB History    No data available     Review of Systems  A complete 10 system review of systems was obtained and all systems are negative except as noted in the HPI and PMH.    Allergies  Bactrim; Penicillins; Ciprofloxacin; and Diflucan  Home Medications   Prior to Admission medications   Medication Sig Start Date End Date Taking? Authorizing Provider  albuterol (PROAIR HFA) 108 (90 BASE) MCG/ACT inhaler Inhale 2 puffs into the lungs every 6 (six) hours as needed for wheezing or shortness of breath. 11/15/14   Leslye Peer, MD  busPIRone (BUSPAR) 15 MG tablet Take 1 tablet (15 mg total) by mouth 2 (two) times daily. 08/24/15   Oletta Darter, MD  Calcium-Vitamin D-Vitamin K 500-100-40 MG-UNT-MCG CHEW Chew 1 tablet by mouth at bedtime. Reported on 07/20/2015    Historical Provider, MD  cholecalciferol (VITAMIN D) 1000 UNITS tablet Take 2,000 Units by mouth daily. Reported on 07/20/2015    Historical Provider, MD  clonazePAM (KLONOPIN) 1 MG tablet Take 1 tablet (1 mg total) by mouth daily as needed for anxiety. 08/24/15 08/23/16  Oletta Darter, MD  dicyclomine (BENTYL) 10 MG capsule Take 10 mg by mouth 4 (four) times daily -  before meals and at bedtime.    Historical Provider, MD  ibuprofen (ADVIL,MOTRIN) 200 MG tablet Take 800 mg by mouth every 6 (six) hours as needed for moderate pain. Reported on 07/20/2015    Historical Provider, MD  lamoTRIgine (LAMICTAL) 150 MG tablet Take 2 tablets (300 mg total) by mouth daily. 08/24/15   Oletta Darter, MD  lithium carbonate (LITHOBID) 300 MG CR tablet Take 1 tablet (300 mg total) by mouth daily. 08/24/15 08/23/16  Oletta Darter, MD  lubiprostone (AMITIZA) 8 MCG capsule Take 8 mcg by mouth daily with breakfast.    Historical Provider, MD  meloxicam (MOBIC) 15 MG tablet Take 1 tablet (15 mg total) by mouth  daily. Patient not taking: Reported on 07/20/2015 02/21/15   Elvina Sidle, MD  Multiple Vitamins-Minerals (MULTIVITAL PO) Take 1 tablet by mouth 2 (two) times daily.    Historical Provider, MD  OLANZapine (ZYPREXA) 7.5 MG tablet Take 1 tablet (7.5 mg total) by mouth at bedtime. 08/24/15   Oletta Darter, MD  ondansetron (ZOFRAN) 8 MG tablet Take 8 mg by mouth 3 (three) times daily as needed for nausea or vomiting.     Historical Provider, MD  pantoprazole (PROTONIX) 40 MG tablet Take 40 mg by mouth 2 (two) times daily.  12/09/14   Historical Provider, MD  promethazine (PHENERGAN) 25 MG tablet Take 1 tablet (25 mg total) by mouth every 8 (eight) hours as needed for nausea or vomiting. Patient not taking: Reported on 06/22/2015 04/25/15   Charlestine Night, PA-C  ranitidine (ZANTAC) 150 MG tablet Take 150 mg by mouth 2 (two) times daily. Reported on 07/20/2015    Historical Provider, MD  sertraline (ZOLOFT) 100 MG tablet Take 2 tablets (200 mg total) by mouth daily. 08/24/15 08/23/16  Oletta Darter, MD   BP 110/53 mmHg  Pulse 84  Temp(Src) 97.7 F (36.5 C) (Oral)  Resp 16  SpO2 98% Physical Exam  Constitutional: She is oriented to person, place, and time. She appears well-developed and well-nourished. No distress.  HENT:  Head: Normocephalic and atraumatic.  Eyes: EOM are normal.  Neck: Normal range of motion.  Cardiovascular: Normal rate, regular rhythm and normal heart sounds.   Pulmonary/Chest: Effort normal and breath sounds normal.  Abdominal: Soft. She exhibits no distension. There is no tenderness.  Musculoskeletal: Normal range of motion.  Neurological: She is alert and oriented to person, place, and time.  Skin: Skin is warm and dry.  Psychiatric: She is slowed and withdrawn.  Nursing note and vitals reviewed.   ED Course  Procedures  DIAGNOSTIC STUDIES: Oxygen Saturation is 98% on RA, normal by my interpretation.    COORDINATION OF CARE: 1:48 AM-Discussed treatment plan  which includes IV fluids with pt at bedside and pt agreed to plan.   Labs Review Labs Reviewed  CBC WITH DIFFERENTIAL/PLATELET - Abnormal; Notable for the following:    RBC 3.70 (*)    Hemoglobin 11.1 (*)    HCT 33.1 (*)    All other components within normal limits  COMPREHENSIVE METABOLIC PANEL - Abnormal; Notable for the following:    ALT 13 (*)    All other components within normal limits  URINE RAPID DRUG SCREEN, HOSP PERFORMED - Abnormal; Notable for the following:    Benzodiazepines POSITIVE (*)    Tetrahydrocannabinol POSITIVE (*)    All other components within normal limits  ETHANOL  URINALYSIS, ROUTINE W REFLEX MICROSCOPIC (NOT AT [facility])  PREGNANCY, URINE    Imaging Review No results found. I have  personally reviewed and evaluated these lab results as part of my medical decision-making.   EKG Interpretation   Date/Time:  Monday August 28 2015 01:07:28 EST Ventricular Rate:  84 PR Interval:  121 QRS Duration: 81 QT Interval:  361 QTC Calculation: 427 R Axis:   66 Text Interpretation:  Sinus rhythm No significant change was found  Confirmed by Julius Matus  MD, Jacqueline Spofford ([id number]) on 08/28/2015 3:15:18 AM      MDM   Final diagnoses:  None    6:07 AM Patient feels much better at this time after IV fluids.  No HI or SI.  Overall well-appearing.  Decreased oral intake over the past several days.  She was recently seen by her psychiatrist for worsening depression.  I think this is likely related to her worsening depression.  Patient denies ingestion of additional medications.  She is calm and cooperative at this time.  Message follow-up with her primary care physician as well as her psychiatrist.  A vas that she return to the ER for new or worsening symptoms.  I personally performed the services described in this documentation, which was scribed in my presence. The recorded information has been reviewed and is accurate.        Azalia Bilis, MD 08/28/15 [phone]

## 2015-08-28 NOTE — ED Notes (Signed)
Bed: GY69 Expected date:  Expected time:  Means of arrival:  Comments: EMS 39yo F

## 2015-08-28 NOTE — ED Notes (Signed)
Pt comes to Ed via EMS, c/o pt may have stop taking her lithium and taken two or three doses of Zyprexa as reported by the patients roommates to Ems. Ems reports she has a hx of depression and anxiety and is very fidgety/ and restless. ems reports an allergy to pencillin and her v/s were 110/80, pulse 96, CBG 225. Assessment started and trying to gather more information at this time.

## 2015-08-31 ENCOUNTER — Telehealth (HOSPITAL_COMMUNITY): Payer: Self-pay

## 2015-08-31 DIAGNOSIS — F331 Major depressive disorder, recurrent, moderate: Secondary | ICD-10-CM

## 2015-08-31 MED ORDER — LITHIUM CARBONATE ER 300 MG PO TBCR: 300.0000 mg | EXTENDED_RELEASE_TABLET | Freq: Every day | ORAL | Status: DC

## 2015-08-31 MED ORDER — METHYLPHENIDATE 10 MG/9HR TD PTCH: 10.0000 mg | MEDICATED_PATCH | Freq: Every day | TRANSDERMAL | Status: DC

## 2015-08-31 NOTE — Telephone Encounter (Signed)
Telephone call with patient to follow up on message she was having problems with medication and needed to talk.  Patient reported she had to go to the ED on 08/28/15 after taking her Lithium and took two of her Zyprexa 7.5 mg pills prior.  Patient reported she was "totally out of it" as explained she could not remember anything from the dosages.  Patient reported her roommate had thrown out her Lithium as and she was no longer taking it and was now just taking Zyprexa as she was provided one at bedtime.  Patient reported her and her roommate were afraid for her to take Lithium again so she wanted to discuss this with Dr. Michae Kava.  Patient also reported she wanted to discuss getting an order for Daytrana to try to get this covered on her insurance and agreed to send message to Dr.Agarwal with request she call her back to discuss.

## 2015-08-31 NOTE — Telephone Encounter (Signed)
Pt states she took too much medicine the other day. She took 2 Zyprexa tabs and one Lithium when feeling angry. It calmed her down but she was "out of it". Her roommate took her to the [facility] and pt was released home. Since then she has not taken any more Lithium but continues to take one tab of Zyprexa every night. Roommate threw away her Lithium. Depression is bad but anxiety is worse. Pt is taking Klonopin and even though it helps she doesn't like taking them.  Pt did get a call from [facility] but she didn't respond.  A/P: MDD, GAD 1. Restart Lithium CR 300mg   2. Pt is waiting on return phone call from [facility] 3. Encouraged to call [facility] 4. Pt wants to try to get PA for Daytrana patch

## 2015-09-01 ENCOUNTER — Emergency Department (HOSPITAL_COMMUNITY)
Admission: EM | Admit: 2015-09-01 | Discharge: 2015-09-01 | Disposition: A | Discharge status: Home / Self Care | Payer: Medicare Other | Attending: Emergency Medicine | Admitting: Emergency Medicine

## 2015-09-01 ENCOUNTER — Encounter (HOSPITAL_COMMUNITY): Payer: Self-pay | Admitting: Family Medicine

## 2015-09-01 DIAGNOSIS — Z8619 Personal history of other infectious and parasitic diseases: Secondary | ICD-10-CM | POA: Insufficient documentation

## 2015-09-01 DIAGNOSIS — E860 Dehydration: Secondary | ICD-10-CM | POA: Insufficient documentation

## 2015-09-01 DIAGNOSIS — Z87891 Personal history of nicotine dependence: Secondary | ICD-10-CM | POA: Diagnosis not present

## 2015-09-01 DIAGNOSIS — M545 Low back pain, unspecified: Secondary | ICD-10-CM

## 2015-09-01 DIAGNOSIS — Z8742 Personal history of other diseases of the female genital tract: Secondary | ICD-10-CM | POA: Diagnosis not present

## 2015-09-01 DIAGNOSIS — R630 Anorexia: Secondary | ICD-10-CM | POA: Diagnosis not present

## 2015-09-01 DIAGNOSIS — R300 Dysuria: Secondary | ICD-10-CM | POA: Diagnosis not present

## 2015-09-01 DIAGNOSIS — R109 Unspecified abdominal pain: Secondary | ICD-10-CM | POA: Insufficient documentation

## 2015-09-01 DIAGNOSIS — Z8679 Personal history of other diseases of the circulatory system: Secondary | ICD-10-CM | POA: Insufficient documentation

## 2015-09-01 DIAGNOSIS — Z79899 Other long term (current) drug therapy: Secondary | ICD-10-CM | POA: Diagnosis not present

## 2015-09-01 DIAGNOSIS — Z3202 Encounter for pregnancy test, result negative: Secondary | ICD-10-CM | POA: Diagnosis not present

## 2015-09-01 DIAGNOSIS — R6883 Chills (without fever): Secondary | ICD-10-CM | POA: Insufficient documentation

## 2015-09-01 DIAGNOSIS — Z880 Allergy status to penicillin: Secondary | ICD-10-CM | POA: Insufficient documentation

## 2015-09-01 DIAGNOSIS — K219 Gastro-esophageal reflux disease without esophagitis: Secondary | ICD-10-CM | POA: Diagnosis not present

## 2015-09-01 DIAGNOSIS — Z8659 Personal history of other mental and behavioral disorders: Secondary | ICD-10-CM | POA: Insufficient documentation

## 2015-09-01 LAB — COMPREHENSIVE METABOLIC PANEL
ALT: 16 U/L (ref 14–54)
AST: 25 U/L (ref 15–41)
Albumin: 4.1 g/dL (ref 3.5–5.0)
Alkaline Phosphatase: 71 U/L (ref 38–126)
Anion gap: 10 (ref 5–15)
BUN: 9 mg/dL (ref 6–20)
CO2: 25 mmol/L (ref 22–32)
Calcium: 9.4 mg/dL (ref 8.9–10.3)
Chloride: 104 mmol/L (ref 101–111)
Creatinine, Ser: 0.73 mg/dL (ref 0.44–1.00)
GFR calc Af Amer: >60 mL/min (ref >60)
GFR calc non Af Amer: >60 mL/min (ref >60)
Glucose, Bld: 63 mg/dL — ABNORMAL LOW (ref 65–99)
Potassium: 3.5 mmol/L (ref 3.5–5.1)
Sodium: 139 mmol/L (ref 135–145)
Total Bilirubin: 0.4 mg/dL (ref 0.3–1.2)
Total Protein: 6.9 g/dL (ref 6.5–8.1)

## 2015-09-01 LAB — CBC
HCT: 37.6 % (ref 36.0–46.0)
Hemoglobin: 12.5 g/dL (ref 12.0–15.0)
MCH: 29.5 pg (ref 26.0–34.0)
MCHC: 33.2 g/dL (ref 30.0–36.0)
MCV: 88.7 fL (ref 78.0–100.0)
Platelets: 269 10*3/uL (ref 150–400)
RBC: 4.24 MIL/uL (ref 3.87–5.11)
RDW: 12.6 % (ref 11.5–15.5)
WBC: 11 10*3/uL — ABNORMAL HIGH (ref 4.0–10.5)

## 2015-09-01 LAB — URINALYSIS, ROUTINE W REFLEX MICROSCOPIC
Bilirubin Urine: NEGATIVE
Glucose, UA: NEGATIVE mg/dL
Hgb urine dipstick: NEGATIVE
Ketones, ur: NEGATIVE mg/dL
Leukocytes, UA: NEGATIVE
Nitrite: NEGATIVE
Protein, ur: NEGATIVE mg/dL
Specific Gravity, Urine: 1.01 (ref 1.005–1.030)
pH: 7 (ref 5.0–8.0)

## 2015-09-01 LAB — LIPASE, BLOOD: Lipase: 29 U/L (ref 11–51)

## 2015-09-01 LAB — CBG MONITORING, ED
Glucose-Capillary: 79 mg/dL (ref 65–99)
Glucose-Capillary: 73 mg/dL (ref 65–99)

## 2015-09-01 LAB — POC URINE PREG, ED: Preg Test, Ur: NEGATIVE

## 2015-09-01 MED ORDER — KETOROLAC TROMETHAMINE 30 MG/ML IJ SOLN
30.0000 mg | Freq: Once | INTRAMUSCULAR | Status: AC
  Administered 2015-09-01: 30 mg via INTRAVENOUS
  Filled 2015-09-01: qty 1

## 2015-09-01 MED ORDER — CYCLOBENZAPRINE HCL 5 MG PO TABS: 5.0000 mg | ORAL_TABLET | Freq: Two times a day (BID) | ORAL | Status: DC | PRN

## 2015-09-01 MED ORDER — PROMETHAZINE HCL 25 MG/ML IJ SOLN
25.0000 mg | Freq: Once | INTRAMUSCULAR | Status: AC
  Administered 2015-09-01: 25 mg via INTRAVENOUS
  Filled 2015-09-01: qty 1

## 2015-09-01 MED ORDER — FENTANYL CITRATE (PF) 100 MCG/2ML IJ SOLN
25.0000 ug | Freq: Once | INTRAMUSCULAR | Status: AC
  Administered 2015-09-01: 25 ug via INTRAVENOUS
  Filled 2015-09-01: qty 2

## 2015-09-01 MED ORDER — PROMETHAZINE HCL 25 MG PO TABS: 25.0000 mg | ORAL_TABLET | Freq: Three times a day (TID) | ORAL | Status: DC | PRN

## 2015-09-01 MED ORDER — SODIUM CHLORIDE 0.9 % IV BOLUS (SEPSIS)
1000.0000 mL | Freq: Once | INTRAVENOUS | Status: AC
  Administered 2015-09-01: 1000 mL via INTRAVENOUS

## 2015-09-01 MED ORDER — CYCLOBENZAPRINE HCL 10 MG PO TABS
5.0000 mg | ORAL_TABLET | Freq: Once | ORAL | Status: AC
  Administered 2015-09-01: 5 mg via ORAL
  Filled 2015-09-01: qty 1

## 2015-09-01 NOTE — ED Notes (Signed)
Pt ambulates independently and with steady gait at time of discharge. Discharge instructions and follow up information reviewed with patient. No other questions or concerns voiced at this time. RX x 2 Given.

## 2015-09-01 NOTE — Discharge Instructions (Signed)
Take tylenol, motrin for pain.   Stay hydrated   Take phenergan as needed for nausea.   Take flexeril for muscle spasms.   See your doctor.   Return to ER if you have worse vomiting, dehydration, chest pain, back pain, unable to urinate

## 2015-09-01 NOTE — ED Provider Notes (Signed)
CSN: [id number]     Arrival date & time 09/01/15  1617 History   First MD Initiated Contact with Patient 09/01/15 1928     Chief Complaint  Patient presents with  . Back Pain     (Consider location/radiation/quality/duration/timing/severity/associated sxs/prior Treatment) The history is provided by the patient.  Karen Hahn is a 40 y.o. female history of reflux, anorexia, chronic bladder issues here presenting with lower back pain, burning with urination, subjective chills. Patient states that for the last several days, patient has been having bilateral flank pain as well as some burning with urination. Denies any vaginal discharge. Does have some subjective chills but no fever. Denies any vomiting or cough or diarrhea. Patient states that she has poor appetite despite taking Zofran. States that she has a history of Anorexia and gets dehydrated a lot.       Past Medical History  Diagnosis Date  . SMAS (superior mesenteric artery syndrome) (HCC)   . History of borderline personality disorder   . GERD (gastroesophageal reflux disease)   . Hypotension   . Osteopenia   . Gastric ulcer due to Helicobacter pylori   . Anorexia nervosa without bulimia   . Bladder problem    Past Surgical History  Procedure Laterality Date  . Laparoscopic abdominal exploration  2003  . Gastric bypass  07/2002  . Dilation and curettage of uterus  2005   Family History  Problem Relation Age of Onset  . Alcohol abuse Mother   . Anxiety disorder Mother   . Depression Mother     severe with hx of suicide attempt. treated with ECT  . COPD Mother   . Anxiety disorder Father   . Drug abuse Father   . COPD Father   . Alcohol abuse Maternal Uncle   . Anxiety disorder Maternal Grandmother   . Depression Maternal Grandmother   . ADD / ADHD Brother    Social History  Substance Use Topics  . Smoking status: Former Smoker -- 0.25 packs/day for .5 years    Types: Cigarettes  . Smokeless tobacco: Never  Used  . Alcohol Use: No   OB History    No data available     Review of Systems  Constitutional: Positive for chills.  Musculoskeletal: Positive for back pain.  All other systems reviewed and are negative.     Allergies  Bactrim; Penicillins; Ciprofloxacin; Morphine and related; and Diflucan  Home Medications   Prior to Admission medications   Medication Sig Start Date End Date Taking? Authorizing Provider  albuterol (PROAIR HFA) 108 (90 BASE) MCG/ACT inhaler Inhale 2 puffs into the lungs every 6 (six) hours as needed for wheezing or shortness of breath. 11/15/14  Yes Leslye Peer, MD  busPIRone (BUSPAR) 10 MG tablet Take 10 mg by mouth 2 (two) times daily.   Yes Historical Provider, MD  Calcium-Vitamin D-Vitamin K 500-100-40 MG-UNT-MCG CHEW Chew 1 tablet by mouth at bedtime. Reported on 07/20/2015   Yes Historical Provider, MD  cholecalciferol (VITAMIN D) 1000 UNITS tablet Take 2,000 Units by mouth daily. Reported on 07/20/2015   Yes Historical Provider, MD  clonazePAM (KLONOPIN) 1 MG tablet Take 1 tablet (1 mg total) by mouth daily as needed for anxiety. 08/24/15 08/23/16 Yes Oletta Darter, MD  dicyclomine (BENTYL) 10 MG capsule Take 10 mg by mouth 4 (four) times daily -  before meals and at bedtime.   Yes Historical Provider, MD  ibuprofen (ADVIL,MOTRIN) 200 MG tablet Take 800 mg by mouth every 6 (  six) hours as needed for moderate pain. Reported on 07/20/2015   Yes Historical Provider, MD  lamoTRIgine (LAMICTAL) 150 MG tablet Take 2 tablets (300 mg total) by mouth daily. 08/24/15  Yes Oletta Darter, MD  lubiprostone (AMITIZA) 8 MCG capsule Take 8 mcg by mouth daily with breakfast.   Yes Historical Provider, MD  Multiple Vitamins-Minerals (MULTIVITAL PO) Take 1 tablet by mouth 2 (two) times daily.   Yes Historical Provider, MD  OLANZapine (ZYPREXA) 7.5 MG tablet Take 1 tablet (7.5 mg total) by mouth at bedtime. 08/24/15  Yes Oletta Darter, MD  ondansetron (ZOFRAN) 8 MG tablet Take  8 mg by mouth 3 (three) times daily as needed for nausea or vomiting.    Yes Historical Provider, MD  pantoprazole (PROTONIX) 40 MG tablet Take 40 mg by mouth 2 (two) times daily.  12/09/14  Yes Historical Provider, MD  ranitidine (ZANTAC) 150 MG tablet Take 150 mg by mouth 2 (two) times daily. Reported on 07/20/2015   Yes Historical Provider, MD  sertraline (ZOLOFT) 100 MG tablet Take 2 tablets (200 mg total) by mouth daily. 08/24/15 08/23/16 Yes Oletta Darter, MD  busPIRone (BUSPAR) 15 MG tablet Take 1 tablet (15 mg total) by mouth 2 (two) times daily. Patient not taking: Reported on 09/01/2015 08/24/15   Oletta Darter, MD  lithium carbonate (LITHOBID) 300 MG CR tablet Take 1 tablet (300 mg total) by mouth daily. Patient not taking: Reported on 09/01/2015 08/31/15 08/30/16  Oletta Darter, MD  meloxicam (MOBIC) 15 MG tablet Take 1 tablet (15 mg total) by mouth daily. Patient not taking: Reported on 07/20/2015 02/21/15   Elvina Sidle, MD  methylphenidate [facility]) 10 mg/9hr patch Place 1 patch (10 mg total) onto the skin daily. wear patch for 9 hours only each day Patient not taking: Reported on 09/01/2015 08/31/15   Oletta Darter, MD  promethazine (PHENERGAN) 25 MG tablet Take 1 tablet (25 mg total) by mouth every 8 (eight) hours as needed for nausea or vomiting. Patient not taking: Reported on 06/22/2015 04/25/15   Charlestine Night, PA-C   BP 106/71 mmHg  Pulse 90  Temp(Src) 98.5 F (36.9 C)  Resp 14  SpO2 99% Physical Exam  Constitutional: She is oriented to person, place, and time. She appears well-developed and well-nourished.  HENT:  Head: Normocephalic.  MM slightly dry   Eyes: Conjunctivae are normal. Pupils are equal, round, and reactive to light.  Neck: Normal range of motion. Neck supple.  Cardiovascular: Normal rate, regular rhythm and normal heart sounds.   Pulmonary/Chest: Effort normal and breath sounds normal. No respiratory distress. She has no wheezes. She has no rales.   Abdominal: Soft. Bowel sounds are normal. She exhibits no distension. There is no tenderness. There is no rebound.  Musculoskeletal:  Mild bilateral paralumbar tenderness vs CVAT, no midline tenderness   Neurological: She is alert and oriented to person, place, and time.  Skin: Skin is warm and dry.  Psychiatric: She has a normal mood and affect. Her behavior is normal. Judgment and thought content normal.  Nursing note and vitals reviewed.   ED Course  Procedures (including critical care time) Labs Review Labs Reviewed  CBC - Abnormal; Notable for the following:    WBC 11.0 (*)    All other components within normal limits  COMPREHENSIVE METABOLIC PANEL - Abnormal; Notable for the following:    Glucose, Bld 63 (*)    All other components within normal limits  URINALYSIS, ROUTINE W REFLEX MICROSCOPIC (NOT AT [facility])  LIPASE, BLOOD  POC URINE PREG, ED  CBG MONITORING, ED    Imaging Review No results found. I have personally reviewed and evaluated these images and lab results as part of my medical decision-making.   EKG Interpretation None      MDM   Final diagnoses:  None    Karen Hahn is a 40 y.o. female here with dehydration, flank pain, dysuria. Consider pyelo vs MSK pain vs electrolyte abnormalities from anorexia. Will get labs, UA. Will hydrate and reassess.    9:32 PM UA nl and showed no infection or blood. Glucose 63 on chemistry but CBG is 73. Given 1 L NS and flexeril and pain meds and felt better. No longer tachycardic. Will dc home with phenergan, prn flexeril.    Richardean Canal, MD 09/01/15 2133

## 2015-09-01 NOTE — ED Notes (Signed)
Pt here for lower back pain, urgency, burning, fever and chills. sts hx of bladder issues.

## 2015-09-05 ENCOUNTER — Ambulatory Visit: Payer: Self-pay | Admitting: Emergency Medicine

## 2015-09-21 ENCOUNTER — Ambulatory Visit (HOSPITAL_COMMUNITY): Payer: Self-pay | Admitting: Psychiatry

## 2015-10-03 ENCOUNTER — Telehealth (HOSPITAL_COMMUNITY): Payer: Self-pay | Admitting: Psychiatry

## 2015-10-03 ENCOUNTER — Encounter (HOSPITAL_COMMUNITY): Payer: Self-pay | Admitting: Psychiatry

## 2015-10-03 ENCOUNTER — Ambulatory Visit (INDEPENDENT_AMBULATORY_CARE_PROVIDER_SITE_OTHER): Payer: 59 | Admitting: Psychiatry

## 2015-10-03 VITALS — BP 128/74 | HR 103 | Ht 63.0 in | Wt 99.2 lb

## 2015-10-03 DIAGNOSIS — F900 Attention-deficit hyperactivity disorder, predominantly inattentive type: Secondary | ICD-10-CM

## 2015-10-03 DIAGNOSIS — F5000 Anorexia nervosa, unspecified: Secondary | ICD-10-CM | POA: Diagnosis not present

## 2015-10-03 DIAGNOSIS — F988 Other specified behavioral and emotional disorders with onset usually occurring in childhood and adolescence: Secondary | ICD-10-CM

## 2015-10-03 DIAGNOSIS — F411 Generalized anxiety disorder: Secondary | ICD-10-CM

## 2015-10-03 DIAGNOSIS — F639 Impulse disorder, unspecified: Secondary | ICD-10-CM | POA: Diagnosis not present

## 2015-10-03 DIAGNOSIS — F331 Major depressive disorder, recurrent, moderate: Secondary | ICD-10-CM

## 2015-10-03 MED ORDER — OLANZAPINE 7.5 MG PO TABS: 7.5000 mg | ORAL_TABLET | Freq: Every day | ORAL | Status: DC

## 2015-10-03 MED ORDER — SERTRALINE HCL 100 MG PO TABS: 200.0000 mg | ORAL_TABLET | Freq: Every day | ORAL | Status: DC

## 2015-10-03 MED ORDER — LAMOTRIGINE 150 MG PO TABS: 300.0000 mg | ORAL_TABLET | Freq: Every day | ORAL | Status: DC

## 2015-10-03 MED ORDER — CLONAZEPAM 1 MG PO TABS: 1.0000 mg | ORAL_TABLET | Freq: Every day | ORAL | Status: DC | PRN

## 2015-10-03 MED ORDER — BUPROPION HCL ER (XL) 150 MG PO TB24: 150.0000 mg | ORAL_TABLET | Freq: Every day | ORAL | Status: DC

## 2015-10-03 NOTE — Progress Notes (Signed)
[facility] [id number] Progress Note  Karen Hahn [id number] 40 y.o.  10/03/2015 10:46 AM  Chief Complaint: "I am going to [facility] ASAP"  History of Present Illness: Pt was not able to fill the Daytrana because insurance would not cover it. Concentration remains poor. She is easily distracted and has poor memory.   Appetite is poor and she is eating 1 meals/day. Pt is 99 lbs today and wants to increase to 115lbs but is not gaining weight. Pt is restricting but only eating one decent meal at dinner. Pt is struggling to swallow food. She plans to go to [facility] as soon as they approve her.   The fighting with her roommate and best friend has decreased. Pt remains irritable but it is better.   Pt states she is depressed. Pt has been isolating herself and is endorsing anhedonia. Pt is overly sensitive. She is still not working and has no desire to do anything. Pt is not going out and spends her time doing nothing. Pt sits on the couch all day. Denies worthlessness and hopelessness.   Energy is poor. Sleeping poorly and getting about 3-4 hrs/night.   Anxiety is increased and the level is thru the roof. She is picking at her skin all day. It makes her feel bad about herself. States picking at her scabs gives her relief. Pt is taking Buspar and it is not helping.   States she is taking Lamictal and  Zyprexa and Zoloft and Buspar as prescribed and denies SE. Pt never started Lithium.   Suicidal Ideation: No Plan Formed: No Patient has means to carry out plan: No  Homicidal Ideation: No Plan Formed: No Patient has means to carry out plan: No  Review of Systems: Psychiatric: Agitation: Yes Hallucination: No Depressed Mood: Yes Insomnia: Yes Hypersomnia: No Altered Concentration: Yes Feels Worthless: No Grandiose Ideas: No Belief In Special Powers: No New/Increased Substance Abuse: No Compulsions: Yes  Neurologic: Headache: No Seizure: No Paresthesias: No    Review of Systems  Constitutional: Positive for chills and weight loss. Negative for fever.  HENT: Negative for congestion, ear pain, nosebleeds and sore throat.   Eyes: Negative for blurred vision, double vision and pain.  Respiratory: Negative for cough, sputum production and wheezing.   Cardiovascular: Negative for chest pain, palpitations and leg swelling.  Gastrointestinal: Positive for heartburn. Negative for nausea, vomiting, abdominal pain and diarrhea.  Musculoskeletal: Negative for back pain, joint pain and neck pain.  Skin: Negative for itching and rash.  Neurological: Negative for dizziness, tremors, sensory change, seizures, loss of consciousness and headaches.  Psychiatric/Behavioral: Positive for depression. Negative for suicidal ideas, hallucinations and substance abuse. The patient is nervous/anxious and has insomnia.      Past Medical, Family, Social History: Endorsing family hx of depression, ADD, alcohol abuse and anxiety disorder. Pt is living with her friend in [place], [place]. She is divorced and was married for 5 yrs. Pt has a GED. Currently unemployed and on disability. Pt smokes a few cigs a week. She smokes THC thru the day. Denies alcohol use.   Past Medical History  Diagnosis Date  . SMAS (superior mesenteric artery syndrome) (HCC)   . History of borderline personality disorder   . GERD (gastroesophageal reflux disease)   . Hypotension   . Osteopenia   . Gastric ulcer due to Helicobacter pylori   . Anorexia nervosa without bulimia   . Bladder problem     Outpatient Encounter Prescriptions as of 10/03/2015  Medication Sig  . albuterol (PROAIR HFA) 108 (90 BASE) MCG/ACT inhaler Inhale 2 puffs into the lungs every 6 (six) hours as needed for wheezing or shortness of breath.  . busPIRone (BUSPAR) 10 MG tablet Take 10 mg by mouth 2 (two) times daily.  . busPIRone (BUSPAR) 15 MG tablet Take 1 tablet (15 mg total) by mouth 2 (two) times daily.  .  Calcium-Vitamin D-Vitamin K 500-100-40 MG-UNT-MCG CHEW Chew 1 tablet by mouth at bedtime. Reported on 07/20/2015  . cholecalciferol (VITAMIN D) 1000 UNITS tablet Take 2,000 Units by mouth daily. Reported on 07/20/2015  . clonazePAM (KLONOPIN) 1 MG tablet Take 1 tablet (1 mg total) by mouth daily as needed for anxiety.  . cyclobenzaprine (FLEXERIL) 5 MG tablet Take 1 tablet (5 mg total) by mouth 2 (two) times daily as needed for muscle spasms.  Marland Kitchen dicyclomine (BENTYL) 10 MG capsule Take 10 mg by mouth 4 (four) times daily -  before meals and at bedtime.  Marland Kitchen ibuprofen (ADVIL,MOTRIN) 200 MG tablet Take 800 mg by mouth every 6 (six) hours as needed for moderate pain. Reported on 07/20/2015  . lamoTRIgine (LAMICTAL) 150 MG tablet Take 2 tablets (300 mg total) by mouth daily.  Marland Kitchen lubiprostone (AMITIZA) 8 MCG capsule Take 8 mcg by mouth daily with breakfast.  . methylphenidate (DAYTRANA) 10 mg/9hr patch Place 1 patch (10 mg total) onto the skin daily. wear patch for 9 hours only each day  . Multiple Vitamins-Minerals (MULTIVITAL PO) Take 1 tablet by mouth 2 (two) times daily.  Marland Kitchen OLANZapine (ZYPREXA) 7.5 MG tablet Take 1 tablet (7.5 mg total) by mouth at bedtime.  . ondansetron (ZOFRAN) 8 MG tablet Take 8 mg by mouth 3 (three) times daily as needed for nausea or vomiting.   . pantoprazole (PROTONIX) 40 MG tablet Take 40 mg by mouth 2 (two) times daily.   . promethazine (PHENERGAN) 25 MG tablet Take 1 tablet (25 mg total) by mouth every 8 (eight) hours as needed for nausea or vomiting.  . ranitidine (ZANTAC) 150 MG tablet Take 150 mg by mouth 2 (two) times daily. Reported on 07/20/2015  . sertraline (ZOLOFT) 100 MG tablet Take 2 tablets (200 mg total) by mouth daily.  Marland Kitchen lithium carbonate (LITHOBID) 300 MG CR tablet Take 1 tablet (300 mg total) by mouth daily. (Patient not taking: Reported on 09/01/2015)  . meloxicam (MOBIC) 15 MG tablet Take 1 tablet (15 mg total) by mouth daily. (Patient not taking: Reported on  10/03/2015)   No facility-administered encounter medications on file as of 10/03/2015.    Past Psychiatric History/Hospitalization(s): Anxiety: Yes Bipolar Disorder: No Depression: Yes Mania: No Psychosis: No Schizophrenia: No Personality Disorder: No Hospitalization for psychiatric illness: Yes numerous- 2 intensive eating program treatments in 2013 and 2014. BH eating d/o with Dr. Orland Dec Nov 2011. Multiple inpt hospitalizations at [facility] (2011, 2011, 2005), [place] (3x), [facility], [place], [place] (06/2013).  History of Electroconvulsive Shock Therapy: No Prior Suicide Attempts: No  Previous meds: Risperdal, Zyprexa, Seroquel, Paxil, Prozac, Remeron, Cymbalta  Physical Exam: Constitutional:  BP 128/74 mmHg  Pulse 103  Ht 5\' 3"  (1.6 m)  Wt 99 lb 3.2 oz (44.997 kg)  BMI 17.58 kg/m2  Filed Vitals:   10/03/15 1037  Height: 5\' 3"  (1.6 m)  Weight: 99 lb 3.2 oz (44.997 kg)   Filed Vitals:   10/03/15 1037  BP: 128/74  Pulse: 103     General Appearance: alert, oriented, no acute distress  Musculoskeletal: Strength & Muscle  Tone: within normal limits Gait & Station: normal Patient leans: straight  Mental Status Examination/Evaluation: Objective: Attitude: Calm and cooperative  Appearance: Fairly Groomed and Neat, thin. visible scars and scabs all over face. Appears older than stated age.   Eye Contact::  Good  Speech:  Clear and Coherent and Normal Rate  Volume:  Normal  Mood:  Depressed and anxious  Affect:  Congruent  Thought Process:  Goal Directed  Orientation:  Full (Time, Place, and Person)  Thought Content:  Negative  Suicidal Thoughts:  No  Homicidal Thoughts:  No  Judgement:  Fair  Insight:  Fair  Concentration: good  Memory: Immediate-fair  Recent-fair Remote-fair  Recall: fair  Language: fair  Gait and Station: normal  [employer] of Knowledge: average  Psychomotor Activity:  Normal  Akathisia:  No  Handed:  Right  AIMS (if indicated):   Facial and Oral Movements  Muscles of Facial Expression: None, normal  Lips and Perioral Area: None, normal  Jaw: None, normal  Tongue: None, normal Extremity Movements: Upper (arms, wrists, hands, fingers): None, normal  Lower (legs, knees, ankles, toes): None, normal,  Trunk Movements:  Neck, shoulders, hips: None, normal,  Overall Severity : Severity of abnormal movements (highest score from questions above): None, normal  Incapacitation due to abnormal movements: None, normal  Patient's awareness of abnormal movements (rate only patient's report): No Awareness, Dental Status  Current problems with teeth and/or dentures?: No  Does patient usually wear dentures?: No    Assets:  Communication Skills Desire for Improvement Financial Resources/Insurance Housing Social Support Architect (Choose Three): Review of Psycho-Social Stressors (1), Review or order clinical lab tests (1), Established Problem, Worsening (2), Review of Last Therapy Session (1), Review of Medication Regimen & Side Effects (2) and Review of New Medication or Change in Dosage (2)  Assessment: AXIS I  Axis I: Impulse control disorder, Anorexia Nervosa, Cannabis dependence, MDD- moderate, recurrent,  GAD, ADD- inattentive type, Insomnia  AXIS II  Deferred       Treatment Plan/Recommendations:  Plan of Care:   Medication management with supportive therapy. Risks/benefits and SE of the medication discussed. Pt verbalized understanding and verbal consent obtained for treatment.  Affirm with the patient that the medications are taken as ordered. Patient expressed understanding of how their medications were to be used.  -worsening of symptoms   Laboratory: reviewed labs glu 142,, WBC elevated, urine preg neg Order EKG-reminded pt to get EKG ASAP  Psychotherapy:Therapy: brief supportive therapy provided. Discussed psychosocial stressors in detail.       Medications:   -Continue Lamictal 300mg  for mood stabilization -d/c Lithium start trial of Wellbutrin XL 150mg  po qD for depression -Zyprexa 7.5mg  po qHS for mood stabalization, adjunct for depression and anxiety.  -  Zoloft 200mg  po qD for mood and anxiety D/c Buspar  -given 30 tabs of Klonopin 1mg  po qD prn anxiety.  - avoid stimulants if possible due to appetite suppression - Daytrana patch as it was not approved by PCP  Routine PRN Medications: No  Consultations: given info for therapist and information for for eating disorders. [facility]  for treatment.    Safety Concerns: Pt denies SI and is at an acute low risk for suicide.Patient told to call clinic if any problems occur. Patient advised to go to ER  if she should develop SI/HI, side effects, or if symptoms worsen. Has crisis numbers to call  if needed. Pt verbalized understanding.   Other: F/up in 2 months or sooner if needed      Oletta Darter, MD 10/03/2015

## 2015-10-03 NOTE — Telephone Encounter (Signed)
Spoke with Karen Hahn in admissions at [facility]. States pt is currently undergoing the admission process and will be admitted once she is medically cleared. Reviewed pt's current symptoms with [name].

## 2015-10-12 ENCOUNTER — Encounter (HOSPITAL_BASED_OUTPATIENT_CLINIC_OR_DEPARTMENT_OTHER): Payer: Self-pay | Admitting: *Deleted

## 2015-10-12 NOTE — Progress Notes (Signed)
NPO AFTER MN.  ARRIVE AT 0700.  NEEDS HG AND URINE PREG.  WILL TAKE AM MEDS W/ SIPS OF WATER.

## 2015-10-18 ENCOUNTER — Encounter (HOSPITAL_BASED_OUTPATIENT_CLINIC_OR_DEPARTMENT_OTHER)
Admission: RE | Disposition: A | Discharge status: Home / Self Care | Payer: Self-pay | Source: Ambulatory Visit | Attending: Urology

## 2015-10-18 ENCOUNTER — Ambulatory Visit (HOSPITAL_COMMUNITY): Payer: Medicare Other

## 2015-10-18 ENCOUNTER — Ambulatory Visit (HOSPITAL_BASED_OUTPATIENT_CLINIC_OR_DEPARTMENT_OTHER): Payer: Medicare Other | Admitting: Anesthesiology

## 2015-10-18 ENCOUNTER — Ambulatory Visit (HOSPITAL_BASED_OUTPATIENT_CLINIC_OR_DEPARTMENT_OTHER)
Admission: RE | Admit: 2015-10-18 | Discharge: 2015-10-18 | Disposition: A | Discharge status: Home / Self Care | Payer: Medicare Other | Source: Ambulatory Visit | Attending: Urology | Admitting: Urology

## 2015-10-18 ENCOUNTER — Encounter (HOSPITAL_BASED_OUTPATIENT_CLINIC_OR_DEPARTMENT_OTHER): Payer: Self-pay | Admitting: *Deleted

## 2015-10-18 DIAGNOSIS — N3941 Urge incontinence: Secondary | ICD-10-CM | POA: Insufficient documentation

## 2015-10-18 DIAGNOSIS — D649 Anemia, unspecified: Secondary | ICD-10-CM | POA: Insufficient documentation

## 2015-10-18 DIAGNOSIS — F329 Major depressive disorder, single episode, unspecified: Secondary | ICD-10-CM | POA: Insufficient documentation

## 2015-10-18 DIAGNOSIS — K219 Gastro-esophageal reflux disease without esophagitis: Secondary | ICD-10-CM | POA: Diagnosis not present

## 2015-10-18 DIAGNOSIS — N312 Flaccid neuropathic bladder, not elsewhere classified: Secondary | ICD-10-CM | POA: Insufficient documentation

## 2015-10-18 DIAGNOSIS — Z79899 Other long term (current) drug therapy: Secondary | ICD-10-CM | POA: Diagnosis not present

## 2015-10-18 DIAGNOSIS — Z9884 Bariatric surgery status: Secondary | ICD-10-CM | POA: Diagnosis not present

## 2015-10-18 DIAGNOSIS — F411 Generalized anxiety disorder: Secondary | ICD-10-CM | POA: Diagnosis not present

## 2015-10-18 DIAGNOSIS — K5900 Constipation, unspecified: Secondary | ICD-10-CM | POA: Diagnosis not present

## 2015-10-18 DIAGNOSIS — R32 Unspecified urinary incontinence: Secondary | ICD-10-CM

## 2015-10-18 DIAGNOSIS — J449 Chronic obstructive pulmonary disease, unspecified: Secondary | ICD-10-CM | POA: Insufficient documentation

## 2015-10-18 HISTORY — PX: INTERSTIM IMPLANT PLACEMENT: SHX5130

## 2015-10-18 HISTORY — DX: Personal history of other infectious and parasitic diseases: Z86.19

## 2015-10-18 HISTORY — DX: Personal history of other diseases of the digestive system: Z87.19

## 2015-10-18 HISTORY — DX: Generalized anxiety disorder: F41.1

## 2015-10-18 HISTORY — DX: Impulse disorder, unspecified: F63.9

## 2015-10-18 HISTORY — DX: Urge incontinence: N39.41

## 2015-10-18 HISTORY — DX: Other constipation: K59.09

## 2015-10-18 HISTORY — DX: Other specified behavioral and emotional disorders with onset usually occurring in childhood and adolescence: F98.8

## 2015-10-18 HISTORY — DX: Major depressive disorder, single episode, moderate: F32.1

## 2015-10-18 HISTORY — DX: Personal history of peptic ulcer disease: Z87.11

## 2015-10-18 HISTORY — DX: Bariatric surgery status: Z98.84

## 2015-10-18 LAB — POCT PREGNANCY, URINE: Preg Test, Ur: NEGATIVE

## 2015-10-18 LAB — POCT HEMOGLOBIN-HEMACUE: Hemoglobin: 11.6 g/dL — ABNORMAL LOW (ref 12.0–15.0)

## 2015-10-18 SURGERY — INSERTION, SACRAL NERVE STIMULATOR, INTERSTIM, STAGE 1
Anesthesia: Monitor Anesthesia Care

## 2015-10-18 MED ORDER — CEFAZOLIN SODIUM-DEXTROSE 2-3 GM-% IV SOLR
INTRAVENOUS | Status: AC
  Filled 2015-10-18: qty 50

## 2015-10-18 MED ORDER — LACTATED RINGERS IV SOLN
INTRAVENOUS | Status: DC
  Administered 2015-10-18: 08:00:00 via INTRAVENOUS
  Filled 2015-10-18: qty 1000

## 2015-10-18 MED ORDER — FLUCONAZOLE 150 MG PO TABS: 150.0000 mg | ORAL_TABLET | Freq: Once | ORAL | Status: DC

## 2015-10-18 MED ORDER — HYDROMORPHONE HCL 1 MG/ML IJ SOLN
0.2500 mg | INTRAMUSCULAR | Status: DC | PRN
  Filled 2015-10-18: qty 1

## 2015-10-18 MED ORDER — LIDOCAINE-EPINEPHRINE (PF) 1 %-1:200000 IJ SOLN
INTRAMUSCULAR | Status: DC | PRN
  Administered 2015-10-18: 5.5 mL

## 2015-10-18 MED ORDER — CEFAZOLIN SODIUM-DEXTROSE 2-3 GM-% IV SOLR
2.0000 g | INTRAVENOUS | Status: AC
  Administered 2015-10-18: 2 g via INTRAVENOUS
  Filled 2015-10-18: qty 50

## 2015-10-18 MED ORDER — TRAMADOL HCL 50 MG PO TABS: 50.0000 mg | ORAL_TABLET | Freq: Four times a day (QID) | ORAL | Status: DC | PRN

## 2015-10-18 MED ORDER — ONDANSETRON HCL 4 MG/2ML IJ SOLN
INTRAMUSCULAR | Status: DC | PRN
Start: 2015-10-18 — End: 2015-10-18
  Administered 2015-10-18: 4 mg via INTRAVENOUS

## 2015-10-18 MED ORDER — FENTANYL CITRATE (PF) 100 MCG/2ML IJ SOLN
INTRAMUSCULAR | Status: DC | PRN
Start: 2015-10-18 — End: 2015-10-18
  Administered 2015-10-18: 25 ug via INTRAVENOUS
  Administered 2015-10-18: 50 ug via INTRAVENOUS
  Administered 2015-10-18: 25 ug via INTRAVENOUS

## 2015-10-18 MED ORDER — BUPIVACAINE-EPINEPHRINE 0.5% -1:200000 IJ SOLN
INTRAMUSCULAR | Status: DC | PRN
  Administered 2015-10-18: 5.5 mL

## 2015-10-18 MED ORDER — SODIUM CHLORIDE 0.9 % IV SOLN
250.0000 mg | INTRAVENOUS | Status: DC | PRN
  Administered 2015-10-18: 10 ug/kg/min via INTRAVENOUS

## 2015-10-18 MED ORDER — MIDAZOLAM HCL 5 MG/5ML IJ SOLN
INTRAMUSCULAR | Status: DC | PRN
  Administered 2015-10-18 (×2): 1 mg via INTRAVENOUS

## 2015-10-18 MED ORDER — CEPHALEXIN 500 MG PO CAPS: 500.0000 mg | ORAL_CAPSULE | Freq: Two times a day (BID) | ORAL | Status: DC

## 2015-10-18 MED ORDER — MEPERIDINE HCL 25 MG/ML IJ SOLN
6.2500 mg | INTRAMUSCULAR | Status: DC | PRN
  Filled 2015-10-18: qty 1

## 2015-10-18 MED ORDER — DEXAMETHASONE SODIUM PHOSPHATE 4 MG/ML IJ SOLN
INTRAMUSCULAR | Status: DC | PRN
  Administered 2015-10-18: 10 mg via INTRAVENOUS

## 2015-10-18 MED ORDER — LIDOCAINE HCL (CARDIAC) 20 MG/ML IV SOLN
INTRAVENOUS | Status: DC | PRN
  Administered 2015-10-18: 50 mg via INTRAVENOUS
  Administered 2015-10-18: 25 mg via INTRAVENOUS

## 2015-10-18 MED ORDER — TRAMADOL HCL 50 MG PO TABS
50.0000 mg | ORAL_TABLET | Freq: Once | ORAL | Status: AC
  Administered 2015-10-18: 50 mg via ORAL
  Filled 2015-10-18: qty 1

## 2015-10-18 MED ORDER — PROPOFOL 500 MG/50ML IV EMUL
INTRAVENOUS | Status: DC | PRN
  Administered 2015-10-18: 200 ug/kg/min via INTRAVENOUS

## 2015-10-18 MED ORDER — OXYCODONE HCL 5 MG/5ML PO SOLN
5.0000 mg | Freq: Once | ORAL | Status: DC | PRN
  Filled 2015-10-18: qty 5

## 2015-10-18 MED ORDER — CEFAZOLIN SODIUM 1-5 GM-% IV SOLN
1.0000 g | INTRAVENOUS | Status: DC
  Filled 2015-10-18: qty 50

## 2015-10-18 MED ORDER — OXYCODONE HCL 5 MG PO TABS
5.0000 mg | ORAL_TABLET | Freq: Once | ORAL | Status: DC | PRN
  Filled 2015-10-18: qty 1

## 2015-10-18 SURGICAL SUPPLY — 43 items
BANDAGE ADHESIVE 1X3 (GAUZE/BANDAGES/DRESSINGS) ×4 IMPLANT
BENZOIN TINCTURE PRP APPL 2/3 (GAUZE/BANDAGES/DRESSINGS) IMPLANT
BLADE HEX COATED 2.75 (ELECTRODE) ×2 IMPLANT
CABLE TEST STIMULATION (UROLOGICAL SUPPLIES) ×2 IMPLANT
COVER BACK TABLE 60X90IN (DRAPES) ×2 IMPLANT
COVER MAYO STAND STRL (DRAPES) ×2 IMPLANT
COVER PROBE W GEL 5X96 (DRAPES) ×2 IMPLANT
DRAPE INCISE 23X17 IOBAN STRL (DRAPES) ×1
DRAPE INCISE IOBAN 23X17 STRL (DRAPES) ×1 IMPLANT
DRAPE LAPAROSCOPIC ABDOMINAL (DRAPES) ×2 IMPLANT
DRAPE LG THREE QUARTER DISP (DRAPES) ×2 IMPLANT
DRSG TEGADERM 4X4.75 (GAUZE/BANDAGES/DRESSINGS) ×2 IMPLANT
DRSG TELFA 3X8 NADH (GAUZE/BANDAGES/DRESSINGS) ×2 IMPLANT
ELECT REM PT RETURN 9FT ADLT (ELECTROSURGICAL)
ELECTRODE REM PT RTRN 9FT ADLT (ELECTROSURGICAL) IMPLANT
GAUZE SPONGE 4X4 12PLY STRL LF (GAUZE/BANDAGES/DRESSINGS) ×4 IMPLANT
GLOVE BIO SURGEON STRL SZ7.5 (GLOVE) ×2 IMPLANT
GOWN STRL REUS W/ TWL LRG LVL3 (GOWN DISPOSABLE) ×1 IMPLANT
GOWN STRL REUS W/ TWL XL LVL3 (GOWN DISPOSABLE) ×1 IMPLANT
GOWN STRL REUS W/TWL LRG LVL3 (GOWN DISPOSABLE) ×1
GOWN STRL REUS W/TWL XL LVL3 (GOWN DISPOSABLE) ×1
INTRODUCER GUIDE DILATR SHEATH (SET/KITS/TRAYS/PACK) ×2 IMPLANT
KIT INTERSTIM LEAD TINED 28CM (Urological Implant) ×2 IMPLANT
KIT ROOM TURNOVER WOR (KITS) ×2 IMPLANT
LIQUID BAND (GAUZE/BANDAGES/DRESSINGS) ×2 IMPLANT
NEEDLE FORAMEN 20GA 3.5  9CM (NEEDLE) IMPLANT
NEEDLE FORAMEN 20GA 5  12.5CM (NEEDLE) IMPLANT
NEEDLE HYPO 22GX1.5 SAFETY (NEEDLE) IMPLANT
PACK BASIN DAY SURGERY FS (CUSTOM PROCEDURE TRAY) ×2 IMPLANT
PENCIL BUTTON HOLSTER BLD 10FT (ELECTRODE) IMPLANT
PROGRAMMER ANTENNA EXT (UROLOGICAL SUPPLIES) ×2 IMPLANT
PROGRAMMER STIMUL 2.2X1.1X3.7 (UROLOGICAL SUPPLIES) ×2 IMPLANT
STAPLER VISISTAT 35W (STAPLE) IMPLANT
STIMULATOR INTERSTIM 2X1.7X.3 (Orthopedic Implant) ×2 IMPLANT
STRIP CLOSURE SKIN 1/2X4 (GAUZE/BANDAGES/DRESSINGS) ×2 IMPLANT
SUT MNCRL AB 4-0 PS2 18 (SUTURE) ×4 IMPLANT
SUT VIC AB 3-0 SH 27 (SUTURE) ×1
SUT VIC AB 3-0 SH 27X BRD (SUTURE) ×1 IMPLANT
SUT VICRYL 4-0 PS2 18IN ABS (SUTURE) ×2 IMPLANT
SYR BULB IRRIGATION 50ML (SYRINGE) ×2 IMPLANT
SYR CONTROL 10ML LL (SYRINGE) IMPLANT
TOWEL OR 17X24 6PK STRL BLUE (TOWEL DISPOSABLE) ×4 IMPLANT
TUBE CONNECTING 12X1/4 (SUCTIONS) IMPLANT

## 2015-10-18 NOTE — Discharge Instructions (Signed)
Sacral Nerve Stimulator Implantation, Care After These instructions provide you with information on caring for yourself during the trial stimulator testing period. They also provide you with information on caring for yourself after the procedure to implant your permanent stimulator. Your caregiver may also give you more specific instructions. Call your caregiver if you have any problems or questions after your procedure. HOME CARE INSTRUCTIONS After the procedure, use the following directions when managing pain from your incision or caring for your incision sites:  Only take medicines as directed by your caregiver.  Keep the area of your incisions dry.  Take sponge baths to cleanse yourself.  Ask your caregiver when it is OK to take showers and baths.  Ask your caregiver when the bandages that cover your incisions can be removed. You will be given a remote control device. This device will allow you to turn your sacral nerve stimulator on and off. Make sure that your caregiver explains to you how to use this device.  SEEK MEDICAL CARE IF:  The length of the electrodes seems to change significantly. This could mean that the electrodes have moved out of place (migrated).  You still have pain 3 weeks after the implant procedure. SEEK IMMEDIATE MEDICAL CARE IF:   The area around your incision becomes red, swollen, and painful.  Your incisions bleed or leak fluid.  The pain around your incisions does not go away or gets worse. MAKE SURE YOU:  Understand these instructions.   This information is not intended to replace advice given to you by your health care provider. Make sure you discuss any questions you have with your health care provider.      Post Anesthesia Home Care Instructions  Activity: Get plenty of rest for the remainder of the day. A responsible adult should stay with you for 24 hours following the procedure.  For the next 24 hours, DO NOT: -Drive a car -Social worker -Drink alcoholic beverages -Take any medication unless instructed by your physician -Make any legal decisions or sign important papers.  Meals: Start with liquid foods such as gelatin or soup. Progress to regular foods as tolerated. Avoid greasy, spicy, heavy foods. If nausea and/or vomiting occur, drink only clear liquids until the nausea and/or vomiting subsides. Call your physician if vomiting continues.  Special Instructions/Symptoms: Your throat may feel dry or sore from the anesthesia or the breathing tube placed in your throat during surgery. If this causes discomfort, gargle with warm salt water. The discomfort should disappear within 24 hours.  If you had a scopolamine patch placed behind your ear for the management of post- operative nausea and/or vomiting:  1. The medication in the patch is effective for 72 hours, after which it should be removed.  Wrap patch in a tissue and discard in the trash. Wash hands thoroughly with soap and water. 2. You may remove the patch earlier than 72 hours if you experience unpleasant side effects which may include dry mouth, dizziness or visual disturbances. 3. Avoid touching the patch. Wash your hands with soap and water after contact with the patch.

## 2015-10-18 NOTE — Anesthesia Procedure Notes (Signed)
Procedure Name: MAC Date/Time: 10/18/2015 8:40 AM Performed by: Tyrone Nine Pre-anesthesia Checklist: Patient identified, Timeout performed, Emergency Drugs available, Suction available and Patient being monitored Patient Re-evaluated:Patient Re-evaluated prior to inductionOxygen Delivery Method: Nasal cannula Intubation Type: IV induction Placement Confirmation: positive ETCO2 Dental Injury: Teeth and Oropharynx as per pre-operative assessment

## 2015-10-18 NOTE — Transfer of Care (Signed)
Immediate Anesthesia Transfer of Care Note  Patient: Karen Hahn  Procedure(s) Performed: Procedure(s): INTERSTIM IMPLANT FIRST STAGE (N/A) INTERSTIM IMPLANT SECOND STAGE (N/A)  Patient Location: PACU  Anesthesia Type:MAC  Level of Consciousness: awake, alert , oriented and patient cooperative  Airway & Oxygen Therapy: Patient Spontanous Breathing and Patient connected to nasal cannula oxygen  Post-op Assessment: Report given to RN and Post -op Vital signs reviewed and stable  Post vital signs: Reviewed and stable   Last Vitals:  Filed Vitals:   10/18/15 0714 10/18/15 1013  BP: 110/65 132/83  Pulse: 93 128  Temp: 37.1 C 37.2 C  Resp: 16 14    Complications: No apparent anesthesia complications

## 2015-10-18 NOTE — Op Note (Addendum)
Preoperative diagnosis: Hypotonic bladder, urgency, urge incontinence  Postoperative diagnosis: Same  Procedure: Incision for implantation of neurostimulator lead and neurostimulator (full InterStim implant), Fluoroscopic guidance of implant, neurostimulator programming in PACU  Surgeon: Mena Goes  Anesthesia: Mac  Indication for procedure: 40 year old with history of large capacity bladder with symptoms of urgency and urge incontinence.  Urodynamics revealed a hypertonic large capacity bladder.  Patient successfully completed outpatient percutaneous neurostimulator trial.  She presents today for full InterStim implant.  Description of procedure: After consent was obtained the patient was brought to the operating room.  She was placed prone per our protocol and sedation was administered.  Pillows were placed under the lower abdomen to flatten the sacrum and under the shins to allow the toes to dangle freely.  A Ground pad was placed on the bottom of the patient's foot and the external test stimulator.  Patient was prepped and draped in the usual sterile fashion.  A timeout was performed to confirm the patient and procedure.  The C-arm was moved into the AP position to provide fluoroscopic guidance of the sacrum.  The medial edges of the foramen were identified and marked.  The C-arm was moved into the lateral position to identify the S3 foramen.  Once the needle point was determined local injection of 50-50 lidocaine and Marcaine was administered.  A 3.5 inch foramen needle was placed in the superior medial aspect of the left S3 foramen and appropriate needle depth was visualized utilizing fluoroscopy.  Proper S3 location was confirmed by direct observation of the bellowing of the perineum and plantar flexion of the great toe utilizing the J-hook patient cable in the external test stimulator.  The foramen stylette was removed and a directional guide was placed through the needle using the markers on  the guide to assure appropriate depth.  The foramen needle was removed by sliding over the directional guide.  A small incision was made in the skin.  The lead introducer with dilator was placed over the directional guide and utilizing fluoroscopic guidance the lead introducer was advanced into the radio opaque markers halfway through the foramen.  The dilator was removed along with the directional guide.  Using fluoroscopy The tined lead with the angled tip was placed through the introducer into the electrode to increased radically anterior surface of the sacrum. All 4 electrodes were tested observing excellent bellows and plantar flexion of the great toe at low energy.  After satisfactory lead positioning was confirmed introducer was retracted over the lead under continuous fluoroscopy deploying the tines into the presacral tissue.  Retesting of all 4 electrodes confirmed appropriate response.  The internal neurostimulator pocket site was identified below the iliac crest and lateral to the sacrum on the same/left side.  Local was administered and an incision was made into the subcutaneous tissue where blunt dissection was used to develop a pocket.  Hemostasis was excellent.  The tunneling sheath was placed from the lead exit site subcutaneously over to the incised pocket site.  The tunneling tool was removed and the lead was fed through the sheath exiting the pocket site.  The lead was cleaned and dried.  The lead was inserted into the neurostimulator head into the blue tip was visualized in the distal window.  The single setscrew was tightened using the torque wrench until an audible click was heard.  The neurostimulator was placed into the pocket with the etched identification placed upwards and any excessive lead placed underneath the neurostimulator.  The clinician programmer  telemetry had covered in a sterile sleeve was placed over the neurostimulator to ensure proper lead connection and the parameters were  in the normal range.  Impedances were confirmed to be within normal limits, greater than 50 and less than 4000 ohms.  The wounds were irrigated with antibiotic solution and sterile water.  The subcutaneous/Scarpa's fascia was run closed over the neurostimulator pocket site and one incision over the lead exit site.  A subcuticular Monocryl was then run to close both incisions.  Further the incisions were covered with Dermabond.  After the Dermabond dried a Telfa and OpSite were placed over the incisions.  The patient was awakened and taken to the recovery room in stable condition Where the neurostimulator was programmed.  Patient was provided utilization instructions for the patient programmer prior to discharge.  Estimated blood loss: 1 mL  Drains: None  Specimens: None

## 2015-10-18 NOTE — Anesthesia Postprocedure Evaluation (Signed)
Anesthesia Post Note  Patient: Karen Hahn  Procedure(s) Performed: Procedure(s) (LRB): INTERSTIM IMPLANT FIRST STAGE (N/A) INTERSTIM IMPLANT SECOND STAGE (N/A)  Patient location during evaluation: PACU Anesthesia Type: General Level of consciousness: awake and alert Pain management: pain level controlled Vital Signs Assessment: post-procedure vital signs reviewed and stable Respiratory status: spontaneous breathing, nonlabored ventilation and respiratory function stable Cardiovascular status: blood pressure returned to baseline and stable Postop Assessment: no signs of nausea or vomiting Anesthetic complications: no    Last Vitals:  Filed Vitals:   10/18/15 0714 10/18/15 1013  BP: 110/65 132/83  Pulse: 93 128  Temp: 37.1 C 37.2 C  Resp: 16 14    Last Pain: There were no vitals filed for this visit.               Kinan Safley A

## 2015-10-18 NOTE — H&P (Signed)
H&P  Chief Complaint: Urgency, urge incontinence  History of Present Illness: 40 year old with long history of large capacity bladder more recently with urgency and urge incontinence. Urodynamics revealed a hypotonic large capacity bladder with poor emptying. She did well with percutaneous InterStim trial and presents today for lead and neurostimulator implant. She's been well without fevers or dysuria.  Past Medical History  Diagnosis Date  . SMAS (superior mesenteric artery syndrome) (HCC)   . GERD (gastroesophageal reflux disease)   . Osteopenia   . Anorexia nervosa without bulimia   . COPD (chronic obstructive pulmonary disease) (HCC)   . Impulse control disorder   . GAD (generalized anxiety disorder)   . Moderate major depression (HCC)   . ADD (attention deficit disorder)   . History of gastric ulcer   . History of Helicobacter pylori infection     after 2003  . Urge urinary incontinence   . Chronic constipation   . History of gastric restrictive surgery    Past Surgical History  Procedure Laterality Date  . Dilation and curettage of uterus  2005  . Orif right wrist fx  2013    REMOVAL HARDWARE X2 in  2013--  No retained hardware  . Laparoscopic gastric restrictive duodenal procedure (duodenal switch)  12/ 2003    Home Medications:  Prescriptions prior to admission  Medication Sig Dispense Refill Last Dose  . albuterol (PROAIR HFA) 108 (90 BASE) MCG/ACT inhaler Inhale 2 puffs into the lungs every 6 (six) hours as needed for wheezing or shortness of breath. 1 Inhaler 3 10/18/2015 at 0600  . Cholecalciferol (VITAMIN D3) 2000 units TABS Take 1 tablet by mouth daily.   10/17/2015 at Unknown time  . clonazePAM (KLONOPIN) 1 MG tablet Take 1 tablet (1 mg total) by mouth daily as needed for anxiety. 30 tablet 0 10/16/2015  . dicyclomine (BENTYL) 10 MG capsule Take 10 mg by mouth 4 (four) times daily -  before meals and at bedtime.   10/18/2015 at 0500  . ibuprofen (ADVIL,MOTRIN) 200  MG tablet Take 800 mg by mouth every 6 (six) hours as needed for moderate pain. Reported on 07/20/2015   Past Month at Unknown time  . lamoTRIgine (LAMICTAL) 150 MG tablet Take 2 tablets (300 mg total) by mouth daily. (Patient taking differently: Take 300 mg by mouth every morning. ) 60 tablet 0 10/18/2015 at 0500  . lubiprostone (AMITIZA) 8 MCG capsule Take 8 mcg by mouth daily with breakfast.   10/17/2015 at Unknown time  . OLANZapine (ZYPREXA) 7.5 MG tablet Take 1 tablet (7.5 mg total) by mouth at bedtime. 30 tablet 0 10/17/2015 at Unknown time  . ondansetron (ZOFRAN) 8 MG tablet Take 8 mg by mouth 3 (three) times daily as needed for nausea or vomiting.    10/18/2015 at 0500  . pantoprazole (PROTONIX) 40 MG tablet Take 40 mg by mouth every morning.    Past Month at Unknown time  . ranitidine (ZANTAC) 150 MG tablet Take 150 mg by mouth every morning. Reported on 07/20/2015   10/18/2015 at 0500  . sertraline (ZOLOFT) 100 MG tablet Take 2 tablets (200 mg total) by mouth daily. (Patient taking differently: Take 200 mg by mouth every morning. ) 60 tablet 0 10/17/2015 at Unknown time   Allergies:  Allergies  Allergen Reactions  . Bactrim [Sulfamethoxazole-Trimethoprim] Other (See Comments)    Yeast infection  . Penicillins Other (See Comments)    Has patient had a PCN reaction causing immediate rash, facial/tongue/throat swelling, SOB or  lightheadedness with hypotension: NO (UTI, YEAST INFECTION) Has patient had a PCN reaction causing severe rash involving mucus membranes or skin necrosis: NO Has patient had a PCN reaction that required hospitalization NO Has patient had a PCN reaction occurring within the last 10 years: NO If all of the above answers are "NO", then may proceed with Cephalosporin use.   . Ciprofloxacin Itching  . Morphine And Related Itching and Other (See Comments)    "whole body feels tight feeling"  . Diflucan [Fluconazole] Rash    Family History  Problem Relation Age of Onset   . Alcohol abuse Mother   . Anxiety disorder Mother   . Depression Mother     severe with hx of suicide attempt. treated with ECT  . COPD Mother   . Anxiety disorder Father   . Drug abuse Father   . COPD Father   . Alcohol abuse Maternal Uncle   . Anxiety disorder Maternal Grandmother   . Depression Maternal Grandmother   . ADD / ADHD Brother    Social History:  reports that she has never smoked. She has never used smokeless tobacco. She reports that she does not drink alcohol or use illicit drugs.  ROS: A complete review of systems was performed.  All systems are negative except for pertinent findings as noted. ROS   Physical Exam:  Vital signs in last 24 hours: Temp:  [98.8 F (37.1 C)] 98.8 F (37.1 C) (03/15 0714) Pulse Rate:  [93] 93 (03/15 0714) Resp:  [16] 16 (03/15 0714) BP: (110)/(65) 110/65 mmHg (03/15 0714) SpO2:  [98 %] 98 % (03/15 0714) Weight:  [46.04 kg (101 lb 8 oz)] 46.04 kg (101 lb 8 oz) (03/15 0714) General:  Alert and oriented, No acute distress HEENT: Normocephalic, atraumatic Cardiovascular: Regular rate and rhythm Lungs: Regular rate and effort Abdomen: Soft, nontender, nondistended, no abdominal masses Extremities: No edema Neurologic: Grossly intact  Laboratory Data:  Results for orders placed or performed during the hospital encounter of 10/18/15 (from the past 24 hour(s))  Pregnancy, urine POC     Status: None   Collection Time: 10/18/15  7:45 AM  Result Value Ref Range   Preg Test, Ur NEGATIVE NEGATIVE  Hemoglobin-hemacue, POC     Status: Abnormal   Collection Time: 10/18/15  7:59 AM  Result Value Ref Range   Hemoglobin 11.6 (L) 12.0 - 15.0 g/dL   No results found for this or any previous visit (from the past 240 hour(s)). Creatinine: No results for input(s): CREATININE in the last 168 hours.  Impression/Assessment:  Hypotonic bladder, urgency, urge incontinence  Plan:  I discussed with the patient the nature, potential benefits,  risks and alternatives to Interstim lead and neurostimulator implant, including side effects of the proposed treatment, the likelihood of the patient achieving the goals of the procedure, and any potential problems that might occur during the procedure or recuperation. All questions answered. Patient elects to proceed.    Jameil Whitmoyer 10/18/2015, 8:22 AM

## 2015-10-18 NOTE — Anesthesia Preprocedure Evaluation (Addendum)
Anesthesia Evaluation  Patient identified by MRN, date of birth, ID band Patient awake    Reviewed: Allergy & Precautions, NPO status , Patient's Chart, lab work & pertinent test results  Airway Mallampati: II  TM Distance: >3 FB Neck ROM: Full    Dental  (+) Teeth Intact, Dental Advisory Given   Pulmonary COPD,  COPD inhaler,    breath sounds clear to auscultation       Cardiovascular Exercise Tolerance: Good negative cardio ROS   Rhythm:Regular Rate:Normal  28-Aug-2015 01:07:28 Sinus rhythm No significant change was found   Neuro/Psych PSYCHIATRIC DISORDERS Depression Hx Anorexia Nervosa Hx Impulse control disorder   GI/Hepatic Neg liver ROS, GERD  Medicated and Controlled,  Endo/Other  negative endocrine ROS  Renal/GU negative Renal ROS     Musculoskeletal negative musculoskeletal ROS (+)   Abdominal   Peds  Hematology  (+) anemia ,   Anesthesia Other Findings Hx of being aware during an abdominal procedure in 2004.  Did not discuss the episode with the surgical team/anesthesia then.  She was able to feel pain but was unable to move or signal her distress.  Seems very calm about the episode but "just wanted [place] to know".  I assured we would use a monitor of her brain to monitor her level of consciousness.  Reproductive/Obstetrics negative OB ROS                       Anesthesia Physical Anesthesia Plan  ASA: II  Anesthesia Plan: MAC   Post-op Pain Management:    Induction: Intravenous  Airway Management Planned: Nasal Cannula  Additional Equipment:   Intra-op Plan:   Post-operative Plan:   Informed Consent: I have reviewed the patients History and Physical, chart, labs and discussed the procedure including the risks, benefits and alternatives for the proposed anesthesia with the patient or authorized representative who has indicated his/her understanding and acceptance.   Dental  advisory given  Plan Discussed with: CRNA, Anesthesiologist and Surgeon  Anesthesia Plan Comments:        Anesthesia Quick Evaluation

## 2015-10-19 ENCOUNTER — Encounter (HOSPITAL_BASED_OUTPATIENT_CLINIC_OR_DEPARTMENT_OTHER): Payer: Self-pay | Admitting: Urology

## 2015-10-21 ENCOUNTER — Emergency Department (HOSPITAL_COMMUNITY)
Admission: EM | Admit: 2015-10-21 | Discharge: 2015-10-21 | Disposition: A | Discharge status: Home / Self Care | Payer: Medicare Other | Attending: Emergency Medicine | Admitting: Emergency Medicine

## 2015-10-21 ENCOUNTER — Encounter (HOSPITAL_COMMUNITY): Payer: Self-pay | Admitting: Emergency Medicine

## 2015-10-21 ENCOUNTER — Emergency Department (HOSPITAL_COMMUNITY): Payer: Medicare Other

## 2015-10-21 DIAGNOSIS — J449 Chronic obstructive pulmonary disease, unspecified: Secondary | ICD-10-CM | POA: Diagnosis not present

## 2015-10-21 DIAGNOSIS — E860 Dehydration: Secondary | ICD-10-CM

## 2015-10-21 DIAGNOSIS — Z3202 Encounter for pregnancy test, result negative: Secondary | ICD-10-CM | POA: Insufficient documentation

## 2015-10-21 DIAGNOSIS — Z792 Long term (current) use of antibiotics: Secondary | ICD-10-CM | POA: Diagnosis not present

## 2015-10-21 DIAGNOSIS — K219 Gastro-esophageal reflux disease without esophagitis: Secondary | ICD-10-CM | POA: Insufficient documentation

## 2015-10-21 DIAGNOSIS — F411 Generalized anxiety disorder: Secondary | ICD-10-CM | POA: Diagnosis not present

## 2015-10-21 DIAGNOSIS — F909 Attention-deficit hyperactivity disorder, unspecified type: Secondary | ICD-10-CM | POA: Diagnosis not present

## 2015-10-21 DIAGNOSIS — S060X0A Concussion without loss of consciousness, initial encounter: Secondary | ICD-10-CM | POA: Diagnosis not present

## 2015-10-21 DIAGNOSIS — Z79899 Other long term (current) drug therapy: Secondary | ICD-10-CM | POA: Diagnosis not present

## 2015-10-21 DIAGNOSIS — M8580 Other specified disorders of bone density and structure, unspecified site: Secondary | ICD-10-CM | POA: Insufficient documentation

## 2015-10-21 DIAGNOSIS — Z8619 Personal history of other infectious and parasitic diseases: Secondary | ICD-10-CM | POA: Diagnosis not present

## 2015-10-21 DIAGNOSIS — Y9389 Activity, other specified: Secondary | ICD-10-CM | POA: Diagnosis not present

## 2015-10-21 DIAGNOSIS — S199XXA Unspecified injury of neck, initial encounter: Secondary | ICD-10-CM | POA: Diagnosis not present

## 2015-10-21 DIAGNOSIS — S29002A Unspecified injury of muscle and tendon of back wall of thorax, initial encounter: Secondary | ICD-10-CM | POA: Diagnosis not present

## 2015-10-21 DIAGNOSIS — M542 Cervicalgia: Secondary | ICD-10-CM

## 2015-10-21 DIAGNOSIS — R630 Anorexia: Secondary | ICD-10-CM

## 2015-10-21 DIAGNOSIS — W19XXXA Unspecified fall, initial encounter: Secondary | ICD-10-CM

## 2015-10-21 DIAGNOSIS — K5900 Constipation, unspecified: Secondary | ICD-10-CM | POA: Diagnosis not present

## 2015-10-21 DIAGNOSIS — R112 Nausea with vomiting, unspecified: Secondary | ICD-10-CM

## 2015-10-21 DIAGNOSIS — Y998 Other external cause status: Secondary | ICD-10-CM | POA: Insufficient documentation

## 2015-10-21 DIAGNOSIS — E162 Hypoglycemia, unspecified: Secondary | ICD-10-CM

## 2015-10-21 DIAGNOSIS — M62830 Muscle spasm of back: Secondary | ICD-10-CM | POA: Diagnosis not present

## 2015-10-21 DIAGNOSIS — W01198A Fall on same level from slipping, tripping and stumbling with subsequent striking against other object, initial encounter: Secondary | ICD-10-CM | POA: Insufficient documentation

## 2015-10-21 DIAGNOSIS — S0990XA Unspecified injury of head, initial encounter: Secondary | ICD-10-CM | POA: Diagnosis present

## 2015-10-21 DIAGNOSIS — Y9289 Other specified places as the place of occurrence of the external cause: Secondary | ICD-10-CM | POA: Diagnosis not present

## 2015-10-21 DIAGNOSIS — G44319 Acute post-traumatic headache, not intractable: Secondary | ICD-10-CM | POA: Diagnosis not present

## 2015-10-21 DIAGNOSIS — Z880 Allergy status to penicillin: Secondary | ICD-10-CM | POA: Diagnosis not present

## 2015-10-21 DIAGNOSIS — R42 Dizziness and giddiness: Secondary | ICD-10-CM

## 2015-10-21 DIAGNOSIS — E11649 Type 2 diabetes mellitus with hypoglycemia without coma: Secondary | ICD-10-CM | POA: Diagnosis not present

## 2015-10-21 DIAGNOSIS — F329 Major depressive disorder, single episode, unspecified: Secondary | ICD-10-CM | POA: Diagnosis not present

## 2015-10-21 LAB — CBC
HCT: 37.6 % (ref 36.0–46.0)
Hemoglobin: 12.1 g/dL (ref 12.0–15.0)
MCH: 29.2 pg (ref 26.0–34.0)
MCHC: 32.2 g/dL (ref 30.0–36.0)
MCV: 90.6 fL (ref 78.0–100.0)
Platelets: 282 10*3/uL (ref 150–400)
RBC: 4.15 MIL/uL (ref 3.87–5.11)
RDW: 13.1 % (ref 11.5–15.5)
WBC: 15.8 10*3/uL — ABNORMAL HIGH (ref 4.0–10.5)

## 2015-10-21 LAB — BASIC METABOLIC PANEL
Anion gap: 18 — ABNORMAL HIGH (ref 5–15)
BUN: 17 mg/dL (ref 6–20)
CO2: 16 mmol/L — ABNORMAL LOW (ref 22–32)
Calcium: 9.3 mg/dL (ref 8.9–10.3)
Chloride: 104 mmol/L (ref 101–111)
Creatinine, Ser: 0.84 mg/dL (ref 0.44–1.00)
GFR calc Af Amer: >60 mL/min (ref >60)
GFR calc non Af Amer: >60 mL/min (ref >60)
Glucose, Bld: 56 mg/dL — ABNORMAL LOW (ref 65–99)
Potassium: 4.1 mmol/L (ref 3.5–5.1)
Sodium: 138 mmol/L (ref 135–145)

## 2015-10-21 LAB — URINALYSIS, ROUTINE W REFLEX MICROSCOPIC
Bilirubin Urine: NEGATIVE
Glucose, UA: NEGATIVE mg/dL
Hgb urine dipstick: NEGATIVE
Ketones, ur: >80 mg/dL — AB
Leukocytes, UA: NEGATIVE
Nitrite: NEGATIVE
Protein, ur: NEGATIVE mg/dL
Specific Gravity, Urine: 1.022 (ref 1.005–1.030)
pH: 5.5 (ref 5.0–8.0)

## 2015-10-21 LAB — I-STAT BETA HCG BLOOD, ED (MC, WL, AP ONLY): I-stat hCG, quantitative: <5 m[IU]/mL (ref <5)

## 2015-10-21 LAB — I-STAT TROPONIN, ED: Troponin i, poc: 0 ng/mL (ref 0.00–0.08)

## 2015-10-21 LAB — CBG MONITORING, ED: Glucose-Capillary: 64 mg/dL — ABNORMAL LOW (ref 65–99)

## 2015-10-21 MED ORDER — ONDANSETRON 8 MG PO TBDP: 8.0000 mg | ORAL_TABLET | Freq: Three times a day (TID) | ORAL | Status: DC | PRN

## 2015-10-21 MED ORDER — FENTANYL CITRATE (PF) 100 MCG/2ML IJ SOLN
50.0000 ug | Freq: Once | INTRAMUSCULAR | Status: AC
  Administered 2015-10-21: 50 ug via INTRAVENOUS
  Filled 2015-10-21: qty 2

## 2015-10-21 MED ORDER — NAPROXEN 500 MG PO TABS: 500.0000 mg | ORAL_TABLET | Freq: Two times a day (BID) | ORAL | Status: DC | PRN

## 2015-10-21 MED ORDER — ONDANSETRON HCL 4 MG/2ML IJ SOLN
4.0000 mg | Freq: Once | INTRAMUSCULAR | Status: AC
  Administered 2015-10-21: 4 mg via INTRAVENOUS
  Filled 2015-10-21: qty 2

## 2015-10-21 MED ORDER — SODIUM CHLORIDE 0.9 % IV BOLUS (SEPSIS)
1000.0000 mL | Freq: Once | INTRAVENOUS | Status: AC
  Administered 2015-10-21: 1000 mL via INTRAVENOUS

## 2015-10-21 NOTE — ED Provider Notes (Signed)
CSN: [id number]     Arrival date & time 10/21/15  1426 History   First MD Initiated Contact with Patient 10/21/15 1508     Chief Complaint  Patient presents with  . Migraine  . Nausea     (Consider location/radiation/quality/duration/timing/severity/associated sxs/prior Treatment) HPI Comments: Karen Hahn is a 40 y.o. female with a PMHx of GERD, osteopenia, anorexia, COPD, impulse control disorder, anxiety, depression, ADD, gastric ulcer, and urge urinary incontinence s/p InterStim implant on 10/18/15, who presents to the ED with complaints of fall yesterday around 3 PM. Patient states she had just stood up from the kitchen table, and had a witnessed fall falling backwards and hitting her head on the baseboard. She had no loss of consciousness, and denies any presyncopal lightheadedness/symptoms. Since then she has had a 10/10 generalized aching headache which is nonradiating with no known aggravating factors, and unrelieved with tramadol and ibuprofen. Associated symptoms include neck and upper back pain, nausea, and one episode of nonbloody nonbilious emesis this morning. She was given Zofran en route with some improvement. She also states that now she feels lightheaded when she stands up. She has been taking tramadol for her postsurgical pain, and she had taken it yesterday before the fall. She states that right before the fall during sensation she had was a sensation of feeling overall weak and like her legs were going to give out.  she denies any vision changes, vertigo, tinnitus, LOC, syncope, fevers, chills, chest pain, shortness of breath, abdominal pain, hematemesis, diarrhea, constipation, melena, hematochezia, dysuria, hematuria, numbness, tingling, or weakness. She denies use of any blood thinners. No hx of headaches. She's concerned about her head and neck being okay after the fall.   Patient is a 40 y.o. female presenting with fall. The history is provided by the patient and medical  records. No language interpreter was used.  Fall This is a new problem. The current episode started yesterday. The problem occurs rarely. The problem has been unchanged. Associated symptoms include myalgias (neck/back), nausea, neck pain and vomiting. Pertinent negatives include no abdominal pain, arthralgias, chest pain, chills, fever, numbness, urinary symptoms, vertigo or weakness. The symptoms are aggravated by standing. She has tried NSAIDs and oral narcotics for the symptoms. The treatment provided no relief.    Past Medical History  Diagnosis Date  . SMAS (superior mesenteric artery syndrome) (HCC)   . GERD (gastroesophageal reflux disease)   . Osteopenia   . Anorexia nervosa without bulimia   . COPD (chronic obstructive pulmonary disease) (HCC)   . Impulse control disorder   . GAD (generalized anxiety disorder)   . Moderate major depression (HCC)   . ADD (attention deficit disorder)   . History of gastric ulcer   . History of Helicobacter pylori infection     after 2003  . Urge urinary incontinence   . Chronic constipation   . History of gastric restrictive surgery    Past Surgical History  Procedure Laterality Date  . Dilation and curettage of uterus  2005  . Orif right wrist fx  2013    REMOVAL HARDWARE X2 in  2013--  No retained hardware  . Laparoscopic gastric restrictive duodenal procedure (duodenal switch)  12/ 2003  . Interstim implant placement N/A 10/18/2015    Procedure: [name] IMPLANT FIRST STAGE;  Surgeon: Jerilee Field, MD;  Location: [facility];  Service: Urology;  Laterality: N/A;  . Interstim implant placement N/A 10/18/2015    Procedure: INTERSTIM IMPLANT SECOND STAGE;  Surgeon: Molli Hazard  Mena Goes, MD;  Location: [facility];  Service: Urology;  Laterality: N/A;   Family History  Problem Relation Age of Onset  . Alcohol abuse Mother   . Anxiety disorder Mother   . Depression Mother     severe with hx of suicide attempt.  treated with ECT  . COPD Mother   . Anxiety disorder Father   . Drug abuse Father   . COPD Father   . Alcohol abuse Maternal Uncle   . Anxiety disorder Maternal Grandmother   . Depression Maternal Grandmother   . ADD / ADHD Brother    Social History  Substance Use Topics  . Smoking status: Never Smoker   . Smokeless tobacco: Never Used  . Alcohol Use: No   OB History    No data available     Review of Systems  Constitutional: Negative for fever and chills.  HENT: Negative for tinnitus.   Eyes: Negative for photophobia and visual disturbance.  Respiratory: Negative for shortness of breath.   Cardiovascular: Negative for chest pain.  Gastrointestinal: Positive for nausea and vomiting. Negative for abdominal pain, diarrhea, constipation and blood in stool.  Genitourinary: Negative for dysuria and hematuria.  Musculoskeletal: Positive for myalgias (neck/back) and neck pain. Negative for arthralgias.  Skin: Negative for color change and wound.  Allergic/Immunologic: Negative for immunocompromised state.  Neurological: Positive for light-headedness (with standing). Negative for dizziness, vertigo, syncope, weakness and numbness.  Hematological: Does not bruise/bleed easily.  Psychiatric/Behavioral: Negative for confusion.   10 Systems reviewed and are negative for acute change except as noted in the HPI.    Allergies  Bactrim; Penicillins; Ciprofloxacin; Morphine and related; and Diflucan  Home Medications   Prior to Admission medications   Medication Sig Start Date End Date Taking? Authorizing Provider  albuterol (PROAIR HFA) 108 (90 BASE) MCG/ACT inhaler Inhale 2 puffs into the lungs every 6 (six) hours as needed for wheezing or shortness of breath. 11/15/14  Yes Leslye Peer, MD  cephALEXin (KEFLEX) 500 MG capsule Take 1 capsule (500 mg total) by mouth 2 (two) times daily. 10/18/15  Yes Jerilee Field, MD  Cholecalciferol (VITAMIN D3) 2000 units TABS Take 1 tablet by  mouth daily.   Yes Historical Provider, MD  clonazePAM (KLONOPIN) 1 MG tablet Take 1 tablet (1 mg total) by mouth daily as needed for anxiety. 10/03/15 10/02/16 Yes Oletta Darter, MD  dicyclomine (BENTYL) 10 MG capsule Take 10 mg by mouth 4 (four) times daily -  before meals and at bedtime.   Yes Historical Provider, MD  fluconazole (DIFLUCAN) 150 MG tablet Take 1 tablet (150 mg total) by mouth once. May repeat in 4 days 10/18/15  Yes Jerilee Field, MD  ibuprofen (ADVIL,MOTRIN) 200 MG tablet Take 800 mg by mouth every 6 (six) hours as needed for moderate pain. Reported on 07/20/2015   Yes Historical Provider, MD  lamoTRIgine (LAMICTAL) 150 MG tablet Take 2 tablets (300 mg total) by mouth daily. Patient taking differently: Take 300 mg by mouth every morning.  10/03/15  Yes Oletta Darter, MD  lubiprostone (AMITIZA) 8 MCG capsule Take 8 mcg by mouth daily with breakfast.   Yes Historical Provider, MD  OLANZapine (ZYPREXA) 7.5 MG tablet Take 1 tablet (7.5 mg total) by mouth at bedtime. 10/03/15  Yes Oletta Darter, MD  ondansetron (ZOFRAN) 8 MG tablet Take 8 mg by mouth 3 (three) times daily as needed for nausea or vomiting.    Yes Historical Provider, MD  pantoprazole (PROTONIX) 40 MG  tablet Take 40 mg by mouth every morning.  12/09/14  Yes Historical Provider, MD  ranitidine (ZANTAC) 150 MG tablet Take 150 mg by mouth every morning. Reported on 07/20/2015   Yes Historical Provider, MD  sertraline (ZOLOFT) 100 MG tablet Take 2 tablets (200 mg total) by mouth daily. Patient taking differently: Take 200 mg by mouth every morning.  10/03/15 10/02/16 Yes Oletta Darter, MD  traMADol (ULTRAM) 50 MG tablet Take 1 tablet (50 mg total) by mouth every 6 (six) hours as needed for moderate pain. 10/18/15  Yes Jerilee Field, MD   BP 103/66 mmHg  Pulse 94  Temp(Src) 98.1 F (36.7 C) (Oral)  Resp 20  SpO2 98% Physical Exam  Constitutional: She is oriented to person, place, and time. Vital signs are normal. She  appears well-developed and well-nourished.  Non-toxic appearance. No distress.  Afebrile, nontoxic, NAD  HENT:  Head: Normocephalic and atraumatic. Head is without raccoon's eyes, without Battle's sign, without abrasion and without contusion.  Mouth/Throat: Oropharynx is clear and moist. Mucous membranes are dry.  [place]/AT, no scalp tenderness or crepitus, no bony step offs or deformities, no contusions or abrasions, no battle's sign or raccoon eyes Very dry mucous membranes  Eyes: Conjunctivae and EOM are normal. Pupils are equal, round, and reactive to light. Right eye exhibits no discharge. Left eye exhibits no discharge.  PERRL, EOMI, no nystagmus, no visual field deficits   Neck: Normal range of motion. Neck supple. Spinous process tenderness and muscular tenderness present. No rigidity. Normal range of motion present.  FROM intact with diffuse midline spinous process TTP, no bony stepoffs or deformities, and with diffuse b/l paraspinous muscle TTP and muscle spasms. No rigidity or meningeal signs. No bruising or swelling.   Cardiovascular: Normal rate, regular rhythm, normal heart sounds and intact distal pulses.  Exam reveals no gallop and no friction rub.   No murmur heard. HR in the 90s when seated but with standing up she goes into the 120s, reg rhythm, nl s1/s2, no m/r/g, distal pulses intact  Pulmonary/Chest: Effort normal and breath sounds normal. No respiratory distress. She has no decreased breath sounds. She has no wheezes. She has no rhonchi. She has no rales.  Abdominal: Soft. Normal appearance and bowel sounds are normal. She exhibits no distension. There is no tenderness. There is no rigidity, no rebound, no guarding, no CVA tenderness, no tenderness at McBurney's point and negative Murphy's sign.  Musculoskeletal: Normal range of motion.  MAE x4 Strength and sensation grossly intact Distal pulses intact Gait steady although she doesn't want to walk far due to feeling lightheaded  with standing up C-spine as above. All other spinal levels with FROM intact, no midline spinal TTP, with mild diffuse b/l paraspinous muscle TTP without spasms  Neurological: She is alert and oriented to person, place, and time. She has normal strength. No cranial nerve deficit or sensory deficit. Coordination and gait normal. GCS eye subscore is 4. GCS verbal subscore is 5. GCS motor subscore is 6.  CN 2-12 grossly intact A&O x4 GCS 15 Sensation and strength intact Gait nonataxic Coordination with finger-to-nose WNL Neg pronator drift   Skin: Skin is warm, dry and intact. No rash noted.  Psychiatric: She has a normal mood and affect.  Nursing note and vitals reviewed.   ED Course  Procedures (including critical care time)  15:35 Orthostatic Vital Signs AM  Orthostatic Lying  - BP- Lying: 101/60 mmHg ; Pulse- Lying: 98  Orthostatic Sitting - BP- Sitting:  105/63 mmHg ; Pulse- Sitting: 107  Orthostatic Standing at 0 minutes - BP- Standing at 0 minutes: 91/71 mmHg ; Pulse- Standing at 0 minutes: 133      Labs Review Labs Reviewed  CBC - Abnormal; Notable for the following:    WBC 15.8 (*)    All other components within normal limits  BASIC METABOLIC PANEL - Abnormal; Notable for the following:    CO2 16 (*)    Glucose, Bld 56 (*)    Anion gap 18 (*)    All other components within normal limits  URINALYSIS, ROUTINE W REFLEX MICROSCOPIC (NOT AT [facility]) - Abnormal; Notable for the following:    Ketones, ur >80 (*)    All other components within normal limits  CBG MONITORING, ED - Abnormal; Notable for the following:    Glucose-Capillary 64 (*)    All other components within normal limits  I-STAT BETA HCG BLOOD, ED ([facility], [facility], [facility] ONLY)  I-STAT TROPOININ, ED    Imaging Review Ct Head Wo Contrast  10/21/2015  CLINICAL DATA:  headache, nausea, neck pain and vomiting. Patient had bladder stents put in 3 days ago, her pain has been increasing. Patient fell on floor 2 days ago, it was a  witnessed fall. She denies LOC or hitting head. EXAM: CT HEAD WITHOUT CONTRAST CT CERVICAL SPINE WITHOUT CONTRAST TECHNIQUE: Multidetector CT imaging of the head and cervical spine was performed following the standard protocol without intravenous contrast. Multiplanar CT image reconstructions of the cervical spine were also generated. COMPARISON:  04/27/14 FINDINGS: CT HEAD FINDINGS No mass lesion. No midline shift. No acute hemorrhage or hematoma. No extra-axial fluid collections. No evidence of acute infarction.No skull fracture. CT CERVICAL SPINE FINDINGS Normal anterior-posterior alignment. No acute soft tissue abnormalities. Thyroid is normal. Lung apices are normal. No cervical spine fracture. Reversed lordosis appears to be due to moderate C5-6 degenerative disc disease. Disc osteophytic bulge causes mild canal narrowing. IMPRESSION: No acute intracranial abnormalities. No evidence of cervical spine fracture. Electronically Signed   By: Esperanza Heir M.D.   On: 10/21/2015 17:24   Ct Cervical Spine Wo Contrast  10/21/2015  CLINICAL DATA:  headache, nausea, neck pain and vomiting. Patient had bladder stents put in 3 days ago, her pain has been increasing. Patient fell on floor 2 days ago, it was a witnessed fall. She denies LOC or hitting head. EXAM: CT HEAD WITHOUT CONTRAST CT CERVICAL SPINE WITHOUT CONTRAST TECHNIQUE: Multidetector CT imaging of the head and cervical spine was performed following the standard protocol without intravenous contrast. Multiplanar CT image reconstructions of the cervical spine were also generated. COMPARISON:  04/27/14 FINDINGS: CT HEAD FINDINGS No mass lesion. No midline shift. No acute hemorrhage or hematoma. No extra-axial fluid collections. No evidence of acute infarction.No skull fracture. CT CERVICAL SPINE FINDINGS Normal anterior-posterior alignment. No acute soft tissue abnormalities. Thyroid is normal. Lung apices are normal. No cervical spine fracture. Reversed  lordosis appears to be due to moderate C5-6 degenerative disc disease. Disc osteophytic bulge causes mild canal narrowing. IMPRESSION: No acute intracranial abnormalities. No evidence of cervical spine fracture. Electronically Signed   By: Esperanza Heir M.D.   On: 10/21/2015 17:24   I have personally reviewed and evaluated these images and lab results as part of my medical decision-making.   EKG Interpretation   Date/Time:  Saturday October 21 2015 15:34:24 EDT Ventricular Rate:  92 PR Interval:  126 QRS Duration: 84 QT Interval:  358 QTC Calculation: 443 R Axis:  35 Text Interpretation:  Sinus rhythm Confirmed by Lincoln Brigham [phone]) on  10/21/2015 4:11:47 PM      MDM   Final diagnoses:  Concussion, without loss of consciousness, initial encounter  Acute post-traumatic headache, not intractable  Non-intractable vomiting with nausea, vomiting of unspecified type  Dehydration  Orthostatic lightheadedness  Hypoglycemia  Anorexia  Neck pain  Fall, initial encounter    40 y.o. female here with fall yesterday and HA/N/V/neck and back pain today. She just had surgery 3 days ago, has been on tramadol, stood up in the kitchen and had a witnessed fall, no lightheadedness before just felt "weak" and fell backwards, striking her head on the baseboard in the kitchen. On exam, no scalp tenderness, no focal neuro deficits concerning findings for cauda equina syndrome, with some midline cervical spine tenderness with diffuse paraspinous muscle tenderness throughout, when we attempted to ambulate her HR rose ~25bpm when going from sitting to standing and she felt lightheaded, consistent with dehydration. She appears clinically dry. Likely the combination of narcotic use and dehydration that lead to the fall. Per [employer] CT rules, she doesn't need head imaging, but pt is very concerned and would like one. Will get labs, EKG, trop, orthostatics, U/A, HCG, CT head/neck, and give fluids and pain  meds. Nausea meds given with good relief. Will reassess shortly.   6:36 PM Orthostatics+, likely cause of her lightheadedness with standing. EKG with NSR, unremarkable otherwise. Trop neg, BetaHCG neg. U/A with ketones but otherwise WNL. CBC showing mildly elevated WBC 15.8 which could be from stress demargination given her hypoglycemia, no s/sx of infection noted. BMP showing gluc 56, given food and soda to help with this. Bicarb mildly low at 16, anion gap 18, likely all from dehydration/anorexia. Ct scans neg. Will give second bolus and ensure resolution of orthostasis before discharge. She is eating a sandwich and feels slightly nauseated, will give more zofran. Will reassess shortly  8:35 PM Second liter bolus done, pt feeling much better, nausea resolved, tolerating PO well here. No longer orthostatic with standing, able to ambulate without difficulty, no ongoing lightheadedness when she stands. Will recheck CBG and as long as it's improved we can send her home with zofran/naprosyn for symptoms, discussed staying hydrated and eating regularly spaced out meals with snacks in between, discussed f/up with PCP in 3-5 days for recheck. Will await CBG to result  8:52 PM CBG 64, but pt feeling better, and this is overall improved from prior result, eating well and tolerating PO Well, I feel she can go home and continue to eat and drink, and this will likely help improve her hypoglycemia. Discussed case with my attending Dr. Madilyn Hook who agrees with plan. D/c home with previously discussed plan. I explained the diagnosis and have given explicit precautions to return to the ER including for any other new or worsening symptoms. The patient understands and accepts the medical plan as it's been dictated and I have answered their questions. Discharge instructions concerning home care and prescriptions have been given. The patient is STABLE and is discharged to home in good condition.   BP 103/66 mmHg  Pulse 94   Temp(Src) 99.4 F (37.4 C) (Oral)  Resp 20  SpO2 98%  LMP 10/07/2015  Meds ordered this encounter  Medications  . sodium chloride 0.9 % bolus 1,000 mL    Sig:   . fentaNYL (SUBLIMAZE) injection 50 mcg    Sig:   . sodium chloride 0.9 % bolus 1,000 mL  Sig:   . ondansetron (ZOFRAN) injection 4 mg    Sig:   . ondansetron (ZOFRAN ODT) 8 MG disintegrating tablet    Sig: Take 1 tablet (8 mg total) by mouth every 8 (eight) hours as needed for nausea or vomiting.    Dispense:  10 tablet    Refill:  0    Order Specific Question:  Supervising Provider    Answer:  Hyacinth Meeker, BRIAN [[id number]]  . naproxen (NAPROSYN) 500 MG tablet    Sig: Take 1 tablet (500 mg total) by mouth 2 (two) times daily as needed for mild pain, moderate pain or headache (TAKE WITH MEALS.).    Dispense:  20 tablet    Refill:  0    Order Specific Question:  Supervising Provider    Answer:  Eber Hong [[id number]]     Teria Khachatryan Camprubi-Soms, PA-C 10/21/15 2053  Tilden Fossa, MD 10/22/15 1454

## 2015-10-21 NOTE — ED Notes (Signed)
Gave Pt. Peanut butter and crackers.

## 2015-10-21 NOTE — ED Notes (Signed)
Food and Coke given to patient.

## 2015-10-21 NOTE — ED Notes (Signed)
Per EMS, patient is complaining of headache, nausea, neck pain and vomiting.  Patient had bladder stents put in 3 days ago, her pain has been increased.  Patient fell on floor 2 days ago, it was a witnessed fall.  She denies LOC or hitting head.    EMS administered 4mg  of Zofran  BP:108/68 P:98 R:16 O2: 100% room air  CBG:98

## 2015-10-21 NOTE — ED Notes (Signed)
Pt. Unable to urinate at this time. Will collect urine when pt. Voids. Nurse aware.  

## 2015-10-21 NOTE — Discharge Instructions (Signed)
Use naprosyn or Tylenol for pain. Get plenty of rest, use ice on your head.  Stay in a quiet, not simulating, dark environment. No TV, computer use, video games, or cell phone use until headache is resolved completely. stay VERY WELL hydrated with plenty of fluids, you were dehydrated here which caused your fall and symptoms. Use home tramadol as needed for pain, but don't drive while taking this medication. Eat regular meals and snacks in between to ensure you stay nourished. Follow Up with primary care physician in 3-4 days for recheck of symptoms.  Return to the emergency department if patient becomes lethargic, begins vomiting or other change in mental status, or any changes/worsening symptoms.      Dehydration, Adult Dehydration is a condition in which you do not have enough fluid or water in your body. It happens when you take in less fluid than you lose. Vital organs such as the kidneys, brain, and heart cannot function without a proper amount of fluids. Any loss of fluids from the body can cause dehydration.  Dehydration can range from mild to severe. This condition should be treated right away to help prevent it from becoming severe. CAUSES  This condition may be caused by:  Vomiting.  Diarrhea.  Excessive sweating, such as when exercising in hot or humid weather.  Not drinking enough fluid during strenuous exercise or during an illness.  Excessive urine output.  Fever.  Certain medicines. RISK FACTORS This condition is more likely to develop in:  People who are taking certain medicines that cause the body to lose excess fluid (diuretics).   People who have a chronic illness, such as diabetes, that may increase urination.  Older adults.   People who live at high altitudes.   People who participate in endurance sports.  SYMPTOMS  Mild Dehydration  Thirst.  Dry lips.  Slightly dry mouth.  Dry, warm skin. Moderate Dehydration  Very dry mouth.   Muscle  cramps.   Dark urine and decreased urine production.   Decreased tear production.   Headache.   Light-headedness, especially when you stand up from a sitting position.  Severe Dehydration  Changes in skin.   Cold and clammy skin.   Skin does not spring back quickly when lightly pinched and released.   Changes in body fluids.   Extreme thirst.   No tears.   Not able to sweat when body temperature is high, such as in hot weather.   Minimal urine production.   Changes in vital signs.   Rapid, weak pulse (more than 100 beats per minute when you are sitting still).   Rapid breathing.   Low blood pressure.   Other changes.   Sunken eyes.   Cold hands and feet.   Confusion.  Lethargy and difficulty being awakened.  Fainting (syncope).   Short-term weight loss.   Unconsciousness. DIAGNOSIS  This condition may be diagnosed based on your symptoms. You may also have tests to determine how severe your dehydration is. These tests may include:   Urine tests.   Blood tests.  TREATMENT  Treatment for this condition depends on the severity. Mild or moderate dehydration can often be treated at home. Treatment should be started right away. Do not wait until dehydration becomes severe. Severe dehydration needs to be treated at the hospital. Treatment for Mild Dehydration  Drinking plenty of water to replace the fluid you have lost.   Replacing minerals in your blood (electrolytes) that you may have lost.  Treatment for  Moderate Dehydration  Consuming oral rehydration solution (ORS). Treatment for Severe Dehydration  Receiving fluid through an IV tube.   Receiving electrolyte solution through a feeding tube that is passed through your nose and into your stomach (nasogastric tube or NG tube).  Correcting any abnormalities in electrolytes. HOME CARE INSTRUCTIONS   Drink enough fluid to keep your urine clear or pale yellow.   Drink  water or fluid slowly by taking small sips. You can also try sucking on ice cubes.  Have food or beverages that contain electrolytes. Examples include bananas and sports drinks.  Take over-the-counter and prescription medicines only as told by your health care provider.   Prepare ORS according to the manufacturer's instructions. Take sips of ORS every 5 minutes until your urine returns to normal.  If you have vomiting or diarrhea, continue to try to drink water, ORS, or both.   If you have diarrhea, avoid:   Beverages that contain caffeine.   Fruit juice.   Milk.   Carbonated soft drinks.  Do not take salt tablets. This can lead to the condition of having too much sodium in your body (hypernatremia).  SEEK MEDICAL CARE IF:  You cannot eat or drink without vomiting.  You have had moderate diarrhea during a period of more than 24 hours.  You have a fever. SEEK IMMEDIATE MEDICAL CARE IF:   You have extreme thirst.  You have severe diarrhea.  You have not urinated in 6-8 hours, or you have urinated only a small amount of very dark urine.  You have shriveled skin.  You are dizzy, confused, or both.   This information is not intended to replace advice given to you by your health care provider. Make sure you discuss any questions you have with your health care provider.   Document Released: 07/22/2005 Document Revised: 04/12/2015 Document Reviewed: 12/07/2014 Elsevier Interactive Patient Education 2016 [employer].  Concussion, Adult A concussion is a brain injury. It is caused by:  A hit to the head.  A quick and sudden movement (jolt) of the head or neck. A concussion is usually not life threatening. Even so, it can cause serious problems. If you had a concussion before, you may have concussion-like problems after a hit to your head. HOME CARE General Instructions  Follow your doctor's directions carefully.  Take medicines only as told by your  doctor.  Only take medicines your doctor says are safe.  Do not drink alcohol until your doctor says it is okay. Alcohol and some drugs can slow down healing. They can also put you at risk for further injury.  If you are having trouble remembering things, write them down.  Try to do one thing at a time if you get distracted easily. For example, do not watch TV while making dinner.  Talk to your family members or close friends when making important decisions.  Follow up with your doctor as told.  Watch your symptoms. Tell others to do the same. Serious problems can sometimes happen after a concussion. Older adults are more likely to have these problems.  Tell your teachers, school nurse, school counselor, coach, Event organiser, or work Production designer, theatre/television/film about your concussion. Tell them about what you can or cannot do. They should watch to see if:  It gets even harder for you to pay attention or concentrate.  It gets even harder for you to remember things or learn new things.  You need more time than normal to finish things.  You become  annoyed (irritable) more than before.  You are not able to deal with stress as well.  You have more problems than before.  Rest. Make sure you:  Get plenty of sleep at night.  Go to sleep early.  Go to bed at the same time every day. Try to wake up at the same time.  Rest during the day.  Take naps when you feel tired.  Limit activities where you have to think a lot or concentrate. These include:  Doing homework.  Doing work related to a job.  Watching TV.  Using the computer. Returning To Your Regular Activities Return to your normal activities slowly, not all at once. You must give your body and brain enough time to heal.   Do not play sports or do other athletic activities until your doctor says it is okay.  Ask your doctor when you can drive, ride a bicycle, or work other vehicles or machines. Never do these things if you feel  dizzy.  Ask your doctor about when you can return to work or school. Preventing Another Concussion It is very important to avoid another brain injury, especially before you have healed. In rare cases, another injury can lead to permanent brain damage, brain swelling, or death. The risk of this is greatest during the first 7-10 days after your injury. Avoid injuries by:   Wearing a seat belt when riding in a car.  Not drinking too much alcohol.  Avoiding activities that could lead to a second concussion (such as contact sports).  Wearing a helmet when doing activities like:  Biking.  Skiing.  Skateboarding.  Skating.  Making your home safer by:  Removing things from the floor or stairways that could make you trip.  Using grab bars in bathrooms and handrails by stairs.  Placing non-slip mats on floors and in bathtubs.  Improve lighting in dark areas. GET HELP IF:  It gets even harder for you to pay attention or concentrate.  It gets even harder for you to remember things or learn new things.  You need more time than normal to finish things.  You become annoyed (irritable) more than before.  You are not able to deal with stress as well.  You have more problems than before.  You have problems keeping your balance.  You are not able to react quickly when you should. Get help if you have any of these problems for more than 2 weeks:   Lasting (chronic) headaches.  Dizziness or trouble balancing.  Feeling sick to your stomach (nausea).  Seeing (vision) problems.  Being affected by noises or light more than normal.  Feeling sad, low, down in the dumps, blue, gloomy, or empty (depressed).  Mood changes (mood swings).  Feeling of fear or nervousness about what may happen (anxiety).  Feeling annoyed.  Memory problems.  Problems concentrating or paying attention.  Sleep problems.  Feeling tired all the time. GET HELP RIGHT AWAY IF:   You have bad  headaches or your headaches get worse.  You have weakness (even if it is in one hand, leg, or part of the face).  You have loss of feeling (numbness).  You feel off balance.  You keep throwing up (vomiting).  You feel tired.  One black center of your eye (pupil) is larger than the other.  You twitch or shake violently (convulse).  Your speech is not clear (slurred).  You are more confused, easily angered (agitated), or annoyed than before.  You have  more trouble resting than before.  You are unable to recognize people or places.  You have neck pain.  It is difficult to wake you up.  You have unusual behavior changes.  You pass out (lose consciousness). MAKE SURE YOU:   Understand these instructions.  Will watch your condition.  Will get help right away if you are not doing well or get worse.   This information is not intended to replace advice given to you by your health care provider. Make sure you discuss any questions you have with your health care provider.   Document Released: 07/10/2009 Document Revised: 08/12/2014 Document Reviewed: 02/11/2013 Elsevier Interactive Patient Education 2016 Else[employer] Inc[employer]  Hypoglycemia Low blood sugar (hypoglycemia) means that the level of sugar in your blood is lower than it should be. Signs of low blood sugar include:  Getting sweaty.  Feeling hungry.  Feeling dizzy or weak.  Feeling sleepier than normal.  Feeling nervous.  Headaches.  Having a fast heartbeat. Low blood sugar can happen fast and can be an emergency. Your doctor can do tests to check your blood sugar level. You can have low blood sugar and not have diabetes. HOME CARE  Check your blood sugar as told by your doctor. If it is less than 70 mg/dl or as told by your doctor, take 1 of the following:  3 to 4 glucose tablets.   cup clear juice.   cup soda pop, not diet.  1 cup milk.  5 to 6 hard candies.  Recheck blood sugar after 15 minutes.  Repeat until it is at the right level.  Eat a snack if it is more than 1 hour until the next meal.  Only take medicine as told by your doctor.  Do not skip meals. Eat on time.  Do not drink alcohol except with meals.  Check your blood glucose before driving.  Check your blood glucose before and after exercise.  Always carry treatment with you, such as glucose pills.  Always wear a medical alert bracelet if you have diabetes. GET HELP RIGHT AWAY IF:   Your blood glucose goes below 70 mg/dl or as told by your doctor, and you:  Are confused.  Are not able to swallow.  Pass out (faint).  You cannot treat yourself. You may need someone to help you.  You have low blood sugar problems often.  You have problems from your medicines.  You are not feeling better after 3 to 4 days.  You have vision changes. MAKE SURE YOU:   Understand these instructions.  Will watch this condition.  Will get help right away if you are not doing well or get worse.   This information is not intended to replace advice given to you by your health care provider. Make sure you discuss any questions you have with your health care provider.   Document Released: 10/16/2009 Document Revised: 08/12/2014 Document Reviewed: 03/28/2015 Elsevier Interactive Patient Education 2016 Else[employer] Inc[employer]  Nausea and Vomiting Nausea means you feel sick to your stomach. Throwing up (vomiting) is a reflex where stomach contents come out of your mouth. HOME CARE   Take medicine as told by your doctor.  Do not force yourself to eat. However, you do need to drink fluids.  If you feel like eating, eat a normal diet as told by your doctor.  Eat rice, wheat, potatoes, bread, lean meats, yogurt, fruits, and vegetables.  Avoid high-fat foods.  Drink enough fluids to keep your pee (urine) clear or  pale yellow.  Ask your doctor how to replace body fluid losses (rehydrate). Signs of body fluid loss (dehydration)  include:  Feeling very thirsty.  Dry lips and mouth.  Feeling dizzy.  Dark pee.  Peeing less than normal.  Feeling confused.  Fast breathing or heart rate. GET HELP RIGHT AWAY IF:   You have blood in your throw up.  You have black or bloody poop (stool).  You have a bad headache or stiff neck.  You feel confused.  You have bad belly (abdominal) pain.  You have chest pain or trouble breathing.  You do not pee at least once every 8 hours.  You have cold, clammy skin.  You keep throwing up after 24 to 48 hours.  You have a fever. MAKE SURE YOU:   Understand these instructions.  Will watch your condition.  Will get help right away if you are not doing well or get worse.   This information is not intended to replace advice given to you by your health care provider. Make sure you discuss any questions you have with your health care provider.   Document Released: 01/08/2008 Document Revised: 10/14/2011 Document Reviewed: 12/21/2010 Elsevier Interactive Patient Education 2016 [employer]  Dizziness Dizziness is a common problem. It makes you feel unsteady or lightheaded. You may feel like you are about to pass out (faint). Dizziness can lead to injury if you stumble or fall. Anyone can get dizzy, but dizziness is more common in older adults. This condition can be caused by a number of things, including:  Medicines.  Dehydration.  Illness. HOME CARE Following these instructions may help with your condition: Eating and Drinking  Drink enough fluid to keep your pee (urine) clear or pale yellow. This helps to keep you from getting dehydrated. Try to drink more clear fluids, such as water.  Do not drink alcohol.  Limit how much caffeine you drink or eat if told by your doctor.  Limit how much salt you drink or eat if told by your doctor. Activity  Avoid making quick movements.  When you stand up from sitting in a chair, steady yourself until you feel  okay.  In the morning, first sit up on the side of the bed. When you feel okay, stand slowly while you hold onto something. Do this until you know that your balance is fine.  Move your legs often if you need to stand in one place for a long time. Tighten and relax your muscles in your legs while you are standing.  Do not drive or use heavy machinery if you feel dizzy.  Avoid bending down if you feel dizzy. Place items in your home so that they are easy for you to reach without leaning over. Lifestyle  Do not use any tobacco products, including cigarettes, chewing tobacco, or electronic cigarettes. If you need help quitting, ask your doctor.  Try to lower your stress level, such as with yoga or meditation. Talk with your doctor if you need help. General Instructions  Watch your dizziness for any changes.  Take medicines only as told by your doctor. Talk with your doctor if you think that your dizziness is caused by a medicine that you are taking.  Tell a friend or a family member that you are feeling dizzy. If he or she notices any changes in your behavior, have this person call your doctor.  Keep all follow-up visits as told by your doctor. This is important. GET HELP IF:  Your  dizziness does not go away.  Your dizziness or light-headedness gets worse.  You feel sick to your stomach (nauseous).  You have trouble hearing.  You have new symptoms.  You are unsteady on your feet or you feel like the room is spinning. GET HELP RIGHT AWAY IF:  You throw up (vomit) or have diarrhea and are unable to eat or drink anything.  You have trouble:  Talking.  Walking.  Swallowing.  Using your arms, hands, or legs.  You feel generally weak.  You are not thinking clearly or you have trouble forming sentences. It may take a friend or family member to notice this.  You have:  Chest pain.  Pain in your belly (abdomen).  Shortness of breath.  Sweating.  Your vision  changes.  You are bleeding.  You have a headache.  You have neck pain or a stiff neck.  You have a fever.   This information is not intended to replace advice given to you by your health care provider. Make sure you discuss any questions you have with your health care provider.   Document Released: 07/11/2011 Document Revised: 12/06/2014 Document Reviewed: 07/18/2014 Elsevier Interactive Patient Education [employer]

## 2015-10-24 ENCOUNTER — Telehealth (HOSPITAL_COMMUNITY): Payer: Self-pay

## 2015-11-14 ENCOUNTER — Ambulatory Visit (HOSPITAL_COMMUNITY): Payer: Self-pay | Admitting: Psychiatry

## 2015-12-02 ENCOUNTER — Emergency Department (HOSPITAL_COMMUNITY)
Admission: EM | Admit: 2015-12-02 | Discharge: 2015-12-02 | Disposition: A | Discharge status: Home / Self Care | Payer: Medicaid Other | Attending: Emergency Medicine | Admitting: Emergency Medicine

## 2015-12-02 ENCOUNTER — Encounter (HOSPITAL_COMMUNITY): Payer: Self-pay | Admitting: Nurse Practitioner

## 2015-12-02 DIAGNOSIS — Z880 Allergy status to penicillin: Secondary | ICD-10-CM | POA: Diagnosis not present

## 2015-12-02 DIAGNOSIS — Z8619 Personal history of other infectious and parasitic diseases: Secondary | ICD-10-CM | POA: Diagnosis not present

## 2015-12-02 DIAGNOSIS — N898 Other specified noninflammatory disorders of vagina: Secondary | ICD-10-CM | POA: Diagnosis not present

## 2015-12-02 DIAGNOSIS — R300 Dysuria: Secondary | ICD-10-CM | POA: Insufficient documentation

## 2015-12-02 DIAGNOSIS — R51 Headache: Secondary | ICD-10-CM | POA: Diagnosis not present

## 2015-12-02 DIAGNOSIS — R519 Headache, unspecified: Secondary | ICD-10-CM

## 2015-12-02 DIAGNOSIS — M8508 Fibrous dysplasia (monostotic), other site: Secondary | ICD-10-CM | POA: Insufficient documentation

## 2015-12-02 DIAGNOSIS — F329 Major depressive disorder, single episode, unspecified: Secondary | ICD-10-CM | POA: Insufficient documentation

## 2015-12-02 DIAGNOSIS — Z79899 Other long term (current) drug therapy: Secondary | ICD-10-CM | POA: Insufficient documentation

## 2015-12-02 DIAGNOSIS — J449 Chronic obstructive pulmonary disease, unspecified: Secondary | ICD-10-CM | POA: Insufficient documentation

## 2015-12-02 DIAGNOSIS — K219 Gastro-esophageal reflux disease without esophagitis: Secondary | ICD-10-CM | POA: Insufficient documentation

## 2015-12-02 DIAGNOSIS — F909 Attention-deficit hyperactivity disorder, unspecified type: Secondary | ICD-10-CM | POA: Insufficient documentation

## 2015-12-02 DIAGNOSIS — F411 Generalized anxiety disorder: Secondary | ICD-10-CM | POA: Diagnosis not present

## 2015-12-02 DIAGNOSIS — L293 Anogenital pruritus, unspecified: Secondary | ICD-10-CM | POA: Diagnosis not present

## 2015-12-02 DIAGNOSIS — Z792 Long term (current) use of antibiotics: Secondary | ICD-10-CM | POA: Insufficient documentation

## 2015-12-02 LAB — URINALYSIS, ROUTINE W REFLEX MICROSCOPIC
Bilirubin Urine: NEGATIVE
Glucose, UA: NEGATIVE mg/dL
Hgb urine dipstick: NEGATIVE
Ketones, ur: NEGATIVE mg/dL
Leukocytes, UA: NEGATIVE
Nitrite: NEGATIVE
Protein, ur: NEGATIVE mg/dL
Specific Gravity, Urine: 1.008 (ref 1.005–1.030)
pH: 6.5 (ref 5.0–8.0)

## 2015-12-02 LAB — WET PREP, GENITAL
Clue Cells Wet Prep HPF POC: NONE SEEN
Sperm: NONE SEEN
Trich, Wet Prep: NONE SEEN
Yeast Wet Prep HPF POC: NONE SEEN

## 2015-12-02 MED ORDER — ACETAMINOPHEN 325 MG PO TABS
325.0000 mg | ORAL_TABLET | Freq: Once | ORAL | Status: AC
  Administered 2015-12-02: 325 mg via ORAL
  Filled 2015-12-02: qty 1

## 2015-12-02 MED ORDER — DEXAMETHASONE SODIUM PHOSPHATE 10 MG/ML IJ SOLN
10.0000 mg | Freq: Once | INTRAMUSCULAR | Status: AC
  Administered 2015-12-02: 10 mg via INTRAVENOUS
  Filled 2015-12-02: qty 1

## 2015-12-02 MED ORDER — SODIUM CHLORIDE 0.9 % IV BOLUS (SEPSIS)
1000.0000 mL | Freq: Once | INTRAVENOUS | Status: AC
Start: 2015-12-02 — End: 2015-12-02
  Administered 2015-12-02: 1000 mL via INTRAVENOUS

## 2015-12-02 MED ORDER — KETOROLAC TROMETHAMINE 30 MG/ML IJ SOLN
30.0000 mg | Freq: Once | INTRAMUSCULAR | Status: AC
  Administered 2015-12-02: 30 mg via INTRAVENOUS
  Filled 2015-12-02: qty 1

## 2015-12-02 MED ORDER — LORAZEPAM 2 MG/ML IJ SOLN
0.5000 mg | Freq: Once | INTRAMUSCULAR | Status: DC
  Filled 2015-12-02: qty 1

## 2015-12-02 MED ORDER — DIPHENHYDRAMINE HCL 50 MG/ML IJ SOLN
25.0000 mg | Freq: Once | INTRAMUSCULAR | Status: AC
  Administered 2015-12-02: 25 mg via INTRAVENOUS
  Filled 2015-12-02: qty 1

## 2015-12-02 MED ORDER — METOCLOPRAMIDE HCL 5 MG/ML IJ SOLN
10.0000 mg | Freq: Once | INTRAMUSCULAR | Status: AC
  Administered 2015-12-02: 10 mg via INTRAVENOUS
  Filled 2015-12-02: qty 2

## 2015-12-02 NOTE — ED Notes (Addendum)
She c/o frequent headaches, fatigue, and nausea since she fell and injured her head on 4/17. She was diagnosed with a concussion on 4/17 at [facility]. she hit her head again on a door frame this week. She is A&Ox4, breathing easily

## 2015-12-02 NOTE — ED Provider Notes (Signed)
CSN: [id number]     Arrival date & time 12/02/15  1659 History   First MD Initiated Contact with Patient 12/02/15 1828     Chief Complaint  Patient presents with  . Head Injury     (Consider location/radiation/quality/duration/timing/severity/associated sxs/prior Treatment) HPI Comments: Patient is a 40 year old female history of concussion who presents with headache. Patient stated she fell on March 17 and hit her head, without loss of consciousness. The patient was diagnosed with concussion. The patient has had headaches increasing in frequency and severity since fall. She states that she has a headache daily for most of the day. She describes the headaches as frontal, between her eyes. She rates her pain as a 10/10. She has associated photophobia and nausea. Patient has tried ibuprofen for her headaches with no relief. She has been taking Zofran for her nausea, which has helped. The patient reports she hit her head 5 days ago on the doorjam, but did not loose consciousness. She is not feeling her headaches have worsened since then. She also states that she has had increasing irritability. The patient also reports that she feels she may have a yeast infection, as she has had vaginal discharge and burning on urination. Patient is not sexually active, and hasn't been for a year. Patient has no concern for STD exposure. She is not on birth control. Patient denies chest pain, shortness of breath, abdominal pain, vomiting, diarrhea.  Patient is a 40 y.o. female presenting with head injury. The history is provided by the patient.  Head Injury Associated symptoms: headache and nausea   Associated symptoms: no vomiting     Past Medical History  Diagnosis Date  . SMAS (superior mesenteric artery syndrome) (HCC)   . GERD (gastroesophageal reflux disease)   . Osteopenia   . Anorexia nervosa without bulimia   . COPD (chronic obstructive pulmonary disease) (HCC)   . Impulse control disorder   . GAD  (generalized anxiety disorder)   . Moderate major depression (HCC)   . ADD (attention deficit disorder)   . History of gastric ulcer   . History of Helicobacter pylori infection     after 2003  . Urge urinary incontinence   . Chronic constipation   . History of gastric restrictive surgery    Past Surgical History  Procedure Laterality Date  . Dilation and curettage of uterus  2005  . Orif right wrist fx  2013    REMOVAL HARDWARE X2 in  2013--  No retained hardware  . Laparoscopic gastric restrictive duodenal procedure (duodenal switch)  12/ 2003  . Interstim implant placement N/A 10/18/2015    Procedure: [name] IMPLANT FIRST STAGE;  Surgeon: Jerilee Field, MD;  Location: [facility];  Service: Urology;  Laterality: N/A;  . Interstim implant placement N/A 10/18/2015    Procedure: INTERSTIM IMPLANT SECOND STAGE;  Surgeon: Jerilee Field, MD;  Location: [facility];  Service: Urology;  Laterality: N/A;   Family History  Problem Relation Age of Onset  . Alcohol abuse Mother   . Anxiety disorder Mother   . Depression Mother     severe with hx of suicide attempt. treated with ECT  . COPD Mother   . Anxiety disorder Father   . Drug abuse Father   . COPD Father   . Alcohol abuse Maternal Uncle   . Anxiety disorder Maternal Grandmother   . Depression Maternal Grandmother   . ADD / ADHD Brother    Social History  Substance Use Topics  .  Smoking status: Never Smoker   . Smokeless tobacco: Never Used  . Alcohol Use: No   OB History    No data available     Review of Systems  Constitutional: Negative for fever and chills.  HENT: Negative for facial swelling and sore throat.   Eyes: Positive for photophobia. Negative for visual disturbance.  Respiratory: Negative for shortness of breath.   Cardiovascular: Negative for chest pain.  Gastrointestinal: Positive for nausea. Negative for vomiting and abdominal pain.  Genitourinary: Positive for  dysuria and vaginal discharge.  Musculoskeletal: Negative for back pain.  Skin: Negative for rash and wound.  Neurological: Positive for headaches.  Psychiatric/Behavioral: The patient is not nervous/anxious.       Allergies  Bactrim; Penicillins; Ciprofloxacin; Morphine and related; and Diflucan  Home Medications   Prior to Admission medications   Medication Sig Start Date End Date Taking? Authorizing Provider  albuterol (PROAIR HFA) 108 (90 BASE) MCG/ACT inhaler Inhale 2 puffs into the lungs every 6 (six) hours as needed for wheezing or shortness of breath. 11/15/14   Leslye Peer, MD  cephALEXin (KEFLEX) 500 MG capsule Take 1 capsule (500 mg total) by mouth 2 (two) times daily. 10/18/15   Jerilee Field, MD  Cholecalciferol (VITAMIN D3) 2000 units TABS Take 1 tablet by mouth daily.    Historical Provider, MD  clonazePAM (KLONOPIN) 1 MG tablet Take 1 tablet (1 mg total) by mouth daily as needed for anxiety. 10/03/15 10/02/16  Oletta Darter, MD  dicyclomine (BENTYL) 10 MG capsule Take 10 mg by mouth 4 (four) times daily -  before meals and at bedtime.    Historical Provider, MD  fluconazole (DIFLUCAN) 150 MG tablet Take 1 tablet (150 mg total) by mouth once. May repeat in 4 days 10/18/15   Jerilee Field, MD  ibuprofen (ADVIL,MOTRIN) 200 MG tablet Take 800 mg by mouth every 6 (six) hours as needed for moderate pain. Reported on 07/20/2015    Historical Provider, MD  lamoTRIgine (LAMICTAL) 150 MG tablet Take 2 tablets (300 mg total) by mouth daily. Patient taking differently: Take 300 mg by mouth every morning.  10/03/15   Oletta Darter, MD  lubiprostone (AMITIZA) 8 MCG capsule Take 8 mcg by mouth daily with breakfast.    Historical Provider, MD  naproxen (NAPROSYN) 500 MG tablet Take 1 tablet (500 mg total) by mouth 2 (two) times daily as needed for mild pain, moderate pain or headache (TAKE WITH MEALS.). 10/21/15   Mercedes Camprubi-Soms, PA-C  OLANZapine (ZYPREXA) 7.5 MG tablet Take 1  tablet (7.5 mg total) by mouth at bedtime. 10/03/15   Oletta Darter, MD  ondansetron (ZOFRAN ODT) 8 MG disintegrating tablet Take 1 tablet (8 mg total) by mouth every 8 (eight) hours as needed for nausea or vomiting. 10/21/15   Mercedes Camprubi-Soms, PA-C  ondansetron (ZOFRAN) 8 MG tablet Take 8 mg by mouth 3 (three) times daily as needed for nausea or vomiting.     Historical Provider, MD  pantoprazole (PROTONIX) 40 MG tablet Take 40 mg by mouth every morning.  12/09/14   Historical Provider, MD  ranitidine (ZANTAC) 150 MG tablet Take 150 mg by mouth every morning. Reported on 07/20/2015    Historical Provider, MD  sertraline (ZOLOFT) 100 MG tablet Take 2 tablets (200 mg total) by mouth daily. Patient taking differently: Take 200 mg by mouth every morning.  10/03/15 10/02/16  Oletta Darter, MD  traMADol (ULTRAM) 50 MG tablet Take 1 tablet (50 mg total) by mouth every 6 (  six) hours as needed for moderate pain. 10/18/15   Jerilee Field, MD   BP 101/82 mmHg  Pulse 81  Temp(Src) 98.7 F (37.1 C) (Oral)  Resp 17  SpO2 100% Physical Exam  Constitutional: She appears well-developed and well-nourished. No distress.  HENT:  Head: Normocephalic and atraumatic.  Mouth/Throat: Oropharynx is clear and moist. No oropharyngeal exudate.  Eyes: Conjunctivae and EOM are normal. Pupils are equal, round, and reactive to light. Right eye exhibits no discharge. Left eye exhibits no discharge. No scleral icterus.  Neck: Normal range of motion. Neck supple. No thyromegaly present.  Cardiovascular: Normal rate, regular rhythm, normal heart sounds and intact distal pulses.  Exam reveals no gallop and no friction rub.   No murmur heard. Pulmonary/Chest: Effort normal and breath sounds normal. No stridor. No respiratory distress. She has no wheezes. She has no rales.  Abdominal: Soft. Bowel sounds are normal. She exhibits no distension. There is no tenderness. There is no rebound and no guarding.  Musculoskeletal: She  exhibits no edema.  CN 3-12 intact, normal sensation throughout, 5/5 strength throughout  Lymphadenopathy:    She has no cervical adenopathy.  Neurological: She is alert. Coordination normal.  Skin: Skin is warm and dry. No rash noted. She is not diaphoretic. No pallor.  Psychiatric: She has a normal mood and affect.  Nursing note and vitals reviewed.   ED Course  Procedures (including critical care time) Labs Review Labs Reviewed  WET PREP, GENITAL - Abnormal; Notable for the following:    WBC, Wet Prep HPF POC FEW (*)    All other components within normal limits  URINE CULTURE  URINALYSIS, ROUTINE W REFLEX MICROSCOPIC (NOT AT [facility])  GC/CHLAMYDIA PROBE AMP ([place]) NOT AT [facility]    Imaging Review No results found. I have personally reviewed and evaluated these images and lab results as part of my medical decision-making.   EKG Interpretation None      7:50pm Patient reporting anxiety following medication administration. Most likely Reglan. Given with Benadryl. 8:30pm akathisia and anxiety improved  MDM   Most likely postconcussive syndrome. Headache cocktail given in ED with some relief (10/10 to 8/10). Toradol added and given Tylenol. 1 L NS given in NAD. Normal neuro exam, no focal deficits. On discussion with Dr. Clydene Pugh, we will not rescan patient's head. Wet prep shows few white cells, no yeast. GC chlamydia sent and patient to be made aware of results in 2-3 days. Patient advised to follow-up with primary care provider if vaginal discharge, and dysuria persists. Patient to follow up with PCP and neurology for postconcussion headaches. Patient advised to reduce her screen time, as she admits to having increased screen time recently. Patient continued to look at her phone and tablet immediately following this conversation. Patient has had lights on and sits comfortably in the room. Patient stable and in satisfactory condition on discharge.   Final diagnoses:  Vaginal  discharge  Vaginal itching  Dysuria  Bad headache       Karen Holes, PA-C 12/02/15 [address] Karen Boutin, PA-C 12/02/15 2103  Lyndal Pulley, MD 12/03/15 [phone]

## 2015-12-02 NOTE — ED Notes (Signed)
Patient verbalized understanding of discharge instructions and denies any further needs or questions at this time. VS stable. Patient ambulatory with steady gait, assisted to ED entrance in wheelchair.  

## 2015-12-02 NOTE — ED Notes (Signed)
Pelvic cart at bedside. 

## 2015-12-02 NOTE — ED Notes (Signed)
This RN pulled the lorazepam when it was ordered for the pt and had Shanda Bumps, [employer] it with me - Shanda Bumps put in her number and we closed the drawer. Just before popping the top and pulling up the medication to waste, Trinna Post - PA-C came in the med room and told this RN that she was going to discontinue it per MD order. The medication was not opened, but cannot be returned to the [facility] under the pt's name because it now states "insufficient quantity." Consulting civil engineer and pharmacist both notified. Windell Moulding in the main pharmacy told this RN to put in a blank note explaining what happened and to bring medication down to her/pharmacy.

## 2015-12-02 NOTE — Discharge Instructions (Signed)
Treatment: Reduce your screen time daily to 15-20 minutes. Drink 8 glasses of water daily. Get plenty of rest. You will be made aware of any positive STD culture in 2-3 days. If your results are positive, please make any sexual partners aware and encouraged them to be treated.  Follow-up: Please follow-up with your primary care provider for further evaluation of your headaches and vaginal discharge if it continues. Please follow-up with neurology following your appointment with primary care provider. A neurology appointment was requested for you today. Please return to emergency department if you develop any new or worsening symptoms, such as slurring of speech, inability to move any of your limbs, seizures, or any other concerning symptoms.   Post-Concussion Syndrome Post-concussion syndrome describes the symptoms that can occur after a head injury. These symptoms can last from weeks to months. CAUSES  It is not clear why some head injuries cause post-concussion syndrome. It can occur whether your head injury was mild or severe and whether you were wearing head protection or not.  SIGNS AND SYMPTOMS  Memory difficulties.  Dizziness.  Headaches.  Double vision or blurry vision.  Sensitivity to light.  Hearing difficulties.  Depression.  Tiredness.  Weakness.  Difficulty with concentration.  Difficulty sleeping or staying asleep.  Vomiting.  Poor balance or instability on your feet.  Slow reaction time.  Difficulty learning and remembering things you have heard. DIAGNOSIS  There is no test to determine whether you have post-concussion syndrome. Your health care provider may order an imaging scan of your brain, such as a CT scan, to check for other problems that may be causing your symptoms (such as a severe injury inside your skull). TREATMENT  Usually, these problems disappear over time without medical care. Your health care provider may prescribe medicine to help ease your  symptoms. It is important to follow up with a neurologist to evaluate your recovery and address any lingering symptoms or issues. HOME CARE INSTRUCTIONS   Take medicines only as directed by your health care provider. Do not take aspirin. Aspirin can slow blood clotting.  Sleep with your head slightly elevated to help with headaches.  Avoid any situation where there is potential for another head injury. This includes football, hockey, soccer, basketball, martial arts, downhill snow sports, and horseback riding. Your condition will get worse every time you experience a concussion. You should avoid these activities until you are evaluated by the appropriate follow-up health care providers.  Keep all follow-up visits as directed by your health care provider. This is important. SEEK MEDICAL CARE IF:  You have increased problems paying attention or concentrating.  You have increased difficulty remembering or learning new information.  You need more time to complete tasks or assignments than before.  You have increased irritability or decreased ability to cope with stress.  You have more symptoms than before. Seek medical care if you have any of the following symptoms for more than two weeks after your injury:  Lasting (chronic) headaches.  Dizziness or balance problems.  Nausea.  Vision problems.  Increased sensitivity to noise or light.  Depression or mood swings.  Anxiety or irritability.  Memory problems.  Difficulty concentrating or paying attention.  Sleep problems.  Feeling tired all the time. SEEK IMMEDIATE MEDICAL CARE IF:  You have confusion or unusual drowsiness.  Others find it difficult to wake you up.  You have nausea or persistent, forceful vomiting.  You feel like you are moving when you are not (vertigo). Your eyes  may move rapidly back and forth.  You have convulsions or faint.  You have severe, persistent headaches that are not relieved by  medicine.  You cannot use your arms or legs normally.  One of your pupils is larger than the other.  You have clear or bloody discharge from your nose or ears.  Your problems are getting worse, not better. MAKE SURE YOU:  Understand these instructions.  Will watch your condition.  Will get help right away if you are not doing well or get worse.   This information is not intended to replace advice given to you by your health care provider. Make sure you discuss any questions you have with your health care provider.   Document Released: 01/11/2002 Document Revised: 08/12/2014 Document Reviewed: 10/27/2013 Elsevier Interactive Patient Education [employer]

## 2015-12-04 LAB — URINE CULTURE: Culture: NO GROWTH

## 2015-12-04 LAB — GC/CHLAMYDIA PROBE AMP (~~LOC~~) NOT AT ARMC
Chlamydia: NEGATIVE
Neisseria Gonorrhea: NEGATIVE

## 2015-12-05 ENCOUNTER — Ambulatory Visit (INDEPENDENT_AMBULATORY_CARE_PROVIDER_SITE_OTHER): Payer: No Typology Code available for payment source | Admitting: Psychiatry

## 2015-12-05 ENCOUNTER — Encounter (HOSPITAL_COMMUNITY): Payer: Self-pay | Admitting: Psychiatry

## 2015-12-05 VITALS — BP 130/78 | HR 89 | Ht 63.0 in | Wt 105.0 lb

## 2015-12-05 DIAGNOSIS — F1220 Cannabis dependence, uncomplicated: Secondary | ICD-10-CM | POA: Diagnosis not present

## 2015-12-05 DIAGNOSIS — F639 Impulse disorder, unspecified: Secondary | ICD-10-CM | POA: Diagnosis not present

## 2015-12-05 DIAGNOSIS — F411 Generalized anxiety disorder: Secondary | ICD-10-CM

## 2015-12-05 DIAGNOSIS — F900 Attention-deficit hyperactivity disorder, predominantly inattentive type: Secondary | ICD-10-CM

## 2015-12-05 DIAGNOSIS — F331 Major depressive disorder, recurrent, moderate: Secondary | ICD-10-CM | POA: Diagnosis not present

## 2015-12-05 MED ORDER — SERTRALINE HCL 100 MG PO TABS: 200.0000 mg | ORAL_TABLET | Freq: Every day | ORAL | Status: DC

## 2015-12-05 MED ORDER — CLONAZEPAM 1 MG PO TABS: 1.0000 mg | ORAL_TABLET | Freq: Every day | ORAL | Status: DC | PRN

## 2015-12-05 MED ORDER — LAMOTRIGINE 150 MG PO TABS: 300.0000 mg | ORAL_TABLET | Freq: Every day | ORAL | Status: DC

## 2015-12-05 MED ORDER — OLANZAPINE 7.5 MG PO TABS: 7.5000 mg | ORAL_TABLET | Freq: Every day | ORAL | Status: DC

## 2015-12-05 NOTE — Progress Notes (Signed)
Patient ID: Clois Montavon, female   DOB: Sep 28, 1975, 40 y.o.   MRN: [id number]  [facility] [address] Progress Note  Everlene Cunning [id number] 40 y.o.  12/05/2015 10:37 AM  Chief Complaint: "I am going to [place] in [place]"  History of Present Illness: States she is not doing well.   Concentration remains poor. She is easily distracted and has poor memory.   Appetite is poor and she is eating 1 meals/day. Pt is 99 lbs today and wants to increase to 115lbs but is not gaining weight. Pt is restricting but only eating one decent meal at dinner. Pt is struggling to swallow food. She plans to go to [place] as soon as they approve her.   The fighting with her roommate and best friend has decreased. Pt remains irritable but it is better. Pt was so angry due to stopping Zyprexa and Zoloft for several weeks.   Pt states she is depressed. Pt has been isolating herself and is endorsing anhedonia. Pt is overly sensitive. She is still not working and has no desire to do anything. Pt is not going out and spends her time doing nothing. Pt sits on the couch all day. Denies worthlessness and hopelessness.   Energy is poor. Sleeping poorly and getting about 3-4 hrs/night.   Anxiety is increased and the level is thru the roof. She is picking at her skin all day. It makes her feel bad about herself. States picking at her scabs gives her relief.    Suicidal Ideation: No Plan Formed: No Patient has means to carry out plan: No  Homicidal Ideation: No Plan Formed: No Patient has means to carry out plan: No  Review of Systems: Psychiatric: Agitation: Yes Hallucination: No Depressed Mood: Yes Insomnia: Yes Hypersomnia: No Altered Concentration: Yes Feels Worthless: No Grandiose Ideas: No Belief In Special Powers: No New/Increased Substance Abuse: No Compulsions: Yes  Neurologic: Headache: No Seizure: No Paresthesias: No   Review of Systems  Constitutional: Positive for weight loss. Negative  for fever and chills.  HENT: Negative for congestion, ear pain, nosebleeds and sore throat.   Eyes: Negative for blurred vision, double vision and pain.  Respiratory: Negative for cough, sputum production and wheezing.   Cardiovascular: Negative for chest pain, palpitations and leg swelling.  Gastrointestinal: Positive for heartburn and abdominal pain. Negative for nausea, vomiting and diarrhea.  Musculoskeletal: Negative for back pain, joint pain and neck pain.  Skin: Negative for itching and rash.  Neurological: Negative for dizziness, tremors, sensory change, seizures, loss of consciousness and headaches.  Psychiatric/Behavioral: Positive for depression. Negative for suicidal ideas, hallucinations and substance abuse. The patient is nervous/anxious and has insomnia.      Past Medical, Family, Social History: Endorsing family hx of depression, ADD, alcohol abuse and anxiety disorder. Pt is living with her friend in [place], [place]. She is divorced and was married for 5 yrs. Pt has a GED. Currently unemployed and on disability. Pt smokes a few cigs a week. She smokes THC thru the day. Denies alcohol use.   Past Medical History  Diagnosis Date  . SMAS (superior mesenteric artery syndrome) (HCC)   . GERD (gastroesophageal reflux disease)   . Osteopenia   . Anorexia nervosa without bulimia   . COPD (chronic obstructive pulmonary disease) (HCC)   . Impulse control disorder   . GAD (generalized anxiety disorder)   . Moderate major depression (HCC)   . ADD (attention deficit disorder)   . History of gastric ulcer   .  History of Helicobacter pylori infection     after 2003  . Urge urinary incontinence   . Chronic constipation   . History of gastric restrictive surgery     Outpatient Encounter Prescriptions as of 12/05/2015  Medication Sig  . albuterol (PROAIR HFA) 108 (90 BASE) MCG/ACT inhaler Inhale 2 puffs into the lungs every 6 (six) hours as needed for wheezing or shortness of  breath.  . cephALEXin (KEFLEX) 500 MG capsule Take 1 capsule (500 mg total) by mouth 2 (two) times daily.  . Cholecalciferol (VITAMIN D3) 2000 units TABS Take 1 tablet by mouth daily.  . clonazePAM (KLONOPIN) 1 MG tablet Take 1 tablet (1 mg total) by mouth daily as needed for anxiety.  . dicyclomine (BENTYL) 10 MG capsule Take 10 mg by mouth 4 (four) times daily -  before meals and at bedtime.  . fluconazole (DIFLUCAN) 150 MG tablet Take 1 tablet (150 mg total) by mouth once. May repeat in 4 days  . ibuprofen (ADVIL,MOTRIN) 200 MG tablet Take 800 mg by mouth every 6 (six) hours as needed for moderate pain. Reported on 07/20/2015  . lamoTRIgine (LAMICTAL) 150 MG tablet Take 2 tablets (300 mg total) by mouth daily. (Patient taking differently: Take 300 mg by mouth every morning. )  . lubiprostone (AMITIZA) 8 MCG capsule Take 8 mcg by mouth daily with breakfast.  . naproxen (NAPROSYN) 500 MG tablet Take 1 tablet (500 mg total) by mouth 2 (two) times daily as needed for mild pain, moderate pain or headache (TAKE WITH MEALS.).  Marland Kitchen OLANZapine (ZYPREXA) 7.5 MG tablet Take 1 tablet (7.5 mg total) by mouth at bedtime.  . ondansetron (ZOFRAN ODT) 8 MG disintegrating tablet Take 1 tablet (8 mg total) by mouth every 8 (eight) hours as needed for nausea or vomiting.  . ondansetron (ZOFRAN) 8 MG tablet Take 8 mg by mouth 3 (three) times daily as needed for nausea or vomiting.   . pantoprazole (PROTONIX) 40 MG tablet Take 40 mg by mouth every morning.   . ranitidine (ZANTAC) 150 MG tablet Take 150 mg by mouth every morning. Reported on 07/20/2015  . sertraline (ZOLOFT) 100 MG tablet Take 2 tablets (200 mg total) by mouth daily. (Patient taking differently: Take 200 mg by mouth every morning. )  . traMADol (ULTRAM) 50 MG tablet Take 1 tablet (50 mg total) by mouth every 6 (six) hours as needed for moderate pain.   No facility-administered encounter medications on file as of 12/05/2015.    Past Psychiatric  History/Hospitalization(s): Anxiety: Yes Bipolar Disorder: No Depression: Yes Mania: No Psychosis: No Schizophrenia: No Personality Disorder: No Hospitalization for psychiatric illness: Yes numerous- 2 intensive eating program treatments in 2013 and 2014. BH eating d/o with Dr. Orland Dec Nov 2011. Multiple inpt hospitalizations at [facility] (2011, 2011, 2005), [place] (3x), [facility], [place], [place] (06/2013).  History of Electroconvulsive Shock Therapy: No Prior Suicide Attempts: No  Previous meds: Risperdal, Zyprexa, Seroquel, Paxil, Prozac, Remeron, Cymbalta  Physical Exam: Constitutional:  BP 130/78 mmHg  Pulse 89  Ht 5\' 3"  (1.6 m)  Wt 105 lb (47.628 kg)  BMI 18.60 kg/m2  Filed Vitals:   12/05/15 1054  Height: 5\' 3"  (1.6 m)  Weight: 105 lb (47.628 kg)   Filed Vitals:   12/05/15 1054  BP: 130/78  Pulse: 89     General Appearance: alert, oriented, no acute distress  Musculoskeletal: Strength & Muscle Tone: within normal limits Gait & Station: normal Patient leans: straight  Mental Status Examination/Evaluation:  Objective: Attitude: Calm and cooperative  Appearance: dishelved, thin. visible scars and scabs all over face. Appears older than stated age.   Eye Contact::  Good  Speech:  Clear and Coherent and Normal Rate  Volume:  Normal  Mood:  Depressed and anxious  Affect:  Congruent  Thought Process:  Goal Directed  Orientation:  Full (Time, Place, and Person)  Thought Content:  Negative  Suicidal Thoughts:  No  Homicidal Thoughts:  No  Judgement:  Fair  Insight:  Fair  Concentration: good  Memory: Immediate-fair  Recent-fair Remote-fair  Recall: fair  Language: fair  Gait and Station: normal  [employer] of Knowledge: average  Psychomotor Activity:  Normal  Akathisia:  No  Handed:  Right  AIMS (if indicated):  Facial and Oral Movements  Muscles of Facial Expression: None, normal  Lips and Perioral Area: None, normal  Jaw: None, normal  Tongue:  None, normal Extremity Movements: Upper (arms, wrists, hands, fingers): None, normal  Lower (legs, knees, ankles, toes): None, normal,  Trunk Movements:  Neck, shoulders, hips: None, normal,  Overall Severity : Severity of abnormal movements (highest score from questions above): None, normal  Incapacitation due to abnormal movements: None, normal  Patient's awareness of abnormal movements (rate only patient's report): No Awareness, Dental Status  Current problems with teeth and/or dentures?: No  Does patient usually wear dentures?: No    Assets:  Communication Skills Desire for Improvement Financial Resources/Insurance Housing Social Support Architect (Choose Three): Review of Psycho-Social Stressors (1), Review or order clinical lab tests (1), Established Problem, Worsening (2), Review of Last Therapy Session (1) and Review of Medication Regimen & Side Effects (2)  Assessment: AXIS I  Axis I: Impulse control disorder, Anorexia Nervosa, Cannabis dependence, MDD- moderate, recurrent,  GAD, ADD- inattentive type, Insomnia  AXIS II  Deferred       Treatment Plan/Recommendations:  Plan of Care:   Medication management with supportive therapy. Risks/benefits and SE of the medication discussed. Pt verbalized understanding and verbal consent obtained for treatment.  Affirm with the patient that the medications are taken as ordered. Patient expressed understanding of how their medications were to be used.  -worsening of symptoms  - will assist in helping pt obtain admission to [facility] for eating disorder  Laboratory: reviewed labs glu 142,, WBC elevated, urine preg neg  EKG Qtc 443, sinus rhythm  Psychotherapy:Therapy: brief supportive therapy provided. Discussed psychosocial stressors in detail.      Medications:   -Continue Lamictal 300mg  for mood stabilization -d/c Wellbutrin XL  -Zyprexa 7.5mg  po qHS for mood stabalization,  adjunct for depression and anxiety.  -  Zoloft 200mg  po qD for mood and anxiety  -given 30 tabs of Klonopin 1mg  po qD prn anxiety.  - avoid stimulants if possible due to appetite suppression - Daytrana patch as it was not approved by PCP  Routine PRN Medications: No  Consultations: given info for therapist   Safety Concerns: Pt denies SI and is at an acute low risk for suicide.Patient told to call clinic if any problems occur. Patient advised to go to ER  if she should develop SI/HI, side effects, or if symptoms worsen. Has crisis numbers to call if needed. Pt verbalized understanding.   Other: F/up in 2 months or sooner if needed      , MD 12/05/2015

## 2015-12-06 ENCOUNTER — Telehealth (HOSPITAL_COMMUNITY): Payer: Self-pay | Admitting: *Deleted

## 2015-12-06 NOTE — Telephone Encounter (Signed)
Judeth Cornfield with [facility] called to schedule a Doc to Doc review today with Dr. Michae Kava.  Explained that she is not in today and will return 5/4. Was told that it must be done today or it will be reviewed by their MD from the notes given earlier. They do not feel the patient meets inpatient criteria. Called Dr. Michae Kava who agreed to schedule call today. Returned call to [facility] and call was scheduled for 3pm today with Dr. Ninfa Linden. Notified Michae Kava of time that Ninfa Linden will be calling her to discuss the case.

## 2016-01-02 ENCOUNTER — Encounter: Payer: Self-pay | Admitting: Emergency Medicine

## 2016-02-13 ENCOUNTER — Ambulatory Visit (HOSPITAL_COMMUNITY): Payer: Self-pay | Admitting: Psychiatry

## 2016-02-23 ENCOUNTER — Other Ambulatory Visit: Payer: Self-pay | Admitting: Family

## 2016-02-23 ENCOUNTER — Ambulatory Visit
Admission: RE | Admit: 2016-02-23 | Discharge: 2016-02-23 | Disposition: A | Discharge status: Home / Self Care | Payer: Medicare Other | Source: Ambulatory Visit | Attending: Family | Admitting: Family

## 2016-02-23 DIAGNOSIS — M2550 Pain in unspecified joint: Secondary | ICD-10-CM

## 2016-03-05 ENCOUNTER — Other Ambulatory Visit (HOSPITAL_COMMUNITY): Payer: Self-pay

## 2016-03-05 DIAGNOSIS — F411 Generalized anxiety disorder: Secondary | ICD-10-CM

## 2016-03-05 DIAGNOSIS — F331 Major depressive disorder, recurrent, moderate: Secondary | ICD-10-CM

## 2016-03-05 DIAGNOSIS — F639 Impulse disorder, unspecified: Secondary | ICD-10-CM

## 2016-03-05 MED ORDER — OLANZAPINE 7.5 MG PO TABS: 7.5000 mg | ORAL_TABLET | Freq: Every day | ORAL | 1 refills | Status: DC

## 2016-03-05 MED ORDER — SERTRALINE HCL 100 MG PO TABS: 200.0000 mg | ORAL_TABLET | Freq: Every day | ORAL | 1 refills | Status: DC

## 2016-03-05 MED ORDER — CLONAZEPAM 1 MG PO TABS: 1.0000 mg | ORAL_TABLET | Freq: Every day | ORAL | 1 refills | Status: DC | PRN

## 2016-03-05 MED ORDER — LAMOTRIGINE 150 MG PO TABS: 300.0000 mg | ORAL_TABLET | Freq: Every day | ORAL | 1 refills | Status: DC

## 2016-04-25 ENCOUNTER — Ambulatory Visit (HOSPITAL_COMMUNITY): Payer: Self-pay | Admitting: Psychiatry

## 2016-05-03 ENCOUNTER — Other Ambulatory Visit (HOSPITAL_COMMUNITY): Payer: Self-pay | Admitting: Psychiatry

## 2016-05-03 DIAGNOSIS — F411 Generalized anxiety disorder: Secondary | ICD-10-CM

## 2016-05-03 DIAGNOSIS — F639 Impulse disorder, unspecified: Secondary | ICD-10-CM

## 2016-05-03 DIAGNOSIS — F331 Major depressive disorder, recurrent, moderate: Secondary | ICD-10-CM

## 2016-05-06 ENCOUNTER — Other Ambulatory Visit (HOSPITAL_COMMUNITY): Payer: Self-pay

## 2016-05-06 DIAGNOSIS — F411 Generalized anxiety disorder: Secondary | ICD-10-CM

## 2016-05-06 DIAGNOSIS — F639 Impulse disorder, unspecified: Secondary | ICD-10-CM

## 2016-05-06 DIAGNOSIS — F331 Major depressive disorder, recurrent, moderate: Secondary | ICD-10-CM

## 2016-05-06 MED ORDER — OLANZAPINE 7.5 MG PO TABS: 7.5000 mg | ORAL_TABLET | Freq: Every day | ORAL | 1 refills | Status: DC

## 2016-05-06 MED ORDER — CLONAZEPAM 1 MG PO TABS: 1.0000 mg | ORAL_TABLET | Freq: Every day | ORAL | 1 refills | Status: DC | PRN

## 2016-05-06 MED ORDER — SERTRALINE HCL 100 MG PO TABS: 200.0000 mg | ORAL_TABLET | Freq: Every day | ORAL | 1 refills | Status: DC

## 2016-05-07 ENCOUNTER — Other Ambulatory Visit (HOSPITAL_COMMUNITY): Payer: Self-pay

## 2016-05-07 DIAGNOSIS — F639 Impulse disorder, unspecified: Secondary | ICD-10-CM

## 2016-05-07 MED ORDER — LAMOTRIGINE 150 MG PO TABS: 300.0000 mg | ORAL_TABLET | Freq: Every day | ORAL | 1 refills | Status: DC

## 2016-07-04 ENCOUNTER — Telehealth (HOSPITAL_COMMUNITY): Payer: Self-pay

## 2016-07-04 ENCOUNTER — Other Ambulatory Visit (HOSPITAL_COMMUNITY): Payer: Self-pay | Admitting: Psychiatry

## 2016-07-04 DIAGNOSIS — F331 Major depressive disorder, recurrent, moderate: Secondary | ICD-10-CM

## 2016-07-04 DIAGNOSIS — F411 Generalized anxiety disorder: Secondary | ICD-10-CM

## 2016-07-04 DIAGNOSIS — F639 Impulse disorder, unspecified: Secondary | ICD-10-CM

## 2016-07-04 MED ORDER — OLANZAPINE 7.5 MG PO TABS: 7.5000 mg | ORAL_TABLET | Freq: Every day | ORAL | 1 refills | Status: DC

## 2016-07-04 MED ORDER — LAMOTRIGINE 150 MG PO TABS: 300.0000 mg | ORAL_TABLET | Freq: Every day | ORAL | 1 refills | Status: DC

## 2016-07-04 MED ORDER — SERTRALINE HCL 100 MG PO TABS: 200.0000 mg | ORAL_TABLET | Freq: Every day | ORAL | 1 refills | Status: DC

## 2016-07-04 NOTE — Telephone Encounter (Signed)
Medication refill request - Telephone call with patient to inform her message was received requesting refills of her Lamicatal, Zoloft and Zyprexa.  Patient does not have enough until returns for eval on 07/18/16. Informed patient Dr. Michae Kava was out this week but would request from covering provider and contact her back if any problems getting new orders.

## 2016-07-04 NOTE — Telephone Encounter (Signed)
Called in refills for lamotrigine, sertraline and olanzapine with one additional refill to [facility] pharmacy

## 2016-07-10 ENCOUNTER — Ambulatory Visit (HOSPITAL_COMMUNITY): Payer: Self-pay | Admitting: Clinical

## 2016-07-18 ENCOUNTER — Ambulatory Visit (HOSPITAL_COMMUNITY): Payer: Self-pay | Admitting: Psychiatry

## 2016-09-05 ENCOUNTER — Encounter (HOSPITAL_COMMUNITY): Payer: Self-pay | Admitting: Psychiatry

## 2016-09-05 ENCOUNTER — Ambulatory Visit (INDEPENDENT_AMBULATORY_CARE_PROVIDER_SITE_OTHER): Payer: 59 | Admitting: Psychiatry

## 2016-09-05 VITALS — BP 104/62 | HR 89 | Ht 63.0 in | Wt 104.6 lb

## 2016-09-05 DIAGNOSIS — F331 Major depressive disorder, recurrent, moderate: Secondary | ICD-10-CM | POA: Diagnosis not present

## 2016-09-05 DIAGNOSIS — F411 Generalized anxiety disorder: Secondary | ICD-10-CM

## 2016-09-05 DIAGNOSIS — Z79899 Other long term (current) drug therapy: Secondary | ICD-10-CM

## 2016-09-05 DIAGNOSIS — F639 Impulse disorder, unspecified: Secondary | ICD-10-CM | POA: Diagnosis not present

## 2016-09-05 MED ORDER — SERTRALINE HCL 100 MG PO TABS: 200.0000 mg | ORAL_TABLET | Freq: Every day | ORAL | 1 refills | Status: DC

## 2016-09-05 MED ORDER — LAMOTRIGINE 150 MG PO TABS: 300.0000 mg | ORAL_TABLET | Freq: Every day | ORAL | 1 refills | Status: DC

## 2016-09-05 MED ORDER — HALOPERIDOL 1 MG PO TABS: 1.0000 mg | ORAL_TABLET | Freq: Every day | ORAL | 1 refills | Status: DC

## 2016-09-05 MED ORDER — OLANZAPINE 7.5 MG PO TABS: 7.5000 mg | ORAL_TABLET | Freq: Every day | ORAL | 1 refills | Status: DC

## 2016-09-05 NOTE — Progress Notes (Signed)
Patient ID: Karen Hahn, female   DOB: 25-Sep-1975, 41 y.o.   MRN: [id number]  [facility] [address] Progress Note  Karen Hahn [id number] 41 y.o.  09/05/2016 3:25 PM  Chief Complaint: "I am doing a little better"  History of Present Illness: reviewed information below with patient on 09/05/16 and same as previous visits except as noted  Pt states she was not not approved for any eating disorder treatment facility due to insurance.   Pt has missed several appointments due to lack of transportation.   Concentration remains poor. She is easily distracted and has poor memory.   Appetite is has improved. She is now eating 3 meals a day and 1-2 snacks a day. Pt is not checking her weight.  Depression continues but anxiety is a bigger stressors. She is focusing on her past. Denies crying spells, isolation and anhedonia.She is still not working and has no desire to do anything. Pt is not going out and spends her time doing nothing. Pt sits on the couch all day. Denies worthlessness and hopelessness.   Energy is poor. Sleeping poorly and getting about 3-4 hrs/night. She is waking multiple times a night.   Anxiety remains high. She has racing thoughts and focusing on the negative. She is picking at her skin when her face breaks out. It makes her feel bad about herself. States picking at her scabs gives her relief. No longer taking Klonopin because she doesn't want to get addicted to it.     Suicidal Ideation: No Plan Formed: No Patient has means to carry out plan: No  Homicidal Ideation: No Plan Formed: No Patient has means to carry out plan: No  Review of Systems: Psychiatric: Agitation: No Hallucination: No Depressed Mood: Yes Insomnia: Yes Hypersomnia: No Altered Concentration: Yes Feels Worthless: No Grandiose Ideas: No Belief In Special Powers: No New/Increased Substance Abuse: No Compulsions: Yes  Neurologic: Headache: No Seizure: No Paresthesias: No   Review  of Systems  Gastrointestinal: Positive for heartburn, nausea and vomiting. Negative for abdominal pain and diarrhea.  Musculoskeletal: Negative for back pain, falls, joint pain and neck pain.  Neurological: Negative for dizziness, tremors, sensory change, speech change, seizures, loss of consciousness and headaches.  Psychiatric/Behavioral: Positive for depression. Negative for hallucinations, substance abuse and suicidal ideas. The patient is nervous/anxious and has insomnia.      Past Medical, Family, Social History: reviewed information below with patient on 09/05/16 and same as previous visits except as noted Endorsing family hx of depression, ADD, alcohol abuse and anxiety disorder. Pt is living with her friend in [place], [place]. She is divorced and was married for 5 yrs. Pt has a GED. Currently unemployed and on disability. Pt smokes a few cigs a week. She smokes THC thru the day. Denies alcohol use.   Past Medical History:  Diagnosis Date  . ADD (attention deficit disorder)   . Anorexia nervosa without bulimia   . Chronic constipation   . COPD (chronic obstructive pulmonary disease) (HCC)   . GAD (generalized anxiety disorder)   . GERD (gastroesophageal reflux disease)   . History of gastric restrictive surgery   . History of gastric ulcer   . History of Helicobacter pylori infection    after 2003  . Impulse control disorder   . Moderate major depression (HCC)   . Osteopenia   . Post concussion syndrome    fell 10/20/2015  . SMAS (superior mesenteric artery syndrome) (HCC)   . Urge urinary incontinence  Outpatient Encounter Prescriptions as of 09/05/2016  Medication Sig  . albuterol (PROAIR HFA) 108 (90 BASE) MCG/ACT inhaler Inhale 2 puffs into the lungs every 6 (six) hours as needed for wheezing or shortness of breath.  Marland Kitchen ibuprofen (ADVIL,MOTRIN) 200 MG tablet Take 800 mg by mouth every 6 (six) hours as needed for moderate pain. Reported on 07/20/2015  . lamoTRIgine  (LAMICTAL) 150 MG tablet Take 2 tablets (300 mg total) by mouth daily.  Marland Kitchen OLANZapine (ZYPREXA) 7.5 MG tablet Take 1 tablet (7.5 mg total) by mouth at bedtime.  . promethazine (PHENERGAN) 25 MG tablet Take 25 mg by mouth every 6 (six) hours as needed for nausea or vomiting.  . sertraline (ZOLOFT) 100 MG tablet Take 2 tablets (200 mg total) by mouth daily.  . cephALEXin (KEFLEX) 500 MG capsule Take 1 capsule (500 mg total) by mouth 2 (two) times daily. (Patient not taking: Reported on 09/05/2016)  . Cholecalciferol (VITAMIN D3) 2000 units TABS Take 1 tablet by mouth daily.  . clonazePAM (KLONOPIN) 1 MG tablet Take 1 tablet (1 mg total) by mouth daily as needed for anxiety. (Patient not taking: Reported on 09/05/2016)  . dicyclomine (BENTYL) 10 MG capsule Take 10 mg by mouth 4 (four) times daily -  before meals and at bedtime.  . fluconazole (DIFLUCAN) 150 MG tablet Take 1 tablet (150 mg total) by mouth once. May repeat in 4 days (Patient not taking: Reported on 12/05/2015)  . lubiprostone (AMITIZA) 8 MCG capsule Take 8 mcg by mouth daily with breakfast.  . naproxen (NAPROSYN) 500 MG tablet Take 1 tablet (500 mg total) by mouth 2 (two) times daily as needed for mild pain, moderate pain or headache (TAKE WITH MEALS.). (Patient not taking: Reported on 09/05/2016)  . ondansetron (ZOFRAN ODT) 8 MG disintegrating tablet Take 1 tablet (8 mg total) by mouth every 8 (eight) hours as needed for nausea or vomiting. (Patient not taking: Reported on 09/05/2016)  . ondansetron (ZOFRAN) 8 MG tablet Take 8 mg by mouth 3 (three) times daily as needed for nausea or vomiting.   . pantoprazole (PROTONIX) 40 MG tablet Take 40 mg by mouth every morning.   . ranitidine (ZANTAC) 150 MG tablet Take 150 mg by mouth every morning. Reported on 12/05/2015  . traMADol (ULTRAM) 50 MG tablet Take 1 tablet (50 mg total) by mouth every 6 (six) hours as needed for moderate pain. (Patient not taking: Reported on 12/05/2015)   No facility-administered  encounter medications on file as of 09/05/2016.     Past Psychiatric History/Hospitalization(s): Anxiety: Yes Bipolar Disorder: No Depression: Yes Mania: No Psychosis: No Schizophrenia: No Personality Disorder: No Hospitalization for psychiatric illness: Yes numerous- 2 intensive eating program treatments in 2013 and 2014. BH eating d/o with Dr. Orland Dec Nov 2011. Multiple inpt hospitalizations at [facility] (2011, 2011, 2005), [place] (3x), [facility], [place], [place] (06/2013).  History of Electroconvulsive Shock Therapy: No Prior Suicide Attempts: No  Previous meds: Risperdal, Zyprexa, Seroquel, Paxil, Prozac, Remeron, Cymbalta  Physical Exam: Constitutional:  BP 104/62   Pulse 89   Ht 5\' 3"  (1.6 m)   Wt 104 lb 9.6 oz (47.4 kg)   BMI 18.53 kg/m   Vitals:   09/05/16 1511  Weight: 104 lb 9.6 oz (47.4 kg)  Height: 5\' 3"  (1.6 m)   Vitals:   09/05/16 1511  BP: 104/62  Pulse: 89     General Appearance: alert, oriented, no acute distress  Musculoskeletal: Strength & Muscle Tone: within normal limits Gait &  Station: normal Patient leans: straight  Mental Status Examination/Evaluation: reviewed MSE on 09/05/16 and same as previous visits except as noted  Objective: Attitude: Calm and cooperative  Appearance: dishelved, thin. visible scars and scabs all over face. Appears older than stated age.   Eye Contact::  Good  Speech:  Clear and Coherent and Normal Rate  Volume:  Normal  Mood:  Depressed and anxious  Affect:  Congruent  Thought Process:  Goal Directed  Orientation:  Full (Time, Place, and Person)  Thought Content:  Negative  Suicidal Thoughts:  No  Homicidal Thoughts:  No  Judgement:  Fair  Insight:  Fair  Concentration: good  Memory: Immediate-fair  Recent-fair Remote-fair  Recall: fair  Language: fair  Gait and Station: normal  [employer] of Knowledge: average  Psychomotor Activity:  Normal  Akathisia:  No  Handed:  Right  AIMS (if indicated):   Facial and Oral Movements  Muscles of Facial Expression: None, normal  Lips and Perioral Area: None, normal  Jaw: None, normal  Tongue: None, normal Extremity Movements: Upper (arms, wrists, hands, fingers): None, normal  Lower (legs, knees, ankles, toes): None, normal,  Trunk Movements:  Neck, shoulders, hips: None, normal,  Overall Severity : Severity of abnormal movements (highest score from questions above): None, normal  Incapacitation due to abnormal movements: None, normal  Patient's awareness of abnormal movements (rate only patient's report): No Awareness, Dental Status  Current problems with teeth and/or dentures?: No  Does patient usually wear dentures?: No    Assets:  Communication Skills Desire for Improvement Financial Resources/Insurance Housing Social Support Talents/Skills Transportation       Reviewed A&P on 09/05/16 and same as previous visits except as noted   Assessment: AXIS I  Axis I: Impulse control disorder, Anorexia Nervosa, Cannabis dependence, MDD- moderate, recurrent,  GAD, ADD- inattentive type, Insomnia  AXIS II  Deferred       Treatment Plan/Recommendations:  Plan of Care:   Medication management with supportive therapy. Risks/benefits and SE of the medication discussed. Pt verbalized understanding and verbal consent obtained for treatment.  Affirm with the patient that the medications are taken as ordered. Patient expressed understanding of how their medications were to be used.  -worsening of symptoms    Laboratory: reviewed labs glu 142,, WBC elevated, urine preg neg  EKG Qtc 443, sinus rhythm  Ordered EKG  Psychotherapy:Therapy: brief supportive therapy provided. Discussed psychosocial stressors in detail.      Medications:   -Continue Lamictal 300mg  for mood stabilization -Zyprexa 7.5mg  po qHS for mood stabalization, adjunct for depression and anxiety.  - Zoloft 200mg  po qD for mood and anxiety -start trial of Haldol 1mg  po  qHS for anxiety/rumination - avoid stimulants if possible due to appetite suppression Anxiety has been well controlled despite treatment with dual antidepressants, anxiolytics. Will attempt treatment with 2 antipsychotics. Pt is aware of the risks and has verbalized agreement to treatment. Benefit outweighs the risk.  - Daytrana patch as it was not approved by PCP  Routine PRN Medications: No  Consultations: pt has appt with Tomma Lightning later this month  Safety Concerns: Pt denies SI and is at an acute low risk for suicide.Patient told to call clinic if any problems occur. Patient advised to go to ER  if she should develop SI/HI, side effects, or if symptoms worsen. Has crisis numbers to call if needed. Pt verbalized understanding.   Other: F/up in 6 weeks or sooner if needed      Oletta Darter,  MD 09/05/2016

## 2016-09-14 IMAGING — CT CT CERVICAL SPINE W/O CM
3 of 6 series · 11 of 33 positions shown, 13 images · non-contrast
Comparison: 04/27/14

CLINICAL DATA: headache, nausea, neck pain and vomiting. Patient
had bladder stents put in 3 days ago, her pain has been increasing.
Patient fell on [HOSPITAL] days ago, it was a witnessed fall. She
denies LOC or hitting head.

EXAM:
CT HEAD WITHOUT CONTRAST
CT CERVICAL SPINE WITHOUT CONTRAST
TECHNIQUE: Multidetector CT imaging of the head and cervical spine was
performed following the standard protocol without intravenous
contrast. Multiplanar CT image reconstructions of the cervical spine
were also generated.

[Series 8: axial recon · axial · 0.23mm/px · z∈[-276,-164]mm · 3 of 102 slices shown, 4 images]
[im 21/102  soft-tissue]
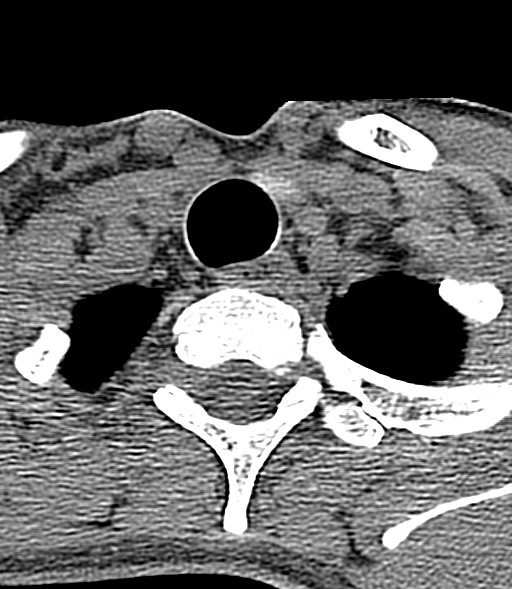
[im 21/102  bone]
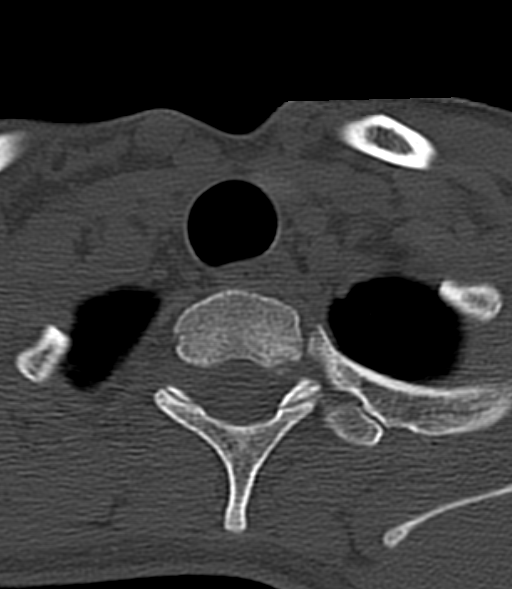
[im 61/102  bone]
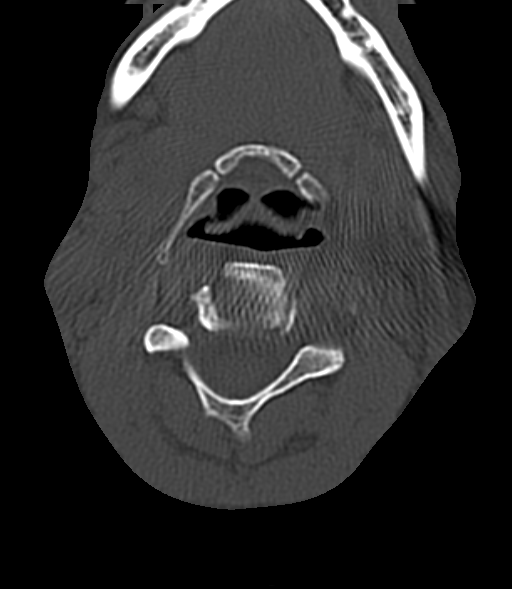
[im 81/102  bone]
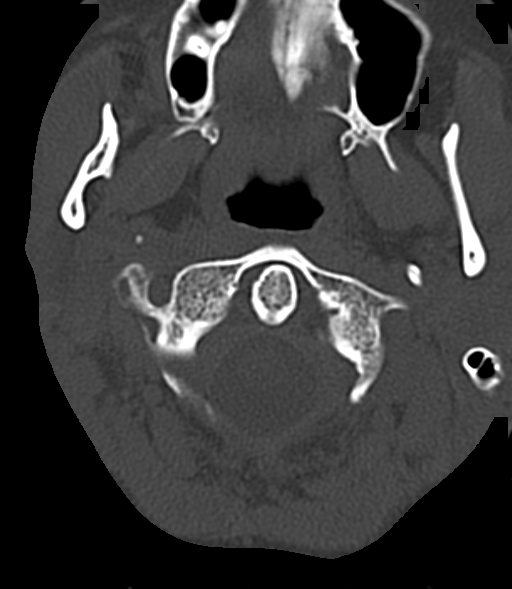

[Series 9: coronal · coronal · 0.23mm/px · 3 of 61 slices shown]
[im 13/61  bone]
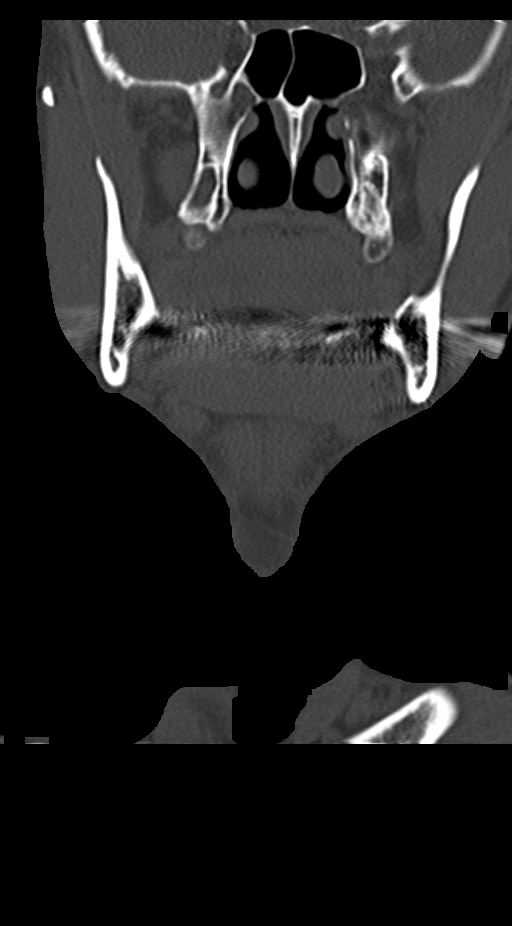
[im 25/61  bone]
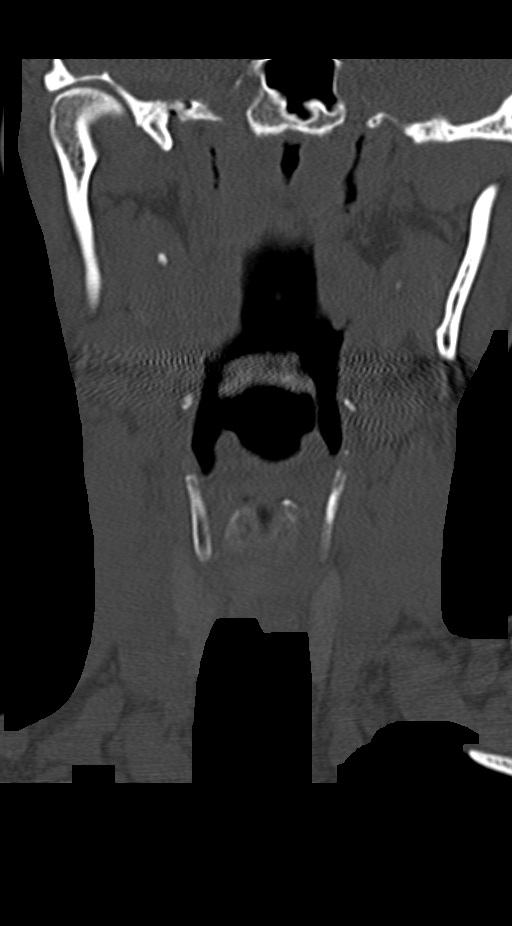
[im 37/61  bone]
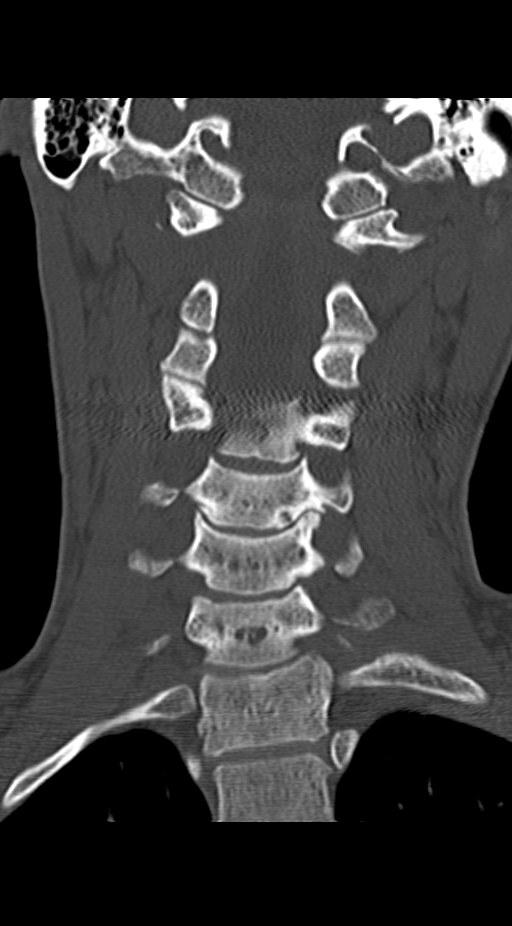

[Series 10: sagittal · sagittal · 0.23mm/px · 5 of 61 slices shown, 6 images]
[im 21/61  bone]
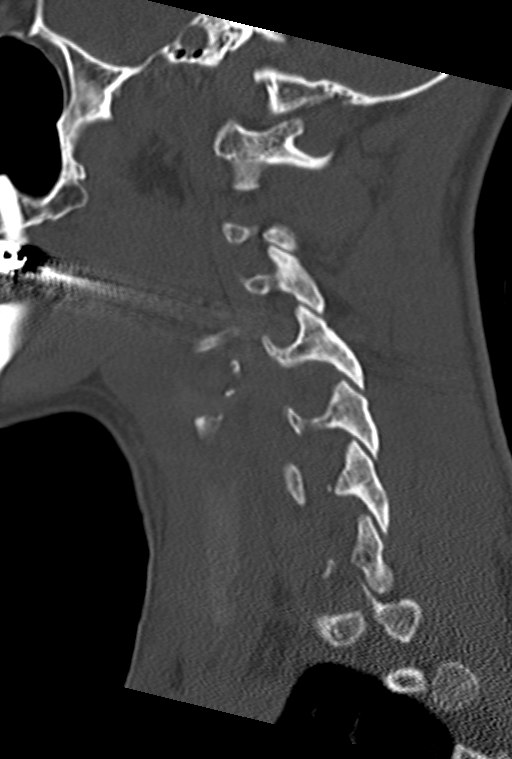
[im 26/61  bone]
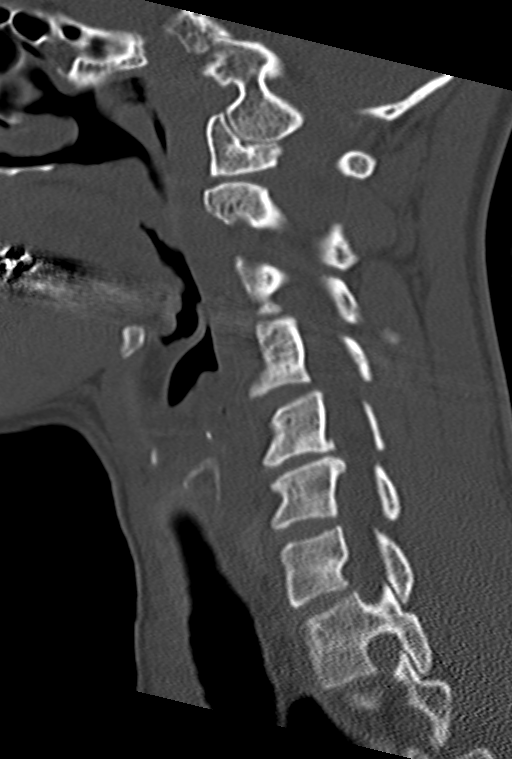
[im 31/61  soft-tissue]
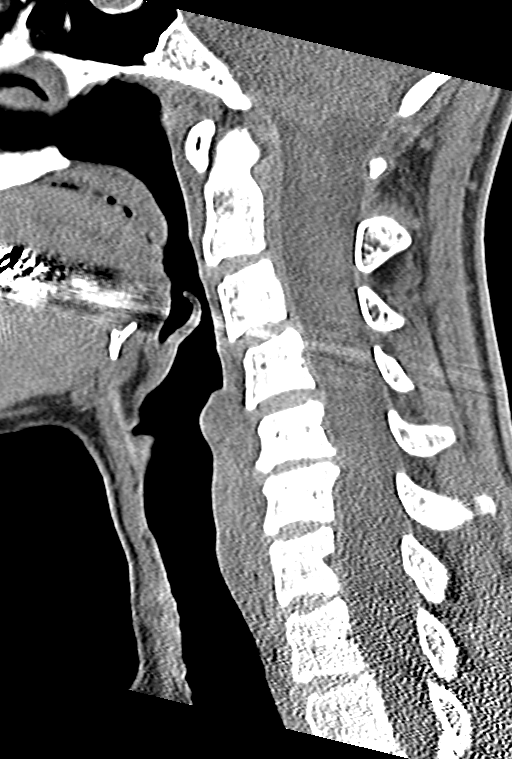
[im 31/61  bone]
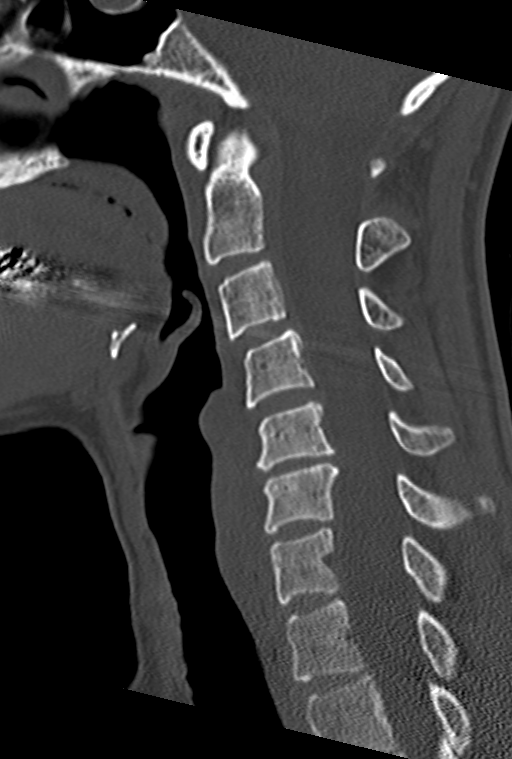
[im 36/61  bone]
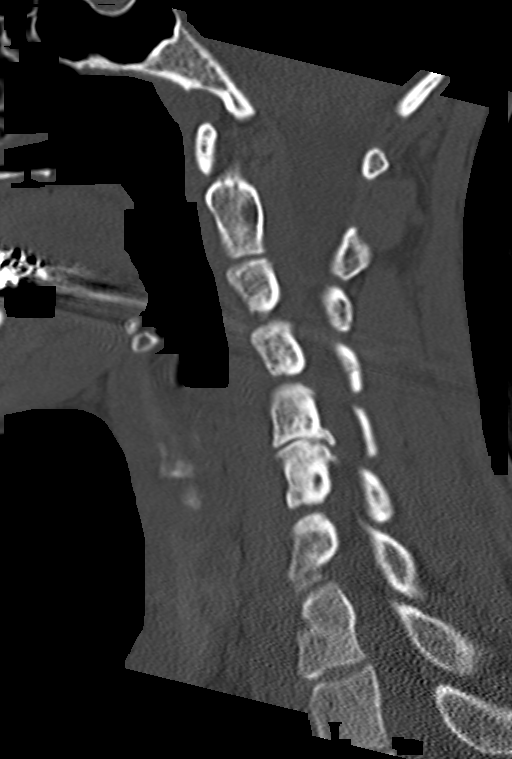
[im 41/61  bone]
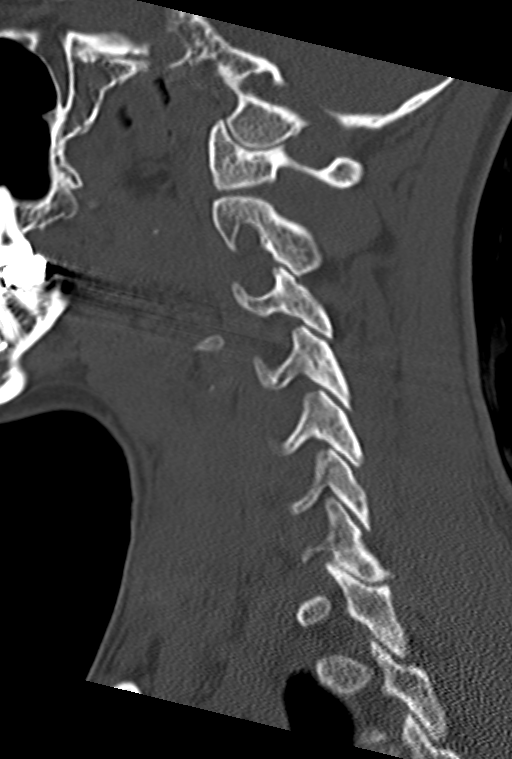

[11 of 33 positions shown; findings below may reference images not displayed]

FINDINGS: CT HEAD FINDINGS

No mass lesion. No midline shift. No acute hemorrhage or hematoma.
No extra-axial fluid collections. No evidence of acute infarction.No
skull fracture.

CT CERVICAL SPINE FINDINGS

Normal anterior-posterior alignment. No acute soft tissue
abnormalities. Thyroid is normal. Lung apices are normal.

No cervical spine fracture. Reversed lordosis appears to be due to
moderate C5-6 degenerative disc disease. Disc osteophytic bulge
causes mild canal narrowing.
IMPRESSION: No acute intracranial abnormalities.

No evidence of cervical spine fracture.

## 2016-09-16 ENCOUNTER — Telehealth (HOSPITAL_COMMUNITY): Payer: Self-pay

## 2016-09-16 NOTE — Telephone Encounter (Signed)
Patients roommate is calling, she is concerned that the patient is abusing her medication. She said that patient told her that the Sertraline had been reduced from 200 mg a day, to 100 mg a day. Roommate keeps patients medication in her room locked up, but patient found a way in and was taking extra Sertraline. I told the roommate ( she is on HIPAA information) that I did not see a decrease in the Sertraline and it was not in your note. I said that you did not re prescribe the Klonopin, but there was no change in the other. Patients roommate was thinking patient is getting high off this medication and I did explain that if she was taking 2 a day instead of 1 - that she would not be getting high. Roommate is very concerned and would like you to call her. Her c/b number is [phone] and her name is Barkley Bruns. Thank you

## 2016-09-17 NOTE — Telephone Encounter (Signed)
Spoke with Karen Hahn regarding some concerns that she has. Karen Hahn is keeping all pt's meds locked up. States pt went into med locked cabinet an unknown number of time and took 6+ Sertraline. Pt told Karen Hahn that it made her feel high but not euphoric. Karen Hahn no longer wants responsibility of the meds and gave everything back to the pt. They had a huge argument and Karen Hahn told pt to move out unless she gets into rehab. They looked at insurance options and found they do not have the money. In her 89's pt abused cocaine. She has also abused Xanax and Klonopin. Karen Hahn does not think pt is abusing any other drugs or meds at this time. States pt is looking into [facility].   A/P: medication misuse Scheduled appt with pt to discuss issues.

## 2016-09-24 ENCOUNTER — Encounter (HOSPITAL_COMMUNITY): Payer: Self-pay | Admitting: Psychiatry

## 2016-09-24 ENCOUNTER — Ambulatory Visit (INDEPENDENT_AMBULATORY_CARE_PROVIDER_SITE_OTHER): Payer: 59 | Admitting: Psychiatry

## 2016-09-24 VITALS — BP 108/64 | HR 85 | Ht 63.0 in | Wt 102.8 lb

## 2016-09-24 DIAGNOSIS — F5000 Anorexia nervosa, unspecified: Secondary | ICD-10-CM | POA: Diagnosis not present

## 2016-09-24 DIAGNOSIS — F411 Generalized anxiety disorder: Secondary | ICD-10-CM | POA: Diagnosis not present

## 2016-09-24 DIAGNOSIS — F3130 Bipolar disorder, current episode depressed, mild or moderate severity, unspecified: Secondary | ICD-10-CM

## 2016-09-24 DIAGNOSIS — F1220 Cannabis dependence, uncomplicated: Secondary | ICD-10-CM

## 2016-09-24 DIAGNOSIS — G4700 Insomnia, unspecified: Secondary | ICD-10-CM

## 2016-09-24 DIAGNOSIS — F331 Major depressive disorder, recurrent, moderate: Secondary | ICD-10-CM

## 2016-09-24 DIAGNOSIS — F639 Impulse disorder, unspecified: Secondary | ICD-10-CM | POA: Diagnosis not present

## 2016-09-24 DIAGNOSIS — T43221S Poisoning by selective serotonin reuptake inhibitors, accidental (unintentional), sequela: Secondary | ICD-10-CM

## 2016-09-24 DIAGNOSIS — F900 Attention-deficit hyperactivity disorder, predominantly inattentive type: Secondary | ICD-10-CM

## 2016-09-24 NOTE — Progress Notes (Signed)
Patient ID: Karen Hahn, female   DOB: 1976/04/24, 41 y.o.   MRN: [id number]  [facility] [id number] Progress Note  Coty Bauknecht [id number] 41 y.o.  09/24/2016 8:57 AM  Chief Complaint: "I stopped taking everything but Lamictal one week ago"  History of Present Illness: reviewed information below with patient on 09/24/16 and same as previous visits except as noted  Pt here with roommate.  Pt states roommate was hold all pt's meds. Pt was overtaking Zoloft- anywhere from 2-6 tabs a couple of times a day. States it would cause her to feel high for the whole day and sometimes into the next day. It would make her feel "on top of the world and more motivated".  One week ago she was caught by her roommate so she stopped all meds except Lamictal. Pt is now going to [facility]. States she doesn't trust herself with any med except Lamictal. States Lamictal helps her and she has never abused it.   Roommate states that she notices on some days the patient was more motivated and happier. She thought the pt was improving and was acting more like her old self. She began to suspect something was off when the pt's behavior became "over the top". She confronted the patient after counting the pills and finding 30 pills lost. Roommate told pt to get help or get out. Roommate states pt has a chronic problem with abusing meds in the past. Roommate is no longer managing pt's meds. Roommate flushed away Zoloft. Pt has her other meds but states she will dispose of them.   She is now eating 3 meals a day and 1-2 snacks a day. Pt is not checking her weight.  Depression continues.  Denies crying spells, isolation and anhedonia.She is still not working and has no desire to do anything. Pt is not going out and spends her time doing nothing. Pt sits on the couch all day. Denies worthlessness and hopelessness.   Energy is poor. Sleeping poorly and getting about 3-4 hrs/night. She is waking multiple times a  night.   Anxiety remains high. She has racing thoughts and focusing on the negative. She is picking at her skin when her face breaks out. It makes her feel bad about herself but she can not stop. States picking at her scabs gives her relief.     Suicidal Ideation: No Plan Formed: No Patient has means to carry out plan: No  Homicidal Ideation: No Plan Formed: No Patient has means to carry out plan: No  Review of Systems: Psychiatric: Agitation: No Hallucination: No Depressed Mood: Yes Insomnia: Yes Hypersomnia: No Altered Concentration: Yes Feels Worthless: No Grandiose Ideas: No Belief In Special Powers: No New/Increased Substance Abuse: No Compulsions: Yes  Neurologic: Headache: No Seizure: No Paresthesias: No   Review of Systems  Constitutional: Negative for chills and fever.  HENT: Negative for congestion, ear pain, nosebleeds, sinus pain and sore throat.   Gastrointestinal: Positive for heartburn, nausea and vomiting. Negative for abdominal pain and diarrhea.  Neurological: Negative for dizziness, tremors, sensory change, speech change, seizures, loss of consciousness, weakness and headaches.  Psychiatric/Behavioral: Positive for depression and substance abuse. Negative for hallucinations and suicidal ideas. The patient is nervous/anxious and has insomnia.      Past Medical, Family, Social History: reviewed information below with patient on 09/24/16 and same as previous visits except as noted Endorsing family hx of depression, ADD, alcohol abuse and anxiety disorder. Pt is living with her friend in [place],  [place]. She is divorced and was married for 5 yrs. Pt has a GED. Currently unemployed and on disability. Pt smokes a few cigs a week. She smokes THC thru the day. Denies alcohol use.   Past Medical History:  Diagnosis Date  . ADD (attention deficit disorder)   . Anorexia nervosa without bulimia   . Chronic constipation   . COPD (chronic obstructive pulmonary  disease) (HCC)   . GAD (generalized anxiety disorder)   . GERD (gastroesophageal reflux disease)   . History of gastric restrictive surgery   . History of gastric ulcer   . History of Helicobacter pylori infection    after 2003  . Impulse control disorder   . Moderate major depression (HCC)   . Osteopenia   . Post concussion syndrome    fell 10/20/2015  . SMAS (superior mesenteric artery syndrome) (HCC)   . Urge urinary incontinence     Outpatient Encounter Prescriptions as of 09/24/2016  Medication Sig  . albuterol (PROAIR HFA) 108 (90 BASE) MCG/ACT inhaler Inhale 2 puffs into the lungs every 6 (six) hours as needed for wheezing or shortness of breath.  . lamoTRIgine (LAMICTAL) 150 MG tablet Take 2 tablets (300 mg total) by mouth daily.  . promethazine (PHENERGAN) 25 MG tablet Take 25 mg by mouth every 6 (six) hours as needed for nausea or vomiting.  . cephALEXin (KEFLEX) 500 MG capsule Take 1 capsule (500 mg total) by mouth 2 (two) times daily. (Patient not taking: Reported on 09/05/2016)  . Cholecalciferol (VITAMIN D3) 2000 units TABS Take 1 tablet by mouth daily.  Marland Kitchen dicyclomine (BENTYL) 10 MG capsule Take 10 mg by mouth 4 (four) times daily -  before meals and at bedtime.  . fluconazole (DIFLUCAN) 150 MG tablet Take 1 tablet (150 mg total) by mouth once. May repeat in 4 days (Patient not taking: Reported on 12/05/2015)  . haloperidol (HALDOL) 1 MG tablet Take 1 tablet (1 mg total) by mouth daily. (Patient not taking: Reported on 09/24/2016)  . ibuprofen (ADVIL,MOTRIN) 200 MG tablet Take 800 mg by mouth every 6 (six) hours as needed for moderate pain. Reported on 07/20/2015  . lubiprostone (AMITIZA) 8 MCG capsule Take 8 mcg by mouth daily with breakfast.  . naproxen (NAPROSYN) 500 MG tablet Take 1 tablet (500 mg total) by mouth 2 (two) times daily as needed for mild pain, moderate pain or headache (TAKE WITH MEALS.). (Patient not taking: Reported on 09/05/2016)  . OLANZapine (ZYPREXA) 7.5 MG  tablet Take 1 tablet (7.5 mg total) by mouth at bedtime. (Patient not taking: Reported on 09/24/2016)  . ondansetron (ZOFRAN ODT) 8 MG disintegrating tablet Take 1 tablet (8 mg total) by mouth every 8 (eight) hours as needed for nausea or vomiting. (Patient not taking: Reported on 09/05/2016)  . ondansetron (ZOFRAN) 8 MG tablet Take 8 mg by mouth 3 (three) times daily as needed for nausea or vomiting.   . pantoprazole (PROTONIX) 40 MG tablet Take 40 mg by mouth every morning.   . ranitidine (ZANTAC) 150 MG tablet Take 150 mg by mouth every morning. Reported on 12/05/2015  . sertraline (ZOLOFT) 100 MG tablet Take 2 tablets (200 mg total) by mouth daily. (Patient not taking: Reported on 09/24/2016)  . traMADol (ULTRAM) 50 MG tablet Take 1 tablet (50 mg total) by mouth every 6 (six) hours as needed for moderate pain. (Patient not taking: Reported on 12/05/2015)   No facility-administered encounter medications on file as of 09/24/2016.     Past  Psychiatric History/Hospitalization(s): Anxiety: Yes Bipolar Disorder: No Depression: Yes Mania: No Psychosis: No Schizophrenia: No Personality Disorder: No Hospitalization for psychiatric illness: Yes numerous- 2 intensive eating program treatments in 2013 and 2014. BH eating d/o with Dr. Orland Dec Nov 2011. Multiple inpt hospitalizations at [facility] (2011, 2011, 2005), [facility] (3x), [facility], [place], [facility] (06/2013).  History of Electroconvulsive Shock Therapy: No Prior Suicide Attempts: No  Previous meds: Risperdal, Zyprexa, Seroquel, Paxil, Prozac, Remeron, Cymbalta  Physical Exam: Constitutional:  BP 108/64   Pulse 85   Ht 5\' 3"  (1.6 m)   Wt 102 lb 12.8 oz (46.6 kg)   BMI 18.21 kg/m   Vitals:   09/24/16 0852  Weight: 102 lb 12.8 oz (46.6 kg)  Height: 5\' 3"  (1.6 m)   Vitals:   09/24/16 0852  BP: 108/64  Pulse: 85     General Appearance: alert, oriented, no acute distress  Musculoskeletal: Strength & Muscle Tone: within normal  limits Gait & Station: normal Patient leans: straight  Mental Status Examination/Evaluation: reviewed MSE on 09/24/16 and same as previous visits except as noted  Objective: Attitude: Calm and cooperative  Appearance: dishelved, thin. visible scars and scabs all over face. Appears older than stated age.   Eye Contact::  Good  Speech:  Clear and Coherent and Normal Rate  Volume:  Normal  Mood:  Depressed and anxious  Affect:  Congruent  Thought Process:  Goal Directed  Orientation:  Full (Time, Place, and Person)  Thought Content:  Negative  Suicidal Thoughts:  No  Homicidal Thoughts:  No  Judgement:  Fair  Insight:  Fair  Concentration: good  Memory: Immediate-fair  Recent-fair Remote-fair  Recall: fair  Language: fair  Gait and Station: normal  [employer] of Knowledge: average  Psychomotor Activity:  Normal  Akathisia:  No  Handed:  Right  AIMS (if indicated):  Facial and Oral Movements  Muscles of Facial Expression: None, normal  Lips and Perioral Area: None, normal  Jaw: None, normal  Tongue: None, normal Extremity Movements: Upper (arms, wrists, hands, fingers): None, normal  Lower (legs, knees, ankles, toes): None, normal,  Trunk Movements:  Neck, shoulders, hips: None, normal,  Overall Severity : Severity of abnormal movements (highest score from questions above): None, normal  Incapacitation due to abnormal movements: None, normal  Patient's awareness of abnormal movements (rate only patient's report): No Awareness, Dental Status  Current problems with teeth and/or dentures?: No  Does patient usually wear dentures?: No    Assets:  Communication Skills Desire for Improvement Financial Resources/Insurance Housing Social Support Talents/Skills Transportation       Reviewed A&P on 09/24/16 and same as previous visits except as noted   Assessment: AXIS I  Axis I: Impulse control disorder, Anorexia Nervosa, Cannabis dependence, MDD- moderate,  recurrent,  GAD, ADD- inattentive type, Insomnia  AXIS II  Deferred       Treatment Plan/Recommendations:  Plan of Care:   Medication management with supportive therapy. Risks/benefits and SE of the medication discussed. Pt verbalized understanding and verbal consent obtained for treatment.  Affirm with the patient that the medications are taken as ordered. Patient expressed understanding of how their medications were to be used.  -worsening of symptoms    Laboratory: reviewed labs glu 142,, WBC elevated, urine preg neg  EKG Qtc 443, sinus rhythm  Ordered EKG  Psychotherapy:Therapy: brief supportive therapy provided. Discussed psychosocial stressors in detail.      Medications:   -Continue Lamictal 300mg  for mood stabilization - d.c  Zyprexa   - d/c Zoloft due to abuse  - d/cHaldol  - avoid stimulants if possible due to appetite suppression - Daytrana patch as it was not approved by PCP  Routine PRN Medications: No  Consultations: pt has appt with Tomma Lightning later this month; encouraged to continue NA groups  Safety Concerns: Pt denies SI and is at an acute low risk for suicide.Patient told to call clinic if any problems occur. Patient advised to go to ER  if she should develop SI/HI, side effects, or if symptoms worsen. Has crisis numbers to call if needed. Pt verbalized understanding.   Other: F/up in 3-4 weeks or sooner if needed      Oletta Darter, MD 09/24/2016

## 2016-09-30 ENCOUNTER — Ambulatory Visit (HOSPITAL_COMMUNITY): Payer: Self-pay | Admitting: Clinical

## 2016-10-01 ENCOUNTER — Ambulatory Visit (INDEPENDENT_AMBULATORY_CARE_PROVIDER_SITE_OTHER): Payer: 59 | Admitting: Clinical

## 2016-10-01 ENCOUNTER — Encounter (HOSPITAL_COMMUNITY): Payer: Self-pay | Admitting: Clinical

## 2016-10-01 DIAGNOSIS — F3130 Bipolar disorder, current episode depressed, mild or moderate severity, unspecified: Secondary | ICD-10-CM | POA: Diagnosis not present

## 2016-10-01 DIAGNOSIS — F639 Impulse disorder, unspecified: Secondary | ICD-10-CM | POA: Diagnosis not present

## 2016-10-01 DIAGNOSIS — F5000 Anorexia nervosa, unspecified: Secondary | ICD-10-CM
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
## 2016-10-07 ENCOUNTER — Ambulatory Visit (INDEPENDENT_AMBULATORY_CARE_PROVIDER_SITE_OTHER): Payer: 59 | Admitting: Clinical

## 2016-10-07 DIAGNOSIS — F5000 Anorexia nervosa, unspecified: Secondary | ICD-10-CM

## 2016-10-07 DIAGNOSIS — F3130 Bipolar disorder, current episode depressed, mild or moderate severity, unspecified: Secondary | ICD-10-CM

## 2016-10-07 DIAGNOSIS — F639 Impulse disorder, unspecified: Secondary | ICD-10-CM

## 2016-10-08 ENCOUNTER — Encounter (HOSPITAL_COMMUNITY): Payer: Self-pay | Admitting: Clinical

## 2016-10-08 NOTE — Progress Notes (Signed)
   THERAPIST PROGRESS NOTE  Session Time: 1:30 -2:25  Participation Level: Active  Behavioral Response: CasualAlertAnxious   Type of Therapy: Individual Therapy  Treatment Goals addressed: improve psychiatric symptoms,  improve unhelpful thought patterns, discuss and process past trauma, emotional regulation skills , healthy coping skills   Interventions: cbt, motivational interviewing, psychoeducation, grounding and mindfulness techniques,   Summary: Karen Hahn is a 41 y.o. female who presents with Bipolar I disorder and Impulse control disorder, and Anorexia Nervosa  Suicidal/Homicidal: No, without intent/plan  Therapist Response: Karen Hahn met with clinician for an individual therapy session she discussed her psychiatric symptoms, her current life events and her goals for therapy. Client and clinician reviewed and discussed her diagnosis. Clinician explained to her that she thought there was a possibility that she may have borderline personality disorder because of the combination of symptoms. Clinician gave her the name of providers that provide DBT. Client and clinician went ahead and completed a treatment plan so that she could have the care she needs. She stated she would check out the resource numbers provided. Clinician asked open ended questions and Karen Hahn stated that she was doing the same emotionally as she was at her assessment. She stated that she has not self harmed and did not have any current suicidal ideations. She shared that after she had her assessment she had nightmares about her past traumas. Clinician introduced grounding and mindfulness techniques. Clinician explained the process, the purpose and the practice of the techniques. Client and clinician practiced the techniques together. Clinician explained some basic cbt concepts. Clinician asked open ended questions about the negative thoughts Karen Hahn formed because of the trauma. Karen Hahn picked one, She identified and  rated her emotions, She then identified the evidence for and against the negative thought. Client and clinician reviewed the process then Karen Hahn formulated a healthier alternative thought. Clinician gave Karen Hahn a homework packet on Bipolar. She Agreed to complete it before next session. She agreed to practice her grounding and mindfulness techniques daily until next session  Plan: Return in 1-2 weeks.  Diagnosis: Axis I: Bipolar I disorder and Impulse control disorder, and Anorexia Nervosa    Cason Luffman A, LCSW 10/08/2016

## 2016-10-22 ENCOUNTER — Ambulatory Visit (INDEPENDENT_AMBULATORY_CARE_PROVIDER_SITE_OTHER): Payer: 59 | Admitting: Clinical

## 2016-10-22 DIAGNOSIS — F3130 Bipolar disorder, current episode depressed, mild or moderate severity, unspecified: Secondary | ICD-10-CM

## 2016-10-22 NOTE — Progress Notes (Signed)
   THERAPIST PROGRESS NOTE  Session Time: 11:05 -12:00  Participation Level: Active  Behavioral Response: CasualAlertAnxious  Type of Therapy: Individual Therapy  Treatment Goals addressed: improve psychiatric symptoms, elevate mood, improve unhelpful thought patterns,  emotional regulation skills (decreased impulsive behaviors, decreased anger), learn about diagnosis, healthy coping skills   Interventions: cbt, motivational interviewing, psychoeducation,    Summary: Karen Hahn is a 41 y.o. female who presents with Bipolar I disorder and Impulse control disorder, and Anorexia Nervosa  Suicidal/Homicidal: No, without intent/plan  Therapist Response: Karen Hahn met with clinician for an individual therapy session she discussed her psychiatric symptoms, her current life events and her homework. Karen Hahn shared that she was doing okay. She shared that she had completed her homework packet on Bipolar . Client and clinician discussed her diagnosis and symptoms related to it. Karen Hahn shared her thoughts and insights from the homework. Karen Hahn shared that she had been experiencing anger. She shared about an instance "which wrecked her whole day" She shared she was very angry, Client and clinician used a 7 panel thought record sheet. Clinician asked open ended questions and Karen Hahn identified and rated her negative emotions. She then identified her negative thoughts. Clinician asked open ended questions and she identified the evidence for and against. She was then able to formulate healthier alternative thoughts. Client and clinician discussed the process and how her actions might change if she believed the healthier alternative thoughts. She shared her insight about drama and her draw to it. Client and clinician started a conversation about the cost and benefits. Client and clinician agreed to discuss this further at future sessions.   Plan: Return in 1-2 weeks.  Diagnosis: Axis I: Bipolar I disorder  and Impulse control disorder, and Anorexia Nervosa   Bakari Nikolai A, LCSW 10/22/2016

## 2016-10-24 ENCOUNTER — Ambulatory Visit (INDEPENDENT_AMBULATORY_CARE_PROVIDER_SITE_OTHER): Payer: 59 | Admitting: Psychiatry

## 2016-10-24 ENCOUNTER — Encounter (HOSPITAL_COMMUNITY): Payer: Self-pay | Admitting: Psychiatry

## 2016-10-24 ENCOUNTER — Telehealth (HOSPITAL_COMMUNITY): Payer: Self-pay | Admitting: Psychiatry

## 2016-10-24 VITALS — BP 110/68 | HR 98 | Ht 63.0 in | Wt 100.4 lb

## 2016-10-24 DIAGNOSIS — F900 Attention-deficit hyperactivity disorder, predominantly inattentive type: Secondary | ICD-10-CM | POA: Diagnosis not present

## 2016-10-24 DIAGNOSIS — Z79899 Other long term (current) drug therapy: Secondary | ICD-10-CM

## 2016-10-24 DIAGNOSIS — F332 Major depressive disorder, recurrent severe without psychotic features: Secondary | ICD-10-CM | POA: Diagnosis not present

## 2016-10-24 DIAGNOSIS — Z813 Family history of other psychoactive substance abuse and dependence: Secondary | ICD-10-CM | POA: Diagnosis not present

## 2016-10-24 DIAGNOSIS — Z818 Family history of other mental and behavioral disorders: Secondary | ICD-10-CM

## 2016-10-24 DIAGNOSIS — F639 Impulse disorder, unspecified: Secondary | ICD-10-CM | POA: Diagnosis not present

## 2016-10-24 DIAGNOSIS — Z888 Allergy status to other drugs, medicaments and biological substances status: Secondary | ICD-10-CM

## 2016-10-24 DIAGNOSIS — F17210 Nicotine dependence, cigarettes, uncomplicated: Secondary | ICD-10-CM

## 2016-10-24 DIAGNOSIS — F411 Generalized anxiety disorder: Secondary | ICD-10-CM | POA: Diagnosis not present

## 2016-10-24 DIAGNOSIS — G4700 Insomnia, unspecified: Secondary | ICD-10-CM

## 2016-10-24 DIAGNOSIS — F5000 Anorexia nervosa, unspecified: Secondary | ICD-10-CM | POA: Diagnosis not present

## 2016-10-24 DIAGNOSIS — Z880 Allergy status to penicillin: Secondary | ICD-10-CM

## 2016-10-24 DIAGNOSIS — Z811 Family history of alcohol abuse and dependence: Secondary | ICD-10-CM

## 2016-10-24 MED ORDER — SELEGILINE 6 MG/24HR TD PT24: 6.0000 mg | MEDICATED_PATCH | Freq: Every day | TRANSDERMAL | 1 refills | Status: DC

## 2016-10-24 MED ORDER — LAMOTRIGINE 150 MG PO TABS: 300.0000 mg | ORAL_TABLET | Freq: Every day | ORAL | 1 refills | Status: DC

## 2016-10-24 NOTE — Progress Notes (Signed)
BH MD/PA/NP OP Progress Note  10/24/2016 1:35 PM Karen Hahn  MRN:  [id number]  Chief Complaint:  Chief Complaint    Follow-up      HPI: Things are a little better at home. Pt goes to NA meetings daily. She has a sponsor who she speaks to daily. Pt denies any illicit pill use. Pt threw away all the pills except for Lamictal and Phenergan. Pt reports she has cravings. She continues to smoke THC daily. Pt is working with a therapist on weekly basis.  Depression is present. Pt is irritable and very low frustration tolerance. Pt's mother is calling every couple of days despite telling her mother to not call. It is making pt feel very frustrated. Denies SIB. Denies SI/HI.  Denies manic and hypomanic symptoms including periods of decreased need for sleep, increased energy, mood lability, impulsivity, FOI, and excessive spending.  She is not picking at her skin recently.  Sleep is ok and she is getting about 5-6 hrs/night.   Anxiety is elevated. She is always anxious.   Pt is eating 3 meals a day. States she is not snacking.   Taking meds as prescribed and denies SE.     Visit Diagnosis:    ICD-9-CM ICD-10-CM   1. Severe episode of recurrent major depressive disorder, without psychotic features (HCC) 296.33 F33.2 selegiline (EMSAM) 6 MG/24HR     lamoTRIgine (LAMICTAL) 150 MG tablet  2. Impulse control disorder 312.30 F63.9 lamoTRIgine (LAMICTAL) 150 MG tablet  3. GAD (generalized anxiety disorder) 300.02 F41.1 selegiline (EMSAM) 6 MG/24HR  4. Anorexia nervosa 307.1 F50.00     Past Psychiatric History: see H&P  Past Medical History:  Past Medical History:  Diagnosis Date  . ADD (attention deficit disorder)   . Anorexia nervosa without bulimia   . Chronic constipation   . COPD (chronic obstructive pulmonary disease) (HCC)   . GAD (generalized anxiety disorder)   . GERD (gastroesophageal reflux disease)   . History of gastric restrictive surgery   . History of gastric ulcer    . History of Helicobacter pylori infection    after 2003  . Impulse control disorder   . Moderate major depression (HCC)   . Osteopenia   . Post concussion syndrome    fell 10/20/2015  . SMAS (superior mesenteric artery syndrome) (HCC)   . Urge urinary incontinence     Past Surgical History:  Procedure Laterality Date  . DILATION AND CURETTAGE OF UTERUS  2005  . INTERSTIM IMPLANT PLACEMENT N/A 10/18/2015   Procedure: [name] IMPLANT FIRST STAGE;  Surgeon: Jerilee Field, MD;  [facility]: [facility];  Service: Urology;  Laterality: N/A;  . INTERSTIM IMPLANT PLACEMENT N/A 10/18/2015   Procedure: [name] IMPLANT SECOND STAGE;  Surgeon: Jerilee Field, MD;  Location: [facility];  Service: Urology;  Laterality: N/A;  . LAPAROSCOPIC GASTRIC RESTRICTIVE DUODENAL PROCEDURE (DUODENAL SWITCH)  12/ 2003  . ORIF RIGHT WRIST FX  2013   REMOVAL HARDWARE X2 in  2013--  No retained hardware    Family Psychiatric  History:  Family History  Problem Relation Age of Onset  . Alcohol abuse Mother   . Anxiety disorder Mother   . Depression Mother     severe with hx of suicide attempt. treated with ECT  . COPD Mother   . Anxiety disorder Father   . Drug abuse Father   . COPD Father   . Alcohol abuse Maternal Uncle   . Anxiety disorder Maternal Grandmother   .  Depression Maternal Grandmother   . ADD / ADHD Brother     Social History:  Social History   Social History  . Marital status: Divorced    Spouse name: N/A  . Number of children: N/A  . Years of education: N/A   Social History Main Topics  . Smoking status: Current Every Day Smoker    Packs/day: 0.50    Years: 0.00    Types: Cigarettes  . Smokeless tobacco: Never Used  . Alcohol use No  . Drug use: No     Comment: hx of and last used cocaine 2012 and  marjiuana 7g/week. smoking thru out the day  . Sexual activity: Not Asked   Other Topics Concern  . None   Social History Narrative  .  None    Allergies:  Allergies  Allergen Reactions  . Bactrim [Sulfamethoxazole-Trimethoprim] Other (See Comments)    Yeast infection  . Penicillins Other (See Comments)    Has patient had a PCN reaction causing immediate rash, facial/tongue/throat swelling, SOB or lightheadedness with hypotension: NO (UTI, YEAST INFECTION) Has patient had a PCN reaction causing severe rash involving mucus membranes or skin necrosis: NO Has patient had a PCN reaction that required hospitalization NO Has patient had a PCN reaction occurring within the last 10 years: NO If all of the above answers are "NO", then may proceed with Cephalosporin use.   . Ciprofloxacin Itching  . Morphine And Related Itching and Other (See Comments)    "whole body feels tight feeling"  . Reglan [Metoclopramide]   . Benadryl [Diphenhydramine Hcl] Palpitations  . Diflucan [Fluconazole] Rash    Metabolic Disorder Labs: No results found for: HGBA1C, MPG No results found for: PROLACTIN No results found for: CHOL, TRIG, HDL, CHOLHDL, VLDL, LDLCALC   Current Medications: Current Outpatient Prescriptions  Medication Sig Dispense Refill  . lamoTRIgine (LAMICTAL) 150 MG tablet Take 2 tablets (300 mg total) by mouth daily. 60 tablet 1  . promethazine (PHENERGAN) 25 MG tablet Take 25 mg by mouth every 6 (six) hours as needed for nausea or vomiting.    Marland Kitchen albuterol (PROAIR HFA) 108 (90 BASE) MCG/ACT inhaler Inhale 2 puffs into the lungs every 6 (six) hours as needed for wheezing or shortness of breath. (Patient not taking: Reported on 10/24/2016) 1 Inhaler 3  . Cholecalciferol (VITAMIN D3) 2000 units TABS Take 1 tablet by mouth daily.    Marland Kitchen dicyclomine (BENTYL) 10 MG capsule Take 10 mg by mouth 4 (four) times daily -  before meals and at bedtime.    . fluconazole (DIFLUCAN) 150 MG tablet Take 1 tablet (150 mg total) by mouth once. May repeat in 4 days (Patient not taking: Reported on 12/05/2015) 2 tablet 0  . FLUZONE QUADRIVALENT 0.5 ML  injection     . haloperidol (HALDOL) 1 MG tablet Take 1 mg by mouth daily.    Marland Kitchen ibuprofen (ADVIL,MOTRIN) 200 MG tablet Take 800 mg by mouth every 6 (six) hours as needed for moderate pain. Reported on 07/20/2015    . lubiprostone (AMITIZA) 8 MCG capsule Take 8 mcg by mouth daily with breakfast.    . naproxen (NAPROSYN) 500 MG tablet Take 1 tablet (500 mg total) by mouth 2 (two) times daily as needed for mild pain, moderate pain or headache (TAKE WITH MEALS.). (Patient not taking: Reported on 09/05/2016) 20 tablet 0  . OLANZapine (ZYPREXA) 7.5 MG tablet Take 7.5 mg by mouth daily.    . ondansetron (ZOFRAN ODT) 8 MG disintegrating tablet  Take 1 tablet (8 mg total) by mouth every 8 (eight) hours as needed for nausea or vomiting. (Patient not taking: Reported on 09/05/2016) 10 tablet 0  . ondansetron (ZOFRAN) 8 MG tablet Take 8 mg by mouth 3 (three) times daily as needed for nausea or vomiting.     . pantoprazole (PROTONIX) 40 MG tablet Take 40 mg by mouth every morning.     . ranitidine (ZANTAC) 150 MG tablet Take 150 mg by mouth every morning. Reported on 12/05/2015    . sertraline (ZOLOFT) 100 MG tablet Take 100 mg by mouth daily.    . traMADol (ULTRAM) 50 MG tablet Take 1 tablet (50 mg total) by mouth every 6 (six) hours as needed for moderate pain. (Patient not taking: Reported on 12/05/2015) 30 tablet 0   No current facility-administered medications for this visit.       Musculoskeletal: Strength & Muscle Tone: within normal limits Gait & Station: normal Patient leans: N/A  Psychiatric Specialty Exam: Review of Systems  Gastrointestinal: Negative for abdominal pain, heartburn, nausea and vomiting.  Musculoskeletal: Negative for back pain, joint pain and neck pain.  Neurological: Negative for dizziness, tremors, sensory change, seizures, loss of consciousness and headaches.    Blood pressure 110/68, pulse 98, height 5\' 3"  (1.6 m), weight 100 lb 6.4 oz (45.5 kg).Body mass index is 17.79 kg/m.   General Appearance: Casual  Eye Contact:  Good  Speech:  Clear and Coherent and Normal Rate  Volume:  Normal  Mood:  Depressed and Irritable  Affect:  Congruent  Thought Process:  Goal Directed and Descriptions of Associations: Intact  Orientation:  Full (Time, Place, and Person)  Thought Content: Logical   Suicidal Thoughts:  No  Homicidal Thoughts:  No  Memory:  Immediate;   Good Recent;   Good Remote;   Good  Judgement:  Intact  Insight:  Present  Psychomotor Activity:  Normal  Concentration:  Concentration: Good and Attention Span: Good  Recall:  Good  Fund of Knowledge: Good  Language: Good  Akathisia:  No  Handed:  Right  AIMS (if indicated):  n/a  Assets:  Communication Skills Desire for Improvement Housing Social Support Talents/Skills  ADL's:  Intact  Cognition: WNL  Sleep:  fair     Treatment Plan Summary:Medication management and Plan see below   Assessment: Impulse control disorder  Anorexia Nervosa  Cannabis dependence,  MDD- moderate, recurrent vs Bipolar II GAD,  ADD- inattentive type,  Insomnia   Medication management with supportive therapy. Risks/benefits and SE of the medication discussed. Pt verbalized understanding and verbal consent obtained for treatment.  Affirm with the patient that the medications are taken as ordered. Patient expressed understanding of how their medications were to be used.   The risk of un-intended pregnancy is low based on the fact that pt reports she is not sexually active. Pt is aware that these meds carry a teratogenic risk. Pt will discuss plan of action if she does or plans to become pregnant in the future.   Meds: Lamictal 300mg  po qD for depression  Start trial of Seleginine 6mg /24hr TD patch for depression and anxiety   Labs: none     Therapy: brief supportive therapy provided. Discussed psychosocial stressors in detail.   Encouraged pt to develop daily routine and work on daily goal setting as a way  to improve mood symptoms.    Consultations:encouraged to continue individual therapy and NA meetings  Pt denies SI and is at an acute low  risk for suicide. Patient told to call clinic if any problems occur. Patient advised to go to ER if they should develop SI/HI, side effects, or if symptoms worsen. Has crisis numbers to call if needed. Pt verbalized understanding.  F/up in 2 months or sooner if needed   Oletta Darter, MD 10/24/2016, 1:35 PM

## 2016-10-24 NOTE — Telephone Encounter (Signed)
[employer] [facility] and spoke to [facility] , the medication has been discontinued

## 2016-10-24 NOTE — Telephone Encounter (Signed)
Please call pt's pharmacy and d/c the Seleginine patch.

## 2016-10-25 ENCOUNTER — Encounter (HOSPITAL_COMMUNITY): Payer: Self-pay | Admitting: Clinical

## 2016-10-29 ENCOUNTER — Ambulatory Visit (INDEPENDENT_AMBULATORY_CARE_PROVIDER_SITE_OTHER): Payer: 59 | Admitting: Clinical

## 2016-10-29 ENCOUNTER — Ambulatory Visit: Payer: Self-pay | Admitting: Family

## 2016-10-29 ENCOUNTER — Encounter (HOSPITAL_COMMUNITY): Payer: Self-pay | Admitting: Clinical

## 2016-10-29 DIAGNOSIS — F639 Impulse disorder, unspecified: Secondary | ICD-10-CM | POA: Diagnosis not present

## 2016-10-29 DIAGNOSIS — F5000 Anorexia nervosa, unspecified: Secondary | ICD-10-CM | POA: Diagnosis not present

## 2016-10-29 DIAGNOSIS — F3130 Bipolar disorder, current episode depressed, mild or moderate severity, unspecified: Secondary | ICD-10-CM

## 2016-10-29 NOTE — Progress Notes (Signed)
   THERAPIST PROGRESS NOTE  Session Time: 1:35 -2:25  Participation Level: Active  Behavioral Response: CasualAlertAnxious  Type of Therapy: Individual Therapy  Treatment Goals addressed: improve psychiatric symptoms, elevate mood , improve unhelpful thought patterns,  emotional regulation skills (decreased impulsive behaviors, decreased anger), learn about diagnosis, healthy coping skills   Interventions: cbt, motivational interviewing,    Summary: Karen Hahn is a 41 y.o. female who presents with Bipolar I disorder and Impulse control disorder, and Anorexia Nervosa  Suicidal/Homicidal: No, without intent/plan  Therapist Response: Militza met with clinician for an individual therapy session she discussed her psychiatric symptoms, her current life events and her homework.She shared that she had completed her homework packet on Bipolar . Client and clinician discussed her diagnosis and her vulnerabilities and supports. [name] shared her thoughts and insights from the homework. She shared that she had ups and downs with her symptoms this week. Clinician asked open ended questions and [name] shared about some hurt and angry feelings she experienced this week. Client and clinician used a 7 panel thought record sheet. Clinician asked open ended questions and [name] identified her emotions and rated them. She identified her negative thoughts and the evidence for and against them. She was then able to formulate healthier alternative thoughts. She rerated her emotions much lower. Clinician asked open ended questions and [name] shared her thoughts and insights. Client and clinician discussed how her behavior towards others might change if her thoughts changed. [name] agreed to complete the next homework packet as welll as to continue practicing her grounding and mindfulness techniques  Plan: Return in 1-2 weeks.  Diagnosis: Axis I: Bipolar I disorder and Impulse control disorder, and Anorexia  Nervosa    Razi Hickle A, LCSW 10/29/2016

## 2016-11-01 ENCOUNTER — Emergency Department (HOSPITAL_COMMUNITY)
Admission: EM | Admit: 2016-11-01 | Discharge: 2016-11-01 | Disposition: A | Discharge status: Home / Self Care | Payer: Medicare Other | Attending: Emergency Medicine | Admitting: Emergency Medicine

## 2016-11-01 ENCOUNTER — Encounter (HOSPITAL_COMMUNITY): Payer: Self-pay | Admitting: Emergency Medicine

## 2016-11-01 DIAGNOSIS — J020 Streptococcal pharyngitis: Secondary | ICD-10-CM | POA: Diagnosis not present

## 2016-11-01 DIAGNOSIS — F909 Attention-deficit hyperactivity disorder, unspecified type: Secondary | ICD-10-CM | POA: Insufficient documentation

## 2016-11-01 DIAGNOSIS — B9789 Other viral agents as the cause of diseases classified elsewhere: Secondary | ICD-10-CM

## 2016-11-01 DIAGNOSIS — J028 Acute pharyngitis due to other specified organisms: Secondary | ICD-10-CM

## 2016-11-01 DIAGNOSIS — J00 Acute nasopharyngitis [common cold]: Secondary | ICD-10-CM | POA: Insufficient documentation

## 2016-11-01 DIAGNOSIS — F17210 Nicotine dependence, cigarettes, uncomplicated: Secondary | ICD-10-CM | POA: Insufficient documentation

## 2016-11-01 DIAGNOSIS — J449 Chronic obstructive pulmonary disease, unspecified: Secondary | ICD-10-CM | POA: Insufficient documentation

## 2016-11-01 DIAGNOSIS — R0981 Nasal congestion: Secondary | ICD-10-CM | POA: Diagnosis present

## 2016-11-01 LAB — RAPID STREP SCREEN (MED CTR MEBANE ONLY): Streptococcus, Group A Screen (Direct): NEGATIVE

## 2016-11-01 MED ORDER — BENZONATATE 100 MG PO CAPS: 100.0000 mg | ORAL_CAPSULE | Freq: Three times a day (TID) | ORAL | 0 refills | Status: DC | PRN

## 2016-11-01 MED ORDER — FLUTICASONE PROPIONATE 50 MCG/ACT NA SUSP: 2.0000 | Freq: Every day | NASAL | 0 refills | Status: DC

## 2016-11-01 MED ORDER — CETIRIZINE HCL 10 MG PO TABS: 10.0000 mg | ORAL_TABLET | Freq: Every day | ORAL | 1 refills | Status: DC

## 2016-11-01 MED ORDER — NAPROXEN 250 MG PO TABS: 250.0000 mg | ORAL_TABLET | Freq: Two times a day (BID) | ORAL | 0 refills | Status: DC

## 2016-11-01 NOTE — ED Triage Notes (Signed)
Pt reports cough, sore throat, nausea and chills x4 days. Denies fevers or vomiting.

## 2016-11-01 NOTE — ED Provider Notes (Signed)
[facility]-EMERGENCY DEPT Provider Note   CSN: [id number] Arrival date & time: 11/01/16  1034  By signing my name below, I, Karen Hahn, attest that this documentation has been prepared under the direction and in the presence of Karen Farrier, PA-C. Electronically Signed: Teofilo Hahn, ED Scribe. 11/01/2016. 11:14 AM.    History   Chief Complaint Chief Complaint  Patient presents with  . Influenza   The history is provided by the patient. No language interpreter was used.   HPI Comments:  Karen Hahn is a 41 y.o. female who presents to the Emergency Department complaining of constant nasal congestion x 4 days. Pt complains of associated chills, intermittent fevers, rhinorrhea, slight cough, post-nasal drip, sinus pressure, ear pain. Pt reports a fever of 101 yesterday that has since improved. She denies any fevers today.  Pt took ibuprofen at 0700 today with no relief. Pt denies other associated symptoms. She denies trouble swallowing, Shortness of breath, trouble breathing, wheezing, abdominal pain, vomiting, urinary symptoms or rashes.  Past Medical History:  Diagnosis Date  . ADD (attention deficit disorder)   . Anorexia nervosa without bulimia   . Chronic constipation   . COPD (chronic obstructive pulmonary disease) (HCC)   . GAD (generalized anxiety disorder)   . GERD (gastroesophageal reflux disease)   . History of gastric restrictive surgery   . History of gastric ulcer   . History of Helicobacter pylori infection    after 2003  . Impulse control disorder   . Moderate major depression (HCC)   . Osteopenia   . Post concussion syndrome    fell 10/20/2015  . SMAS (superior mesenteric artery syndrome) (HCC)   . Urge urinary incontinence     Patient Active Problem List   Diagnosis Date Noted  . Severe episode of recurrent major depressive disorder, without psychotic features (HCC) 08/24/2015  . Cough 11/15/2014  . Major depressive disorder, recurrent episode,  moderate (HCC) 07/14/2014  . ADD (attention deficit hyperactivity disorder, inattentive type) 07/14/2014  . Impulse control disorder 07/14/2014  . Depressive disorder, not elsewhere classified 03/08/2014  . GAD (generalized anxiety disorder) 03/08/2014  . Anorexia nervosa 11/17/2013  . Impulse control disorder, unspecified 11/17/2013  . Cannabis abuse 11/17/2013  . Insomnia 11/17/2013    Past Surgical History:  Procedure Laterality Date  . DILATION AND CURETTAGE OF UTERUS  2005  . INTERSTIM IMPLANT PLACEMENT N/A 10/18/2015   Procedure: Karen Hahn IMPLANT FIRST STAGE;  Surgeon: Karen Field, MD;  Location: [facility];  Service: Urology;  Laterality: N/A;  . INTERSTIM IMPLANT PLACEMENT N/A 10/18/2015   Procedure: Karen Hahn IMPLANT SECOND STAGE;  Surgeon: Karen Field, MD;  Location: [facility];  Service: Urology;  Laterality: N/A;  . LAPAROSCOPIC GASTRIC RESTRICTIVE DUODENAL PROCEDURE (DUODENAL SWITCH)  12/ 2003  . ORIF RIGHT WRIST FX  2013   REMOVAL HARDWARE X2 in  2013--  No retained hardware    OB History    No data available       Home Medications    Prior to Admission medications   Medication Sig Start Date End Date Taking? Authorizing Provider  albuterol (PROAIR HFA) 108 (90 BASE) MCG/ACT inhaler Inhale 2 puffs into the lungs every 6 (six) hours as needed for wheezing or shortness of breath. Patient not taking: Reported on 10/24/2016 11/15/14   Leslye Peer, MD  benzonatate (TESSALON) 100 MG capsule Take 1 capsule (100 mg total) by mouth 3 (three) times daily as needed for cough. 11/01/16  Karen Farrier, PA-C  cetirizine (ZYRTEC ALLERGY) 10 MG tablet Take 1 tablet (10 mg total) by mouth daily. 11/01/16   Karen Farrier, PA-C  Cholecalciferol (VITAMIN D3) 2000 units TABS Take 1 tablet by mouth daily.    Historical Provider, MD  dicyclomine (BENTYL) 10 MG capsule Take 10 mg by mouth 4 (four) times daily -  before meals and at bedtime.     Historical Provider, MD  fluconazole (DIFLUCAN) 150 MG tablet Take 1 tablet (150 mg total) by mouth once. May repeat in 4 days Patient not taking: Reported on 12/05/2015 10/18/15   Karen Field, MD  fluticasone [facility]) 50 MCG/ACT nasal spray Place 2 sprays into both nostrils daily. 11/01/16   Karen Farrier, PA-C  FLUZONE QUADRIVALENT 0.5 ML injection  10/04/16   Historical Provider, MD  ibuprofen (ADVIL,MOTRIN) 200 MG tablet Take 800 mg by mouth every 6 (six) hours as needed for moderate pain. Reported on 07/20/2015    Historical Provider, MD  lamoTRIgine (LAMICTAL) 150 MG tablet Take 2 tablets (300 mg total) by mouth daily. 10/24/16   Karen Darter, MD  lubiprostone (AMITIZA) 8 MCG capsule Take 8 mcg by mouth daily with breakfast.    Historical Provider, MD  naproxen (NAPROSYN) 250 MG tablet Take 1 tablet (250 mg total) by mouth 2 (two) times daily with a meal. 11/01/16   Karen Farrier, PA-C  ondansetron (ZOFRAN ODT) 8 MG disintegrating tablet Take 1 tablet (8 mg total) by mouth every 8 (eight) hours as needed for nausea or vomiting. Patient not taking: Reported on 09/05/2016 10/21/15   Karen Street, PA-C  ondansetron (ZOFRAN) 8 MG tablet Take 8 mg by mouth 3 (three) times daily as needed for nausea or vomiting.     Historical Provider, MD  pantoprazole (PROTONIX) 40 MG tablet Take 40 mg by mouth every morning.  12/09/14   Historical Provider, MD  promethazine (PHENERGAN) 25 MG tablet Take 25 mg by mouth every 6 (six) hours as needed for nausea or vomiting.    Historical Provider, MD  ranitidine (ZANTAC) 150 MG tablet Take 150 mg by mouth every morning. Reported on 12/05/2015    Historical Provider, MD  traMADol (ULTRAM) 50 MG tablet Take 1 tablet (50 mg total) by mouth every 6 (six) hours as needed for moderate pain. Patient not taking: Reported on 12/05/2015 10/18/15   Karen Field, MD    Family History Family History  Problem Relation Age of Onset  . Alcohol abuse Mother   . Anxiety disorder  Mother   . Depression Mother     severe with hx of suicide attempt. treated with ECT  . COPD Mother   . Anxiety disorder Father   . Drug abuse Father   . COPD Father   . Alcohol abuse Maternal Uncle   . Anxiety disorder Maternal Grandmother   . Depression Maternal Grandmother   . ADD / ADHD Brother     Social History Social History  Substance Use Topics  . Smoking status: Current Every Day Smoker    Packs/day: 0.50    Years: 0.00    Types: Cigarettes  . Smokeless tobacco: Never Used  . Alcohol use No     Allergies   Bactrim [sulfamethoxazole-trimethoprim]; Penicillins; Ciprofloxacin; Morphine and related; Reglan [metoclopramide]; Benadryl [diphenhydramine hcl]; and Diflucan [fluconazole]   Review of Systems Review of Systems  Constitutional: Positive for chills. Negative for fever.  HENT: Positive for congestion, ear pain, postnasal drip, rhinorrhea, sinus pressure and sore throat (resolved ). Negative for trouble  swallowing and voice change.   Eyes: Negative for pain.  Respiratory: Positive for cough. Negative for shortness of breath and wheezing.   Cardiovascular: Negative for chest pain.  Gastrointestinal: Negative for abdominal pain.  Musculoskeletal: Negative for neck pain and neck stiffness.  Skin: Negative for rash.  Neurological: Negative for headaches.     Physical Exam Updated Vital Signs BP 104/76   Pulse 95   Temp 98.1 F (36.7 C) (Oral)   Resp 14   LMP 10/11/2016   SpO2 100%   Physical Exam  Constitutional: She appears well-developed and well-nourished. No distress.  Nontoxic appearing.  HENT:  Head: Normocephalic and atraumatic.  Right Ear: External ear normal.  Left Ear: External ear normal.  Mouth/Throat: Oropharynx is clear and moist. No oropharyngeal exudate.  No TM erythema or loss of landmarks. Rhinorrhea present and boggy nasal turbinates.  No PTA, drooling, or trismus. No tonsillar hypertrophy, exudates, or erythema.   Eyes:  Conjunctivae are normal. Pupils are equal, round, and reactive to light. Right eye exhibits no discharge. Left eye exhibits no discharge.  Neck: Normal range of motion. Neck supple. No JVD present. No tracheal deviation present.  Cardiovascular: Normal rate, regular rhythm, normal heart sounds and intact distal pulses.  Exam reveals no gallop and no friction rub.   No murmur heard. Pulmonary/Chest: Effort normal and breath sounds normal. No stridor. No respiratory distress. She has no wheezes. She has no rales.  Lungs are clear to ascultation bilaterally. Symmetric chest expansion bilaterally. No increased work of breathing. No rales or rhonchi.    Abdominal: Soft. There is no tenderness.  Musculoskeletal: She exhibits no edema.  Lymphadenopathy:    She has no cervical adenopathy.  Neurological: She is alert. Coordination normal.  Skin: Skin is warm and dry. Capillary refill takes less than 2 seconds. No rash noted. She is not diaphoretic. No erythema. No pallor.  Psychiatric: She has a normal mood and affect. Her behavior is normal.  Nursing note and vitals reviewed.    ED Treatments / Results  DIAGNOSTIC STUDIES:  Oxygen Saturation is 100% on RA, normal by my interpretation.    COORDINATION OF CARE:  11:12 AM Will order strep test. Discussed treatment plan with pt at bedside and pt agreed to plan.   Labs (all labs ordered are listed, but only abnormal results are displayed) Labs Reviewed  RAPID STREP SCREEN (NOT AT [facility])  CULTURE, GROUP A STREP [facility])    EKG  EKG Interpretation None       Radiology No results found.  Procedures Procedures (including critical care time)  Medications Ordered in ED Medications - No data to display   Initial Impression / Assessment and Plan / ED Course  I have reviewed the triage vital signs and the nursing notes.  Pertinent labs & imaging results that were available during my care of the patient were reviewed by me and considered  in my medical decision making (see chart for details).     This  is a 41 y.o. female who presents to the Emergency Department complaining of constant nasal congestion x 4 days. Pt complains of associated chills, intermittent fevers, rhinorrhea, slight cough, post-nasal drip, sinus pressure, ear pain. Pt reports a fever of 101 yesterday that has since improved. She denies any fevers today. On exam patient is afebrile nontoxic-appearing. Rapid strep test is negative.. Patients symptoms are consistent with URI, likely viral etiology. Discussed that antibiotics are not indicated for viral infections. Pt will be discharged with symptomatic  treatment.  I advised the patient to follow-up with their primary care provider this week. I advised the patient to return to the emergency department with new or worsening symptoms or new concerns. The patient verbalized understanding and agreement with plan.      Final Clinical Impressions(s) / ED Diagnoses   Final diagnoses:  Acute nasopharyngitis  Sore throat (viral)    New Prescriptions New Prescriptions   BENZONATATE (TESSALON) 100 MG CAPSULE    Take 1 capsule (100 mg total) by mouth 3 (three) times daily as needed for cough.   CETIRIZINE (ZYRTEC ALLERGY) 10 MG TABLET    Take 1 tablet (10 mg total) by mouth daily.   FLUTICASONE (FLONASE) 50 MCG/ACT NASAL SPRAY    Place 2 sprays into both nostrils daily.   NAPROXEN (NAPROSYN) 250 MG TABLET    Take 1 tablet (250 mg total) by mouth 2 (two) times daily with a meal.   I personally performed the services described in this documentation, which was scribed in my presence. The recorded information has been reviewed and is accurate.       Karen Farrier, PA-C 11/01/16 1158    Mancel Bale, MD 11/01/16 [phone]

## 2016-11-03 LAB — CULTURE, GROUP A STREP (THRC)

## 2016-11-05 ENCOUNTER — Ambulatory Visit (HOSPITAL_COMMUNITY): Payer: Self-pay | Admitting: Clinical

## 2016-11-06 ENCOUNTER — Ambulatory Visit (HOSPITAL_COMMUNITY): Payer: Self-pay | Admitting: Clinical

## 2016-11-12 ENCOUNTER — Encounter (HOSPITAL_COMMUNITY): Payer: Self-pay | Admitting: Clinical

## 2016-11-12 ENCOUNTER — Ambulatory Visit (INDEPENDENT_AMBULATORY_CARE_PROVIDER_SITE_OTHER): Payer: 59 | Admitting: Clinical

## 2016-11-12 DIAGNOSIS — F5000 Anorexia nervosa, unspecified: Secondary | ICD-10-CM

## 2016-11-12 DIAGNOSIS — F639 Impulse disorder, unspecified: Secondary | ICD-10-CM

## 2016-11-12 DIAGNOSIS — F3130 Bipolar disorder, current episode depressed, mild or moderate severity, unspecified: Secondary | ICD-10-CM | POA: Diagnosis not present

## 2016-11-12 NOTE — Progress Notes (Signed)
   THERAPIST PROGRESS NOTE  Session Time: 12:07 - 12:58  Participation Level: Active  Behavioral Response: CasualAlertAnxious  Type of Therapy: Individual Therapy  Treatment Goals addressed: improve psychiatric symptoms, elevate mood , improve unhelpful thought patterns,  emotional regulation skills  learn healthy coping skills   Interventions: cbt, motivational interviewing, psychoeducation,,   Summary: Karen Hahn is a 41 y.o. female who presents with Bipolar I disorder and Impulse control disorder, and Anorexia Nervosa  Suicidal/Homicidal: No, without intent/plan  Therapist Response: Karen Hahn met with clinician for an individual therapy session she discussed her psychiatric symptoms, her current life events and her homework. She shared that she has been picking her skin, which she does when she is anxious. Clinician asked open ended questions and had about of anger. Clinician asked open ended questions and Karen Hahn shared about the events, her negative automatic thoughts. Clinician asked if she had ever had similar events which did not bring up the same emotions. Karen Hahn identified other events. Clinician asked open ended questions about her negative automatic thoughts. She the identified the evidence for and against the thoughts. She was then able to formulate healthier alternative thoughts. Client and clinician discussed how her emotions and reaction might change if she believed the healthier alternative thoughts. Karen Hahn and clinician reviewed and discussed her homework stress tolerance #3. Client and clinician discussed how she could apply the information in the packets to her events. Clinician gave her packet 4 and she agreed to complete it before next session as well as to continue to practice her grounding and mindfulness techniques.  Plan: Return in 1-2 weeks.  Diagnosis: Axis I: Bipolar I disorder and Impulse control disorder, and Anorexia Nervosa    Rogelio Waynick A,  LCSW 11/12/2016

## 2016-11-14 ENCOUNTER — Encounter: Payer: Self-pay | Admitting: Family

## 2016-11-14 ENCOUNTER — Other Ambulatory Visit (INDEPENDENT_AMBULATORY_CARE_PROVIDER_SITE_OTHER): Payer: Medicare Other

## 2016-11-14 ENCOUNTER — Ambulatory Visit (INDEPENDENT_AMBULATORY_CARE_PROVIDER_SITE_OTHER): Payer: Medicare Other | Admitting: Family

## 2016-11-14 VITALS — BP 100/70 | HR 88 | Temp 98.2°F | Resp 16 | Ht 63.0 in | Wt 108.4 lb

## 2016-11-14 DIAGNOSIS — K2100 Gastro-esophageal reflux disease with esophagitis, without bleeding: Secondary | ICD-10-CM

## 2016-11-14 DIAGNOSIS — R05 Cough: Secondary | ICD-10-CM | POA: Diagnosis not present

## 2016-11-14 DIAGNOSIS — F332 Major depressive disorder, recurrent severe without psychotic features: Secondary | ICD-10-CM

## 2016-11-14 DIAGNOSIS — K581 Irritable bowel syndrome with constipation: Secondary | ICD-10-CM

## 2016-11-14 DIAGNOSIS — E875 Hyperkalemia: Secondary | ICD-10-CM

## 2016-11-14 DIAGNOSIS — R059 Cough, unspecified: Secondary | ICD-10-CM

## 2016-11-14 LAB — COMPREHENSIVE METABOLIC PANEL
ALT: 14 U/L (ref 0–35)
AST: 16 U/L (ref 0–37)
Albumin: 4.7 g/dL (ref 3.5–5.2)
Alkaline Phosphatase: 73 U/L (ref 39–117)
BUN: 13 mg/dL (ref 6–23)
CO2: 30 mEq/L (ref 19–32)
Calcium: 10.5 mg/dL (ref 8.4–10.5)
Chloride: 104 mEq/L (ref 96–112)
Creatinine, Ser: 0.78 mg/dL (ref 0.40–1.20)
GFR: 87.71 mL/min (ref >60.00)
Glucose, Bld: 102 mg/dL — ABNORMAL HIGH (ref 70–99)
Potassium: 5.3 mEq/L — ABNORMAL HIGH (ref 3.5–5.1)
Sodium: 139 mEq/L (ref 135–145)
Total Bilirubin: 0.3 mg/dL (ref 0.2–1.2)
Total Protein: 7.8 g/dL (ref 6.0–8.3)

## 2016-11-14 LAB — AMYLASE: Amylase: 53 U/L (ref 27–131)

## 2016-11-14 LAB — LIPASE: Lipase: 36 U/L (ref 11.0–59.0)

## 2016-11-14 NOTE — Assessment & Plan Note (Signed)
Stable with no current suicidal ideations and managed by psychiatry. Continue management and follow up per psychiatry.

## 2016-11-14 NOTE — Progress Notes (Signed)
Subjective:    Patient ID: Karen Hahn, female    DOB: [birth year], 41 y.o.   MRN: [id number]  Chief Complaint  Patient presents with  . Establish Care    HPI:  Karen Hahn is a 41 y.o. female who  has a past medical history of ADD (attention deficit disorder); Anorexia nervosa without bulimia; Chronic constipation; COPD (chronic obstructive pulmonary disease) (HCC); Drug abuse; GAD (generalized anxiety disorder); GERD (gastroesophageal reflux disease); History of gastric restrictive surgery; History of gastric ulcer; History of Helicobacter pylori infection; Impulse control disorder; Moderate major depression (HCC); Osteopenia; Post concussion syndrome; SMAS (superior mesenteric artery syndrome) (HCC); and Urge urinary incontinence. and presents today for an office visit to establish care.   1.) GERD - Currently maintained on pantoprazole. Reports taking the medication as prescribed and denies adverse side effects. No current abdominal pain, nausea, vomiting, diarrhea, constipation or blood in stool. Symptoms are generally well controlled with pantoprazole with ranitidine as needed.  2.) IBS with constipation - Currently maintained on Bentyl. Reports taking the medication as prescribed and denies adverse side effects. No current symptoms of constipation and is having normal bowel movements.   3.) Cough  - Currently maintained on albuterol. Reports taking the medication as prescribed and on a daily basis. There was concern for the potential of underlying asthma and was scheduled for a pulmonary function test which was not completed. Since being on the pantoprazole and albuterol her symptoms have improved. No chest pain, wheezing or shortness of breath. Denies any night time awakenings or need for oral steroids.    Allergies  Allergen Reactions  . Bactrim [Sulfamethoxazole-Trimethoprim] Other (See Comments)    Yeast infection  . Penicillins Other (See Comments)    Has patient had a  PCN reaction causing immediate rash, facial/tongue/throat swelling, SOB or lightheadedness with hypotension: NO (UTI, YEAST INFECTION) Has patient had a PCN reaction causing severe rash involving mucus membranes or skin necrosis: NO Has patient had a PCN reaction that required hospitalization NO Has patient had a PCN reaction occurring within the last 10 years: NO If all of the above answers are "NO", then may proceed with Cephalosporin use.   . Ciprofloxacin Itching  . Morphine And Related Itching and Other (See Comments)    "whole body feels tight feeling"  . Reglan [Metoclopramide]   . Benadryl [Diphenhydramine Hcl] Palpitations  . Diflucan [Fluconazole] Rash      Outpatient Medications Prior to Visit  Medication Sig Dispense Refill  . albuterol (PROAIR HFA) 108 (90 BASE) MCG/ACT inhaler Inhale 2 puffs into the lungs every 6 (six) hours as needed for wheezing or shortness of breath. 1 Inhaler 3  . Cholecalciferol (VITAMIN D3) 2000 units TABS Take 1 tablet by mouth daily.    Marland Kitchen dicyclomine (BENTYL) 10 MG capsule Take 10 mg by mouth 4 (four) times daily -  before meals and at bedtime.    . lamoTRIgine (LAMICTAL) 150 MG tablet Take 2 tablets (300 mg total) by mouth daily. 60 tablet 1  . ondansetron (ZOFRAN) 8 MG tablet Take 8 mg by mouth 3 (three) times daily as needed for nausea or vomiting.     . pantoprazole (PROTONIX) 40 MG tablet Take 40 mg by mouth every morning.     . promethazine (PHENERGAN) 25 MG tablet Take 25 mg by mouth every 6 (six) hours as needed for nausea or vomiting.    . ranitidine (ZANTAC) 150 MG tablet Take 150 mg by mouth every morning. Reported on 12/05/2015    .  ondansetron (ZOFRAN ODT) 8 MG disintegrating tablet Take 1 tablet (8 mg total) by mouth every 8 (eight) hours as needed for nausea or vomiting. 10 tablet 0  . benzonatate (TESSALON) 100 MG capsule Take 1 capsule (100 mg total) by mouth 3 (three) times daily as needed for cough. 21 capsule 0  . cetirizine  (ZYRTEC ALLERGY) 10 MG tablet Take 1 tablet (10 mg total) by mouth daily. 30 tablet 1  . fluconazole (DIFLUCAN) 150 MG tablet Take 1 tablet (150 mg total) by mouth once. May repeat in 4 days (Patient not taking: Reported on 12/05/2015) 2 tablet 0  . fluticasone (FLONASE) 50 MCG/ACT nasal spray Place 2 sprays into both nostrils daily. 16 g 0  . FLUZONE QUADRIVALENT 0.5 ML injection     . ibuprofen (ADVIL,MOTRIN) 200 MG tablet Take 800 mg by mouth every 6 (six) hours as needed for moderate pain. Reported on 07/20/2015    . lubiprostone (AMITIZA) 8 MCG capsule Take 8 mcg by mouth daily with breakfast.    . naproxen (NAPROSYN) 250 MG tablet Take 1 tablet (250 mg total) by mouth 2 (two) times daily with a meal. 30 tablet 0  . traMADol (ULTRAM) 50 MG tablet Take 1 tablet (50 mg total) by mouth every 6 (six) hours as needed for moderate pain. (Patient not taking: Reported on 12/05/2015) 30 tablet 0   No facility-administered medications prior to visit.      Past Medical History:  Diagnosis Date  . ADD (attention deficit disorder)   . Anorexia nervosa without bulimia   . Chronic constipation   . COPD (chronic obstructive pulmonary disease) (HCC)   . Drug abuse   . GAD (generalized anxiety disorder)   . GERD (gastroesophageal reflux disease)   . History of gastric restrictive surgery   . History of gastric ulcer   . History of Helicobacter pylori infection    after 2003  . Impulse control disorder   . Moderate major depression (HCC)   . Osteopenia   . Post concussion syndrome    fell 10/20/2015  . SMAS (superior mesenteric artery syndrome) (HCC)   . Urge urinary incontinence       Past Surgical History:  Procedure Laterality Date  . DILATION AND CURETTAGE OF UTERUS  2005  . INTERSTIM IMPLANT PLACEMENT N/A 10/18/2015   Procedure: [name] IMPLANT FIRST STAGE;  Surgeon: Jerilee Field, MD;  Location: [facility];  Service: Urology;  Laterality: N/A;  . INTERSTIM IMPLANT  PLACEMENT N/A 10/18/2015   Procedure: [name] IMPLANT SECOND STAGE;  Surgeon: Jerilee Field, MD;  Location: [facility];  Service: Urology;  Laterality: N/A;  . LAPAROSCOPIC GASTRIC RESTRICTIVE DUODENAL PROCEDURE (DUODENAL SWITCH)  12/ 2003  . ORIF RIGHT WRIST FX  2013   REMOVAL HARDWARE X2 in  2013--  No retained hardware      Family History  Problem Relation Age of Onset  . Alcohol abuse Mother   . Anxiety disorder Mother   . Depression Mother     severe with hx of suicide attempt. treated with ECT  . COPD Mother   . Anxiety disorder Father   . Drug abuse Father   . COPD Father   . Alcohol abuse Maternal Uncle   . Anxiety disorder Maternal Grandmother   . Depression Maternal Grandmother   . ADD / ADHD Brother       Social History   Social History  . Marital status: Divorced    Spouse name: N/A  .  Number of children: 0  . Years of education: 14   Occupational History  . Disability    Social History Main Topics  . Smoking status: Current Every Day Smoker    Packs/day: 0.50    Years: 1.00    Types: Cigarettes  . Smokeless tobacco: Never Used  . Alcohol use No  . Drug use: No     Comment: hx of and last used cocaine 2012 and  marjiuana 7g/week. smoking thru out the day  . Sexual activity: Not on file   Other Topics Concern  . Not on file   Social History Narrative   Fun: Play piano; Build Lego   Denies abuse and feels safe at home       Review of Systems  Constitutional: Negative for chills and fever.  Respiratory: Negative for chest tightness, shortness of breath and wheezing.   Cardiovascular: Negative for chest pain, palpitations and leg swelling.  Gastrointestinal: Negative for abdominal distention, abdominal pain, anal bleeding, blood in stool, constipation, diarrhea, nausea, rectal pain and vomiting.  Psychiatric/Behavioral: Negative for behavioral problems, confusion, decreased concentration, dysphoric mood, self-injury, sleep  disturbance and suicidal ideas. The patient is not nervous/anxious.        Objective:    BP 100/70 (BP Location: Left Arm, Patient Position: Sitting, Cuff Size: Normal)   Pulse 88   Temp 98.2 F (36.8 C) (Oral)   Resp 16   Ht 5\' 3"  (1.6 m)   Wt 108 lb 6.4 oz (49.2 kg)   SpO2 98%   BMI 19.20 kg/m  Nursing note and vital signs reviewed.  Physical Exam  Constitutional: She is oriented to person, place, and time. She appears well-developed and well-nourished. No distress.  Cardiovascular: Normal rate, regular rhythm, normal heart sounds and intact distal pulses.   Pulmonary/Chest: Effort normal and breath sounds normal. No respiratory distress. She has no wheezes. She has no rales. She exhibits no tenderness.  Abdominal: Soft. Bowel sounds are normal. She exhibits no distension and no mass. There is no tenderness. There is no rebound and no guarding.  Neurological: She is alert and oriented to person, place, and time.  Skin: Skin is warm and dry.  Psychiatric: She has a normal mood and affect. Her behavior is normal. Judgment and thought content normal.        Assessment & Plan:   Problem List Items Addressed This Visit      Digestive   Gastroesophageal reflux disease with esophagitis    GERD in the setting status post gastric restrictive duodenal procedure and currently stable with pantoprazole daily and ranitidine as needed. Encouraged GERD related diet. Continue current dosage of pantoprazole and ranitidine.       Relevant Orders   Comprehensive metabolic panel (Completed)   Amylase (Completed)   Lipase (Completed)   Irritable bowel syndrome with constipation    Stable with no current symptoms and daily bowel movements. Continue current dosage of Bentyl. Continue to monitor.       Relevant Orders   Comprehensive metabolic panel (Completed)   Amylase (Completed)   Lipase (Completed)     Other   Cough - Primary    Cough with differentials of GERD or underlying  asthma. Obtain PFT. Continue current dosage of albuterol and pantoprazole. Symptoms appear adequately controlled. Follow up and changes pending PFT results.       Severe episode of recurrent major depressive disorder, without psychotic features (HCC)    Stable with no current suicidal ideations and managed by  psychiatry. Continue management and follow up per psychiatry.           I have discontinued Ms. Burgert's lubiprostone, ibuprofen, traMADol, fluconazole, FLUZONE QUADRIVALENT, fluticasone, cetirizine, benzonatate, and naproxen. I am also having her maintain her ondansetron, ranitidine, albuterol, pantoprazole, dicyclomine, Vitamin D3, promethazine, and lamoTRIgine.    Follow-up: Return in about 3 months (around 02/13/2017).  Jeanine Luz, FNP

## 2016-11-14 NOTE — Assessment & Plan Note (Signed)
Cough with differentials of GERD or underlying asthma. Obtain PFT. Continue current dosage of albuterol and pantoprazole. Symptoms appear adequately controlled. Follow up and changes pending PFT results.

## 2016-11-14 NOTE — Patient Instructions (Addendum)
Thank you for choosing [facility].  SUMMARY AND INSTRUCTIONS:  Please schedule a time for your physical / wellness exam.  Continue to take medications as prescribed - let us know when you need a refill.  They will call to schedule your appointment for the PFT / breathing test to rule out asthma.   Labs:  Please stop by the lab on the lower level of the building for your blood work. Your results will be released to MyChart (or called to you) after review, usually within 72 hours after test completion. If any changes need to be made, you will be notified at that same time.  1.) The lab is open from 7:30am to 5:30 pm Monday-Friday 2.) No appointment is necessary 3.) Fasting (if needed) is 6-8 hours after food and drink; black coffee and water are okay   Follow up:  If your symptoms worsen or fail to improve, please contact our office for further instruction, or in case of emergency go directly to the emergency room at the closest medical facility.     Food Choices for Gastroesophageal Reflux Disease, Adult When you have gastroesophageal reflux disease (GERD), the foods you eat and your eating habits are very important. Choosing the right foods can help ease your discomfort. What guidelines do I need to follow?  Choose fruits, vegetables, whole grains, and low-fat dairy products.  Choose low-fat meat, fish, and poultry.  Limit fats such as oils, salad dressings, butter, nuts, and avocado.  Keep a food diary. This helps you identify foods that cause symptoms.  Avoid foods that cause symptoms. These may be different for everyone.  Eat small meals often instead of 3 large meals a day.  Eat your meals slowly, in a place where you are relaxed.  Limit fried foods.  Cook foods using methods other than frying.  Avoid drinking alcohol.  Avoid drinking large amounts of liquids with your meals.  Avoid bending over or lying down until 2-3 hours after eating. What foods are  not recommended? These are some foods and drinks that may make your symptoms worse: Vegetables  Tomatoes. Tomato juice. Tomato and spaghetti sauce. Chili peppers. Onion and garlic. Horseradish. Fruits  Oranges, grapefruit, and lemon (fruit and juice). Meats  High-fat meats, fish, and poultry. This includes hot dogs, ribs, ham, sausage, salami, and bacon. Dairy  Whole milk and chocolate milk. Sour cream. Cream. Butter. Ice cream. Cream cheese. Drinks  Coffee and tea. Bubbly (carbonated) drinks or energy drinks. Condiments  Hot sauce. Barbecue sauce. Sweets/Desserts  Chocolate and cocoa. Donuts. Peppermint and spearmint. Fats and Oils  High-fat foods. This includes [place] fries and potato chips. Other  Vinegar. Strong spices. This includes black pepper, white pepper, red pepper, cayenne, curry powder, cloves, ginger, and chili powder. The items listed above may not be a complete list of foods and drinks to avoid. Contact your dietitian for more information.  This information is not intended to replace advice given to you by your health care provider. Make sure you discuss any questions you have with your health care provider. Document Released: 01/21/2012 Document Revised: 12/28/2015 Document Reviewed: 05/26/2013 Elsevier Interactive Patient Education  2017 [employer].

## 2016-11-14 NOTE — Assessment & Plan Note (Signed)
Stable with no current symptoms and daily bowel movements. Continue current dosage of Bentyl. Continue to monitor.

## 2016-11-14 NOTE — Assessment & Plan Note (Signed)
GERD in the setting status post gastric restrictive duodenal procedure and currently stable with pantoprazole daily and ranitidine as needed. Encouraged GERD related diet. Continue current dosage of pantoprazole and ranitidine.

## 2016-11-19 ENCOUNTER — Ambulatory Visit (HOSPITAL_COMMUNITY): Payer: Self-pay | Admitting: Clinical

## 2016-11-21 ENCOUNTER — Telehealth: Payer: Self-pay | Admitting: Family

## 2016-11-21 NOTE — Telephone Encounter (Signed)
Patient is requesting follow up call in regard to recent labs.

## 2016-11-21 NOTE — Telephone Encounter (Signed)
Pt aware of results 

## 2016-11-26 ENCOUNTER — Ambulatory Visit (INDEPENDENT_AMBULATORY_CARE_PROVIDER_SITE_OTHER): Payer: 59 | Admitting: Clinical

## 2016-11-26 ENCOUNTER — Encounter (HOSPITAL_COMMUNITY): Payer: Self-pay | Admitting: Clinical

## 2016-11-26 DIAGNOSIS — F5000 Anorexia nervosa, unspecified: Secondary | ICD-10-CM | POA: Diagnosis not present

## 2016-11-26 DIAGNOSIS — F639 Impulse disorder, unspecified: Secondary | ICD-10-CM

## 2016-11-26 DIAGNOSIS — F3130 Bipolar disorder, current episode depressed, mild or moderate severity, unspecified: Secondary | ICD-10-CM

## 2016-11-26 NOTE — Progress Notes (Signed)
   THERAPIST PROGRESS NOTE  Session Time: 12:00 - 12:50  Participation Level: Active  Behavioral Response: CasualAlertAnxious  Type of Therapy: Individual Therapy  Treatment Goals addressed: improve psychiatric symptoms, emotional regulation skills , learn about diagnosis, healthy coping skills   Interventions: cbt, motivational interviewing, psychoeducation, grounding and mindfulness techniques,   Summary: Tylicia Sanford is a 41 y.o. female who presents with Bipolar I disorder and Impulse control disorder, and Anorexia Nervosa  Suicidal/Homicidal: No, without intent/plan  Therapist Response: Mikeya met with clinician for an individual therapy session she discussed her psychiatric symptoms, her current life events and her homework. Jameca shared that she had some good days and some bad days since last session with her symptoms. Clinician asked open ended questions and Correna shared about each. Client and cliniciian discussed emotional regulation skills. Sheryle and clinician reviewed and discussed her homework packet distress tolerance 4. Client and clinician discussed stress triggers and her impulsive behaviors. Client and clinician discussed how to use her grounding and mindfulness techniques to decrease her impulsive behaviors. Client and clinician discussed her diagnosis and symptoms. Clinician gave her a homework packet on bipolar disorder which she agreed to complete and bring with her to next session.   Plan: Return in 1-2 weeks.  Diagnosis: Axis I: Bipolar I disorder and Impulse control disorder, and Anorexia Nervosa    Jaishaun Mcnab A, LCSW 11/26/2016

## 2016-11-29 ENCOUNTER — Encounter (HOSPITAL_COMMUNITY): Payer: Self-pay | Admitting: Clinical

## 2016-12-05 ENCOUNTER — Other Ambulatory Visit: Payer: Self-pay | Admitting: *Deleted

## 2016-12-05 MED ORDER — RANITIDINE HCL 150 MG PO TABS: 150.0000 mg | ORAL_TABLET | Freq: Every morning | ORAL | 2 refills | Status: DC

## 2016-12-05 MED ORDER — ALBUTEROL SULFATE HFA 108 (90 BASE) MCG/ACT IN AERS: 2.0000 | INHALATION_SPRAY | Freq: Four times a day (QID) | RESPIRATORY_TRACT | 3 refills | Status: DC | PRN

## 2016-12-05 MED ORDER — PANTOPRAZOLE SODIUM 40 MG PO TBEC: 40.0000 mg | DELAYED_RELEASE_TABLET | Freq: Every morning | ORAL | 2 refills | Status: DC

## 2016-12-05 MED ORDER — DICYCLOMINE HCL 10 MG PO CAPS: 10.0000 mg | ORAL_CAPSULE | Freq: Three times a day (TID) | ORAL | 2 refills | Status: DC

## 2016-12-05 MED ORDER — ONDANSETRON HCL 8 MG PO TABS: 8.0000 mg | ORAL_TABLET | Freq: Three times a day (TID) | ORAL | 0 refills | Status: DC | PRN

## 2016-12-05 NOTE — Telephone Encounter (Signed)
Rec'd call pt states she saw [name] couple weeks ago and at that time she did not need refills on her medications. Requesting new scripts to be sent to [facility] since she is no longer seeing previous MD. Verified which meds she is needing sent to [facility] electronically...[name]

## 2016-12-10 ENCOUNTER — Other Ambulatory Visit (INDEPENDENT_AMBULATORY_CARE_PROVIDER_SITE_OTHER): Payer: Medicare Other

## 2016-12-10 DIAGNOSIS — E875 Hyperkalemia: Secondary | ICD-10-CM

## 2016-12-10 LAB — BASIC METABOLIC PANEL
BUN: 12 mg/dL (ref 6–23)
CO2: 29 mEq/L (ref 19–32)
Calcium: 9.6 mg/dL (ref 8.4–10.5)
Chloride: 105 mEq/L (ref 96–112)
Creatinine, Ser: 0.84 mg/dL (ref 0.40–1.20)
GFR: 80.49 mL/min (ref >60.00)
Glucose, Bld: 74 mg/dL (ref 70–99)
Potassium: 4.5 mEq/L (ref 3.5–5.1)
Sodium: 139 mEq/L (ref 135–145)

## 2016-12-19 ENCOUNTER — Other Ambulatory Visit: Payer: Medicare Other

## 2016-12-19 ENCOUNTER — Encounter: Payer: Self-pay | Admitting: Family

## 2016-12-19 ENCOUNTER — Ambulatory Visit (HOSPITAL_COMMUNITY): Payer: Self-pay | Admitting: Clinical

## 2016-12-19 ENCOUNTER — Ambulatory Visit (INDEPENDENT_AMBULATORY_CARE_PROVIDER_SITE_OTHER): Payer: Medicare Other | Admitting: Family

## 2016-12-19 VITALS — BP 100/60 | HR 104 | Temp 98.9°F | Resp 14 | Ht 63.0 in | Wt 101.0 lb

## 2016-12-19 DIAGNOSIS — R1084 Generalized abdominal pain: Secondary | ICD-10-CM

## 2016-12-19 DIAGNOSIS — R300 Dysuria: Secondary | ICD-10-CM | POA: Diagnosis not present

## 2016-12-19 LAB — POCT URINALYSIS DIPSTICK
Bilirubin, UA: NEGATIVE
Blood, UA: NEGATIVE
Glucose, UA: NEGATIVE
Ketones, UA: NEGATIVE
Leukocytes, UA: NEGATIVE
Nitrite, UA: NEGATIVE
Protein, UA: NEGATIVE
Spec Grav, UA: 1.015 (ref 1.010–1.025)
Urobilinogen, UA: NEGATIVE E.U./dL — AB
pH, UA: 6 (ref 5.0–8.0)

## 2016-12-19 LAB — POCT URINE PREGNANCY: Preg Test, Ur: NEGATIVE

## 2016-12-19 MED ORDER — NITROFURANTOIN MONOHYD MACRO 100 MG PO CAPS: 100.0000 mg | ORAL_CAPSULE | Freq: Two times a day (BID) | ORAL | 0 refills | Status: DC

## 2016-12-19 NOTE — Assessment & Plan Note (Signed)
Symptoms concerning for urinary tract infection and possible pyelonephritis. In office UA is negative for leukocytes, nitrites or hematuria. Urine will be sent for culture. Start Macrobid. Follow up if symptoms worsen or do not improve.

## 2016-12-19 NOTE — Progress Notes (Signed)
Subjective:    Patient ID: Karen Hahn, female    DOB: 16-Oct-1975, 41 y.o.   MRN: [id number]  Chief Complaint  Patient presents with  . Abdominal Pain    abdominal pain, nausea, lower back pain, keytones were high, checks it at home often due to anorexia    HPI:  Karen Hahn is a 41 y.o. female who  has a past medical history of ADD (attention deficit disorder); Anorexia nervosa without bulimia; Chronic constipation; COPD (chronic obstructive pulmonary disease) (HCC); Drug abuse; GAD (generalized anxiety disorder); GERD (gastroesophageal reflux disease); History of gastric restrictive surgery; History of gastric ulcer; History of Helicobacter pylori infection; Impulse control disorder; Moderate major depression (HCC); Osteopenia; Post concussion syndrome; SMAS (superior mesenteric artery syndrome) (HCC); and Urge urinary incontinence. and presents today for an acute office visit.   This is a new problem. Associated symptoms of abdominal pain, nausea and lower back pain have been going on for the last 24 hours. No fevers. Modifying factors include Zofran and promethazine which have not helped. Describes dysruria with no freqeuncy or urgency. No vaginal discharge. Has noted ketones in her urine. Eating and drinking well.     Allergies  Allergen Reactions  . Bactrim [Sulfamethoxazole-Trimethoprim] Other (See Comments)    Yeast infection  . Penicillins Other (See Comments)    Has patient had a PCN reaction causing immediate rash, facial/tongue/throat swelling, SOB or lightheadedness with hypotension: NO (UTI, YEAST INFECTION) Has patient had a PCN reaction causing severe rash involving mucus membranes or skin necrosis: NO Has patient had a PCN reaction that required hospitalization NO Has patient had a PCN reaction occurring within the last 10 years: NO If all of the above answers are "NO", then may proceed with Cephalosporin use.   . Ciprofloxacin Itching  . Morphine And Related  Itching and Other (See Comments)    "whole body feels tight feeling"  . Reglan [Metoclopramide]   . Benadryl [Diphenhydramine Hcl] Palpitations  . Diflucan [Fluconazole] Rash      Outpatient Medications Prior to Visit  Medication Sig Dispense Refill  . albuterol (PROAIR HFA) 108 (90 Base) MCG/ACT inhaler Inhale 2 puffs into the lungs every 6 (six) hours as needed for wheezing or shortness of breath. 1 Inhaler 3  . Cholecalciferol (VITAMIN D3) 2000 units TABS Take 1 tablet by mouth daily.    Marland Kitchen dicyclomine (BENTYL) 10 MG capsule Take 1 capsule (10 mg total) by mouth 4 (four) times daily -  before meals and at bedtime. 120 capsule 2  . lamoTRIgine (LAMICTAL) 150 MG tablet Take 2 tablets (300 mg total) by mouth daily. 60 tablet 1  . ondansetron (ZOFRAN) 8 MG tablet Take 1 tablet (8 mg total) by mouth 3 (three) times daily as needed for nausea or vomiting. 20 tablet 0  . pantoprazole (PROTONIX) 40 MG tablet Take 1 tablet (40 mg total) by mouth every morning. 30 tablet 2  . promethazine (PHENERGAN) 25 MG tablet Take 25 mg by mouth every 6 (six) hours as needed for nausea or vomiting.    . ranitidine (ZANTAC) 150 MG tablet Take 1 tablet (150 mg total) by mouth every morning. 30 tablet 2   No facility-administered medications prior to visit.       Past Surgical History:  Procedure Laterality Date  . DILATION AND CURETTAGE OF UTERUS  2005  . INTERSTIM IMPLANT PLACEMENT N/A 10/18/2015   Procedure: [name] IMPLANT FIRST STAGE;  Surgeon: Jerilee Field, MD;  Location: [facility];  Service:  Urology;  Laterality: N/A;  . INTERSTIM IMPLANT PLACEMENT N/A 10/18/2015   Procedure: [name] IMPLANT SECOND STAGE;  Surgeon: Jerilee Field, MD;  Location: [facility];  Service: Urology;  Laterality: N/A;  . LAPAROSCOPIC GASTRIC RESTRICTIVE DUODENAL PROCEDURE (DUODENAL SWITCH)  12/ 2003  . ORIF RIGHT WRIST FX  2013   REMOVAL HARDWARE X2 in  2013--  No retained hardware        Past Medical History:  Diagnosis Date  . ADD (attention deficit disorder)   . Anorexia nervosa without bulimia   . Chronic constipation   . COPD (chronic obstructive pulmonary disease) (HCC)   . Drug abuse   . GAD (generalized anxiety disorder)   . GERD (gastroesophageal reflux disease)   . History of gastric restrictive surgery   . History of gastric ulcer   . History of Helicobacter pylori infection    after 2003  . Impulse control disorder   . Moderate major depression (HCC)   . Osteopenia   . Post concussion syndrome    fell 10/20/2015  . SMAS (superior mesenteric artery syndrome) (HCC)   . Urge urinary incontinence       Review of Systems  Constitutional: Negative for chills and fever.  Respiratory: Negative for chest tightness and shortness of breath.   Cardiovascular: Negative for chest pain, palpitations and leg swelling.  Genitourinary: Positive for dysuria and flank pain. Negative for frequency, hematuria and urgency.      Objective:    BP 100/60 (BP Location: Left Arm, Patient Position: Sitting, Cuff Size: Normal)   Pulse (!) 104   Temp 98.9 F (37.2 C) (Oral)   Resp 14   Ht 5\' 3"  (1.6 m)   Wt 101 lb (45.8 kg)   SpO2 98%   BMI 17.89 kg/m  Nursing note and vital signs reviewed.  Physical Exam  Constitutional: She is oriented to person, place, and time. She appears well-developed and well-nourished. No distress.  Cardiovascular: Normal rate, regular rhythm, normal heart sounds and intact distal pulses.   Pulmonary/Chest: Effort normal and breath sounds normal.  Abdominal: She exhibits no distension and no mass. There is tenderness. There is CVA tenderness. There is no rebound and no guarding.  Neurological: She is alert and oriented to person, place, and time.  Skin: Skin is warm and dry.  Psychiatric: She has a normal mood and affect. Her behavior is normal. Judgment and thought content normal.       Assessment & Plan:   Problem List Items  Addressed This Visit      Other   Dysuria - Primary    Symptoms concerning for urinary tract infection and possible pyelonephritis. In office UA is negative for leukocytes, nitrites or hematuria. Urine will be sent for culture. Start Macrobid. Follow up if symptoms worsen or do not improve.       Relevant Medications   nitrofurantoin, macrocrystal-monohydrate, (MACROBID) 100 MG capsule   Other Relevant Orders   POCT urinalysis dipstick (Completed)   POCT urine pregnancy (Completed)   Urine culture       I am having Ms. Dunford start on nitrofurantoin (macrocrystal-monohydrate). I am also having her maintain her Vitamin D3, promethazine, lamoTRIgine, ranitidine, pantoprazole, dicyclomine, albuterol, and ondansetron.   Meds ordered this encounter  Medications  . nitrofurantoin, macrocrystal-monohydrate, (MACROBID) 100 MG capsule    Sig: Take 1 capsule (100 mg total) by mouth 2 (two) times daily.    Dispense:  14 capsule    Refill:  0  Order Specific Question:   Supervising Provider    Answer:   Pricilla Holm A [[id number]]     Follow-up: Return if symptoms worsen or fail to improve.  Mauricio Po, FNP

## 2016-12-19 NOTE — Patient Instructions (Signed)
Thank you for choosing [facility].  SUMMARY AND INSTRUCTIONS:  Start taking the Macrobid.  Take 1 Uribel tablet up to 4 times daily as needed.  Follow up if symptoms worsen or do not improve.   Medication:  Your prescription(s) have been submitted to your pharmacy or been printed and provided for you. Please take as directed and contact our office if you believe you are having problem(s) with the medication(s) or have any questions.  Follow up:  If your symptoms worsen or fail to improve, please contact our office for further instruction, or in case of emergency go directly to the emergency room at the closest medical facility.

## 2016-12-20 LAB — URINE CULTURE

## 2016-12-23 ENCOUNTER — Telehealth: Payer: Self-pay | Admitting: Family

## 2016-12-23 MED ORDER — FOSFOMYCIN TROMETHAMINE 3 G PO PACK: 3.0000 g | PACK | Freq: Once | ORAL | 0 refills | Status: AC

## 2016-12-23 NOTE — Telephone Encounter (Signed)
Fosfomycin sent to pharmacy

## 2016-12-23 NOTE — Telephone Encounter (Signed)
Pt aware of new med sent.

## 2016-12-23 NOTE — Telephone Encounter (Signed)
Pt states she believes she is reacting to nitrofurantoin, macrocrystal-monohydrate, (MACROBID) 100 MG capsule  She is having bad back pain, fatigue and has had diarrhea for a couple days.   Would like a call back.

## 2016-12-23 NOTE — Telephone Encounter (Signed)
Can you please send alternative

## 2016-12-25 ENCOUNTER — Emergency Department (HOSPITAL_COMMUNITY): Payer: Medicare Other

## 2016-12-25 ENCOUNTER — Telehealth: Payer: Self-pay | Admitting: *Deleted

## 2016-12-25 ENCOUNTER — Emergency Department (HOSPITAL_COMMUNITY)
Admission: EM | Admit: 2016-12-25 | Discharge: 2016-12-25 | Disposition: A | Discharge status: Home / Self Care | Payer: Medicare Other | Attending: Emergency Medicine | Admitting: Emergency Medicine

## 2016-12-25 DIAGNOSIS — F17210 Nicotine dependence, cigarettes, uncomplicated: Secondary | ICD-10-CM | POA: Diagnosis not present

## 2016-12-25 DIAGNOSIS — J449 Chronic obstructive pulmonary disease, unspecified: Secondary | ICD-10-CM | POA: Diagnosis not present

## 2016-12-25 DIAGNOSIS — M545 Low back pain, unspecified: Secondary | ICD-10-CM

## 2016-12-25 DIAGNOSIS — R319 Hematuria, unspecified: Secondary | ICD-10-CM | POA: Insufficient documentation

## 2016-12-25 DIAGNOSIS — F909 Attention-deficit hyperactivity disorder, unspecified type: Secondary | ICD-10-CM | POA: Diagnosis not present

## 2016-12-25 DIAGNOSIS — R109 Unspecified abdominal pain: Secondary | ICD-10-CM | POA: Diagnosis not present

## 2016-12-25 LAB — URINALYSIS, ROUTINE W REFLEX MICROSCOPIC
Bacteria, UA: NONE SEEN
Bilirubin Urine: NEGATIVE
Glucose, UA: NEGATIVE mg/dL
Ketones, ur: NEGATIVE mg/dL
Leukocytes, UA: NEGATIVE
Nitrite: NEGATIVE
Protein, ur: NEGATIVE mg/dL
Specific Gravity, Urine: 1.01 (ref 1.005–1.030)
pH: 6 (ref 5.0–8.0)

## 2016-12-25 LAB — CBC WITH DIFFERENTIAL/PLATELET
Basophils Absolute: 0 10*3/uL (ref 0.0–0.1)
Basophils Relative: 1 %
Eosinophils Absolute: 0.1 10*3/uL (ref 0.0–0.7)
Eosinophils Relative: 1 %
HCT: 39 % (ref 36.0–46.0)
Hemoglobin: 12.8 g/dL (ref 12.0–15.0)
Lymphocytes Relative: 25 %
Lymphs Abs: 2 10*3/uL (ref 0.7–4.0)
MCH: 29 pg (ref 26.0–34.0)
MCHC: 32.8 g/dL (ref 30.0–36.0)
MCV: 88.2 fL (ref 78.0–100.0)
Monocytes Absolute: 0.4 10*3/uL (ref 0.1–1.0)
Monocytes Relative: 4 %
Neutro Abs: 5.6 10*3/uL (ref 1.7–7.7)
Neutrophils Relative %: 69 %
Platelets: 254 10*3/uL (ref 150–400)
RBC: 4.42 MIL/uL (ref 3.87–5.11)
RDW: 12.8 % (ref 11.5–15.5)
WBC: 8.2 10*3/uL (ref 4.0–10.5)

## 2016-12-25 LAB — I-STAT CG4 LACTIC ACID, ED
Lactic Acid, Venous: 1.27 mmol/L (ref 0.5–1.9)
Lactic Acid, Venous: 2.11 mmol/L (ref 0.5–1.9)

## 2016-12-25 LAB — COMPREHENSIVE METABOLIC PANEL
ALT: 14 U/L (ref 14–54)
AST: 20 U/L (ref 15–41)
Albumin: 4.4 g/dL (ref 3.5–5.0)
Alkaline Phosphatase: 59 U/L (ref 38–126)
Anion gap: 7 (ref 5–15)
BUN: 11 mg/dL (ref 6–20)
CO2: 24 mmol/L (ref 22–32)
Calcium: 9.4 mg/dL (ref 8.9–10.3)
Chloride: 106 mmol/L (ref 101–111)
Creatinine, Ser: 0.71 mg/dL (ref 0.44–1.00)
GFR calc Af Amer: >60 mL/min (ref >60)
GFR calc non Af Amer: >60 mL/min (ref >60)
Glucose, Bld: 86 mg/dL (ref 65–99)
Potassium: 4.4 mmol/L (ref 3.5–5.1)
Sodium: 137 mmol/L (ref 135–145)
Total Bilirubin: 0.6 mg/dL (ref 0.3–1.2)
Total Protein: 7.2 g/dL (ref 6.5–8.1)

## 2016-12-25 LAB — POC URINE PREG, ED: Preg Test, Ur: NEGATIVE

## 2016-12-25 MED ORDER — ACETAMINOPHEN 325 MG PO TABS
650.0000 mg | ORAL_TABLET | Freq: Once | ORAL | Status: AC
  Administered 2016-12-25: 650 mg via ORAL
  Filled 2016-12-25: qty 2

## 2016-12-25 MED ORDER — CYCLOBENZAPRINE HCL 5 MG PO TABS: 5.0000 mg | ORAL_TABLET | Freq: Two times a day (BID) | ORAL | 0 refills | Status: DC | PRN

## 2016-12-25 MED ORDER — SODIUM CHLORIDE 0.9 % IV BOLUS (SEPSIS)
1000.0000 mL | Freq: Once | INTRAVENOUS | Status: AC
  Administered 2016-12-25: 1000 mL via INTRAVENOUS

## 2016-12-25 MED ORDER — KETOROLAC TROMETHAMINE 30 MG/ML IJ SOLN
30.0000 mg | Freq: Once | INTRAMUSCULAR | Status: AC
  Administered 2016-12-25: 30 mg via INTRAVENOUS
  Filled 2016-12-25: qty 1

## 2016-12-25 MED ORDER — ONDANSETRON HCL 4 MG/2ML IJ SOLN
4.0000 mg | Freq: Once | INTRAMUSCULAR | Status: AC
  Administered 2016-12-25: 4 mg via INTRAVENOUS
  Filled 2016-12-25: qty 2

## 2016-12-25 NOTE — ED Provider Notes (Signed)
[facility]-EMERGENCY DEPT Provider Note   CSN: [id number] Arrival date & time: 12/25/16  1121     History   Chief Complaint Chief Complaint  Patient presents with  . Back Pain    HPI Karen Hahn is a 41 y.o. female.  HPI   41 year old female with history of polysubstance abuse, depression, ADD, osteopenia presenting for evaluation of back pain. Patient states for the past week she has had persistent low back pain. Her back pain is achy, nonradiating, progressively worse and rated as 10 out of 10. She also endorsed burning urination without frequency or urgency. She endorse feeling nauseous, having nonbloody nonbilious vomiting, and having recurrent non-mucousy, nonbloody diarrhea. States she vomits once or twice a day, and having about 4-5 bouts of diarrhea daily. She was seen at urgent care early in the week for the symptoms. She was treated for possible uterine tract infection with Macrobid. She has finished the antibiotic but states it provided no relief. No other specific treatment tried. She denies fever, headache, URI symptoms, chest pain, short of breath, productive cough, hematuria, vaginal bleeding, vaginal discharge. Patient states she is not sexually active currently. She does admits to smoking 4-5 cigarettes a day, denies alcohol abuse or street drug use. Denies IV drug use or active cancer. No leg pain, new focal numbness or weakness, or bowel bladder incontinence.  Past Medical History:  Diagnosis Date  . ADD (attention deficit disorder)   . Anorexia nervosa without bulimia   . Chronic constipation   . COPD (chronic obstructive pulmonary disease) (HCC)   . Drug abuse   . GAD (generalized anxiety disorder)   . GERD (gastroesophageal reflux disease)   . History of gastric restrictive surgery   . History of gastric ulcer   . History of Helicobacter pylori infection    after 2003  . Impulse control disorder   . Moderate major depression (HCC)   . Osteopenia   . Post  concussion syndrome    fell 10/20/2015  . SMAS (superior mesenteric artery syndrome) (HCC)   . Urge urinary incontinence     Patient Active Problem List   Diagnosis Date Noted  . Dysuria 12/19/2016  . Gastroesophageal reflux disease with esophagitis 11/14/2016  . Irritable bowel syndrome with constipation 11/14/2016  . Severe episode of recurrent major depressive disorder, without psychotic features (HCC) 08/24/2015  . Cough 11/15/2014  . Major depressive disorder, recurrent episode, moderate (HCC) 07/14/2014  . ADD (attention deficit hyperactivity disorder, inattentive type) 07/14/2014  . Impulse control disorder 07/14/2014  . Depressive disorder, not elsewhere classified 03/08/2014  . GAD (generalized anxiety disorder) 03/08/2014  . Anorexia nervosa 11/17/2013  . Impulse control disorder, unspecified 11/17/2013  . Cannabis abuse 11/17/2013  . Insomnia 11/17/2013    Past Surgical History:  Procedure Laterality Date  . DILATION AND CURETTAGE OF UTERUS  2005  . INTERSTIM IMPLANT PLACEMENT N/A 10/18/2015   Procedure: [name] IMPLANT FIRST STAGE;  Surgeon: Jerilee Field, MD;  Location: [facility];  Service: Urology;  Laterality: N/A;  . INTERSTIM IMPLANT PLACEMENT N/A 10/18/2015   Procedure: [name] IMPLANT SECOND STAGE;  Surgeon: Jerilee Field, MD;  Location: [facility];  Service: Urology;  Laterality: N/A;  . LAPAROSCOPIC GASTRIC RESTRICTIVE DUODENAL PROCEDURE (DUODENAL SWITCH)  12/ 2003  . ORIF RIGHT WRIST FX  2013   REMOVAL HARDWARE X2 in  2013--  No retained hardware    OB History    No data available       Home Medications  Prior to Admission medications   Medication Sig Start Date End Date Taking? Authorizing Provider  albuterol (PROAIR HFA) 108 (90 Base) MCG/ACT inhaler Inhale 2 puffs into the lungs every 6 (six) hours as needed for wheezing or shortness of breath. 12/05/16   Veryl Speak, FNP  Cholecalciferol (VITAMIN  D3) 2000 units TABS Take 1 tablet by mouth daily.    [provider]  dicyclomine (BENTYL) 10 MG capsule Take 1 capsule (10 mg total) by mouth 4 (four) times daily -  before meals and at bedtime. 12/05/16   Veryl Speak, FNP  lamoTRIgine (LAMICTAL) 150 MG tablet Take 2 tablets (300 mg total) by mouth daily. 10/24/16   Oletta Darter, MD  ondansetron (ZOFRAN) 8 MG tablet Take 1 tablet (8 mg total) by mouth 3 (three) times daily as needed for nausea or vomiting. 12/05/16   Veryl Speak, FNP  pantoprazole (PROTONIX) 40 MG tablet Take 1 tablet (40 mg total) by mouth every morning. 12/05/16   Veryl Speak, FNP  promethazine (PHENERGAN) 25 MG tablet Take 25 mg by mouth every 6 (six) hours as needed for nausea or vomiting.    [provider]  ranitidine (ZANTAC) 150 MG tablet Take 1 tablet (150 mg total) by mouth every morning. 12/05/16   Veryl Speak, FNP    Family History Family History  Problem Relation Age of Onset  . Alcohol abuse Mother   . Anxiety disorder Mother   . Depression Mother        severe with hx of suicide attempt. treated with ECT  . COPD Mother   . Anxiety disorder Father   . Drug abuse Father   . COPD Father   . Alcohol abuse Maternal Uncle   . Anxiety disorder Maternal Grandmother   . Depression Maternal Grandmother   . ADD / ADHD Brother     Social History Social History  Substance Use Topics  . Smoking status: Current Every Day Smoker    Packs/day: 0.50    Years: 1.00    Types: Cigarettes  . Smokeless tobacco: Never Used  . Alcohol use No     Allergies   Bactrim [sulfamethoxazole-trimethoprim]; Penicillins; Ciprofloxacin; Morphine and related; Reglan [metoclopramide]; Benadryl [diphenhydramine hcl]; and Diflucan [fluconazole]   Review of Systems Review of Systems  All other systems reviewed and are negative.    Physical Exam Updated Vital Signs BP 90/64   Pulse 78   Temp 98.4 F (36.9 C) (Oral)   Resp 16   Ht 5\' 3"   (1.6 m)   Wt 45.4 kg (100 lb)   SpO2 100%   BMI 17.71 kg/m   Physical Exam  Constitutional: She appears well-developed and well-nourished. No distress.  Thin-appearing female nontoxic in appearance, resting in bed in no acute discomfort  HENT:  Head: Atraumatic.  Mouth/Throat: Oropharynx is clear and moist.  Eyes: Conjunctivae are normal.  Neck: Neck supple.  Cardiovascular: Normal rate, regular rhythm and intact distal pulses.   Pulmonary/Chest: Effort normal and breath sounds normal.  Abdominal: Soft. Bowel sounds are normal. She exhibits no distension. There is no tenderness.  Musculoskeletal: She exhibits tenderness (Tenderness to paralumbar spinal muscles on palpation. No CVA tenderness. No significant midline spine tenderness).  Neurological: She is alert.  5 out 5 strength to bilateral lower extremities with intact patellar deep tendon reflex and no foot drops.  Skin: No rash noted.  Psychiatric: She has a normal mood and affect.  Nursing note and vitals reviewed.  ED Treatments / Results  Labs (all labs ordered are listed, but only abnormal results are displayed) Labs Reviewed  URINALYSIS, ROUTINE W REFLEX MICROSCOPIC - Abnormal; Notable for the following:       Result Value   Color, Urine STRAW (*)    Hgb urine dipstick MODERATE (*)    Squamous Epithelial / LPF 0-5 (*)    All other components within normal limits  I-STAT CG4 LACTIC ACID, ED - Abnormal; Notable for the following:    Lactic Acid, Venous 2.11 (*)    All other components within normal limits  COMPREHENSIVE METABOLIC PANEL  CBC WITH DIFFERENTIAL/PLATELET  I-STAT CG4 LACTIC ACID, ED  POC URINE PREG, ED    EKG  EKG Interpretation None       Radiology Ct Renal Stone Study  Result Date: 12/25/2016 CLINICAL DATA:  Bilateral flank pain EXAM: CT ABDOMEN AND PELVIS WITHOUT CONTRAST TECHNIQUE: Multidetector CT imaging of the abdomen and pelvis was performed following the standard protocol without  IV contrast. COMPARISON:  CT abdomen pelvis 04/25/2015 FINDINGS: Lower chest: No pulmonary nodules or pleural effusion. No visible pericardial effusion. Hepatobiliary: Normal noncontrast appearance of the liver. No visible biliary dilatation. Normal gallbladder. Pancreas: Normal noncontrast appearance of the pancreas. No peripancreatic fluid collection. Spleen: Normal. Adrenals/Urinary Tract: --Adrenal glands: Normal. --Right kidney/ureter: No hydronephrosis or perinephric stranding. No nephrolithiasis. No obstructing ureteral stones. --Left kidney/ureter: No hydronephrosis or perinephric stranding. No nephrolithiasis. No obstructing ureteral stones. --Urinary bladder: Unremarkable. Stomach/Bowel: There is no hiatal hernia. The stomach and duodenum are normal. There is no dilated small bowel or enteric inflammation. There is no colonic abnormality. The appendix is not visualized. Vascular/Lymphatic: No abdominal aortic aneurysm or atherosclerotic calcification. No abdominal or pelvic lymphadenopathy. Reproductive: Normal uterus and ovaries. Musculoskeletal. The lead from a stimulator device passes through the right S3 foramen. There is mild lower lumbar facet hypertrophy. No bony spinal canal stenosis. IMPRESSION: 1. No obstructive uropathy or nephrolithiasis. 2. No acute abnormality of the abdomen or pelvis. Electronically Signed   By: Deatra Robinson M.D.   On: 12/25/2016 18:06    Procedures Procedures (including critical care time)  Medications Ordered in ED Medications  sodium chloride 0.9 % bolus 1,000 mL (0 mLs Intravenous Stopped 12/25/16 1757)  ondansetron (ZOFRAN) injection 4 mg (4 mg Intravenous Given 12/25/16 1632)  ketorolac (TORADOL) 30 MG/ML injection 30 mg (30 mg Intravenous Given 12/25/16 1633)  acetaminophen (TYLENOL) tablet 650 mg (650 mg Oral Given 12/25/16 1755)     Initial Impression / Assessment and Plan / ED Course  I have reviewed the triage vital signs and the nursing  notes.  Pertinent labs & imaging results that were available during my care of the patient were reviewed by me and considered in my medical decision making (see chart for details).     BP 104/69   Pulse 88   Temp 98.4 F (36.9 C) (Oral)   Resp 16   Ht 5\' 3"  (1.6 m)   Wt 45.4 kg (100 lb)   SpO2 99%   BMI 17.71 kg/m    Final Clinical Impressions(s) / ED Diagnoses   Final diagnoses:  None    New Prescriptions New Prescriptions   CYCLOBENZAPRINE (FLEXERIL) 5 MG TABLET    Take 1 tablet (5 mg total) by mouth 2 (two) times daily as needed for muscle spasms.   3:31 PM Patient with nausea vomiting diarrhea and back pain. Report burning urination, was treated for potential urinary tract infection with Macrobid but no  improvement. She is well-appearing, no red flags. Symptom not suggestive of kidney stones. She has a benign abdominal exam. She is afebrile. Will treat symptoms, workup initiated.  6:20 PM UA shows moderate hemoglobin on urine dipsticks but no signs of urinary tract infection. Pregnancy test is negative. Mildly elevated lactic acid of 2.11. Repeat lactic acid is 1.27 after IV fluid. Her labs otherwise reassuring. A CT renal stone study demonstrate no acute concerning feature. Patient does have a urologist that she will follow-up for further care. Otherwise she is stable for discharge. Return precaution discussed.   Fayrene Helper, PA-C 12/25/16 1826    Loren Racer, MD 12/26/16 [phone]

## 2016-12-25 NOTE — ED Notes (Signed)
RN attempted IV 2x unsuccessfully.  

## 2016-12-25 NOTE — Telephone Encounter (Signed)
She is allergic to all other types of medication.

## 2016-12-25 NOTE — ED Triage Notes (Signed)
Per Pt, pt is coming from home with complaints of lower back pain x 5 days. Was seen by PCP and diagnosed with UTI given Macrobid. Pt finished antibiotics, but reports back pain worsening.

## 2016-12-25 NOTE — Telephone Encounter (Signed)
Rec'd fax stating the rx Fosfomycin is not covered by insurance plan. The alternative is Sulfamethiazole/Trimothoprim...[name]

## 2016-12-25 NOTE — Discharge Instructions (Signed)
You have been evaluated for your back pain. The urine did not shows any signs of urinary tract infection however there are trace of blood in it. Please follow-up with your urologist for further evaluation. Take Flexeril as needed for pain. Follow-up with your primary care Dr. for further care.

## 2016-12-25 NOTE — ED Notes (Signed)
Dr. Berlinda Last not in office RN Erin aware of Lactic acid result 2.11

## 2016-12-25 NOTE — ED Notes (Signed)
Attempted IV stick x1 unsuccessful. Another RN will attempt

## 2016-12-26 ENCOUNTER — Ambulatory Visit (HOSPITAL_COMMUNITY): Payer: Self-pay | Admitting: Clinical

## 2016-12-31 ENCOUNTER — Ambulatory Visit (HOSPITAL_COMMUNITY): Payer: Self-pay | Admitting: Clinical

## 2017-01-01 ENCOUNTER — Ambulatory Visit (INDEPENDENT_AMBULATORY_CARE_PROVIDER_SITE_OTHER): Payer: Medicare Other | Admitting: Family

## 2017-01-01 ENCOUNTER — Encounter: Payer: Self-pay | Admitting: Family

## 2017-01-01 ENCOUNTER — Other Ambulatory Visit (INDEPENDENT_AMBULATORY_CARE_PROVIDER_SITE_OTHER): Payer: Medicare Other

## 2017-01-01 ENCOUNTER — Other Ambulatory Visit: Payer: Self-pay | Admitting: *Deleted

## 2017-01-01 VITALS — BP 102/58 | HR 93 | Temp 98.1°F | Resp 16 | Ht 63.0 in | Wt 100.8 lb

## 2017-01-01 DIAGNOSIS — F332 Major depressive disorder, recurrent severe without psychotic features: Secondary | ICD-10-CM

## 2017-01-01 DIAGNOSIS — B373 Candidiasis of vulva and vagina: Secondary | ICD-10-CM | POA: Diagnosis not present

## 2017-01-01 DIAGNOSIS — R319 Hematuria, unspecified: Secondary | ICD-10-CM | POA: Insufficient documentation

## 2017-01-01 DIAGNOSIS — N63 Unspecified lump in unspecified breast: Secondary | ICD-10-CM | POA: Insufficient documentation

## 2017-01-01 DIAGNOSIS — B3731 Acute candidiasis of vulva and vagina: Secondary | ICD-10-CM | POA: Insufficient documentation

## 2017-01-01 DIAGNOSIS — R5383 Other fatigue: Secondary | ICD-10-CM | POA: Diagnosis not present

## 2017-01-01 DIAGNOSIS — F639 Impulse disorder, unspecified: Secondary | ICD-10-CM

## 2017-01-01 LAB — POCT URINALYSIS DIPSTICK
Bilirubin, UA: NEGATIVE
Blood, UA: NEGATIVE
Glucose, UA: NEGATIVE
Ketones, UA: NEGATIVE
Leukocytes, UA: NEGATIVE
Nitrite, UA: NEGATIVE
Protein, UA: NEGATIVE
Spec Grav, UA: >=1.03 — AB (ref 1.010–1.025)
Urobilinogen, UA: NEGATIVE E.U./dL — AB
pH, UA: 6 (ref 5.0–8.0)

## 2017-01-01 LAB — CBC
HCT: 39 % (ref 36.0–46.0)
Hemoglobin: 13.3 g/dL (ref 12.0–15.0)
MCHC: 34.2 g/dL (ref 30.0–36.0)
MCV: 87.2 fl (ref 78.0–100.0)
Platelets: 266 10*3/uL (ref 150.0–400.0)
RBC: 4.47 Mil/uL (ref 3.87–5.11)
RDW: 13.1 % (ref 11.5–15.5)
WBC: 6.2 10*3/uL (ref 4.0–10.5)

## 2017-01-01 LAB — COMPREHENSIVE METABOLIC PANEL
ALT: 14 U/L (ref 0–35)
AST: 16 U/L (ref 0–37)
Albumin: 4.9 g/dL (ref 3.5–5.2)
Alkaline Phosphatase: 66 U/L (ref 39–117)
BUN: 12 mg/dL (ref 6–23)
CO2: 31 mEq/L (ref 19–32)
Calcium: 9.9 mg/dL (ref 8.4–10.5)
Chloride: 103 mEq/L (ref 96–112)
Creatinine, Ser: 0.77 mg/dL (ref 0.40–1.20)
GFR: 88.96 mL/min (ref >60.00)
Glucose, Bld: 96 mg/dL (ref 70–99)
Potassium: 4.8 mEq/L (ref 3.5–5.1)
Sodium: 137 mEq/L (ref 135–145)
Total Bilirubin: 0.4 mg/dL (ref 0.2–1.2)
Total Protein: 7.7 g/dL (ref 6.0–8.3)

## 2017-01-01 LAB — TSH: TSH: 1.59 u[IU]/mL (ref 0.35–4.50)

## 2017-01-01 MED ORDER — TERCONAZOLE 0.4 % VA CREA: 1.0000 | TOPICAL_CREAM | Freq: Every day | VAGINAL | 0 refills | Status: DC

## 2017-01-01 NOTE — Progress Notes (Signed)
Subjective:    Patient ID: Karen Hahn, female    DOB: 04-Apr-1976, 42 y.o.   MRN: [id number]  Chief Complaint  Patient presents with  . Hospitalization Follow-up    blood in urine, concerned about having yeast infection, breast lump    HPI:  Karen Hahn is a 41 y.o. female who  has a past medical history of ADD (attention deficit disorder); Anorexia nervosa without bulimia; Chronic constipation; COPD (chronic obstructive pulmonary disease) (HCC); Drug abuse; GAD (generalized anxiety disorder); GERD (gastroesophageal reflux disease); History of gastric restrictive surgery; History of gastric ulcer; History of Helicobacter pylori infection; Impulse control disorder; Moderate major depression (HCC); Osteopenia; Post concussion syndrome; SMAS (superior mesenteric artery syndrome) (HCC); and Urge urinary incontinence. and presents today for a follow up office visit.   1.) Vaginal discharge - This is a new problem. Associated symptoms of vaginal discharge and itching has been going on for about 3 days. Recently completed a course of Macrobid. Describes a milky white vaginal discharge. No attempted treatments or modifying factors that make it better or worse.   2.) Breast lump - This is a new problem. Associated symptom of a lump located to the right of her left breast at the 3 o'clock position has been going on for about 3 days. No pain or tenderness. No changes in skin or dimpling. No nipple discharge. Describes that she believes it is new.      Allergies  Allergen Reactions  . Bactrim [Sulfamethoxazole-Trimethoprim] Other (See Comments)    Yeast infection  . Penicillins Other (See Comments)    Has patient had a PCN reaction causing immediate rash, facial/tongue/throat swelling, SOB or lightheadedness with hypotension: NO (UTI, YEAST INFECTION) Has patient had a PCN reaction causing severe rash involving mucus membranes or skin necrosis: NO Has patient had a PCN reaction that required  hospitalization NO Has patient had a PCN reaction occurring within the last 10 years: NO If all of the above answers are "NO", then may proceed with Cephalosporin use.   . Ciprofloxacin Itching  . Morphine And Related Itching and Other (See Comments)    "whole body feels tight feeling"  . Reglan [Metoclopramide]   . Benadryl [Diphenhydramine Hcl] Palpitations  . Diflucan [Fluconazole] Rash      Outpatient Medications Prior to Visit  Medication Sig Dispense Refill  . albuterol (PROAIR HFA) 108 (90 Base) MCG/ACT inhaler Inhale 2 puffs into the lungs every 6 (six) hours as needed for wheezing or shortness of breath. 1 Inhaler 3  . Cholecalciferol (VITAMIN D3) 2000 units TABS Take 1 tablet by mouth daily.    Marland Kitchen dicyclomine (BENTYL) 10 MG capsule Take 1 capsule (10 mg total) by mouth 4 (four) times daily -  before meals and at bedtime. 120 capsule 2  . lamoTRIgine (LAMICTAL) 150 MG tablet Take 2 tablets (300 mg total) by mouth daily. 60 tablet 1  . ondansetron (ZOFRAN) 8 MG tablet Take 1 tablet (8 mg total) by mouth 3 (three) times daily as needed for nausea or vomiting. 20 tablet 0  . pantoprazole (PROTONIX) 40 MG tablet Take 1 tablet (40 mg total) by mouth every morning. 30 tablet 2  . promethazine (PHENERGAN) 25 MG tablet Take 25 mg by mouth every 6 (six) hours as needed for nausea or vomiting.    . ranitidine (ZANTAC) 150 MG tablet Take 1 tablet (150 mg total) by mouth every morning. 30 tablet 2  . cyclobenzaprine (FLEXERIL) 5 MG tablet Take 1 tablet (5 mg total)  by mouth 2 (two) times daily as needed for muscle spasms. 12 tablet 0   No facility-administered medications prior to visit.       Past Surgical History:  Procedure Laterality Date  . DILATION AND CURETTAGE OF UTERUS  2005  . INTERSTIM IMPLANT PLACEMENT N/A 10/18/2015   Procedure: [name] IMPLANT FIRST STAGE;  Surgeon: Jerilee Field, MD;  Location: [facility];  Service: Urology;  Laterality: N/A;  .  INTERSTIM IMPLANT PLACEMENT N/A 10/18/2015   Procedure: [name] IMPLANT SECOND STAGE;  Surgeon: Jerilee Field, MD;  Location[facility];  Service: Urology;  Laterality: N/A;  . LAPAROSCOPIC GASTRIC RESTRICTIVE DUODENAL PROCEDURE (DUODENAL SWITCH)  12/ 2003  . ORIF RIGHT WRIST FX  2013   REMOVAL HARDWARE X2 in  2013--  No retained hardware      Past Medical History:  Diagnosis Date  . ADD (attention deficit disorder)   . Anorexia nervosa without bulimia   . Chronic constipation   . COPD (chronic obstructive pulmonary disease) (HCC)   . Drug abuse   . GAD (generalized anxiety disorder)   . GERD (gastroesophageal reflux disease)   . History of gastric restrictive surgery   . History of gastric ulcer   . History of Helicobacter pylori infection    after 2003  . Impulse control disorder   . Moderate major depression (HCC)   . Osteopenia   . Post concussion syndrome    fell 10/20/2015  . SMAS (superior mesenteric artery syndrome) (HCC)   . Urge urinary incontinence       Review of Systems  Constitutional: Positive for fatigue. Negative for chills and fever.  Respiratory: Negative for chest tightness and shortness of breath.   Cardiovascular: Negative for chest pain, palpitations and leg swelling.  Genitourinary: Positive for vaginal discharge. Negative for dysuria, frequency, hematuria, menstrual problem, pelvic pain, urgency, vaginal bleeding and vaginal pain.  Skin:       Positive for lump in left breast.      Objective:    BP (!) 102/58 (BP Location: Left Arm, Patient Position: Sitting, Cuff Size: Normal)   Pulse 93   Temp 98.1 F (36.7 C) (Oral)   Resp 16   Ht 5\' 3"  (1.6 m)   Wt 100 lb 12.8 oz (45.7 kg)   SpO2 98%   BMI 17.86 kg/m  Nursing note and vital signs reviewed.  Physical Exam  Constitutional: She is oriented to person, place, and time. She appears well-developed and well-nourished. No distress.  Cardiovascular: Normal rate, regular  rhythm, normal heart sounds and intact distal pulses.   Pulmonary/Chest: Effort normal and breath sounds normal. She exhibits no mass and no tenderness. Right breast exhibits no inverted nipple, no mass, no nipple discharge, no skin change and no tenderness. Left breast exhibits mass. Left breast exhibits no inverted nipple, no nipple discharge, no skin change and no tenderness. Breasts are symmetrical.  Neurological: She is alert and oriented to person, place, and time.  Skin: Skin is warm and dry.  Psychiatric: She has a normal mood and affect. Her behavior is normal. Judgment and thought content normal.       Assessment & Plan:   Problem List Items Addressed This Visit      Genitourinary   Candida vaginitis    New onset vaginal discharge in the setting of previous antibiotic completion consistent with candidiasis vaginitis. Start Terazole 7. Follow-up if symptoms worsen or do not improve.        Other  Hematuria - Primary    Hematuria appears resolved with no evidence of blood noted in urinalysis. No other urinary symptoms. No further treatment is necessary at this time. Follow-up if symptoms return.      Relevant Orders   POCT urinalysis dipstick (Completed)   Lump in female breast    New onset lump located at the 4 to 5:00 position breast. Obtain diagnostic mammogram and ultrasound. Follow-up pending results.      Relevant Orders   MM DIAG BREAST TOMO BILATERAL   US BREAST LTD UNI LEFT INC AXILLA   US BREAST LTD UNI RIGHT INC AXILLA   Fatigue    New onset fatigue with concern for reemergence of anorexia nervosa. Obtain CBC, complete metabolic panel, and TSH. Recommend follow-up with psychiatry and possible admittance to eating disorder treatment program. Continue to monitor.      Relevant Orders   CBC (Completed)   Comprehensive metabolic panel (Completed)   TSH (Completed)       I have discontinued Ms. Huisman's cyclobenzaprine. I am also having her start on  terconazole. Additionally, I am having her maintain her Vitamin D3, promethazine, lamoTRIgine, ranitidine, pantoprazole, dicyclomine, albuterol, and ondansetron.   Meds ordered this encounter  Medications  . terconazole (TERAZOL 7) 0.4 % vaginal cream    Sig: Place 1 applicator vaginally at bedtime.    Dispense:  45 g    Refill:  0    Order Specific Question:   Supervising Provider    Answer:   Hillard Danker A [[id number]]     Follow-up: Return in about 3 months (around 04/03/2017), or if symptoms worsen or fail to improve.  Jeanine Luz, FNP

## 2017-01-01 NOTE — Assessment & Plan Note (Signed)
New onset fatigue with concern for reemergence of anorexia nervosa. Obtain CBC, complete metabolic panel, and TSH. Recommend follow-up with psychiatry and possible admittance to eating disorder treatment program. Continue to monitor.

## 2017-01-01 NOTE — Assessment & Plan Note (Signed)
Hematuria appears resolved with no evidence of blood noted in urinalysis. No other urinary symptoms. No further treatment is necessary at this time. Follow-up if symptoms return.

## 2017-01-01 NOTE — Telephone Encounter (Signed)
Left msg on triage stating was asking about a high protein supplement w/[facility], and they advise her to contact her MD since she does have a eating disorder to recommend something that she can take that want suppress her appetite...[name]

## 2017-01-01 NOTE — Assessment & Plan Note (Signed)
New onset lump located at the 4 to 5:00 position breast. Obtain diagnostic mammogram and ultrasound. Follow-up pending results.

## 2017-01-01 NOTE — Assessment & Plan Note (Signed)
New onset vaginal discharge in the setting of previous antibiotic completion consistent with candidiasis vaginitis. Start Terazole 7. Follow-up if symptoms worsen or do not improve.

## 2017-01-01 NOTE — Patient Instructions (Signed)
Thank you for choosing [facility].  SUMMARY AND INSTRUCTIONS:  Please start the Terazol 7  We will check your blood work.  They will call to schedule your appointment for mammogram and ultrasound.  Medication:  Your prescription(s) have been submitted to your pharmacy or been printed and provided for you. Please take as directed and contact our office if you believe you are having problem(s) with the medication(s) or have any questions.  Labs:  Please stop by the lab on the lower level of the building for your blood work. Your results will be released to MyChart (or called to you) after review, usually within 72 hours after test completion. If any changes need to be made, you will be notified at that same time.  1.) The lab is open from 7:30am to 5:30 pm Monday-Friday 2.) No appointment is necessary 3.) Fasting (if needed) is 6-8 hours after food and drink; black coffee and water are okay   Follow up:  If your symptoms worsen or fail to improve, please contact our office for further instruction, or in case of emergency go directly to the emergency room at the closest medical facility.

## 2017-01-02 ENCOUNTER — Ambulatory Visit (HOSPITAL_COMMUNITY): Payer: Self-pay | Admitting: Psychiatry

## 2017-01-02 MED ORDER — VITAMIN D3 50 MCG (2000 UT) PO TABS
1.0000 | ORAL_TABLET | Freq: Every day | ORAL | 5 refills | Status: DC
Start: 2017-01-02 — End: 2018-08-13

## 2017-01-02 NOTE — Telephone Encounter (Signed)
Rec'd call pt requesting status from msg from yesterday, and also wanting refills on her Vit D3 and Lamictal. Sent vitamin D pls advise on lamictal & msg below...Karen Hahn

## 2017-01-02 NOTE — Telephone Encounter (Signed)
Notified Mellissa w/Greg response.../lmb 

## 2017-01-02 NOTE — Telephone Encounter (Signed)
Patient calling back in regard to lamictal.

## 2017-01-02 NOTE — Telephone Encounter (Signed)
I have not prescribed her lamictal which was previously prescribed by Dr. Michae Kava.

## 2017-01-03 ENCOUNTER — Other Ambulatory Visit (HOSPITAL_COMMUNITY): Payer: Self-pay

## 2017-01-03 DIAGNOSIS — F639 Impulse disorder, unspecified: Secondary | ICD-10-CM

## 2017-01-03 DIAGNOSIS — F332 Major depressive disorder, recurrent severe without psychotic features: Secondary | ICD-10-CM

## 2017-01-03 MED ORDER — LAMOTRIGINE 150 MG PO TABS: 300.0000 mg | ORAL_TABLET | Freq: Every day | ORAL | 1 refills | Status: DC

## 2017-01-07 ENCOUNTER — Ambulatory Visit
Admission: RE | Admit: 2017-01-07 | Discharge: 2017-01-07 | Disposition: A | Discharge status: Home / Self Care | Payer: Medicare Other | Source: Ambulatory Visit | Attending: Family | Admitting: Family

## 2017-01-07 ENCOUNTER — Other Ambulatory Visit: Payer: Self-pay | Admitting: Family

## 2017-01-07 ENCOUNTER — Ambulatory Visit (HOSPITAL_COMMUNITY): Payer: Self-pay | Admitting: Clinical

## 2017-01-07 DIAGNOSIS — N63 Unspecified lump in unspecified breast: Secondary | ICD-10-CM

## 2017-01-07 DIAGNOSIS — N6324 Unspecified lump in the left breast, lower inner quadrant: Secondary | ICD-10-CM | POA: Diagnosis not present

## 2017-01-07 DIAGNOSIS — R922 Inconclusive mammogram: Secondary | ICD-10-CM | POA: Diagnosis not present

## 2017-01-09 ENCOUNTER — Encounter (HOSPITAL_COMMUNITY): Payer: Self-pay | Admitting: Psychiatry

## 2017-01-09 ENCOUNTER — Ambulatory Visit (INDEPENDENT_AMBULATORY_CARE_PROVIDER_SITE_OTHER): Payer: Self-pay | Admitting: Psychiatry

## 2017-01-09 VITALS — BP 112/66 | HR 110 | Ht 63.0 in | Wt 101.8 lb

## 2017-01-09 DIAGNOSIS — F5105 Insomnia due to other mental disorder: Secondary | ICD-10-CM

## 2017-01-09 DIAGNOSIS — F639 Impulse disorder, unspecified: Secondary | ICD-10-CM

## 2017-01-09 DIAGNOSIS — F5000 Anorexia nervosa, unspecified: Secondary | ICD-10-CM

## 2017-01-09 DIAGNOSIS — Z811 Family history of alcohol abuse and dependence: Secondary | ICD-10-CM

## 2017-01-09 DIAGNOSIS — Z813 Family history of other psychoactive substance abuse and dependence: Secondary | ICD-10-CM

## 2017-01-09 DIAGNOSIS — F99 Mental disorder, not otherwise specified: Secondary | ICD-10-CM

## 2017-01-09 DIAGNOSIS — Z818 Family history of other mental and behavioral disorders: Secondary | ICD-10-CM

## 2017-01-09 DIAGNOSIS — F332 Major depressive disorder, recurrent severe without psychotic features: Secondary | ICD-10-CM

## 2017-01-09 DIAGNOSIS — F17210 Nicotine dependence, cigarettes, uncomplicated: Secondary | ICD-10-CM

## 2017-01-09 DIAGNOSIS — F411 Generalized anxiety disorder: Secondary | ICD-10-CM

## 2017-01-09 MED ORDER — LAMOTRIGINE 150 MG PO TABS: 300.0000 mg | ORAL_TABLET | Freq: Every day | ORAL | 1 refills | Status: DC

## 2017-01-09 NOTE — Progress Notes (Signed)
BH MD/PA/NP OP Progress Note  01/09/2017 9:03 AM Karen Hahn  MRN:  [id number]  Chief Complaint:  Chief Complaint    Follow-up      HPI: Pt never started the Selegine patch because her roommate was worried pt would abuse medication as she has in the past. This means pt can not take any antidepressants per her roommate. Pt states it is safe for her to take Lamictal because she has been on it for years and feels down when she misses a dose.  Pt reports depression is ongoing but she is dealing with it. Pt states she has no motivation and low energy. She is sleeping so/so. She spends her days going to NA meetings several days a week. She is taking an anger management class at the [facility] and states it is helping noticeably. Denies SI/HI.  She continues to pick at her face and states she is not able to control it.   She denies any recent impulsive behaviors and has been controlling her anger a little better.   Pt quit smoking THC 2 weeks ago. States the NA meetings are helping.  Pt is eating 3 meals a day and is trying to eat some more snacks. She is drinking [employer] for energy. She is thinking about starting some protein.   Taking meds as prescribed and denies SE.    Visit Diagnosis:    ICD-10-CM   1. GAD (generalized anxiety disorder) F41.1   2. Impulse control disorder F63.9 lamoTRIgine (LAMICTAL) 150 MG tablet  3. Severe episode of recurrent major depressive disorder, without psychotic features (HCC) F33.2 lamoTRIgine (LAMICTAL) 150 MG tablet  4. Anorexia nervosa F50.00   5. Insomnia due to other mental disorder F51.05    F99       Past Psychiatric History:  Anxiety: Yes Bipolar Disorder: No Depression: Yes Mania: No Psychosis: No Schizophrenia: No Personality Disorder: No Hospitalization for psychiatric illness: Yes numerous- 2 intensive eating program treatments in 2013 and 2014. BH eating d/o with Dr. Orland Dec Nov 2011. Multiple inpt hospitalizations at [facility] (2011, 2011,  2005), [place] (3x), [facility], [place], [place] (06/2013).  History of Electroconvulsive Shock Therapy: No Prior Suicide Attempts: No  Previous meds: Risperdal, Zyprexa, Seroquel, Paxil, Prozac, Remeron, Cymbalta  Past Medical History:  Past Medical History:  Diagnosis Date  . ADD (attention deficit disorder)   . Anorexia nervosa without bulimia   . Chronic constipation   . COPD (chronic obstructive pulmonary disease) (HCC)   . Drug abuse   . GAD (generalized anxiety disorder)   . GERD (gastroesophageal reflux disease)   . History of gastric restrictive surgery   . History of gastric ulcer   . History of Helicobacter pylori infection    after 2003  . Impulse control disorder   . Moderate major depression (HCC)   . Osteopenia   . Post concussion syndrome    fell 10/20/2015  . SMAS (superior mesenteric artery syndrome) (HCC)   . Urge urinary incontinence     Past Surgical History:  Procedure Laterality Date  . DILATION AND CURETTAGE OF UTERUS  2005  . INTERSTIM IMPLANT PLACEMENT N/A 10/18/2015   Procedure: [name] IMPLANT FIRST STAGE;  Surgeon: Jerilee Field, MD;  Location: [facility];  Service: Urology;  Laterality: N/A;  . INTERSTIM IMPLANT PLACEMENT N/A 10/18/2015   Procedure: [name] IMPLANT SECOND STAGE;  Surgeon: Jerilee Field, MD;  Location: [facility];  Service: Urology;  Laterality: N/A;  . LAPAROSCOPIC GASTRIC RESTRICTIVE DUODENAL  PROCEDURE (DUODENAL SWITCH)  12/ 2003  . ORIF RIGHT WRIST FX  2013   REMOVAL HARDWARE X2 in  2013--  No retained hardware    Family Psychiatric History:  Family History  Problem Relation Age of Onset  . Alcohol abuse Mother   . Anxiety disorder Mother   . Depression Mother        severe with hx of suicide attempt. treated with ECT  . COPD Mother   . Anxiety disorder Father   . Drug abuse Father   . COPD Father   . Alcohol abuse Maternal Uncle   . Anxiety disorder Maternal Grandmother   .  Depression Maternal Grandmother   . ADD / ADHD Brother   . Breast cancer Paternal Grandmother     Social History:  Social History   Social History  . Marital status: Divorced    Spouse name: N/A  . Number of children: 0  . Years of education: 14   Occupational History  . Disability    Social History Main Topics  . Smoking status: Current Every Day Smoker    Packs/day: 0.50    Years: 1.00    Types: Cigarettes  . Smokeless tobacco: Never Used  . Alcohol use No  . Drug use: No     Comment: hx of and last used cocaine 2012. she was smoking marjiuana 7g/week. smoking thru out the day but quit 2 weeks ago  . Sexual activity: Not Asked   Other Topics Concern  . None   Social History Narrative   Fun: Play piano; Build Lego   Denies abuse and feels safe at home     Allergies:  Allergies  Allergen Reactions  . Bactrim [Sulfamethoxazole-Trimethoprim] Other (See Comments)    Yeast infection  . Penicillins Other (See Comments)    Has patient had a PCN reaction causing immediate rash, facial/tongue/throat swelling, SOB or lightheadedness with hypotension: NO (UTI, YEAST INFECTION) Has patient had a PCN reaction causing severe rash involving mucus membranes or skin necrosis: NO Has patient had a PCN reaction that required hospitalization NO Has patient had a PCN reaction occurring within the last 10 years: NO If all of the above answers are "NO", then may proceed with Cephalosporin use.   . Ciprofloxacin Itching  . Morphine And Related Itching and Other (See Comments)    "whole body feels tight feeling"  . Reglan [Metoclopramide]   . Benadryl [Diphenhydramine Hcl] Palpitations  . Diflucan [Fluconazole] Rash    Metabolic Disorder Labs: No results found for: HGBA1C, MPG No results found for: PROLACTIN No results found for: CHOL, TRIG, HDL, CHOLHDL, VLDL, LDLCALC   Current Medications: Current Outpatient Prescriptions  Medication Sig Dispense Refill  . albuterol (PROAIR  HFA) 108 (90 Base) MCG/ACT inhaler Inhale 2 puffs into the lungs every 6 (six) hours as needed for wheezing or shortness of breath. 1 Inhaler 3  . Cholecalciferol (VITAMIN D3) 2000 units TABS Take 1 tablet by mouth daily. 30 tablet 5  . dicyclomine (BENTYL) 10 MG capsule Take 1 capsule (10 mg total) by mouth 4 (four) times daily -  before meals and at bedtime. 120 capsule 2  . lamoTRIgine (LAMICTAL) 150 MG tablet Take 2 tablets (300 mg total) by mouth daily. 60 tablet 1  . ondansetron (ZOFRAN) 8 MG tablet Take 1 tablet (8 mg total) by mouth 3 (three) times daily as needed for nausea or vomiting. 20 tablet 0  . pantoprazole (PROTONIX) 40 MG tablet Take 1 tablet (40 mg total)  by mouth every morning. 30 tablet 2  . promethazine (PHENERGAN) 25 MG tablet Take 25 mg by mouth every 6 (six) hours as needed for nausea or vomiting.    . ranitidine (ZANTAC) 150 MG tablet Take 1 tablet (150 mg total) by mouth every morning. 30 tablet 2  . terconazole (TERAZOL 7) 0.4 % vaginal cream Place 1 applicator vaginally at bedtime. 45 g 0   No current facility-administered medications for this visit.     Musculoskeletal: Strength & Muscle Tone: within normal limits Gait & Station: normal Patient leans: N/A  Psychiatric Specialty Exam: ROS  There were no vitals taken for this visit.There is no height or weight on file to calculate BMI.  General Appearance: Casual  Eye Contact:  Good  Speech:  Clear and Coherent and Normal Rate  Volume:  Normal  Mood:  Depressed  Affect:  Congruent  Thought Process:  Goal Directed and Descriptions of Associations: Intact  Orientation:  Full (Time, Place, and Person)  Thought Content: Logical   Suicidal Thoughts:  No  Homicidal Thoughts:  No  Memory:  Immediate;   Fair Recent;   Fair Remote;   Fair  Judgement:  Fair  Insight:  Fair  Psychomotor Activity:  Normal  Concentration:  Concentration: Fair and Attention Span: Fair  Recall:  [employer] of Knowledge: Good   Language: Good  Akathisia:  No  Handed:  Right  AIMS (if indicated):  n/a  Assets:  Communication Skills Desire for Improvement Housing Social Support  ADL's:  Intact  Cognition: WNL  Sleep:  poor     Treatment Plan Summary:Medication management   Assessment: Impulse control disorder Anorexia Nervosa Cannabis use disorder MDD-moderate, recurrent vs Bipolar II disorder GAD ADD-inattentive type Insomnia   Medication management with supportive therapy. Risks/benefits and SE of the medication discussed. Pt verbalized understanding and verbal consent obtained for treatment.  Affirm with the patient that the medications are taken as ordered. Patient expressed understanding of how their medications were to be used.   Meds: Lamictal 300mg  po qD for mood disorder   Labs: none    Therapy: brief supportive therapy provided. Discussed psychosocial stressors in detail.   Encouraged pt to develop daily routine and work on daily goal setting as a way to improve mood symptoms.    Consultations: continue individual therapy and NA meetings  Pt denies SI and is at an acute low risk for suicide. Patient told to call clinic if any problems occur. Patient advised to go to ER if they should develop SI/HI, side effects, or if symptoms worsen. Has crisis numbers to call if needed. Pt verbalized understanding.  F/up in 2 months or sooner if needed    Oletta Darter, MD 01/09/2017, 9:03 AM

## 2017-01-13 ENCOUNTER — Other Ambulatory Visit: Payer: Self-pay | Admitting: Family

## 2017-01-13 DIAGNOSIS — N63 Unspecified lump in unspecified breast: Secondary | ICD-10-CM

## 2017-01-14 ENCOUNTER — Ambulatory Visit (HOSPITAL_COMMUNITY): Payer: Self-pay | Admitting: Clinical

## 2017-01-30 ENCOUNTER — Telehealth: Payer: Self-pay | Admitting: Family

## 2017-01-30 NOTE — Telephone Encounter (Signed)
LVM letting pt know response below.  

## 2017-01-30 NOTE — Telephone Encounter (Signed)
Pt would like a call back regarding her teeth, she is in excruciating pain and she does not have dental coverage or a dentist, she feels like her tooth is cracking and she is wanting to know if a antibiotic can be called in  Please advise

## 2017-01-30 NOTE — Telephone Encounter (Signed)
It does not sound as though there is an infection that an antibiotic can assist with. I would recommend following up with a dentist through a cash pay visit. [facility] is available.

## 2017-01-31 DIAGNOSIS — R220 Localized swelling, mass and lump, head: Secondary | ICD-10-CM | POA: Diagnosis not present

## 2017-01-31 DIAGNOSIS — Z87898 Personal history of other specified conditions: Secondary | ICD-10-CM | POA: Diagnosis not present

## 2017-01-31 DIAGNOSIS — G501 Atypical facial pain: Secondary | ICD-10-CM | POA: Diagnosis not present

## 2017-03-06 ENCOUNTER — Encounter (HOSPITAL_COMMUNITY): Payer: Self-pay | Admitting: Psychiatry

## 2017-03-06 ENCOUNTER — Ambulatory Visit (INDEPENDENT_AMBULATORY_CARE_PROVIDER_SITE_OTHER): Payer: Medicare Other | Admitting: Psychiatry

## 2017-03-06 ENCOUNTER — Telehealth: Payer: Self-pay | Admitting: Family

## 2017-03-06 VITALS — BP 112/66 | HR 99 | Ht 62.75 in | Wt 106.8 lb

## 2017-03-06 DIAGNOSIS — Z87891 Personal history of nicotine dependence: Secondary | ICD-10-CM | POA: Diagnosis not present

## 2017-03-06 DIAGNOSIS — F988 Other specified behavioral and emotional disorders with onset usually occurring in childhood and adolescence: Secondary | ICD-10-CM | POA: Diagnosis not present

## 2017-03-06 DIAGNOSIS — F332 Major depressive disorder, recurrent severe without psychotic features: Secondary | ICD-10-CM

## 2017-03-06 DIAGNOSIS — G4700 Insomnia, unspecified: Secondary | ICD-10-CM

## 2017-03-06 DIAGNOSIS — F639 Impulse disorder, unspecified: Secondary | ICD-10-CM

## 2017-03-06 DIAGNOSIS — F5000 Anorexia nervosa, unspecified: Secondary | ICD-10-CM | POA: Diagnosis not present

## 2017-03-06 DIAGNOSIS — Z811 Family history of alcohol abuse and dependence: Secondary | ICD-10-CM | POA: Diagnosis not present

## 2017-03-06 DIAGNOSIS — F411 Generalized anxiety disorder: Secondary | ICD-10-CM | POA: Diagnosis not present

## 2017-03-06 DIAGNOSIS — Z818 Family history of other mental and behavioral disorders: Secondary | ICD-10-CM

## 2017-03-06 DIAGNOSIS — F99 Mental disorder, not otherwise specified: Secondary | ICD-10-CM | POA: Diagnosis not present

## 2017-03-06 DIAGNOSIS — Z813 Family history of other psychoactive substance abuse and dependence: Secondary | ICD-10-CM | POA: Diagnosis not present

## 2017-03-06 DIAGNOSIS — F5105 Insomnia due to other mental disorder: Secondary | ICD-10-CM | POA: Diagnosis not present

## 2017-03-06 MED ORDER — LAMOTRIGINE 150 MG PO TABS: 300.0000 mg | ORAL_TABLET | Freq: Every day | ORAL | 1 refills | Status: DC

## 2017-03-06 MED ORDER — PROMETHAZINE HCL 25 MG PO TABS: 25.0000 mg | ORAL_TABLET | Freq: Four times a day (QID) | ORAL | 1 refills | Status: DC | PRN

## 2017-03-06 MED ORDER — ONDANSETRON HCL 8 MG PO TABS: 8.0000 mg | ORAL_TABLET | Freq: Three times a day (TID) | ORAL | 0 refills | Status: DC | PRN

## 2017-03-06 NOTE — Telephone Encounter (Signed)
Pls advise if ok to refill../lmb 

## 2017-03-06 NOTE — Telephone Encounter (Signed)
Patient requesting refill on promethazine and ondansetron to be sent to [facility] on [address]  Also, requesting primary pharmacy to be changed to this pharmacy.

## 2017-03-06 NOTE — Telephone Encounter (Signed)
Medications sent to pharmacy

## 2017-03-06 NOTE — Progress Notes (Signed)
BH MD/PA/NP OP Progress Note  03/06/2017 11:39 AM Karen Hahn  MRN:  [id number]  Chief Complaint:  Chief Complaint    Follow-up      HPI: Pt's brother called her 3-4 days ago to tell pt that their mom is homeless and in an inpt psychiatric unit in [place]. Her mom called and left a voice message asking if she could come back to [place]. Pt then called her mom yesterday and told her no that was a not a possibility. Her mom causes pt a lot of anger, stress and irritability.   Pt was not able to sleep last night due to her stress.  Denies manic and hypomanic symptoms including periods of decreased need for sleep, increased energy, mood lability, impulsivity, FOI, and excessive spending.  Depression is a little better than it was. She thinks she is doing what she is supposed to do take care of herself. Pt denies SI/HI/AVH.  She is eating 3 meals a day but no snacks. Pt is not monitoring her weight and is not focused on portion size. Her mother has served as a Water quality scientist in the past.  She quit smoking THC 3 months ago. She is no longer popping pills. Pt is proud of her abstinence.   Visit Diagnosis:    ICD-10-CM   1. Anorexia nervosa F50.00   2. Impulse control disorder F63.9 lamoTRIgine (LAMICTAL) 150 MG tablet  3. Severe episode of recurrent major depressive disorder, without psychotic features (HCC) F33.2 lamoTRIgine (LAMICTAL) 150 MG tablet  4. GAD (generalized anxiety disorder) F41.1   5. Insomnia due to other mental disorder F51.05    F99   6. ADD (attention deficit disorder) without hyperactivity F98.8       Past Psychiatric History:  Anxiety:Yes Bipolar Disorder:No Depression:Yes Mania:No Psychosis:No Schizophrenia:No Personality Disorder:No Hospitalization for psychiatric illness:Yesnumerous- 2 intensive eating program treatments in 2013 and 2014. BH eating d/o with Dr. Orland Dec Nov 2011. Multiple inpt hospitalizations at [facility] (2011, 2011, 2005), [place] (3x), [facility], [place], [place] (06/2013).  History of Electroconvulsive Shock Therapy:No Prior Suicide Attempts:No Previous meds: Risperdal, Zyprexa, Seroquel, Paxil, Prozac, Remeron, Cymbalta   Past Medical History:  Past Medical History:  Diagnosis Date  . ADD (attention deficit disorder)   . Anorexia nervosa without bulimia   . Chronic constipation   . COPD (chronic obstructive pulmonary disease) (HCC)   . Drug abuse   . GAD (generalized anxiety disorder)   . GERD (gastroesophageal reflux disease)   . History of gastric restrictive surgery   . History of gastric ulcer   . History of Helicobacter pylori infection    after 2003  . Impulse control disorder   . Moderate major depression (HCC)   . Osteopenia   . Post concussion syndrome    fell 10/20/2015  . SMAS (superior mesenteric artery syndrome) (HCC)   . Urge urinary incontinence     Past Surgical History:  Procedure Laterality Date  . DILATION AND CURETTAGE OF UTERUS  2005  . INTERSTIM IMPLANT PLACEMENT N/A 10/18/2015   Procedure: [name] IMPLANT FIRST STAGE;  Surgeon: Jerilee Field, MD;  Location: [facility];  Service: Urology;  Laterality: N/A;  . INTERSTIM IMPLANT PLACEMENT N/A 10/18/2015   Procedure: [name] IMPLANT SECOND STAGE;  Surgeon: Jerilee Field, MD;  Location: [facility];  Service: Urology;  Laterality: N/A;  . LAPAROSCOPIC GASTRIC RESTRICTIVE DUODENAL PROCEDURE (DUODENAL SWITCH)  12/ 2003  . ORIF RIGHT WRIST FX  2013   REMOVAL HARDWARE X2  in  2013--  No retained hardware    Family Psychiatric History:  Family History  Problem Relation Age of Onset  . Alcohol abuse Mother   . Anxiety disorder Mother   . Depression Mother        severe with hx of suicide attempt. treated with ECT  . COPD Mother   . Anxiety disorder Father   . Drug abuse Father   . COPD Father   . Alcohol abuse Maternal Uncle   . Anxiety disorder Maternal Grandmother   . Depression Maternal  Grandmother   . ADD / ADHD Brother   . Breast cancer Paternal Grandmother     Social History:  Social History   Social History  . Marital status: Divorced    Spouse name: N/A  . Number of children: 0  . Years of education: 14   Occupational History  . Disability    Social History Main Topics  . Smoking status: Former Smoker    Packs/day: 0.50    Years: 1.00    Types: Cigarettes    Quit date: 12/03/2016  . Smokeless tobacco: Never Used  . Alcohol use No  . Drug use: No     Comment: hx of and last used cocaine 2012. she was smoking marjiuana 7g/week. smoking thru out the day but quit 2 weeks ago  . Sexual activity: Not Currently   Other Topics Concern  . None   Social History Narrative   Fun: Play piano; Build Lego   Denies abuse and feels safe at home     Allergies:  Allergies  Allergen Reactions  . Bactrim [Sulfamethoxazole-Trimethoprim] Other (See Comments)    Yeast infection  . Penicillins Other (See Comments)    Has patient had a PCN reaction causing immediate rash, facial/tongue/throat swelling, SOB or lightheadedness with hypotension: NO (UTI, YEAST INFECTION) Has patient had a PCN reaction causing severe rash involving mucus membranes or skin necrosis: NO Has patient had a PCN reaction that required hospitalization NO Has patient had a PCN reaction occurring within the last 10 years: NO If all of the above answers are "NO", then may proceed with Cephalosporin use.   . Ciprofloxacin Itching  . Morphine And Related Itching and Other (See Comments)    "whole body feels tight feeling"  . Reglan [Metoclopramide]   . Benadryl [Diphenhydramine Hcl] Palpitations  . Diflucan [Fluconazole] Rash    Metabolic Disorder Labs: No results found for: HGBA1C, MPG No results found for: PROLACTIN No results found for: CHOL, TRIG, HDL, CHOLHDL, VLDL, LDLCALC   Current Medications: Current Outpatient Prescriptions  Medication Sig Dispense Refill  . albuterol (PROAIR  HFA) 108 (90 Base) MCG/ACT inhaler Inhale 2 puffs into the lungs every 6 (six) hours as needed for wheezing or shortness of breath. 1 Inhaler 3  . Calcium-Iron-Vit D-Vit K (CALCIUM SOFT CHEWS) 500-08-998-40 MG-UNT-MCG CHEW Chew by mouth daily.    . Cholecalciferol (VITAMIN D3) 2000 units TABS Take 1 tablet by mouth daily. 30 tablet 5  . dicyclomine (BENTYL) 10 MG capsule Take 1 capsule (10 mg total) by mouth 4 (four) times daily -  before meals and at bedtime. 120 capsule 2  . lamoTRIgine (LAMICTAL) 150 MG tablet Take 2 tablets (300 mg total) by mouth daily. 60 tablet 1  . Multiple Vitamins-Minerals (MULTIVITAMIN GUMMIES ADULT) CHEW Chew by mouth.    . omega-3 fish oil (MAXEPA) 1000 MG CAPS capsule Take 1 capsule by mouth daily.    . ondansetron (ZOFRAN) 8 MG tablet Take  1 tablet (8 mg total) by mouth 3 (three) times daily as needed for nausea or vomiting. 20 tablet 0  . pantoprazole (PROTONIX) 40 MG tablet Take 1 tablet (40 mg total) by mouth every morning. 30 tablet 2  . promethazine (PHENERGAN) 25 MG tablet Take 25 mg by mouth every 6 (six) hours as needed for nausea or vomiting.    . ranitidine (ZANTAC) 150 MG tablet Take 1 tablet (150 mg total) by mouth every morning. 30 tablet 2  . terconazole (TERAZOL 7) 0.4 % vaginal cream Place 1 applicator vaginally at bedtime. (Patient not taking: Reported on 03/06/2017) 45 g 0   No current facility-administered medications for this visit.     Musculoskeletal: Strength & Muscle Tone: within normal limits Gait & Station: normal Patient leans: N/A  Psychiatric Specialty Exam: Review of Systems  Neurological: Positive for headaches. Negative for dizziness, tingling, tremors and sensory change.  Psychiatric/Behavioral: Positive for depression. Negative for hallucinations, substance abuse and suicidal ideas. The patient is nervous/anxious and has insomnia.     Blood pressure 112/66, pulse 99, height 5' 2.75" (1.594 m), weight 106 lb 12.8 oz (48.4 kg),  SpO2 96 %.Body mass index is 19.07 kg/m.  General Appearance: Casual  Eye Contact:  Good  Speech:  Clear and Coherent and Normal Rate  Volume:  Normal  Mood:  Anxious and Depressed  Affect:  Congruent  Thought Process:  Goal Directed and Descriptions of Associations: Intact  Orientation:  Full (Time, Place, and Person)  Thought Content: Logical   Suicidal Thoughts:  No  Homicidal Thoughts:  No  Memory:  Immediate;   Good Recent;   Good Remote;   Good  Judgement:  Good  Insight:  Good  Psychomotor Activity:  Normal  Concentration:  Concentration: Good and Attention Span: Good  Recall:  Good  Fund of Knowledge: Good  Language: Good  Akathisia:  No  Handed:  Right  AIMS (if indicated):  n/a  Assets:  Communication Skills Desire for Improvement Housing Social Support  ADL's:  Intact  Cognition: WNL  Sleep:  fair     Treatment Plan Summary:Medication management  Assessment: Impulse Control disorder Anorexia Nervosa MDD-recurrent, severe w/o psychotic features vs Bipolar II disoder GAD ADD-inattentive type Insomnia Cannabis use disordera   Medication management with supportive therapy. Risks/benefits and SE of the medication discussed. Pt verbalized understanding and verbal consent obtained for treatment.  Affirm with the patient that the medications are taken as ordered. Patient expressed understanding of how their medications were to be used.   Meds: Lamictal 300mg  po qD for mood disorder   Labs: none  Therapy: brief supportive therapy provided. Discussed psychosocial stressors in detail.   Encouraged pt to develop daily routine and work on daily goal setting as a way to improve mood symptoms.  Recommended pt stop all drug and alcohol use  Consultations: -encouraged to follow up with therapist   Pt denies SI and is at an acute low risk for suicide. Patient told to call clinic if any problems occur. Patient advised to go to ER if they should develop SI/HI, side  effects, or if symptoms worsen. Has crisis numbers to call if needed. Pt verbalized understanding.  F/up in 2 months or sooner if needed   , MD 03/06/2017, 11:39 AM

## 2017-03-07 NOTE — Telephone Encounter (Signed)
Notified pt refills has been sent.../lmb 

## 2017-04-04 ENCOUNTER — Other Ambulatory Visit: Payer: Self-pay | Admitting: Family

## 2017-04-04 ENCOUNTER — Other Ambulatory Visit (HOSPITAL_COMMUNITY): Payer: Self-pay | Admitting: Psychiatry

## 2017-04-04 DIAGNOSIS — F639 Impulse disorder, unspecified: Secondary | ICD-10-CM

## 2017-04-04 DIAGNOSIS — F332 Major depressive disorder, recurrent severe without psychotic features: Secondary | ICD-10-CM

## 2017-05-05 ENCOUNTER — Other Ambulatory Visit (HOSPITAL_COMMUNITY): Payer: Self-pay | Admitting: Psychiatry

## 2017-05-05 ENCOUNTER — Other Ambulatory Visit (HOSPITAL_COMMUNITY): Payer: Self-pay

## 2017-05-05 DIAGNOSIS — F332 Major depressive disorder, recurrent severe without psychotic features: Secondary | ICD-10-CM

## 2017-05-05 DIAGNOSIS — F639 Impulse disorder, unspecified: Secondary | ICD-10-CM

## 2017-05-05 MED ORDER — LAMOTRIGINE 150 MG PO TABS: 300.0000 mg | ORAL_TABLET | Freq: Every day | ORAL | 0 refills | Status: DC

## 2017-05-06 ENCOUNTER — Other Ambulatory Visit: Payer: Self-pay | Admitting: Family

## 2017-05-08 ENCOUNTER — Ambulatory Visit (HOSPITAL_COMMUNITY): Payer: Self-pay | Admitting: Psychiatry

## 2017-05-16 ENCOUNTER — Ambulatory Visit: Payer: Self-pay | Admitting: Family

## 2017-05-16 ENCOUNTER — Other Ambulatory Visit (HOSPITAL_COMMUNITY): Payer: Self-pay

## 2017-05-16 DIAGNOSIS — F639 Impulse disorder, unspecified: Secondary | ICD-10-CM

## 2017-05-16 DIAGNOSIS — F332 Major depressive disorder, recurrent severe without psychotic features: Secondary | ICD-10-CM

## 2017-05-16 MED ORDER — LAMOTRIGINE 150 MG PO TABS: 300.0000 mg | ORAL_TABLET | Freq: Every day | ORAL | 0 refills | Status: DC

## 2017-05-21 ENCOUNTER — Encounter: Payer: Self-pay | Admitting: Internal Medicine

## 2017-05-21 ENCOUNTER — Other Ambulatory Visit (INDEPENDENT_AMBULATORY_CARE_PROVIDER_SITE_OTHER): Payer: Medicare Other

## 2017-05-21 ENCOUNTER — Ambulatory Visit (INDEPENDENT_AMBULATORY_CARE_PROVIDER_SITE_OTHER): Payer: Medicare Other | Admitting: Internal Medicine

## 2017-05-21 VITALS — BP 116/70 | HR 90 | Temp 98.0°F | Ht 62.75 in | Wt 107.0 lb

## 2017-05-21 DIAGNOSIS — E559 Vitamin D deficiency, unspecified: Secondary | ICD-10-CM

## 2017-05-21 DIAGNOSIS — E538 Deficiency of other specified B group vitamins: Secondary | ICD-10-CM

## 2017-05-21 DIAGNOSIS — Z114 Encounter for screening for human immunodeficiency virus [HIV]: Secondary | ICD-10-CM | POA: Diagnosis not present

## 2017-05-21 DIAGNOSIS — N390 Urinary tract infection, site not specified: Secondary | ICD-10-CM

## 2017-05-21 DIAGNOSIS — E611 Iron deficiency: Secondary | ICD-10-CM

## 2017-05-21 DIAGNOSIS — R0600 Dyspnea, unspecified: Secondary | ICD-10-CM | POA: Diagnosis not present

## 2017-05-21 DIAGNOSIS — R5383 Other fatigue: Secondary | ICD-10-CM | POA: Diagnosis not present

## 2017-05-21 DIAGNOSIS — Z0001 Encounter for general adult medical examination with abnormal findings: Secondary | ICD-10-CM | POA: Insufficient documentation

## 2017-05-21 DIAGNOSIS — M8580 Other specified disorders of bone density and structure, unspecified site: Secondary | ICD-10-CM | POA: Insufficient documentation

## 2017-05-21 DIAGNOSIS — Z23 Encounter for immunization: Secondary | ICD-10-CM

## 2017-05-21 DIAGNOSIS — Z79899 Other long term (current) drug therapy: Secondary | ICD-10-CM

## 2017-05-21 DIAGNOSIS — Z0000 Encounter for general adult medical examination without abnormal findings: Secondary | ICD-10-CM

## 2017-05-21 DIAGNOSIS — B370 Candidal stomatitis: Secondary | ICD-10-CM | POA: Diagnosis not present

## 2017-05-21 LAB — CBC WITH DIFFERENTIAL/PLATELET
Basophils Absolute: 0 10*3/uL (ref 0.0–0.1)
Basophils Relative: 0.5 % (ref 0.0–3.0)
Eosinophils Absolute: 0.2 10*3/uL (ref 0.0–0.7)
Eosinophils Relative: 2 % (ref 0.0–5.0)
HCT: 40.1 % (ref 36.0–46.0)
Hemoglobin: 13.4 g/dL (ref 12.0–15.0)
Lymphocytes Relative: 24 % (ref 12.0–46.0)
Lymphs Abs: 2.4 10*3/uL (ref 0.7–4.0)
MCHC: 33.3 g/dL (ref 30.0–36.0)
MCV: 88.3 fl (ref 78.0–100.0)
Monocytes Absolute: 0.6 10*3/uL (ref 0.1–1.0)
Monocytes Relative: 6.3 % (ref 3.0–12.0)
Neutro Abs: 6.6 10*3/uL (ref 1.4–7.7)
Neutrophils Relative %: 67.2 % (ref 43.0–77.0)
Platelets: 262 10*3/uL (ref 150.0–400.0)
RBC: 4.54 Mil/uL (ref 3.87–5.11)
RDW: 12.5 % (ref 11.5–15.5)
WBC: 9.8 10*3/uL (ref 4.0–10.5)

## 2017-05-21 LAB — TSH: TSH: 3.05 u[IU]/mL (ref 0.35–4.50)

## 2017-05-21 MED ORDER — NYSTATIN 100000 UNIT/ML MT SUSP: 500000.0000 [IU] | Freq: Four times a day (QID) | OROMUCOSAL | 3 refills | Status: AC

## 2017-05-21 MED ORDER — PROMETHAZINE HCL 25 MG PO TABS: 25.0000 mg | ORAL_TABLET | Freq: Four times a day (QID) | ORAL | 5 refills | Status: DC | PRN

## 2017-05-21 NOTE — Assessment & Plan Note (Signed)
Possibly exac by recent antibx now completed, for nystatin soln asd

## 2017-05-21 NOTE — Assessment & Plan Note (Addendum)
Asympt, afeb, exam benign, ok for repeat Urine studies  Note:  Total time for pt hx, exam, review of record with pt in the room, determination of diagnoses and plan for further eval and tx is > 40 min, with over 50% spent in coordination and counseling of patient, including the differential dx, tx, further evaluation and other management of UTI, Vit D Deficiency, fatigue, osteopenia, dyspnea, thrush and fatigue

## 2017-05-21 NOTE — Assessment & Plan Note (Signed)

## 2017-05-21 NOTE — Patient Instructions (Addendum)
You will be contacted regarding the referral for: Lung testing  You had the Tdap tetanus shot today  Please take all new medication as prescribed - the nystatin solution for the mouth  Please continue all other medications as before, and refills have been done if requested - the phenergan  Please have the pharmacy call with any other refills you may need.  Please continue your efforts at being more active, low cholesterol diet, and weight control.  You are otherwise up to date with prevention measures today.  Please keep your appointments with your specialists as you may have planned  Please go to the LAB in the Basement (turn left off the elevator) for the tests to be done today  You will be contacted by phone if any changes need to be made immediately.  Otherwise, you will receive a letter about your results with an explanation, but please check with MyChart first.  Please remember to sign up for MyChart if you have not done so, as this will be important to you in the future with finding out test results, communicating by private email, and scheduling acute appointments online when needed.  Please return in 1 year for your yearly visit, or sooner if needed, with Lab testing done 3-5 days before

## 2017-05-21 NOTE — Assessment & Plan Note (Addendum)
Has been on longer course of oral tx recently, for recheck level today

## 2017-05-21 NOTE — Progress Notes (Signed)
Subjective:    Patient ID: Karen Hahn, female    DOB: 12-10-1975, 41 y.o.   MRN: [id number]  HPI  Here for wellness and f/u;  Overall doing ok;  Pt denies Chest pain, wheezing, orthopnea, PND, worsening LE edema, palpitations, dizziness or syncope though does have some ongoing sob with listed hx of COPD, still needs PFT's done.  Pt denies neurological change such as new headache, facial or extremity weakness.  Pt denies polydipsia, polyuria, or low sugar symptoms. Pt states overall good compliance with treatment and medications, good tolerability, and has been trying to follow appropriate diet.  Pt denies worsening depressive symptoms, suicidal ideation or panic. No fever, night sweats, wt loss, loss of appetite, or other constitutional symptoms.  Pt states good ability with ADL's, has low fall risk, home safety reviewed and adequate, no other significant changes in hearing or vision, and not active with exercise, but also cannot gain wt, has hx of eating disorder.  Pt is former smoker, only smoked 1/2 ppd for 4 yrs only. Declines flu shot, ok for Tdap.   Does have white coating to dorsal tongue for several months, bad taste in the mouth but no pain or mouth ulcers.  Was tx for UTI recently and asks for f/u studies - Denies urinary symptoms such as dysuria, frequency, urgency, flank pain, hematuria or n/v, fever, chills.  Does c/o ongoing fatigue, but denies signficant daytime hypersomnolence.  Has ongoing difficulty with getting to sleep many nights and staying asleep.   Past Medical History:  Diagnosis Date  . ADD (attention deficit disorder)   . Anorexia nervosa without bulimia   . Chronic constipation   . COPD (chronic obstructive pulmonary disease) (HCC)   . Drug abuse (HCC)   . GAD (generalized anxiety disorder)   . GERD (gastroesophageal reflux disease)   . History of gastric restrictive surgery   . History of gastric ulcer   . History of Helicobacter pylori infection    after 2003  .  Impulse control disorder   . Moderate major depression (HCC)   . Osteopenia   . Post concussion syndrome    fell 10/20/2015  . SMAS (superior mesenteric artery syndrome) (HCC)   . Urge urinary incontinence    Past Surgical History:  Procedure Laterality Date  . DILATION AND CURETTAGE OF UTERUS  2005  . INTERSTIM IMPLANT PLACEMENT N/A 10/18/2015   Procedure: [name] IMPLANT FIRST STAGE;  Surgeon: Jerilee Field, MD;  Location: [facility];  Service: Urology;  Laterality: N/A;  . INTERSTIM IMPLANT PLACEMENT N/A 10/18/2015   Procedure: [name] IMPLANT SECOND STAGE;  Surgeon: Jerilee Field, MD;  Location[facility];  Service: Urology;  Laterality: N/A;  . LAPAROSCOPIC GASTRIC RESTRICTIVE DUODENAL PROCEDURE (DUODENAL SWITCH)  12/ 2003  . ORIF RIGHT WRIST FX  2013   REMOVAL HARDWARE X2 in  2013--  No retained hardware    reports that she quit smoking about 5 months ago. Her smoking use included Cigarettes. She has a 0.50 pack-year smoking history. She has never used smokeless tobacco. She reports that she does not drink alcohol or use drugs. family history includes ADD / ADHD in her brother; Alcohol abuse in her maternal uncle and mother; Anxiety disorder in her father, maternal grandmother, and mother; Breast cancer in her paternal grandmother; COPD in her father and mother; Depression in her maternal grandmother and mother; Drug abuse in her father. Allergies  Allergen Reactions  . Bactrim [Sulfamethoxazole-Trimethoprim] Other (See Comments)  Yeast infection  . Penicillins Other (See Comments)    Has patient had a PCN reaction causing immediate rash, facial/tongue/throat swelling, SOB or lightheadedness with hypotension: NO (UTI, YEAST INFECTION) Has patient had a PCN reaction causing severe rash involving mucus membranes or skin necrosis: NO Has patient had a PCN reaction that required hospitalization NO Has patient had a PCN reaction occurring within  the last 10 years: NO If all of the above answers are "NO", then may proceed with Cephalosporin use.   . Ciprofloxacin Itching  . Morphine And Related Itching and Other (See Comments)    "whole body feels tight feeling"  . Reglan [Metoclopramide]   . Benadryl [Diphenhydramine Hcl] Palpitations  . Diflucan [Fluconazole] Rash   Current Outpatient Prescriptions on File Prior to Visit  Medication Sig Dispense Refill  . albuterol (PROAIR HFA) 108 (90 Base) MCG/ACT inhaler Inhale 2 puffs into the lungs every 6 (six) hours as needed for wheezing or shortness of breath. 1 Inhaler 3  . Calcium-Iron-Vit D-Vit K (CALCIUM SOFT CHEWS) [id number] MG-UNT-MCG CHEW Chew by mouth daily.    . Cholecalciferol (VITAMIN D3) 2000 units TABS Take 1 tablet by mouth daily. 30 tablet 5  . dicyclomine (BENTYL) 10 MG capsule TAKE 1 CAPSULE BY MOUTH 4 TIMES A DAY BEFORE MEALS AND AT BEDTIME 120 capsule 0  . lamoTRIgine (LAMICTAL) 150 MG tablet Take 2 tablets (300 mg total) by mouth daily. 60 tablet 0  . Multiple Vitamins-Minerals (MULTIVITAMIN GUMMIES ADULT) CHEW Chew by mouth.    . omega-3 fish oil (MAXEPA) 1000 MG CAPS capsule Take 1 capsule by mouth daily.    . pantoprazole (PROTONIX) 40 MG tablet TAKE 1 TABLET BY MOUTH EVERY MORNING 90 tablet 0  . ranitidine (ZANTAC) 150 MG tablet TAKE 1 TABLET BY MOUTH EVERY MORNING 90 tablet 0  . terconazole (TERAZOL 7) 0.4 % vaginal cream Place 1 applicator vaginally at bedtime. 45 g 0   No current facility-administered medications on file prior to visit.     Review of Systems Constitutional: Negative for other unusual diaphoresis, sweats, appetite or weight changes HENT: Negative for other worsening hearing loss, ear pain, facial swelling, mouth sores or neck stiffness.   Eyes: Negative for other worsening pain, redness or other visual disturbance.  Respiratory: Negative for other stridor or swelling Cardiovascular: Negative for other palpitations or other chest pain    Gastrointestinal: Negative for worsening diarrhea or loose stools, blood in stool, distention or other pain Genitourinary: Negative for hematuria, flank pain or other change in urine volume.  Musculoskeletal: Negative for myalgias or other joint swelling.  Skin: Negative for other color change, or other wound or worsening drainage.  Neurological: Negative for other syncope or numbness. Hematological: Negative for other adenopathy or swelling Psychiatric/Behavioral: Negative for hallucinations, other worsening agitation, SI, self-injury, or new decreased concentration All other system neg per pt    Objective:   Physical Exam BP 116/70   Pulse 90   Temp 98 F (36.7 C) (Oral)   Ht 5' 2.75" (1.594 m)   Wt 107 lb (48.5 kg)   SpO2 99%   BMI 19.11 kg/m  VS noted, thin for height, not ill appaering Constitutional: Pt is oriented to person, place, and time. Appears well-developed and well-nourished, in no significant distress and comfortable Head: Normocephalic and atraumatic  Eyes: Conjunctivae and EOM are normal. Pupils are equal, round, and reactive to light Right Ear: External ear normal without discharge Left Ear: External ear normal without discharge Nose: Nose without  discharge or deformity Mouth/Throat: Oropharynx is without other ulcerations and moist, tongue with dorsal white coating  Neck: Normal range of motion. Neck supple. No JVD present. No tracheal deviation present or significant neck LA or mass Cardiovascular: Normal rate, regular rhythm, normal heart sounds and intact distal pulses.   Pulmonary/Chest: WOB normal and breath sounds without rales or wheezing  Abdominal: Soft. Bowel sounds are normal. NT. No HSM  Musculoskeletal: Normal range of motion. Exhibits no edema Lymphadenopathy: Has no other cervical adenopathy.  Neurological: Pt is alert and oriented to person, place, and time. Pt has normal reflexes. No cranial nerve deficit. Motor grossly intact, Gait  intact Skin: Skin is warm and dry. No rash noted or new ulcerations Psychiatric:  Has normal mood and affect. Behavior is normal without agitation No other exam findings Lab Results  Component Value Date   WBC 6.2 01/01/2017   HGB 13.3 01/01/2017   HCT 39.0 01/01/2017   PLT 266.0 01/01/2017   GLUCOSE 96 01/01/2017   ALT 14 01/01/2017   AST 16 01/01/2017   NA 137 01/01/2017   K 4.8 01/01/2017   CL 103 01/01/2017   CREATININE 0.77 01/01/2017   BUN 12 01/01/2017   CO2 31 01/01/2017   TSH 1.59 01/01/2017          Assessment & Plan:

## 2017-05-21 NOTE — Assessment & Plan Note (Signed)
Exam benign, for PFT's as planned

## 2017-05-21 NOTE — Assessment & Plan Note (Signed)
Also for PTH level but osteopenia premenopausal more likely related to nutritional deficiency in past

## 2017-05-21 NOTE — Assessment & Plan Note (Signed)
Encourage good sleep hygeine measures

## 2017-05-22 ENCOUNTER — Telehealth: Payer: Self-pay | Admitting: Internal Medicine

## 2017-05-22 LAB — URINALYSIS, ROUTINE W REFLEX MICROSCOPIC
Bilirubin Urine: NEGATIVE
Hgb urine dipstick: NEGATIVE
Ketones, ur: NEGATIVE
Leukocytes, UA: NEGATIVE
Nitrite: NEGATIVE
RBC / HPF: NONE SEEN (ref 0–?)
Specific Gravity, Urine: 1.01 (ref 1.000–1.030)
Total Protein, Urine: NEGATIVE
Urine Glucose: NEGATIVE
Urobilinogen, UA: 0.2 (ref 0.0–1.0)
WBC, UA: NONE SEEN (ref 0–?)
pH: 7 (ref 5.0–8.0)

## 2017-05-22 LAB — LIPID PANEL
Cholesterol: 216 mg/dL — ABNORMAL HIGH (ref 0–200)
HDL: 59.8 mg/dL (ref >39.00)
LDL Cholesterol: 139 mg/dL — ABNORMAL HIGH (ref 0–99)
NonHDL: 156.31
Total CHOL/HDL Ratio: 4
Triglycerides: 87 mg/dL (ref 0.0–149.0)
VLDL: 17.4 mg/dL (ref 0.0–40.0)

## 2017-05-22 LAB — IBC PANEL
Iron: 120 ug/dL (ref 42–145)
Saturation Ratios: 30.5 % (ref 20.0–50.0)
Transferrin: 281 mg/dL (ref 212.0–360.0)

## 2017-05-22 LAB — BASIC METABOLIC PANEL
BUN: 17 mg/dL (ref 6–23)
CO2: 24 mEq/L (ref 19–32)
Calcium: 9.8 mg/dL (ref 8.4–10.5)
Chloride: 100 mEq/L (ref 96–112)
Creatinine, Ser: 0.85 mg/dL (ref 0.40–1.20)
GFR: 79.21 mL/min (ref >60.00)
Glucose, Bld: 87 mg/dL (ref 70–99)
Potassium: 4.2 mEq/L (ref 3.5–5.1)
Sodium: 138 mEq/L (ref 135–145)

## 2017-05-22 LAB — VITAMIN D 25 HYDROXY (VIT D DEFICIENCY, FRACTURES): VITD: 38.83 ng/mL (ref 30.00–100.00)

## 2017-05-22 LAB — HEPATIC FUNCTION PANEL
ALT: 13 U/L (ref 0–35)
AST: 16 U/L (ref 0–37)
Albumin: 4.9 g/dL (ref 3.5–5.2)
Alkaline Phosphatase: 63 U/L (ref 39–117)
Bilirubin, Direct: 0 mg/dL (ref 0.0–0.3)
Total Bilirubin: 0.5 mg/dL (ref 0.2–1.2)
Total Protein: 8.1 g/dL (ref 6.0–8.3)

## 2017-05-22 LAB — URINE CULTURE
MICRO NUMBER:: 81159370
Result:: NO GROWTH
SPECIMEN QUALITY:: ADEQUATE

## 2017-05-22 LAB — HIV ANTIBODY (ROUTINE TESTING W REFLEX): HIV 1&2 Ab, 4th Generation: NONREACTIVE

## 2017-05-22 LAB — VITAMIN B12: Vitamin B-12: 932 pg/mL — ABNORMAL HIGH (ref 211–911)

## 2017-05-22 NOTE — Telephone Encounter (Signed)
Patient is requesting a call back in regard to cholesterol levels.  Patient would like to know if she needs to be concerned.

## 2017-05-22 NOTE — Telephone Encounter (Signed)
Lab Results  Component Value Date   LDLCALC 139 (H) 05/21/2017  Cholesterol is considered mildly elevated with the goal LDL being to be less than 397.    Unless there is Family History of close relatives with early heart disease or stroke (like in their 20's), she woud not need statin tx at this time  Please follow a lower chol diet

## 2017-05-23 NOTE — Telephone Encounter (Signed)
Called pt, LVM.   

## 2017-05-26 MED ORDER — ROSUVASTATIN CALCIUM 10 MG PO TABS: 10.0000 mg | ORAL_TABLET | Freq: Every day | ORAL | 3 refills | Status: DC

## 2017-05-26 NOTE — Telephone Encounter (Addendum)
Called pt, LVM informing her Lennon Alstrom is out of the office this week but I will forward her concerns to him and be back in touch with her the beginning of next week.

## 2017-05-26 NOTE — Addendum Note (Signed)
Addended by: Corwin Levins on: 05/26/2017 09:11 PM   Modules accepted: Orders

## 2017-05-26 NOTE — Telephone Encounter (Signed)
Patient called back, follow up once you have a chance.   She did state her Mom had a stoke at 14 and is on medication. She states her parents said that it runs in the family.

## 2017-05-26 NOTE — Telephone Encounter (Signed)
Ok for statin  - crestor 10 qd done erx

## 2017-05-27 NOTE — Telephone Encounter (Signed)
Called pt, LVM with details below  

## 2017-06-06 ENCOUNTER — Other Ambulatory Visit: Payer: Self-pay | Admitting: *Deleted

## 2017-06-06 MED ORDER — DICYCLOMINE HCL 10 MG PO CAPS: ORAL_CAPSULE | ORAL | 0 refills | Status: DC

## 2017-06-06 MED ORDER — PANTOPRAZOLE SODIUM 40 MG PO TBEC: 40.0000 mg | DELAYED_RELEASE_TABLET | Freq: Every morning | ORAL | 3 refills | Status: DC

## 2017-06-06 NOTE — Addendum Note (Signed)
Addended by: Deatra James on: 06/06/2017 03:21 PM   Modules accepted: Orders

## 2017-06-09 ENCOUNTER — Encounter: Payer: Self-pay | Admitting: Internal Medicine

## 2017-06-11 ENCOUNTER — Encounter: Payer: Self-pay | Admitting: Internal Medicine

## 2017-06-12 ENCOUNTER — Ambulatory Visit (INDEPENDENT_AMBULATORY_CARE_PROVIDER_SITE_OTHER): Payer: Medicare Other | Admitting: Psychiatry

## 2017-06-12 ENCOUNTER — Encounter (HOSPITAL_COMMUNITY): Payer: Self-pay | Admitting: Psychiatry

## 2017-06-12 VITALS — BP 110/68 | HR 96 | Ht 63.0 in | Wt 107.6 lb

## 2017-06-12 DIAGNOSIS — F99 Mental disorder, not otherwise specified: Secondary | ICD-10-CM

## 2017-06-12 DIAGNOSIS — F639 Impulse disorder, unspecified: Secondary | ICD-10-CM | POA: Diagnosis not present

## 2017-06-12 DIAGNOSIS — F5000 Anorexia nervosa, unspecified: Secondary | ICD-10-CM | POA: Diagnosis not present

## 2017-06-12 DIAGNOSIS — F5105 Insomnia due to other mental disorder: Secondary | ICD-10-CM

## 2017-06-12 DIAGNOSIS — F332 Major depressive disorder, recurrent severe without psychotic features: Secondary | ICD-10-CM | POA: Diagnosis not present

## 2017-06-12 DIAGNOSIS — Z79899 Other long term (current) drug therapy: Secondary | ICD-10-CM

## 2017-06-12 DIAGNOSIS — F411 Generalized anxiety disorder: Secondary | ICD-10-CM | POA: Diagnosis not present

## 2017-06-12 DIAGNOSIS — Z87891 Personal history of nicotine dependence: Secondary | ICD-10-CM

## 2017-06-12 MED ORDER — LAMOTRIGINE 150 MG PO TABS: 300.0000 mg | ORAL_TABLET | Freq: Every day | ORAL | 3 refills | Status: DC

## 2017-06-12 NOTE — Progress Notes (Signed)
BH MD/PA/NP OP Progress Note  06/12/2017 11:57 AM Karen Hahn  MRN:  [id number]  Chief Complaint:  Chief Complaint    Follow-up     HPI: Pt states she bought a new car yesterday and is very happy.  Pt has cut off all contract with her mother earlier this month. Pt states it feels good. Pt is trying to enjoy herself and connecting with old family member.  Depression has improved since she got the car yesterday. Sleep is poor and her cat often wakes her up. Pt feels very supported by her dog. Pt denies worthlessness and hopelessness. Pt denies SI/HI.  Pt is eating 3 meals a day. She is did well in September and October which are usually tough months for her.   Pt is having some conflict with her roommate and best friend. Pt reports her irritability has improved but she does get aggravated in some situations or randomly. She usually does feel irritability after a few hours in the morning and it can last all day. Pt is going to start anger management classes again.   Anxiety is unchanged. She continues to pick at her skin.   Pt denies manic and hypomanic symptoms including periods of decreased need for sleep, increased energy, mood lability, impulsivity, FOI, and excessive spending.  Pt states-taking meds as prescribed and denies SE.    Visit Diagnosis:    ICD-10-CM   1. Anorexia nervosa F50.00   2. Severe episode of recurrent major depressive disorder, without psychotic features (HCC) F33.2 lamoTRIgine (LAMICTAL) 150 MG tablet  3. Impulse control disorder F63.9 lamoTRIgine (LAMICTAL) 150 MG tablet  4. GAD (generalized anxiety disorder) F41.1   5. Insomnia due to other mental disorder F51.05    F99       Past Psychiatric History:  Anxiety: Yes Bipolar Disorder: No Depression: Yes Mania: No Psychosis: No Schizophrenia: No Personality Disorder: No Hospitalization for psychiatric illness: Yes numerous- 2 intensive eating program treatments in 2013 and 2014. BH eating d/o  with Dr. Orland DecQuails Nov 2011. [facility], [facility] (06/[facility]).  History of Electroconvulsive Shock Therapy: No Prior Suicide Attempts: No  Previous meds: Risperdal, Zyprexa, Seroquel, Paxil, Prozac, Remeron, Cymbalta   Past Medical History:  Past Medical History:  Diagnosis Date  . ADD (attention deficit disorder)   . Anorexia nervosa without bulimia   . Chronic constipation   . COPD (chronic obstructive pulmonary disease) (HCC)   . Drug abuse (HCC)   . GAD (generalized anxiety disorder)   . GERD (gastroesophageal reflux disease)   . History of gastric restrictive surgery   . History of gastric ulcer   . History of Helicobacter pylori infection    after 2003  . Impulse control disorder   . Moderate major depression (HCC)   . Osteopenia   . Post concussion syndrome    fell 10/20/2015  . SMAS (superior mesenteric artery syndrome) (HCC)   . Urge urinary incontinence     Past Surgical History:  Procedure Laterality Date  . DILATION AND CURETTAGE OF UTERUS  2005  . LAPAROSCOPIC GASTRIC RESTRICTIVE DUODENAL PROCEDURE (DUODENAL SWITCH)  12/ 2003  . ORIF RIGHT WRIST FX  2013   REMOVAL HARDWARE X2 in  2013--  No retained hardware    Family Psychiatric History:  Family History  Problem Relation Age of Onset  . Alcohol abuse Mother   . Anxiety disorder Mother   . Depression Mother  severe with hx of suicide attempt. treated with ECT  . COPD Mother   . Anxiety disorder Father   . Drug abuse Father   . COPD Father   . Alcohol abuse Maternal Uncle   . Anxiety disorder Maternal Grandmother   . Depression Maternal Grandmother   . ADD / ADHD Brother   . Breast cancer Paternal Grandmother     Social History:  Social History   Socioeconomic History  . Marital status: Divorced    Spouse name: None  . Number of children: 0  . Years of education: 66  . Highest education level: None  Social  Needs  . Financial resource strain: None  . Food insecurity - worry: None  . Food insecurity - inability: None  . Transportation needs - medical: Yes  . Transportation needs - non-medical: Yes  Occupational History  . Occupation: Disability  Tobacco Use  . Smoking status: Former Smoker    Packs/day: 0.50    Years: 1.00    Pack years: 0.50    Types: Cigarettes    Last attempt to quit: 12/03/2016    Years since quitting: 0.5  . Smokeless tobacco: Never Used  Substance and Sexual Activity  . Alcohol use: No    Alcohol/week: 0.0 oz  . Drug use: No    Comment: hx of and last used cocaine 2012. she was smoking marjiuana 7g/week. smoking thru out the day but quit 4 months ago  . Sexual activity: Not Currently  Other Topics Concern  . None  Social History Narrative   Fun: Play piano; Build Lego   Denies abuse and feels safe at home. Living with friend in [place].     Allergies:  Allergies  Allergen Reactions  . Bactrim [Sulfamethoxazole-Trimethoprim] Other (See Comments)    Yeast infection  . Penicillins Other (See Comments)    Has patient had a PCN reaction causing immediate rash, facial/tongue/throat swelling, SOB or lightheadedness with hypotension: NO (UTI, YEAST INFECTION) Has patient had a PCN reaction causing severe rash involving mucus membranes or skin necrosis: NO Has patient had a PCN reaction that required hospitalization NO Has patient had a PCN reaction occurring within the last 10 years: NO If all of the above answers are "NO", then may proceed with Cephalosporin use.   . Ciprofloxacin Itching  . Morphine And Related Itching and Other (See Comments)    "whole body feels tight feeling"  . Reglan [Metoclopramide]   . Benadryl [Diphenhydramine Hcl] Palpitations  . Diflucan [Fluconazole] Rash    Metabolic Disorder Labs: No results found for: HGBA1C, MPG No results found for: PROLACTIN Lab Results  Component Value Date   CHOL 216 (H) 05/21/2017   TRIG 87.0  05/21/2017   HDL 59.80 05/21/2017   CHOLHDL 4 05/21/2017   VLDL 17.4 05/21/2017   LDLCALC 139 (H) 05/21/2017   Lab Results  Component Value Date   TSH 3.05 05/21/2017   TSH 1.59 01/01/2017    Therapeutic Level Labs: No results found for: LITHIUM No results found for: VALPROATE No components found for:  CBMZ  Current Medications: Current Outpatient Medications  Medication Sig Dispense Refill  . albuterol (PROAIR HFA) 108 (90 Base) MCG/ACT inhaler Inhale 2 puffs into the lungs every 6 (six) hours as needed for wheezing or shortness of breath. 1 Inhaler 3  . Calcium-Iron-Vit D-Vit K (CALCIUM SOFT CHEWS) 500-08-998-40 MG-UNT-MCG CHEW Chew by mouth daily.    . Cholecalciferol (VITAMIN D3) 2000 units TABS Take 1 tablet by mouth daily.  30 tablet 5  . dicyclomine (BENTYL) 10 MG capsule TAKE 1 CAPSULE BY MOUTH 4 TIMES A DAY BEFORE MEALS AND AT BEDTIME 120 capsule 0  . lamoTRIgine (LAMICTAL) 150 MG tablet Take 2 tablets (300 mg total) daily by mouth. 60 tablet 3  . Multiple Vitamins-Minerals (MULTIVITAMIN GUMMIES ADULT) CHEW Chew by mouth.    . omega-3 fish oil (MAXEPA) 1000 MG CAPS capsule Take 1 capsule by mouth daily.    . pantoprazole (PROTONIX) 40 MG tablet Take 1 tablet (40 mg total) by mouth every morning. 90 tablet 3  . promethazine (PHENERGAN) 25 MG tablet Take 1 tablet (25 mg total) by mouth every 6 (six) hours as needed for nausea or vomiting. 30 tablet 5  . ranitidine (ZANTAC) 150 MG tablet TAKE 1 TABLET BY MOUTH EVERY MORNING 90 tablet 0  . rosuvastatin (CRESTOR) 10 MG tablet Take 1 tablet (10 mg total) by mouth daily. 90 tablet 3  . terconazole (TERAZOL 7) 0.4 % vaginal cream Place 1 applicator vaginally at bedtime. (Patient not taking: Reported on 06/12/2017) 45 g 0   No current facility-administered medications for this visit.      Musculoskeletal: Strength & Muscle Tone: within normal limits Gait & Station: normal Patient leans: N/A  Psychiatric Specialty Exam: Review  of Systems  Constitutional: Negative for chills, diaphoresis and fever.  Gastrointestinal: Negative for abdominal pain, heartburn, nausea and vomiting.  Neurological: Negative for weakness.    Blood pressure 110/68, pulse 96, height 5\' 3"  (1.6 m), weight 107 lb 9.6 oz (48.8 kg).Body mass index is 19.06 kg/m.  General Appearance: Fairly Groomed  Eye Contact:  Good  Speech:  Clear and Coherent and Normal Rate  Volume:  Normal  Mood:  Anxious  Affect:  Congruent  Thought Process:  Coherent and Descriptions of Associations: Circumstantial  Orientation:  Full (Time, Place, and Person)  Thought Content: Rumination   Suicidal Thoughts:  No  Homicidal Thoughts:  No  Memory:  Immediate;   Good Recent;   Good Remote;   Good  Judgement:  Fair  Insight:  Fair  Psychomotor Activity:  Normal  Concentration:  Concentration: Good and Attention Span: Good  Recall:  Good  Fund of Knowledge: Good  Language: Good  Akathisia:  No  Handed:  Right  AIMS (if indicated): not done  Assets:  Communication Skills Desire for Improvement Financial Resources/Insurance Housing Social Support Transportation  ADL's:  Intact  Cognition: WNL  Sleep:  Fair   Screenings: PHQ2-9     Office Visit from 11/14/2016 in [facility]  PHQ-2 Total Score  0       Assessment and Plan: MDD-recurrent, severe without psychotic features vs Bipolar II disorder Anorexia Nervosa Impulse control disorder GAD ADHD-inattentive type Insomnia Cannabis use disorder- in remission    Medication management with supportive therapy. Risks/benefits and SE of the medication discussed. Pt verbalized understanding and verbal consent obtained for treatment.  Affirm with the patient that the medications are taken as ordered. Patient expressed understanding of how their medications were to be used.   Meds: Lamictal 300mg  po qD for mood disoder   Labs: none  Therapy: brief supportive therapy provided.  Discussed psychosocial stressors in detail.   Encouraged pt to develop daily routine and work on daily goal setting as a way to improve mood symptoms.  Recommended pt stop all drug and alcohol use  Consultations: Encouraged to follow up with therapist Encouraged to follow up with PCP as needed  Pt denies  SI and is at an acute low risk for suicide. Patient told to call clinic if any problems occur. Patient advised to go to ER if they should develop SI/HI, side effects, or if symptoms worsen. Has crisis numbers to call if needed. Pt verbalized understanding.  F/up in 2 months or sooner if needed    Oletta Darter, MD 06/12/2017, 11:57 AM

## 2017-06-17 ENCOUNTER — Ambulatory Visit (INDEPENDENT_AMBULATORY_CARE_PROVIDER_SITE_OTHER): Payer: Medicare Other | Admitting: Internal Medicine

## 2017-06-17 DIAGNOSIS — R0600 Dyspnea, unspecified: Secondary | ICD-10-CM | POA: Diagnosis not present

## 2017-06-17 LAB — PULMONARY FUNCTION TEST
DL/VA % pred: 91 %
DL/VA: 4.27 ml/min/mmHg/L
DLCO cor % pred: 95 %
DLCO cor: 21.92 ml/min/mmHg
DLCO unc % pred: 99 %
DLCO unc: 22.81 ml/min/mmHg
FEF 25-75 Post: 3.6 L/sec
FEF 25-75 Pre: 3.67 L/sec
FEF2575-%Change-Post: -1 %
FEF2575-%Pred-Post: 115 %
FEF2575-%Pred-Pre: 117 %
FEV1-%Change-Post: 0 %
FEV1-%Pred-Post: 109 %
FEV1-%Pred-Pre: 109 %
FEV1-Post: 3.22 L
FEV1-Pre: 3.23 L
FEV1FVC-%Change-Post: 3 %
FEV1FVC-%Pred-Pre: 100 %
FEV6-%Change-Post: -3 %
FEV6-%Pred-Post: 106 %
FEV6-%Pred-Pre: 109 %
FEV6-Post: 3.74 L
FEV6-Pre: 3.88 L
FEV6FVC-%Change-Post: 0 %
FEV6FVC-%Pred-Post: 102 %
FEV6FVC-%Pred-Pre: 101 %
FVC-%Change-Post: -3 %
FVC-%Pred-Post: 104 %
FVC-%Pred-Pre: 108 %
FVC-Post: 3.74 L
FVC-Pre: 3.89 L
Post FEV1/FVC ratio: 86 %
Post FEV6/FVC ratio: 100 %
Pre FEV1/FVC ratio: 83 %
Pre FEV6/FVC Ratio: 100 %
RV % pred: 127 %
RV: 1.92 L
TLC % pred: 114 %
TLC: 5.63 L

## 2017-06-17 NOTE — Progress Notes (Signed)
PFT done today. 

## 2017-07-03 ENCOUNTER — Ambulatory Visit: Payer: Self-pay | Admitting: *Deleted

## 2017-07-03 ENCOUNTER — Ambulatory Visit (INDEPENDENT_AMBULATORY_CARE_PROVIDER_SITE_OTHER): Payer: Medicare Other | Admitting: Family Medicine

## 2017-07-03 ENCOUNTER — Other Ambulatory Visit: Payer: Self-pay | Admitting: Family Medicine

## 2017-07-03 ENCOUNTER — Encounter: Payer: Self-pay | Admitting: Family Medicine

## 2017-07-03 VITALS — BP 102/70 | HR 104 | Temp 98.2°F | Ht 63.0 in | Wt 105.4 lb

## 2017-07-03 DIAGNOSIS — R059 Cough, unspecified: Secondary | ICD-10-CM

## 2017-07-03 DIAGNOSIS — R509 Fever, unspecified: Secondary | ICD-10-CM

## 2017-07-03 DIAGNOSIS — R05 Cough: Secondary | ICD-10-CM | POA: Diagnosis not present

## 2017-07-03 LAB — POC INFLUENZA A&B (BINAX/QUICKVUE)
Influenza A, POC: NEGATIVE
Influenza B, POC: NEGATIVE

## 2017-07-03 MED ORDER — BENZONATATE 200 MG PO CAPS: 200.0000 mg | ORAL_CAPSULE | Freq: Two times a day (BID) | ORAL | 0 refills | Status: DC | PRN

## 2017-07-03 MED ORDER — AZITHROMYCIN 250 MG PO TABS: ORAL_TABLET | ORAL | 0 refills | Status: DC

## 2017-07-03 MED ORDER — IPRATROPIUM BROMIDE 0.06 % NA SOLN: 2.0000 | Freq: Four times a day (QID) | NASAL | 0 refills | Status: DC

## 2017-07-03 NOTE — Progress Notes (Signed)
    Subjective:  Karen Hahn is a 41 y.o. female who presents today with a chief complaint of cough.   HPI:  Cough, acute issue Symptoms started about a week ago.  Initially started having a scratchy throat that progressed to having fever for the last couple of days.  She has been taking her temperature at home and it has been as high as 101.8 F.  Also is having some chills and myalgias.  Cough is causing her to have some upper abdominal pain.  Associated symptoms also include rhinorrhea and postnasal drip.  She had one sick contact about a week ago with similar symptoms.  No treatments tried.  No other obvious alleviating or aggravating factors.  ROS: Per HPI  PMH: Smoking history reviewed.  Former smoker.  Objective:  Physical Exam: BP 102/70 (BP Location: Left Arm, Patient Position: Sitting, Cuff Size: Normal)   Pulse (!) 104   Temp 98.2 F (36.8 C) (Oral)   Ht 5\' 3"  (1.6 m)   Wt 105 lb 6.1 oz (47.8 kg)   LMP 06/23/2017   SpO2 98%   BMI 18.67 kg/m   Gen: NAD, resting comfortably HEENT: TMs clear bilaterally.  Oropharynx erythematous without exudate.  Dry appearing mucous membranes.  Maxillary sinuses are clear transillumination bilaterally.  No lymphadenopathy. CV: RRR with no murmurs appreciated Pulm: NWOB, CTAB with no crackles, wheezes, or rhonchi  Results for orders placed or performed in visit on 07/03/17 (from the past 72 hour(s))  POC Influenza A&B(BINAX/QUICKVUE)     Status: None   Collection Time: 07/03/17 12:15 PM  Result Value Ref Range   Influenza A, POC Negative Negative   Influenza B, POC Negative Negative    Assessment/Plan:  Cough No signs or symptoms of bacterial infection at this point.  Rapid flu negative.  Start Atrovent nasal spray.  Also start Tessalon for cough.  Provide patient with a "pocket prescription" for azithromycin with strict instruction to not start for another 3-4 days if symptoms not improving or any worsening.  Encouraged Tylenol  and/or ibuprofen as needed for low-grade fever and pain.  Encouraged good oral hydration.  Return precautions reviewed.  Follow-up as needed.  07/05/17. Katina Degree, MD 07/03/2017 12:18 PM

## 2017-07-03 NOTE — Patient Instructions (Signed)
Start the atrovent and cough medication.  Start the zpack if not improving in a few days.  Take care,  Dr Jimmey Ralph

## 2017-07-03 NOTE — Telephone Encounter (Signed)
started with stratchy throat on 06/30/17, then fever and congestion; pt states that her back hurts from coughing; she has been using old prescriptions of inhaler and flonase but no resolution of symptoms Reason for Disposition . [1] Fever returns after gone for over 24 hours AND [2] symptoms worse or not improved  Answer Assessment - Initial Assessment Questions 1. ONSET: "When did the cough begin?"       2. SEVERITY: "How bad is the cough today?"      severe 3. RESPIRATORY DISTRESS: "Describe your breathing."      Breathing ok; using inhaler because cough does take breath away 4. FEVER: "Do you have a fever?" If so, ask: "What is your temperature, how was it measured, and when did it start?"    previously 101.8 to 101.5;  last taken this am @ 0400 99.5 5. SPUTUM: "Describe the color of your sputum" (clear, white, yellow, green)     yellow 6. HEMOPTYSIS: "Are you coughing up any blood?" If so ask: "How much?" (flecks, streaks, tablespoons, etc.)     no 7. CARDIAC HISTORY: "Do you have any history of heart disease?" (e.g., heart attack, congestive heart failure)      no 8. LUNG HISTORY: "Do you have any history of lung disease?"  (e.g., pulmonary embolus, asthma, emphysema)     Uses inhaler that was previously prescribed due to cough 9. PE RISK FACTORS: "Do you have a history of blood clots?" (or: recent major surgery, recent prolonged travel, bedridden )     no 10. OTHER SYMPTOMS: "Do you have any other symptoms?" (e.g., runny nose, wheezing, chest pain)       Runny nose, post nasal drip, congestion, "feels like a head cold with a very bad cough" 11. PREGNANCY: "Is there any chance you are pregnant?" "When was your last menstrual period?"       No LMP 06/21/17 12. TRAVEL: "Have you traveled out of the country in the last month?" (e.g., travel history, exposures)       no  Protocols used: COUGH - ACUTE PRODUCTIVE-A-AH

## 2017-07-14 ENCOUNTER — Other Ambulatory Visit: Payer: Self-pay

## 2017-07-16 ENCOUNTER — Ambulatory Visit: Payer: Self-pay | Admitting: Gynecology

## 2017-07-21 ENCOUNTER — Other Ambulatory Visit: Payer: Self-pay

## 2017-07-28 ENCOUNTER — Ambulatory Visit (INDEPENDENT_AMBULATORY_CARE_PROVIDER_SITE_OTHER): Payer: Medicare Other | Admitting: Family

## 2017-07-28 ENCOUNTER — Encounter: Payer: Self-pay | Admitting: Family

## 2017-07-28 VITALS — BP 110/64 | HR 101 | Ht 63.0 in | Wt 107.0 lb

## 2017-07-28 DIAGNOSIS — R300 Dysuria: Secondary | ICD-10-CM | POA: Diagnosis not present

## 2017-07-28 LAB — POCT URINALYSIS DIPSTICK
Bilirubin, UA: NEGATIVE
Blood, UA: NEGATIVE
Glucose, UA: NEGATIVE
Ketones, UA: NEGATIVE
Leukocytes, UA: NEGATIVE
Nitrite, UA: NEGATIVE
Protein, UA: NEGATIVE
Spec Grav, UA: >=1.03 — AB (ref 1.010–1.025)
Urobilinogen, UA: 0.2 E.U./dL
pH, UA: 6 (ref 5.0–8.0)

## 2017-07-28 MED ORDER — CEPHALEXIN 500 MG PO CAPS: 500.0000 mg | ORAL_CAPSULE | Freq: Two times a day (BID) | ORAL | 0 refills | Status: DC

## 2017-07-28 NOTE — Patient Instructions (Signed)
We will be in touch after urine culture results are obtained; increase water intake;

## 2017-07-28 NOTE — Progress Notes (Signed)
Karen Hahn is a 41 y.o. female with the following history as recorded in EpicCare:  Patient Active Problem List   Diagnosis Date Noted  . Encounter for well adult exam with abnormal findings 05/21/2017  . UTI (urinary tract infection) 05/21/2017  . Thrush 05/21/2017  . Vitamin D deficiency 05/21/2017  . Osteopenia 05/21/2017  . Dyspnea 05/21/2017  . Hematuria 01/01/2017  . Lump in female breast 01/01/2017  . Fatigue 01/01/2017  . Candida vaginitis 01/01/2017  . Dysuria 12/19/2016  . Gastroesophageal reflux disease with esophagitis 11/14/2016  . Irritable bowel syndrome with constipation 11/14/2016  . Severe episode of recurrent major depressive disorder, without psychotic features (HCC) 08/24/2015  . Cough 11/15/2014  . Major depressive disorder, recurrent episode, moderate (HCC) 07/14/2014  . ADD (attention deficit hyperactivity disorder, inattentive type) 07/14/2014  . Impulse control disorder 07/14/2014  . Depressive disorder, not elsewhere classified 03/08/2014  . GAD (generalized anxiety disorder) 03/08/2014  . Anorexia nervosa 11/17/2013  . Impulse control disorder, unspecified 11/17/2013  . Cannabis abuse 11/17/2013  . Insomnia 11/17/2013    Current Outpatient Medications  Medication Sig Dispense Refill  . albuterol (PROAIR HFA) 108 (90 Base) MCG/ACT inhaler Inhale 2 puffs into the lungs every 6 (six) hours as needed for wheezing or shortness of breath. 1 Inhaler 3  . benzonatate (TESSALON) 200 MG capsule Take 1 capsule (200 mg total) by mouth 2 (two) times daily as needed for cough. 20 capsule 0  . Calcium-Iron-Vit D-Vit K (CALCIUM SOFT CHEWS) 500-08-998-40 MG-UNT-MCG CHEW Chew by mouth daily.    . Cholecalciferol (VITAMIN D3) 2000 units TABS Take 1 tablet by mouth daily. 30 tablet 5  . dicyclomine (BENTYL) 10 MG capsule TAKE 1 CAPSULE BY MOUTH 4 TIMES A DAY BEFORE MEALS AND AT BEDTIME 120 capsule 0  . lamoTRIgine (LAMICTAL) 150 MG tablet Take 2 tablets (300 mg total)  daily by mouth. 60 tablet 3  . Multiple Vitamins-Minerals (MULTIVITAMIN GUMMIES ADULT) CHEW Chew by mouth.    . omega-3 fish oil (MAXEPA) 1000 MG CAPS capsule Take 1 capsule by mouth daily.    . pantoprazole (PROTONIX) 40 MG tablet Take 1 tablet (40 mg total) by mouth every morning. 90 tablet 3  . promethazine (PHENERGAN) 25 MG tablet Take 1 tablet (25 mg total) by mouth every 6 (six) hours as needed for nausea or vomiting. 30 tablet 5  . ranitidine (ZANTAC) 150 MG tablet TAKE 1 TABLET BY MOUTH EVERY MORNING 90 tablet 0  . rosuvastatin (CRESTOR) 10 MG tablet Take 1 tablet (10 mg total) by mouth daily. 90 tablet 3  . cephALEXin (KEFLEX) 500 MG capsule Take 1 capsule (500 mg total) by mouth 2 (two) times daily. 14 capsule 0   No current facility-administered medications for this visit.     Allergies: Bactrim [sulfamethoxazole-trimethoprim]; Penicillins; Ciprofloxacin; Morphine and related; Reglan [metoclopramide]; Benadryl [diphenhydramine hcl]; and Diflucan [fluconazole]  Past Medical History:  Diagnosis Date  . ADD (attention deficit disorder)   . Anorexia nervosa without bulimia   . Chronic constipation   . COPD (chronic obstructive pulmonary disease) (HCC)   . Drug abuse (HCC)   . GAD (generalized anxiety disorder)   . GERD (gastroesophageal reflux disease)   . History of gastric restrictive surgery   . History of gastric ulcer   . History of Helicobacter pylori infection    after 2003  . Impulse control disorder   . Moderate major depression (HCC)   . Osteopenia   . Post concussion syndrome  fell 10/20/2015  . SMAS (superior mesenteric artery syndrome) (HCC)   . Urge urinary incontinence     Past Surgical History:  Procedure Laterality Date  . DILATION AND CURETTAGE OF UTERUS  2005  . INTERSTIM IMPLANT PLACEMENT N/A 10/18/2015   Procedure: [name] IMPLANT FIRST STAGE;  Surgeon: Jerilee Field, MD;  Location[facility];  Service: Urology;  Laterality: N/A;   . INTERSTIM IMPLANT PLACEMENT N/A 10/18/2015   Procedure: [name] IMPLANT SECOND STAGE;  Surgeon: Jerilee Field, MD;  Location[facility];  Service: Urology;  Laterality: N/A;  . LAPAROSCOPIC GASTRIC RESTRICTIVE DUODENAL PROCEDURE (DUODENAL SWITCH)  12/ 2003  . ORIF RIGHT WRIST FX  2013   REMOVAL HARDWARE X2 in  2013--  No retained hardware    Family History  Problem Relation Age of Onset  . Alcohol abuse Mother   . Anxiety disorder Mother   . Depression Mother        severe with hx of suicide attempt. treated with ECT  . COPD Mother   . Anxiety disorder Father   . Drug abuse Father   . COPD Father   . Alcohol abuse Maternal Uncle   . Anxiety disorder Maternal Grandmother   . Depression Maternal Grandmother   . ADD / ADHD Brother   . Breast cancer Paternal Grandmother     Social History   Tobacco Use  . Smoking status: Former Smoker    Packs/day: 0.50    Years: 1.00    Pack years: 0.50    Types: Cigarettes    Last attempt to quit: 12/03/2016    Years since quitting: 0.6  . Smokeless tobacco: Never Used  Substance Use Topics  . Alcohol use: No    Alcohol/week: 0.0 oz    Subjective:  Presents with concerns for UTI; notes she is prone to recurrent UTIs; + urgency, frequency, burning; denies any blood in her urine; LMP was last week; denies concerns for change in vaginal discharge; requesting Rx with Keflex as she has tolerated this successfully in the past.   Objective:  Vitals:   07/28/17 0824  BP: 110/64  Pulse: (!) 101  SpO2: 99%  Weight: 107 lb (48.5 kg)  Height: 5\' 3"  (1.6 m)    General: Well developed, well nourished, in no acute distress  Skin : Warm and dry.  Head: Normocephalic and atraumatic  Lungs: Respirations unlabored; clear to auscultation bilaterally without wheeze, rales, rhonchi  CVS exam: normal rate and regular rhythm.  Neurologic: Alert and oriented; speech intact; face symmetrical; moves all extremities well; CNII-XII  intact without focal deficit  Assessment:  1. Dysuria     Plan:  Patient is prone to recurrent UTIs; check U/A and urine culture; even though U/A is unremarkable, symptoms are concerning; Rx for Keflex 500 mg bid x 7 days- patient has tolerated in the past with no difficulty; if culture is normal, to consider treatment for BV; follow-up to be determined.  No Follow-up on file.  Orders Placed This Encounter  Procedures  . Urine Culture    Standing Status:   Future    Standing Expiration Date:   07/28/2018  . POCT Urinalysis Dipstick    Requested Prescriptions   Signed Prescriptions Disp Refills  . cephALEXin (KEFLEX) 500 MG capsule 14 capsule 0    Sig: Take 1 capsule (500 mg total) by mouth 2 (two) times daily.

## 2017-07-30 ENCOUNTER — Ambulatory Visit
Admission: RE | Admit: 2017-07-30 | Discharge: 2017-07-30 | Disposition: A | Discharge status: Home / Self Care | Payer: Medicare Other | Source: Ambulatory Visit | Attending: Family | Admitting: Family

## 2017-07-30 ENCOUNTER — Other Ambulatory Visit: Payer: Self-pay | Admitting: Family

## 2017-07-30 DIAGNOSIS — N63 Unspecified lump in unspecified breast: Secondary | ICD-10-CM

## 2017-08-12 ENCOUNTER — Ambulatory Visit: Payer: Self-pay | Admitting: Gynecology

## 2017-08-14 ENCOUNTER — Encounter (HOSPITAL_COMMUNITY): Payer: Self-pay | Admitting: Psychiatry

## 2017-08-14 ENCOUNTER — Ambulatory Visit (INDEPENDENT_AMBULATORY_CARE_PROVIDER_SITE_OTHER): Payer: Medicare Other | Admitting: Psychiatry

## 2017-08-14 VITALS — BP 106/60 | HR 94 | Ht 63.0 in | Wt 107.0 lb

## 2017-08-14 DIAGNOSIS — F411 Generalized anxiety disorder: Secondary | ICD-10-CM

## 2017-08-14 DIAGNOSIS — Z87891 Personal history of nicotine dependence: Secondary | ICD-10-CM

## 2017-08-14 DIAGNOSIS — F639 Impulse disorder, unspecified: Secondary | ICD-10-CM | POA: Diagnosis not present

## 2017-08-14 DIAGNOSIS — Z811 Family history of alcohol abuse and dependence: Secondary | ICD-10-CM | POA: Diagnosis not present

## 2017-08-14 DIAGNOSIS — F5000 Anorexia nervosa, unspecified: Secondary | ICD-10-CM

## 2017-08-14 DIAGNOSIS — F1211 Cannabis abuse, in remission: Secondary | ICD-10-CM

## 2017-08-14 DIAGNOSIS — F330 Major depressive disorder, recurrent, mild: Secondary | ICD-10-CM | POA: Diagnosis not present

## 2017-08-14 DIAGNOSIS — Z818 Family history of other mental and behavioral disorders: Secondary | ICD-10-CM

## 2017-08-14 DIAGNOSIS — F5105 Insomnia due to other mental disorder: Secondary | ICD-10-CM

## 2017-08-14 DIAGNOSIS — Z813 Family history of other psychoactive substance abuse and dependence: Secondary | ICD-10-CM

## 2017-08-14 DIAGNOSIS — F99 Mental disorder, not otherwise specified: Secondary | ICD-10-CM

## 2017-08-14 MED ORDER — LAMOTRIGINE 150 MG PO TABS: 300.0000 mg | ORAL_TABLET | Freq: Every day | ORAL | 3 refills | Status: DC

## 2017-08-14 NOTE — Progress Notes (Signed)
BH MD/PA/NP OP Progress Note  08/14/2017 1:17 PM Karen Hahn  MRN:  [id number]  Chief Complaint:  Chief Complaint    Depression; Follow-up     HPI: Pt is having car trouble and is nervous driving it.   She is getting along with her roommate- Karen Hahn. They have not had any confrontations. Karen Hahn got her own car.   Pt has cut off contact with all contact with her mom. It helps with dealing with her anorexia. Pt is eating 3 meals a day and not focusing on her weight. It has been good for a while which feels odd for her.  Pt denies any impulsive behaviors but she continues to pick at her skin on her face.   Pt has anxiety related to her mother's health and behavior and her car. It comes and goes.   She is denying all illicit substances and alcohol use.   Sleep is good.   Depression is "not that bad". She notice more around her period for about one week. She has a lot of irritability 2 weeks before her period. Pt is doing anger management and has taken 3 sessions so far. Pt will isolate and pick at her skin more. Having a dog helps a lot.  Pt denies SI/HI/AVH.  Pt denies manic and hypomanic symptoms including periods of decreased need for sleep, increased energy, mood lability, impulsivity, FOI, and excessive spending.  Pt denies manic and hypomanic symptoms including periods of decreased need for sleep, increased energy, mood lability, impulsivity, FOI, and excessive spending.  Pt states-taking meds as prescribed and denies SE.    Visit Diagnosis:    ICD-10-CM   1. MDD (major depressive disorder), recurrent episode, mild (HCC) F33.0   2. Impulse control disorder F63.9 lamoTRIgine (LAMICTAL) 150 MG tablet  3. GAD (generalized anxiety disorder) F41.1   4. Insomnia due to other mental disorder F51.05    F99   5. Anorexia nervosa F50.00        Past Psychiatric History:  Anxiety: Yes Bipolar Disorder: No Depression: Yes Mania: No Psychosis: No Schizophrenia:  No Personality Disorder: No Hospitalization for psychiatric illness: Yes numerous- 2 intensive eating program treatments in 2013 and 2014. BH eating d/o with Dr. Orland Dec Nov 2011. Multiple inpt hospitalizations at [facility] (2011, 2011, 2005), [place] (3x), [facility], [place], [place] (06/2013).  History of Electroconvulsive Shock Therapy: No Prior Suicide Attempts: No  Previous meds: Risperdal, Zyprexa, Seroquel, Paxil, Prozac, Remeron, Cymbalta   Past Medical History:  Past Medical History:  Diagnosis Date  . ADD (attention deficit disorder)   . Anorexia nervosa without bulimia   . Chronic constipation   . COPD (chronic obstructive pulmonary disease) (HCC)   . Drug abuse (HCC)   . GAD (generalized anxiety disorder)   . GERD (gastroesophageal reflux disease)   . History of gastric restrictive surgery   . History of gastric ulcer   . History of Helicobacter pylori infection    after 2003  . Impulse control disorder   . Moderate major depression (HCC)   . Osteopenia   . Post concussion syndrome    fell 10/20/2015  . SMAS (superior mesenteric artery syndrome) (HCC)   . Urge urinary incontinence     Past Surgical History:  Procedure Laterality Date  . DILATION AND CURETTAGE OF UTERUS  2005  . INTERSTIM IMPLANT PLACEMENT N/A 10/18/2015   Procedure: Karen Hahn IMPLANT FIRST STAGE;  Surgeon: Jerilee Field, MD;  Location[facility];  Service: Urology;  Laterality:  N/A;  Karen Hahn IMPLANT PLACEMENT N/A 10/18/2015   Procedure: Karen Hahn IMPLANT SECOND STAGE;  Surgeon: Jerilee Field, MD;  Location: [facility];  Service: Urology;  Laterality: N/A;  . LAPAROSCOPIC GASTRIC RESTRICTIVE DUODENAL PROCEDURE (DUODENAL SWITCH)  12/ 2003  . ORIF RIGHT WRIST FX  2013   REMOVAL HARDWARE X2 in  2013--  No retained hardware    Family Psychiatric History:  Family History  Problem Relation Age of Onset  . Alcohol abuse Mother   . Anxiety disorder Mother   .  Depression Mother        severe with hx of suicide attempt. treated with ECT  . COPD Mother   . Anxiety disorder Father   . Drug abuse Father   . COPD Father   . Alcohol abuse Maternal Uncle   . Anxiety disorder Maternal Grandmother   . Depression Maternal Grandmother   . ADD / ADHD Brother   . Breast cancer Paternal Grandmother     Social History:  Social History   Socioeconomic History  . Marital status: Divorced    Spouse name: None  . Number of children: 0  . Years of education: 30  . Highest education level: None  Social Needs  . Financial resource strain: None  . Food insecurity - worry: None  . Food insecurity - inability: None  . Transportation needs - medical: Yes  . Transportation needs - non-medical: Yes  Occupational History  . Occupation: Disability  Tobacco Use  . Smoking status: Former Smoker    Packs/day: 0.50    Years: 1.00    Pack years: 0.50    Types: Cigarettes    Last attempt to quit: 12/03/2016    Years since quitting: 0.6  . Smokeless tobacco: Never Used  Substance and Sexual Activity  . Alcohol use: No    Alcohol/week: 0.0 oz  . Drug use: No    Comment: hx of and last used cocaine 2012. she was smoking marjiuana 7g/week. smoking thru out the day but quit 4 months ago  . Sexual activity: Not Currently  Other Topics Concern  . None  Social History Narrative   Fun: Play piano; Build Lego   Denies abuse and feels safe at home. Living with friend in [place].     Allergies:  Allergies  Allergen Reactions  . Bactrim [Sulfamethoxazole-Trimethoprim] Other (See Comments)    Yeast infection  . Penicillins Other (See Comments)    Has patient had a PCN reaction causing immediate rash, facial/tongue/throat swelling, SOB or lightheadedness with hypotension: NO (UTI, YEAST INFECTION) Has patient had a PCN reaction causing severe rash involving mucus membranes or skin necrosis: NO Has patient had a PCN reaction that required hospitalization  NO Has patient had a PCN reaction occurring within the last 10 years: NO If all of the above answers are "NO", then may proceed with Cephalosporin use.   . Ciprofloxacin Itching  . Morphine And Related Itching and Other (See Comments)    "whole body feels tight feeling"  . Reglan [Metoclopramide]   . Benadryl [Diphenhydramine Hcl] Palpitations  . Diflucan [Fluconazole] Rash    Metabolic Disorder Labs: No results found for: HGBA1C, MPG No results found for: PROLACTIN Lab Results  Component Value Date   CHOL 216 (H) 05/21/2017   TRIG 87.0 05/21/2017   HDL 59.80 05/21/2017   CHOLHDL 4 05/21/2017   VLDL 17.4 05/21/2017   LDLCALC 139 (H) 05/21/2017   Lab Results  Component Value Date   TSH  3.05 05/21/2017   TSH 1.59 01/01/2017    Therapeutic Level Labs: No results found for: LITHIUM No results found for: VALPROATE No components found for:  CBMZ  Current Medications: Current Outpatient Medications  Medication Sig Dispense Refill  . albuterol (PROAIR HFA) 108 (90 Base) MCG/ACT inhaler Inhale 2 puffs into the lungs every 6 (six) hours as needed for wheezing or shortness of breath. 1 Inhaler 3  . Calcium-Iron-Vit D-Vit K (CALCIUM SOFT CHEWS) 500-08-998-40 MG-UNT-MCG CHEW Chew by mouth daily.    . Cholecalciferol (VITAMIN D3) 2000 units TABS Take 1 tablet by mouth daily. 30 tablet 5  . dicyclomine (BENTYL) 10 MG capsule TAKE 1 CAPSULE BY MOUTH 4 TIMES A DAY BEFORE MEALS AND AT BEDTIME 120 capsule 0  . lamoTRIgine (LAMICTAL) 150 MG tablet Take 2 tablets (300 mg total) daily by mouth. 60 tablet 3  . Multiple Vitamins-Minerals (MULTIVITAMIN GUMMIES ADULT) CHEW Chew by mouth.    . omega-3 fish oil (MAXEPA) 1000 MG CAPS capsule Take 1 capsule by mouth daily.    . pantoprazole (PROTONIX) 40 MG tablet Take 1 tablet (40 mg total) by mouth every morning. 90 tablet 3  . promethazine (PHENERGAN) 25 MG tablet Take 1 tablet (25 mg total) by mouth every 6 (six) hours as needed for nausea or  vomiting. 30 tablet 5  . ranitidine (ZANTAC) 150 MG tablet TAKE 1 TABLET BY MOUTH EVERY MORNING 90 tablet 0  . rosuvastatin (CRESTOR) 10 MG tablet Take 1 tablet (10 mg total) by mouth daily. 90 tablet 3  . benzonatate (TESSALON) 200 MG capsule Take 1 capsule (200 mg total) by mouth 2 (two) times daily as needed for cough. (Patient not taking: Reported on 08/14/2017) 20 capsule 0  . cephALEXin (KEFLEX) 500 MG capsule Take 1 capsule (500 mg total) by mouth 2 (two) times daily. (Patient not taking: Reported on 08/14/2017) 14 capsule 0   No current facility-administered medications for this visit.      Musculoskeletal: Strength & Muscle Tone: within normal limits Gait & Station: normal Patient leans: N/A  Psychiatric Specialty Exam: Review of Systems  Musculoskeletal: Negative for back pain, joint pain, myalgias and neck pain.  Neurological: Negative for dizziness, tingling, tremors, sensory change and headaches.    Blood pressure 106/60, pulse 94, height 5\' 3"  (1.6 m), weight 107 lb (48.5 kg), last menstrual period 07/25/2017, SpO2 99 %.Body mass index is 18.95 kg/m.  General Appearance: Casual  Eye Contact:  Good  Speech:  Clear and Coherent and Normal Rate  Volume:  Normal  Mood:  Euthymic  Affect:  Full Range  Thought Process:  Goal Directed and Descriptions of Associations: Intact  Orientation:  Full (Time, Place, and Person)  Thought Content: Logical   Suicidal Thoughts:  No  Homicidal Thoughts:  No  Memory:  Immediate;   Good Recent;   Good Remote;   Good  Judgement:  Fair  Insight:  Fair  Psychomotor Activity:  Normal  Concentration:  Concentration: Fair and Attention Span: Fair  Recall:  07/27/2017 of Knowledge: Fair  Language: Fair  Akathisia:  No  Handed:  Right  AIMS (if indicated): not done  Assets:  Communication Skills Desire for Improvement Housing Social Support Transportation  ADL's:  Intact  Cognition: WNL  Sleep:  Fair   Screenings: PHQ2-9      Office Visit from 11/14/2016 in [facility]  PHQ-2 Total Score  0       Assessment and Plan: MDD-recurrent, severe  without psychotic features vs Bipolar II disorder- stable Anorexia Nervosa- stable Impulse control disorder- ongoing symptoms GAD- ongoing symptoms ADHD-inattentive type- ongoing symptoms Insomnia- improved Cannabis use disorder- in remission Reviewed by MD on 08/14/2017- no change in Assessment dx    Medication management with supportive therapy. Risks/benefits and SE of the medication discussed. Pt verbalized understanding and verbal consent obtained for treatment.  Affirm with the patient that the medications are taken as ordered. Patient expressed understanding of how their medications were to be used.    Meds: Lamical 300mg  po qD for depression vs Bipolar disorder, impulse control disorder Pt is managing symptoms of Anorexia, GAD, ADHD and insomnia with her therapist and own her own   Labs: none  Therapy: brief supportive therapy provided. Discussed psychosocial stressors in detail.   Encouraged pt to develop daily routine and work on daily goal setting as a way to improve mood symptoms.    Consultations:Encouraged to follow up with therapist Encouraged to follow up with PCP as needed  Pt denies SI and is at an acute low risk for suicide. Patient told to call clinic if any problems occur. Patient advised to go to ER if they should develop SI/HI, side effects, or if symptoms worsen. Has crisis numbers to call if needed. Pt verbalized understanding.  F/up in 3 months or sooner if needed    Oletta Darter, MD 08/14/2017, 1:17 PM

## 2017-09-19 ENCOUNTER — Encounter: Payer: Self-pay | Admitting: Gynecology

## 2017-09-19 ENCOUNTER — Ambulatory Visit (INDEPENDENT_AMBULATORY_CARE_PROVIDER_SITE_OTHER): Payer: Medicare Other | Admitting: Gynecology

## 2017-09-19 VITALS — BP 110/64 | Ht 63.0 in | Wt 104.0 lb

## 2017-09-19 DIAGNOSIS — Z124 Encounter for screening for malignant neoplasm of cervix: Secondary | ICD-10-CM

## 2017-09-19 DIAGNOSIS — Z01419 Encounter for gynecological examination (general) (routine) without abnormal findings: Secondary | ICD-10-CM | POA: Diagnosis not present

## 2017-09-19 DIAGNOSIS — Z1151 Encounter for screening for human papillomavirus (HPV): Secondary | ICD-10-CM | POA: Diagnosis not present

## 2017-09-19 NOTE — Addendum Note (Signed)
Addended by: Dayna Barker on: 09/19/2017 04:21 PM   Modules accepted: Orders

## 2017-09-19 NOTE — Patient Instructions (Signed)
Follow-up in 1 year, sooner as needed. 

## 2017-09-19 NOTE — Progress Notes (Signed)
    Karen Hahn [birth year] [id number]        42 y.o.  G2P0020 new patient for annual gynecologic exam.  Without GYN complaints.  Having regular monthly menses.  Not sexually active nor plans to be.  History of HPV in the past reported.  Small probable fibroadenoma right breast as described at her mammography 07/2017 with recommended 75-month follow-up.  Past medical history,surgical history, problem list, medications, allergies, family history and social history were all reviewed and documented as reviewed in the EPIC chart.  ROS:  Performed with pertinent positives and negatives included in the history, assessment and plan.   Additional significant findings : None   Exam: Kennon Portela assistant Vitals:   09/19/17 1514  BP: 110/64  Weight: 104 lb (47.2 kg)  Height: 5\' 3"  (1.6 m)   Body mass index is 18.42 kg/m.  General appearance:  Normal affect, orientation and appearance. Skin: Grossly normal HEENT: Without gross lesions.  No cervical or supraclavicular adenopathy. Thyroid normal.  Lungs:  Clear without wheezing, rales or rhonchi Cardiac: RR, without RMG Abdominal:  Soft, nontender, without masses, guarding, rebound, organomegaly or hernia Breasts:  Examined lying and sitting without masses, retractions, discharge or axillary adenopathy. Pelvic:  Ext, BUS, Vagina: Normal  Cervix: Normal.  Pap smear/HPV  Uterus: Anteverted, normal size, shape and contour, midline and mobile nontender   Adnexa: Without masses or tenderness    Anus and perineum: Normal   Rectovaginal: Normal sphincter tone without palpated masses or tenderness.    Assessment/Plan:  42 y.o. G62P0020 female for annual gynecologic exam with regular menses, not sexually active.   1. Small probable fibroadenoma right breast at 6 mm.  A 4 cm from 10 o'clock position of the nipple.  Exam is normal with no palpable abnormalities.  Patient will follow-up for her recommended 75-month mammography.  Will follow  radiologist recommendations. 2. Pap smear 2015.  Pap smear/HPV today.  History of HPV reported years ago. 3. Health maintenance.  No routine lab work done as patient reports this done elsewhere.  Follow-up in 1 year, sooner as needed.   8-month MD, 3:46 PM 09/19/2017

## 2017-09-24 LAB — PAP IG AND HPV HIGH-RISK: HPV DNA High Risk: NOT DETECTED

## 2017-10-14 ENCOUNTER — Ambulatory Visit (INDEPENDENT_AMBULATORY_CARE_PROVIDER_SITE_OTHER): Payer: Medicare Other | Admitting: Family Medicine

## 2017-10-14 DIAGNOSIS — J069 Acute upper respiratory infection, unspecified: Secondary | ICD-10-CM | POA: Diagnosis not present

## 2017-10-14 LAB — POCT RAPID STREP A (OFFICE): Rapid Strep A Screen: NEGATIVE

## 2017-10-14 NOTE — Assessment & Plan Note (Signed)
Symptoms likely viral in nature.  - counseled on supportive care  - given indications to follow up.  

## 2017-10-14 NOTE — Progress Notes (Signed)
Karen Hahn - 42 y.o. female MRN [id number]  Date of birth: 06-Nov-1975  SUBJECTIVE:  Including CC & ROS.  Chief Complaint  Patient presents with  . Cough    Cough and congestion; Sore throat; Nausea, Loss of appetite, body aches (started about 4 days ago)    Karen Hahn is a 42 y.o. female that is presenting withcough and congestion.her symptoms have been present for a couple of days.She has been around people with similar symptoms. She denies any fevers.   Review of Systems  Constitutional: Positive for activity change. Negative for fever.  HENT: Positive for congestion.   Respiratory: Positive for cough.   Cardiovascular: Negative for chest pain.  Gastrointestinal: Negative for abdominal pain.    HISTORY: Past Medical, Surgical, Social, and Family History Reviewed & Updated per EMR.   Pertinent Historical Findings include:  Past Medical History:  Diagnosis Date  . ADD (attention deficit disorder)   . Anorexia nervosa without bulimia   . Chronic constipation   . Drug abuse (HCC)   . GAD (generalized anxiety disorder)   . GERD (gastroesophageal reflux disease)   . History of gastric restrictive surgery   . History of gastric ulcer   . History of Helicobacter pylori infection    after 2003  . Impulse control disorder   . Moderate major depression (HCC)   . Osteopenia   . Post concussion syndrome    fell 10/20/2015  . SMAS (superior mesenteric artery syndrome) (HCC)   . Urge urinary incontinence     Past Surgical History:  Procedure Laterality Date  . DILATION AND CURETTAGE OF UTERUS  2005  . INTERSTIM IMPLANT PLACEMENT N/A 10/18/2015   Procedure: [name] IMPLANT FIRST STAGE;  Surgeon: Jerilee Field, MD;  Location[facility];  Service: Urology;  Laterality: N/A;  . INTERSTIM IMPLANT PLACEMENT N/A 10/18/2015   Procedure: [name] IMPLANT SECOND STAGE;  Surgeon: Jerilee Field, MD;  Location: [facility];  Service: Urology;   Laterality: N/A;  . LAPAROSCOPIC GASTRIC RESTRICTIVE DUODENAL PROCEDURE (DUODENAL SWITCH)  12/ 2003  . ORIF RIGHT WRIST FX  2013   REMOVAL HARDWARE X2 in  2013--  No retained hardware    Allergies  Allergen Reactions  . Bactrim [Sulfamethoxazole-Trimethoprim] Other (See Comments)    Yeast infection  . Penicillins Other (See Comments)    Has patient had a PCN reaction causing immediate rash, facial/tongue/throat swelling, SOB or lightheadedness with hypotension: NO (UTI, YEAST INFECTION) Has patient had a PCN reaction causing severe rash involving mucus membranes or skin necrosis: NO Has patient had a PCN reaction that required hospitalization NO Has patient had a PCN reaction occurring within the last 10 years: NO If all of the above answers are "NO", then may proceed with Cephalosporin use.   . Ciprofloxacin Itching  . Morphine And Related Itching and Other (See Comments)    "whole body feels tight feeling"  . Reglan [Metoclopramide]   . Benadryl [Diphenhydramine Hcl] Palpitations  . Diflucan [Fluconazole] Rash    Family History  Problem Relation Age of Onset  . Alcohol abuse Mother   . Anxiety disorder Mother   . Depression Mother        severe with hx of suicide attempt. treated with ECT  . COPD Mother   . Cancer Mother        Cervical  . Anxiety disorder Father   . Drug abuse Father   . COPD Father   . Alcohol abuse Maternal Uncle   .  Anxiety disorder Maternal Grandmother   . Depression Maternal Grandmother   . ADD / ADHD Brother   . Breast cancer Paternal Grandmother 75     Social History   Socioeconomic History  . Marital status: Divorced    Spouse name: Not on file  . Number of children: 0  . Years of education: 62  . Highest education level: Not on file  Social Needs  . Financial resource strain: Not on file  . Food insecurity - worry: Not on file  . Food insecurity - inability: Not on file  . Transportation needs - medical: Yes  . Transportation  needs - non-medical: Yes  Occupational History  . Occupation: Disability  Tobacco Use  . Smoking status: Former Smoker    Packs/day: 0.50    Years: 1.00    Pack years: 0.50    Types: Cigarettes    Last attempt to quit: 12/03/2016    Years since quitting: 0.8  . Smokeless tobacco: Never Used  Substance and Sexual Activity  . Alcohol use: No    Alcohol/week: 0.0 oz  . Drug use: No    Comment: hx of and last used cocaine 2012. she was smoking marjiuana 7g/week. smoking thru out the day but quit 4 months ago  . Sexual activity: Not Currently    Comment: 1st intercourse 42 yo-Fewer than 5 partners  Other Topics Concern  . Not on file  Social History Narrative   Fun: Play piano; Build Lego   Denies abuse and feels safe at home. Living with friend in [place].      PHYSICAL EXAM:  VS: BP 112/78 (BP Location: Left Arm, Patient Position: Sitting, Cuff Size: Normal)   Pulse (!) 107   Temp 98.7 F (37.1 C) (Oral)   Ht 5\' 3"  (1.6 m)   Wt 103 lb 1.3 oz (46.8 kg)   SpO2 95%   BMI 18.26 kg/m  Physical Exam Gen: NAD, alert, cooperative with exam,  ENT: normal lips, normal nasal mucosa, tympanic membranes clear and intact bilaterally, normal oropharynx, no cervical lymphadenopathy Eye: normal EOM, normal conjunctiva and lids CV:  no edema, +2 pedal pulses, regular rhythm, S1-S2   Resp: no accessory muscle use, non-labored, clear to auscultation bilaterally, no crackles or wheezes Skin: no rashes, no areas of induration  Neuro: normal tone, normal sensation to touch Psych:  normal insight, alert and oriented MSK: Normal gait, normal strength       ASSESSMENT & PLAN:   URI (upper respiratory infection) Symptoms likely viral in nature.  - counseled on supportive care  - given indications to follow up.

## 2017-10-14 NOTE — Patient Instructions (Signed)
Please try things such as zyrtec-D or allegra-D which is an antihistamine and decongestant.   Please try afrin which will help with nasal congestion but use for only three days.   Please also try using a netti pot on a regular occasion.  Honey can help with a sore throat.   Vick's and Delsym can help with a cough.  

## 2017-10-14 NOTE — Addendum Note (Signed)
Addended by: Karma Ganja on: 10/14/2017 11:17 AM   Modules accepted: Orders

## 2017-10-31 ENCOUNTER — Other Ambulatory Visit: Payer: Self-pay | Admitting: Internal Medicine

## 2017-10-31 ENCOUNTER — Other Ambulatory Visit: Payer: Self-pay | Admitting: Family

## 2017-11-05 ENCOUNTER — Telehealth: Payer: Self-pay | Admitting: Internal Medicine

## 2017-11-05 MED ORDER — PROMETHAZINE HCL 25 MG PO TABS: 25.0000 mg | ORAL_TABLET | Freq: Four times a day (QID) | ORAL | 5 refills | Status: DC | PRN

## 2017-11-05 NOTE — Telephone Encounter (Signed)
Copied from CRM [id number]. Topic: Quick Communication - See Telephone Encounter >> Nov 05, 2017  1:19 PM Diana Eves B wrote: CRM for notification. See Telephone encounter for: 11/05/17. Pt wanting to know why promethazine (PHENERGAN) 25 MG tablet was denied.

## 2017-11-05 NOTE — Telephone Encounter (Signed)
Patient checking status, call back [phone]

## 2017-11-05 NOTE — Telephone Encounter (Signed)
Must have been an oversight. Med sent in.

## 2017-11-13 ENCOUNTER — Ambulatory Visit (INDEPENDENT_AMBULATORY_CARE_PROVIDER_SITE_OTHER): Payer: Medicare Other | Admitting: Psychiatry

## 2017-11-13 ENCOUNTER — Encounter (HOSPITAL_COMMUNITY): Payer: Self-pay | Admitting: Psychiatry

## 2017-11-13 VITALS — BP 101/66 | HR 95 | Wt 105.6 lb

## 2017-11-13 DIAGNOSIS — Z87891 Personal history of nicotine dependence: Secondary | ICD-10-CM | POA: Diagnosis not present

## 2017-11-13 DIAGNOSIS — F332 Major depressive disorder, recurrent severe without psychotic features: Secondary | ICD-10-CM | POA: Diagnosis not present

## 2017-11-13 DIAGNOSIS — F900 Attention-deficit hyperactivity disorder, predominantly inattentive type: Secondary | ICD-10-CM | POA: Diagnosis not present

## 2017-11-13 DIAGNOSIS — F5000 Anorexia nervosa, unspecified: Secondary | ICD-10-CM | POA: Diagnosis not present

## 2017-11-13 DIAGNOSIS — F411 Generalized anxiety disorder: Secondary | ICD-10-CM | POA: Diagnosis not present

## 2017-11-13 DIAGNOSIS — Z818 Family history of other mental and behavioral disorders: Secondary | ICD-10-CM

## 2017-11-13 DIAGNOSIS — Z811 Family history of alcohol abuse and dependence: Secondary | ICD-10-CM | POA: Diagnosis not present

## 2017-11-13 DIAGNOSIS — G4700 Insomnia, unspecified: Secondary | ICD-10-CM

## 2017-11-13 DIAGNOSIS — Z736 Limitation of activities due to disability: Secondary | ICD-10-CM

## 2017-11-13 DIAGNOSIS — Z813 Family history of other psychoactive substance abuse and dependence: Secondary | ICD-10-CM | POA: Diagnosis not present

## 2017-11-13 DIAGNOSIS — F639 Impulse disorder, unspecified: Secondary | ICD-10-CM | POA: Diagnosis not present

## 2017-11-13 DIAGNOSIS — F1290 Cannabis use, unspecified, uncomplicated: Secondary | ICD-10-CM

## 2017-11-13 MED ORDER — LAMOTRIGINE 150 MG PO TABS: 300.0000 mg | ORAL_TABLET | Freq: Every day | ORAL | 3 refills | Status: DC

## 2017-11-13 NOTE — Progress Notes (Signed)
BH MD/PA/NP OP Progress Note  11/13/2017 2:34 PM Karen Hahn  MRN:  [id number]  Chief Complaint:  Chief Complaint    Depression; Follow-up     HPI: "Doing pretty good". She got her car fixed mostly. Things have improved between her and Wynona Canes. Pt states her anxiety is ongoing and unchanged. Pt has some visible skin lesions from skin picking on her face.  Pt states she is more active and feels less depressed in the spring. She is much more irritable with her period. Pt is eating 3 meals a day and denies restricting or purging. Pt avoids talking with her mom as possible. Sleep is "pretty good". Pt denies SI/HI. Pt states-taking meds as prescribed and denies SE. 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  Visit Diagnosis:    ICD-10-CM   1. Severe episode of recurrent major depressive disorder, without psychotic features (HCC) F33.2   2. GAD (generalized anxiety disorder) F41.1   3. Impulse control disorder F63.9 lamoTRIgine (LAMICTAL) 150 MG tablet  4. Anorexia nervosa F50.00   5. Attention deficit hyperactivity disorder (ADHD), predominantly inattentive type F90.0      Past Psychiatric History:  Anxiety:Yes Bipolar Disorder:No Depression:Yes Mania:No Psychosis:No Schizophrenia:No Personality Disorder:No Hospitalization for psychiatric illness:Yesnumerous- 2 intensive eating program treatments in 2013 and 2014. BH eating d/o with Dr. Orland Dec Nov 2011. Multiple inpt hospitalizations at [facility] (2011, 2011, 2005), [place] (3x), [facility], [place], [place] (06/2013).  History of Electroconvulsive Shock Therapy:No Prior Suicide Attempts:No Previous meds: Risperdal, Zyprexa, Seroquel, Paxil, Prozac, Remeron, Cymbalta      Past Medical History:  Past Medical History:  Diagnosis Date  . ADD (attention deficit disorder)   . Anorexia nervosa without bulimia   . Chronic constipation   . Drug abuse (HCC)   . GAD (generalized anxiety  disorder)   . GERD (gastroesophageal reflux disease)   . History of gastric restrictive surgery   . History of gastric ulcer   . History of Helicobacter pylori infection    after 2003  . Impulse control disorder   . Moderate major depression (HCC)   . Osteopenia   . Post concussion syndrome    fell 10/20/2015  . SMAS (superior mesenteric artery syndrome) (HCC)   . Urge urinary incontinence     Past Surgical History:  Procedure Laterality Date  . DILATION AND CURETTAGE OF UTERUS  2005  . INTERSTIM IMPLANT PLACEMENT N/A 10/18/2015   Procedure: [name] IMPLANT FIRST STAGE;  Surgeon: Jerilee Field, MD;  Location: [facility];  Service: Urology;  Laterality: N/A;  . INTERSTIM IMPLANT PLACEMENT N/A 10/18/2015   Procedure: [name] IMPLANT SECOND STAGE;  Surgeon: Jerilee Field, MD;  Location: [facility];  Service: Urology;  Laterality: N/A;  . LAPAROSCOPIC GASTRIC RESTRICTIVE DUODENAL PROCEDURE (DUODENAL SWITCH)  12/ 2003  . ORIF RIGHT WRIST FX  2013   REMOVAL HARDWARE X2 in  2013--  No retained hardware    Family Psychiatric History:  Family History  Problem Relation Age of Onset  . Alcohol abuse Mother   . Anxiety disorder Mother   . Depression Mother        severe with hx of suicide attempt. treated with ECT  . COPD Mother   . Cancer Mother        Cervical  . Anxiety disorder Father   . Drug abuse Father   . COPD Father   . Alcohol abuse Maternal Uncle   . Anxiety disorder Maternal  Grandmother   . Depression Maternal Grandmother   . ADD / ADHD Brother   . Breast cancer Paternal Grandmother 33    Social History:  Social History   Socioeconomic History  . Marital status: Divorced    Spouse name: Not on file  . Number of children: 0  . Years of education: 20  . Highest education level: Not on file  Occupational History  . Occupation: Disability  Social Needs  . Financial resource strain: Not on file  . Food insecurity:     Worry: Not on file    Inability: Not on file  . Transportation needs:    Medical: Yes    Non-medical: Yes  Tobacco Use  . Smoking status: Former Smoker    Packs/day: 0.50    Years: 1.00    Pack years: 0.50    Types: Cigarettes    Last attempt to quit: 12/03/2016    Years since quitting: 0.9  . Smokeless tobacco: Never Used  Substance and Sexual Activity  . Alcohol use: No    Alcohol/week: 0.0 oz  . Drug use: No    Frequency: 7.0 times per week    Types: Marijuana, "Crack" cocaine, Cocaine    Comment: hx of and last used cocaine 2012. she was smoking marjiuana 7g/week. smoking thru out the day but quit 4 months ago  . Sexual activity: Not Currently    Comment: 1st intercourse 42 yo-Fewer than 5 partners  Lifestyle  . Physical activity:    Days per week: 0 days    Minutes per session: 0 min  . Stress: Not on file  Relationships  . Social connections:    Talks on phone: Not on file    Gets together: Not on file    Attends religious service: Not on file    Active member of club or organization: Not on file    Attends meetings of clubs or organizations: Not on file    Relationship status: Not on file  Other Topics Concern  . Not on file  Social History Narrative   Fun: Play piano; Build Lego   Denies abuse and feels safe at home. Living with friend in [place].     Allergies:  Allergies  Allergen Reactions  . Bactrim [Sulfamethoxazole-Trimethoprim] Other (See Comments)    Yeast infection  . Penicillins Other (See Comments)    Has patient had a PCN reaction causing immediate rash, facial/tongue/throat swelling, SOB or lightheadedness with hypotension: NO (UTI, YEAST INFECTION) Has patient had a PCN reaction causing severe rash involving mucus membranes or skin necrosis: NO Has patient had a PCN reaction that required hospitalization NO Has patient had a PCN reaction occurring within the last 10 years: NO If all of the above answers are "NO", then may proceed with  Cephalosporin use.   . Ciprofloxacin Itching  . Morphine And Related Itching and Other (See Comments)    "whole body feels tight feeling"  . Reglan [Metoclopramide]   . Benadryl [Diphenhydramine Hcl] Palpitations  . Diflucan [Fluconazole] Rash    Metabolic Disorder Labs: No results found for: HGBA1C, MPG No results found for: PROLACTIN Lab Results  Component Value Date   CHOL 216 (H) 05/21/2017   TRIG 87.0 05/21/2017   HDL 59.80 05/21/2017   CHOLHDL 4 05/21/2017   VLDL 17.4 05/21/2017   LDLCALC 139 (H) 05/21/2017   Lab Results  Component Value Date   TSH 3.05 05/21/2017   TSH 1.59 01/01/2017    Therapeutic Level Labs: No  results found for: LITHIUM No results found for: VALPROATE No components found for:  CBMZ  Current Medications: Current Outpatient Medications  Medication Sig Dispense Refill  . albuterol (PROAIR HFA) 108 (90 Base) MCG/ACT inhaler Inhale 2 puffs into the lungs every 6 (six) hours as needed for wheezing or shortness of breath. 1 Inhaler 3  . Calcium-Iron-Vit D-Vit K (CALCIUM SOFT CHEWS) 500-08-998-40 MG-UNT-MCG CHEW Chew by mouth daily.    . Cholecalciferol (VITAMIN D3) 2000 units TABS Take 1 tablet by mouth daily. 30 tablet 5  . dicyclomine (BENTYL) 10 MG capsule TAKE 1 CAPSULE BY MOUTH FOUR TIMES DAILY BEFORE MEALS AND AT BEDTIME 120 capsule 0  . lamoTRIgine (LAMICTAL) 150 MG tablet Take 2 tablets (300 mg total) by mouth daily. 60 tablet 3  . Multiple Vitamins-Minerals (MULTIVITAMIN GUMMIES ADULT) CHEW Chew by mouth.    . omega-3 fish oil (MAXEPA) 1000 MG CAPS capsule Take 1 capsule by mouth daily.    Marland Kitchen omeprazole (PRILOSEC) 40 MG capsule Take 40 mg by mouth.    . pantoprazole (PROTONIX) 40 MG tablet Take 1 tablet (40 mg total) by mouth every morning. 90 tablet 3  . promethazine (PHENERGAN) 25 MG tablet Take 1 tablet (25 mg total) by mouth every 6 (six) hours as needed for nausea or vomiting. 30 tablet 5  . ranitidine (ZANTAC) 150 MG tablet TAKE 1  TABLET BY MOUTH EVERY MORNING 90 tablet 0  . rosuvastatin (CRESTOR) 10 MG tablet Take 1 tablet (10 mg total) by mouth daily. 90 tablet 3   No current facility-administered medications for this visit.      Musculoskeletal: Strength & Muscle Tone: within normal limits Gait & Station: normal Patient leans: N/A  Psychiatric Specialty Exam: Review of Systems  Constitutional: Negative for chills, diaphoresis and fever.  HENT: Negative for congestion, sinus pain and sore throat.     Blood pressure 101/66, pulse 95, weight 105 lb 9.6 oz (47.9 kg).Body mass index is 18.71 kg/m.  General Appearance: Fairly Groomed  Eye Contact:  Good  Speech:  Clear and Coherent and Normal Rate  Volume:  Normal  Mood:  Anxious  Affect:  Full Range  Thought Process:  Coherent and Descriptions of Associations: Circumstantial  Orientation:  Full (Time, Place, and Person)  Thought Content: Logical   Suicidal Thoughts:  No  Homicidal Thoughts:  No  Memory:  Immediate;   Good Recent;   Good Remote;   Good  Judgement:  Fair  Insight:  Fair  Psychomotor Activity:  Normal  Concentration:  Concentration: Poor and Attention Span: Good  Recall:  Good  Fund of Knowledge: Good  Language: Good  Akathisia:  No  Handed:  Right  AIMS (if indicated): not done  Assets:  Communication Skills Desire for Improvement Housing Social Support Talents/Skills Transportation  ADL's:  Intact  Cognition: WNL  Sleep:  Good   Screenings: PHQ2-9     Office Visit from 11/14/2016 in [facility]  PHQ-2 Total Score  0       Assessment and Plan: MDD-recurrent, severe without psychotic features versus bipolar 2 disorder Anorexia nervosa Impulse control disorder GAD ADHD-inattentive type Insomnia Cannabis use disorder    Medication management with supportive therapy. Risks and benefits, side effects and alternative treatment options discussed with patient. Pt was given an opportunity to ask  questions about medication, illness, and treatment. All current psychiatric medications have been reviewed and discussed with the patient and adjusted as clinically appropriate. The patient has been provided  an accurate and updated list of the medications being now prescribed. Patient expressed understanding of how their medications were to be used.  Pt verbalized understanding and verbal consent obtained for treatment.  The risk of un-intended pregnancy is low based on the fact that pt reports she is not using any form of contraceptives. Pt is aware that these meds carry a teratogenic risk. Pt will discuss plan of action if she does or plans to become pregnant in the future.  Status of current problems: stables  Meds: Lamictal 300 mg p.o. daily for MDD versus bipolar disorder, impulse control disorder Patient is managing symptoms of anorexia, GAD, ADHD, insomnia with her therapist and on her own   Labs: none  Therapy: brief supportive therapy provided. Discussed psychosocial stressors in detail.   Encouraged pt to develop daily routine and work on daily goal setting as a way to improve mood symptoms.   Consultations: Encouraged to follow up with therapist Encouraged to follow up with PCP as needed  Pt denies SI and is at an acute low risk for suicide. Patient told to call clinic if any problems occur. Patient advised to go to ER if they should develop SI/HI, side effects, or if symptoms worsen. Has crisis numbers to call if needed. Pt verbalized understanding.  F/up in 3 months or sooner if needed    Oletta Darter, MD 11/13/2017, 2:34 PM

## 2017-12-05 ENCOUNTER — Other Ambulatory Visit: Payer: Self-pay | Admitting: Internal Medicine

## 2018-01-03 ENCOUNTER — Other Ambulatory Visit: Payer: Self-pay | Admitting: Internal Medicine

## 2018-01-05 ENCOUNTER — Other Ambulatory Visit (HOSPITAL_COMMUNITY): Payer: Self-pay | Admitting: Psychiatry

## 2018-01-05 DIAGNOSIS — F639 Impulse disorder, unspecified: Secondary | ICD-10-CM

## 2018-01-28 ENCOUNTER — Other Ambulatory Visit: Payer: Medicare Other

## 2018-01-30 ENCOUNTER — Other Ambulatory Visit: Payer: Self-pay | Admitting: Family

## 2018-01-31 ENCOUNTER — Other Ambulatory Visit: Payer: Self-pay | Admitting: Internal Medicine

## 2018-02-12 ENCOUNTER — Encounter (HOSPITAL_COMMUNITY): Payer: Self-pay | Admitting: Psychiatry

## 2018-02-12 ENCOUNTER — Ambulatory Visit (INDEPENDENT_AMBULATORY_CARE_PROVIDER_SITE_OTHER): Payer: Medicare Other | Admitting: Psychiatry

## 2018-02-12 VITALS — BP 102/74 | HR 99 | Ht 63.0 in | Wt 104.8 lb

## 2018-02-12 DIAGNOSIS — F5000 Anorexia nervosa, unspecified: Secondary | ICD-10-CM | POA: Diagnosis not present

## 2018-02-12 DIAGNOSIS — F411 Generalized anxiety disorder: Secondary | ICD-10-CM

## 2018-02-12 DIAGNOSIS — G4700 Insomnia, unspecified: Secondary | ICD-10-CM | POA: Diagnosis not present

## 2018-02-12 DIAGNOSIS — F330 Major depressive disorder, recurrent, mild: Secondary | ICD-10-CM

## 2018-02-12 DIAGNOSIS — F639 Impulse disorder, unspecified: Secondary | ICD-10-CM

## 2018-02-12 MED ORDER — LAMOTRIGINE 150 MG PO TABS: 300.0000 mg | ORAL_TABLET | Freq: Every day | ORAL | 3 refills | Status: DC

## 2018-02-12 NOTE — Progress Notes (Signed)
BH MD/PA/NP OP Progress Note  02/12/2018 1:45 PM Karen Hahn  MRN:  [id number]  Chief Complaint:  Chief Complaint    Follow-up     HPI: Pt reports she is having some conflict with her roommate's boyfriend. They usually get along well and pt is not concerned. They had an argument but pt was able to control her impulse to slam doors. Pt feels she is able to control her behavior a little more than before. Pt is mostly struggling with a lot of anxiety. Karen Hahn has a very low stress tolerance and everything makes her anxious. She has some depression but it is mostly unchanged. Her dad wants to fly the patient out to visit him out of state. He is saving up to buy her a ticket. Pt continues to do surveys online for money and states it helps. Pt is budgeting well. Sleep is good. Appetite is good and she is drinking Boost once a day along with 3 meals a day. Pt denies SI/HI.   Visit Diagnosis:    ICD-10-CM   1. GAD (generalized anxiety disorder) F41.1   2. MDD (major depressive disorder), recurrent episode, mild (HCC) F33.0   3. Impulse control disorder F63.9 lamoTRIgine (LAMICTAL) 150 MG tablet  4. Anorexia nervosa F50.00   5. Insomnia, unspecified type G47.00       Past Psychiatric History:  Anxiety:Yes Bipolar Disorder:No Depression:Yes Mania:No Psychosis:No Schizophrenia:No Personality Disorder:No Hospitalization for psychiatric illness:Yesnumerous- 2 intensive eating program treatments in 2013 and 2014. BH eating d/o with Dr. Orland Dec Nov 2011. Multiple inpt hospitalizations at [facility] (2011, 2011, 2005), [place] (3x), [facility], [place], [place] (06/2013).  History of Electroconvulsive Shock Therapy:No Prior Suicide Attempts:No Previous meds: Risperdal, Zyprexa, Seroquel, Paxil, Prozac, Remeron, Cymbalta    Past Medical History:  Past Medical History:  Diagnosis Date  . ADD (attention deficit disorder)   . Anorexia nervosa without bulimia   . Chronic  constipation   . Drug abuse (HCC)   . GAD (generalized anxiety disorder)   . GERD (gastroesophageal reflux disease)   . History of gastric restrictive surgery   . History of gastric ulcer   . History of Helicobacter pylori infection    after 2003  . Impulse control disorder   . Moderate major depression (HCC)   . Osteopenia   . Post concussion syndrome    fell 10/20/2015  . SMAS (superior mesenteric artery syndrome) (HCC)   . Urge urinary incontinence     Past Surgical History:  Procedure Laterality Date  . DILATION AND CURETTAGE OF UTERUS  2005  . INTERSTIM IMPLANT PLACEMENT N/A 10/18/2015   Procedure: [name] IMPLANT FIRST STAGE;  Surgeon: Jerilee Field, MD;  Location: [facility];  Service: Urology;  Laterality: N/A;  . INTERSTIM IMPLANT PLACEMENT N/A 10/18/2015   Procedure: [name] IMPLANT SECOND STAGE;  Surgeon: Jerilee Field, MD;  Location: [facility];  Service: Urology;  Laterality: N/A;  . LAPAROSCOPIC GASTRIC RESTRICTIVE DUODENAL PROCEDURE (DUODENAL SWITCH)  12/ 2003  . ORIF RIGHT WRIST FX  2013   REMOVAL HARDWARE X2 in  2013--  No retained hardware    Family Psychiatric History:  Family History  Problem Relation Age of Onset  . Alcohol abuse Mother   . Anxiety disorder Mother   . Depression Mother        severe with hx of suicide attempt. treated with ECT  . COPD Mother   . Cancer Mother        Cervical  .  Anxiety disorder Father   . Drug abuse Father   . COPD Father   . Alcohol abuse Maternal Uncle   . Anxiety disorder Maternal Grandmother   . Depression Maternal Grandmother   . ADD / ADHD Brother   . Breast cancer Paternal Grandmother 53    Social History:  Social History   Socioeconomic History  . Marital status: Divorced    Spouse name: Not on file  . Number of children: 0  . Years of education: 90  . Highest education level: Not on file  Occupational History  . Occupation: Disability  Social Needs  .  Financial resource strain: Not on file  . Food insecurity:    Worry: Not on file    Inability: Not on file  . Transportation needs:    Medical: Yes    Non-medical: Yes  Tobacco Use  . Smoking status: Former Smoker    Packs/day: 0.50    Years: 1.00    Pack years: 0.50    Types: Cigarettes    Last attempt to quit: 12/03/2016    Years since quitting: 1.1  . Smokeless tobacco: Never Used  Substance and Sexual Activity  . Alcohol use: No    Alcohol/week: 0.0 oz  . Drug use: No    Frequency: 7.0 times per week    Types: Marijuana, "Crack" cocaine, Cocaine    Comment: hx of and last used cocaine 2012. she was smoking marjiuana 7g/week. smoking thru out the day but quit 4 months ago  . Sexual activity: Not Currently    Comment: 1st intercourse 42 yo-Fewer than 5 partners  Lifestyle  . Physical activity:    Days per week: 0 days    Minutes per session: 0 min  . Stress: Not on file  Relationships  . Social connections:    Talks on phone: Not on file    Gets together: Not on file    Attends religious service: Not on file    Active member of club or organization: Not on file    Attends meetings of clubs or organizations: Not on file    Relationship status: Not on file  Other Topics Concern  . Not on file  Social History Narrative   Fun: Play piano; Build Lego   Denies abuse and feels safe at home. Living with friend in [place].     Allergies:  Allergies  Allergen Reactions  . Bactrim [Sulfamethoxazole-Trimethoprim] Other (See Comments)    Yeast infection  . Penicillins Other (See Comments)    Has patient had a PCN reaction causing immediate rash, facial/tongue/throat swelling, SOB or lightheadedness with hypotension: NO (UTI, YEAST INFECTION) Has patient had a PCN reaction causing severe rash involving mucus membranes or skin necrosis: NO Has patient had a PCN reaction that required hospitalization NO Has patient had a PCN reaction occurring within the last 10 years:  NO If all of the above answers are "NO", then may proceed with Cephalosporin use.   . Ciprofloxacin Itching  . Morphine And Related Itching and Other (See Comments)    "whole body feels tight feeling"  . Reglan [Metoclopramide]   . Benadryl [Diphenhydramine Hcl] Palpitations  . Diflucan [Fluconazole] Rash    Metabolic Disorder Labs: No results found for: HGBA1C, MPG No results found for: PROLACTIN Lab Results  Component Value Date   CHOL 216 (H) 05/21/2017   TRIG 87.0 05/21/2017   HDL 59.80 05/21/2017   CHOLHDL 4 05/21/2017   VLDL 17.4 05/21/2017   LDLCALC 139 (  H) 05/21/2017   Lab Results  Component Value Date   TSH 3.05 05/21/2017   TSH 1.59 01/01/2017    Therapeutic Level Labs: No results found for: LITHIUM No results found for: VALPROATE No components found for:  CBMZ  Current Medications: Current Outpatient Medications  Medication Sig Dispense Refill  . albuterol (PROAIR HFA) 108 (90 Base) MCG/ACT inhaler Inhale 2 puffs into the lungs every 6 (six) hours as needed for wheezing or shortness of breath. 1 Inhaler 3  . Calcium-Iron-Vit D-Vit K (CALCIUM SOFT CHEWS) [id number] MG-UNT-MCG CHEW Chew by mouth daily.    . Cholecalciferol (VITAMIN D3) 2000 units TABS Take 1 tablet by mouth daily. 30 tablet 5  . dicyclomine (BENTYL) 10 MG capsule TAKE 1 CAPSULE BY MOUTH FOUR TIMES DAILY BEFORE MEALS AND AT BEDTIME 120 capsule 0  . lamoTRIgine (LAMICTAL) 150 MG tablet Take 2 tablets (300 mg total) by mouth daily. 60 tablet 3  . Multiple Vitamins-Minerals (MULTIVITAMIN GUMMIES ADULT) CHEW Chew by mouth.    . omega-3 fish oil (MAXEPA) 1000 MG CAPS capsule Take 1 capsule by mouth daily.    Marland Kitchen omeprazole (PRILOSEC) 40 MG capsule Take 40 mg by mouth.    . pantoprazole (PROTONIX) 40 MG tablet Take 1 tablet (40 mg total) by mouth every morning. 90 tablet 3  . ranitidine (ZANTAC) 150 MG tablet TAKE 1 TABLET BY MOUTH EVERY MORNING 90 tablet 0  . rosuvastatin (CRESTOR) 10 MG tablet Take  1 tablet (10 mg total) by mouth daily. 90 tablet 3  . promethazine (PHENERGAN) 25 MG tablet Take 1 tablet (25 mg total) by mouth every 6 (six) hours as needed for nausea or vomiting. 30 tablet 5   No current facility-administered medications for this visit.      Musculoskeletal: Strength & Muscle Tone: within normal limits Gait & Station: normal Patient leans: N/A  Psychiatric Specialty Exam: Review of Systems  Constitutional: Negative for chills, diaphoresis and fever.  Psychiatric/Behavioral: Negative for depression, hallucinations and suicidal ideas. The patient is nervous/anxious. The patient does not have insomnia.     Blood pressure 102/74, pulse 99, height 5\' 3"  (1.6 m), weight 104 lb 12.8 oz (47.5 kg).Body mass index is 18.56 kg/m.  General Appearance: Casual  Eye Contact:  Good  Speech:  Clear and Coherent and Normal Rate  Volume:  Normal  Mood:  Anxious  Affect:  Congruent  Thought Process:  Coherent and Descriptions of Associations: Circumstantial  Orientation:  Full (Time, Place, and Person)  Thought Content: Logical   Suicidal Thoughts:  No  Homicidal Thoughts:  No  Memory:  Immediate;   Good Recent;   Good Remote;   Good  Judgement:  Fair  Insight:  Present  Psychomotor Activity:  Normal  Concentration:  Concentration: Good and Attention Span: Good  Recall:  Good  Fund of Knowledge: Good  Language: Good  Akathisia:  No  Handed:  Right  AIMS (if indicated): not done  Assets:  Communication Skills Desire for Improvement Housing Social Support Transportation  ADL's:  Intact  Cognition: WNL  Sleep:  Good   Screenings: PHQ2-9     Office Visit from 11/14/2016 in [facility]  PHQ-2 Total Score  0      I reviewed the information below on 02/12/2018 and agree except where noted/changed Assessment and Plan: MDD-recurrent, severe without psychotic features versus bipolar 2 disorder Anorexia nervosa Impulse control  disorder GAD ADHD-inattentive type Insomnia Cannabis use disorder    Medication management with  supportive therapy. Risks and benefits, side effects and alternative treatment options discussed with patient. Pt was given an opportunity to ask questions about medication, illness, and treatment. All current psychiatric medications have been reviewed and discussed with the patient and adjusted as clinically appropriate. The patient has been provided an accurate and updated list of the medications being now prescribed. Patient expressed understanding of how their medications were to be used.  Pt verbalized understanding and verbal consent obtained for treatment.  The risk of un-intended pregnancy is low based on the fact that pt reports she is not using any form of contraceptives but is not sexually active at this time. Pt is aware that these meds carry a teratogenic risk. Pt will discuss plan of action if she does or plans to become pregnant in the future.  Status of current problems: stable  Meds: Lamictal 300 mg p.o. daily for MDD versus bipolar disorder, impulse control disorder Patient is managing symptoms of anorexia, GAD, ADHD, insomnia on her own   Labs: none  Therapy: brief supportive therapy provided. Discussed psychosocial stressors in detail.   Encouraged pt to develop daily routine and work on daily goal setting as a way to improve mood symptoms.  I declined pt's request to fill out paperwork for her cat to travel by plane as an emotional support animal due to lack of animal training  Consultations: Encouraged to follow up with PCP as needed  Pt denies SI and is at an acute low risk for suicide. Patient told to call clinic if any problems occur. Patient advised to go to ER if they should develop SI/HI, side effects, or if symptoms worsen. Has crisis numbers to call if needed. Pt verbalized understanding.  F/up in 3 months or sooner if needed    Oletta Darter,  MD 02/12/2018, 1:45 PM

## 2018-02-16 ENCOUNTER — Other Ambulatory Visit (INDEPENDENT_AMBULATORY_CARE_PROVIDER_SITE_OTHER): Payer: Medicare Other

## 2018-02-16 ENCOUNTER — Encounter: Payer: Self-pay | Admitting: Internal Medicine

## 2018-02-16 ENCOUNTER — Ambulatory Visit (INDEPENDENT_AMBULATORY_CARE_PROVIDER_SITE_OTHER): Payer: Medicare Other | Admitting: Internal Medicine

## 2018-02-16 VITALS — BP 122/74 | HR 100 | Temp 98.3°F | Ht 63.0 in | Wt 104.0 lb

## 2018-02-16 DIAGNOSIS — G43909 Migraine, unspecified, not intractable, without status migrainosus: Secondary | ICD-10-CM | POA: Insufficient documentation

## 2018-02-16 DIAGNOSIS — F5000 Anorexia nervosa, unspecified: Secondary | ICD-10-CM | POA: Diagnosis not present

## 2018-02-16 DIAGNOSIS — Z0000 Encounter for general adult medical examination without abnormal findings: Secondary | ICD-10-CM

## 2018-02-16 DIAGNOSIS — M8580 Other specified disorders of bone density and structure, unspecified site: Secondary | ICD-10-CM

## 2018-02-16 DIAGNOSIS — E785 Hyperlipidemia, unspecified: Secondary | ICD-10-CM | POA: Diagnosis not present

## 2018-02-16 HISTORY — DX: Hyperlipidemia, unspecified: E78.5

## 2018-02-16 LAB — URINALYSIS, ROUTINE W REFLEX MICROSCOPIC
Bilirubin Urine: NEGATIVE
Hgb urine dipstick: NEGATIVE
Ketones, ur: NEGATIVE
Leukocytes, UA: NEGATIVE
Nitrite: NEGATIVE
RBC / HPF: NONE SEEN (ref 0–?)
Specific Gravity, Urine: <=1.005 — AB (ref 1.000–1.030)
Total Protein, Urine: NEGATIVE
Urine Glucose: NEGATIVE
Urobilinogen, UA: 0.2 (ref 0.0–1.0)
pH: 6.5 (ref 5.0–8.0)

## 2018-02-16 LAB — LIPID PANEL
Cholesterol: 197 mg/dL (ref 0–200)
HDL: 58.9 mg/dL (ref >39.00)
LDL Cholesterol: 125 mg/dL — ABNORMAL HIGH (ref 0–99)
NonHDL: 138.19
Total CHOL/HDL Ratio: 3
Triglycerides: 67 mg/dL (ref 0.0–149.0)
VLDL: 13.4 mg/dL (ref 0.0–40.0)

## 2018-02-16 LAB — HEPATIC FUNCTION PANEL
ALT: 15 U/L (ref 0–35)
AST: 17 U/L (ref 0–37)
Albumin: 4.6 g/dL (ref 3.5–5.2)
Alkaline Phosphatase: 63 U/L (ref 39–117)
Bilirubin, Direct: 0.1 mg/dL (ref 0.0–0.3)
Total Bilirubin: 0.5 mg/dL (ref 0.2–1.2)
Total Protein: 7.5 g/dL (ref 6.0–8.3)

## 2018-02-16 LAB — CBC WITH DIFFERENTIAL/PLATELET
Basophils Absolute: 0.1 10*3/uL (ref 0.0–0.1)
Basophils Relative: 0.8 % (ref 0.0–3.0)
Eosinophils Absolute: 0.2 10*3/uL (ref 0.0–0.7)
Eosinophils Relative: 2.2 % (ref 0.0–5.0)
HCT: 37.2 % (ref 36.0–46.0)
Hemoglobin: 12.6 g/dL (ref 12.0–15.0)
Lymphocytes Relative: 30.2 % (ref 12.0–46.0)
Lymphs Abs: 2.4 10*3/uL (ref 0.7–4.0)
MCHC: 34 g/dL (ref 30.0–36.0)
MCV: 86.6 fl (ref 78.0–100.0)
Monocytes Absolute: 0.4 10*3/uL (ref 0.1–1.0)
Monocytes Relative: 5.7 % (ref 3.0–12.0)
Neutro Abs: 4.8 10*3/uL (ref 1.4–7.7)
Neutrophils Relative %: 61.1 % (ref 43.0–77.0)
Platelets: 270 10*3/uL (ref 150.0–400.0)
RBC: 4.29 Mil/uL (ref 3.87–5.11)
RDW: 12.3 % (ref 11.5–15.5)
WBC: 7.9 10*3/uL (ref 4.0–10.5)

## 2018-02-16 LAB — BASIC METABOLIC PANEL
BUN: 12 mg/dL (ref 6–23)
CO2: 28 mEq/L (ref 19–32)
Calcium: 9.8 mg/dL (ref 8.4–10.5)
Chloride: 102 mEq/L (ref 96–112)
Creatinine, Ser: 0.82 mg/dL (ref 0.40–1.20)
GFR: 82.25 mL/min (ref >60.00)
Glucose, Bld: 98 mg/dL (ref 70–99)
Potassium: 4.8 mEq/L (ref 3.5–5.1)
Sodium: 137 mEq/L (ref 135–145)

## 2018-02-16 LAB — TSH: TSH: 3.11 u[IU]/mL (ref 0.35–4.50)

## 2018-02-16 MED ORDER — BOOST PO LIQD: 237.0000 mL | Freq: Three times a day (TID) | ORAL | 11 refills | Status: DC

## 2018-02-16 MED ORDER — IBUPROFEN 600 MG PO TABS: 600.0000 mg | ORAL_TABLET | Freq: Three times a day (TID) | ORAL | 2 refills | Status: DC | PRN

## 2018-02-16 NOTE — Assessment & Plan Note (Signed)
For f/u dxa 

## 2018-02-16 NOTE — Assessment & Plan Note (Signed)
Cont psychiatric followup

## 2018-02-16 NOTE — Assessment & Plan Note (Signed)

## 2018-02-16 NOTE — Progress Notes (Signed)
Subjective:    Patient ID: Karen Hahn, female    DOB: Aug 29, 1975, 42 y.o.   MRN: [id number]  HPI  Here for wellness and f/u;  Overall doing ok;  Pt denies Chest pain, worsening SOB, DOE, wheezing, orthopnea, PND, worsening LE edema, palpitations, dizziness or syncope.  Pt denies neurological change such as new headache, facial or extremity weakness.  Pt denies polydipsia, polyuria, or low sugar symptoms. Pt states overall good compliance with treatment and medications, good tolerability, and has been trying to follow appropriate diet.  Pt denies worsening depressive symptoms, suicidal ideation or panic. No fever, night sweats, wt loss, loss of appetite, or other constitutional symptoms.  Pt states good ability with ADL's, has low fall risk, home safety reviewed and adequate, no other significant changes in hearing or vision, and only occasionally active with exercise. Trying to drink boost bc cant gain wtvNeeds comfort animal form; saw psychiatry yesterday doing ok.  Needs DXA scan re done.  Also c/o upper abd pain and back pain "locked up" feeling with muscle spasm and ibuprofen helped Past Medical History:  Diagnosis Date  . ADD (attention deficit disorder)   . Anorexia nervosa without bulimia   . Chronic constipation   . Drug abuse (HCC)   . GAD (generalized anxiety disorder)   . GERD (gastroesophageal reflux disease)   . History of gastric restrictive surgery   . History of gastric ulcer   . History of Helicobacter pylori infection    after 2003  . HLD (hyperlipidemia) 02/16/2018  . Impulse control disorder   . Moderate major depression (HCC)   . Osteopenia   . Post concussion syndrome    fell 10/20/2015  . SMAS (superior mesenteric artery syndrome) (HCC)   . Urge urinary incontinence    Past Surgical History:  Procedure Laterality Date  . DILATION AND CURETTAGE OF UTERUS  2005  . INTERSTIM IMPLANT PLACEMENT N/A 10/18/2015   Procedure: [name] IMPLANT FIRST STAGE;  Surgeon:  Jerilee Field, MD;  Location: [facility];  Service: Urology;  Laterality: N/A;  . INTERSTIM IMPLANT PLACEMENT N/A 10/18/2015   Procedure: [name] IMPLANT SECOND STAGE;  Surgeon: Jerilee Field, MD;  Location: [facility];  Service: Urology;  Laterality: N/A;  . LAPAROSCOPIC GASTRIC RESTRICTIVE DUODENAL PROCEDURE (DUODENAL SWITCH)  12/ 2003  . ORIF RIGHT WRIST FX  2013   REMOVAL HARDWARE X2 in  2013--  No retained hardware    reports that she quit smoking about 14 months ago. Her smoking use included cigarettes. She has a 0.50 pack-year smoking history. She has never used smokeless tobacco. She reports that she does not drink alcohol or use drugs. family history includes ADD / ADHD in her brother; Alcohol abuse in her maternal uncle and mother; Anxiety disorder in her father, maternal grandmother, and mother; Breast cancer (age of onset: 23) in her paternal grandmother; COPD in her father and mother; Cancer in her mother; Depression in her maternal grandmother and mother; Drug abuse in her father. Allergies  Allergen Reactions  . Bactrim [Sulfamethoxazole-Trimethoprim] Other (See Comments)    Yeast infection  . Penicillins Other (See Comments)    Has patient had a PCN reaction causing immediate rash, facial/tongue/throat swelling, SOB or lightheadedness with hypotension: NO (UTI, YEAST INFECTION) Has patient had a PCN reaction causing severe rash involving mucus membranes or skin necrosis: NO Has patient had a PCN reaction that required hospitalization NO Has patient had a PCN reaction occurring within the last 10 years:  NO If all of the above answers are "NO", then may proceed with Cephalosporin use.   . Ciprofloxacin Itching  . Morphine And Related Itching and Other (See Comments)    "whole body feels tight feeling"  . Reglan [Metoclopramide]   . Benadryl [Diphenhydramine Hcl] Palpitations  . Diflucan [Fluconazole] Rash   Current Outpatient Medications  on File Prior to Visit  Medication Sig Dispense Refill  . albuterol (PROAIR HFA) 108 (90 Base) MCG/ACT inhaler Inhale 2 puffs into the lungs every 6 (six) hours as needed for wheezing or shortness of breath. 1 Inhaler 3  . Calcium-Iron-Vit D-Vit K (CALCIUM SOFT CHEWS) 500-08-998-40 MG-UNT-MCG CHEW Chew by mouth daily.    . Cholecalciferol (VITAMIN D3) 2000 units TABS Take 1 tablet by mouth daily. 30 tablet 5  . dicyclomine (BENTYL) 10 MG capsule TAKE 1 CAPSULE BY MOUTH FOUR TIMES DAILY BEFORE MEALS AND AT BEDTIME 120 capsule 0  . lamoTRIgine (LAMICTAL) 150 MG tablet Take 2 tablets (300 mg total) by mouth daily. 60 tablet 3  . Multiple Vitamins-Minerals (MULTIVITAMIN GUMMIES ADULT) CHEW Chew by mouth.    . omega-3 fish oil (MAXEPA) 1000 MG CAPS capsule Take 1 capsule by mouth daily.    Marland Kitchen omeprazole (PRILOSEC) 40 MG capsule Take 40 mg by mouth.    . pantoprazole (PROTONIX) 40 MG tablet Take 1 tablet (40 mg total) by mouth every morning. 90 tablet 3  . promethazine (PHENERGAN) 25 MG tablet Take 1 tablet (25 mg total) by mouth every 6 (six) hours as needed for nausea or vomiting. 30 tablet 5  . ranitidine (ZANTAC) 150 MG tablet TAKE 1 TABLET BY MOUTH EVERY MORNING 90 tablet 0  . rosuvastatin (CRESTOR) 10 MG tablet Take 1 tablet (10 mg total) by mouth daily. 90 tablet 3   No current facility-administered medications on file prior to visit.    Review of Systems Constitutional: Negative for other unusual diaphoresis, sweats, appetite or weight changes HENT: Negative for other worsening hearing loss, ear pain, facial swelling, mouth sores or neck stiffness.   Eyes: Negative for other worsening pain, redness or other visual disturbance.  Respiratory: Negative for other stridor or swelling Cardiovascular: Negative for other palpitations or other chest pain  Gastrointestinal: Negative for worsening diarrhea or loose stools, blood in stool, distention or other pain Genitourinary: Negative for hematuria,  flank pain or other change in urine volume.  Musculoskeletal: Negative for myalgias or other joint swelling.  Skin: Negative for other color change, or other wound or worsening drainage.  Neurological: Negative for other syncope or numbness. Hematological: Negative for other adenopathy or swelling Psychiatric/Behavioral: Negative for hallucinations, other worsening agitation, SI, self-injury, or new decreased concentration \\All  other system neg per pt    Objective:   Physical Exam BP 122/74   Pulse 100   Temp 98.3 F (36.8 C) (Oral)   Ht 5\' 3"  (1.6 m)   Wt 104 lb (47.2 kg)   SpO2 99%   BMI 18.42 kg/m  VS noted, thinConstitutional: Pt is oriented to person, place, and time. Appears well-developed and well-nourished, in no significant distress and comfortable Head: Normocephalic and atraumatic  Eyes: Conjunctivae and EOM are normal. Pupils are equal, round, and reactive to light Right Ear: External ear normal without discharge Left Ear: External ear normal without discharge Nose: Nose without discharge or deformity Mouth/Throat: Oropharynx is without other ulcerations and moist  Neck: Normal range of motion. Neck supple. No JVD present. No tracheal deviation present or significant neck LA or mass  Cardiovascular: Normal rate, regular rhythm, normal heart sounds and intact distal pulses.   Pulmonary/Chest: WOB normal and breath sounds without rales or wheezing  Abdominal: Soft. Bowel sounds are normal. NT. No HSM  Musculoskeletal: Normal range of motion. Exhibits no edema Lymphadenopathy: Has no other cervical adenopathy.  Neurological: Pt is alert and oriented to person, place, and time. Pt has normal reflexes. No cranial nerve deficit. Motor grossly intact, Gait intact Skin: Skin is warm and dry. No rash noted or new ulcerations Psychiatric:  Has normal mood and affect. Behavior is normal without agitation No other exam findings    Assessment & Plan:

## 2018-02-16 NOTE — Assessment & Plan Note (Signed)
Consider statin.  ?

## 2018-02-16 NOTE — Patient Instructions (Addendum)
Ok for Boost three times per day  Please schedule the bone density test before leaving today at the scheduling desk (where you check out)  Please continue all other medications as before, and refills have been done if requested.  Please have the pharmacy call with any other refills you may need.  Please continue your efforts at being more active, low cholesterol diet, and weight control.  You are otherwise up to date with prevention measures today.  Please keep your appointments with your specialists as you may have planned  Please go to the LAB in the Basement (turn left off the elevator) for the tests to be done today  You will be contacted by phone if any changes need to be made immediately.  Otherwise, you will receive a letter about your results with an explanation, but please check with MyChart first.  Please remember to sign up for MyChart if you have not done so, as this will be important to you in the future with finding out test results, communicating by private email, and scheduling acute appointments online when needed.  Please return in 1 year for your yearly visit, or sooner if needed, with Lab testing done 3-5 days before

## 2018-02-23 ENCOUNTER — Ambulatory Visit
Admission: RE | Admit: 2018-02-23 | Discharge: 2018-02-23 | Disposition: A | Discharge status: Home / Self Care | Payer: Medicare Other | Source: Ambulatory Visit | Attending: Family | Admitting: Family

## 2018-02-23 DIAGNOSIS — N63 Unspecified lump in unspecified breast: Secondary | ICD-10-CM

## 2018-02-26 ENCOUNTER — Ambulatory Visit (INDEPENDENT_AMBULATORY_CARE_PROVIDER_SITE_OTHER)
Admission: RE | Admit: 2018-02-26 | Discharge: 2018-02-26 | Disposition: A | Discharge status: Home / Self Care | Payer: Medicare Other | Source: Ambulatory Visit

## 2018-02-26 DIAGNOSIS — M8580 Other specified disorders of bone density and structure, unspecified site: Secondary | ICD-10-CM | POA: Diagnosis not present

## 2018-02-26 DIAGNOSIS — F5000 Anorexia nervosa, unspecified: Secondary | ICD-10-CM

## 2018-04-29 ENCOUNTER — Other Ambulatory Visit (HOSPITAL_COMMUNITY): Payer: Self-pay | Admitting: Psychiatry

## 2018-04-29 DIAGNOSIS — F639 Impulse disorder, unspecified: Secondary | ICD-10-CM

## 2018-05-02 ENCOUNTER — Other Ambulatory Visit: Payer: Self-pay | Admitting: Internal Medicine

## 2018-05-07 ENCOUNTER — Other Ambulatory Visit: Payer: Self-pay

## 2018-05-07 ENCOUNTER — Ambulatory Visit (INDEPENDENT_AMBULATORY_CARE_PROVIDER_SITE_OTHER): Payer: Medicare Other | Admitting: Psychiatry

## 2018-05-07 ENCOUNTER — Encounter (HOSPITAL_COMMUNITY): Payer: Self-pay | Admitting: Psychiatry

## 2018-05-07 VITALS — BP 119/72 | HR 112 | Resp 18 | Wt 100.2 lb

## 2018-05-07 DIAGNOSIS — F639 Impulse disorder, unspecified: Secondary | ICD-10-CM | POA: Diagnosis not present

## 2018-05-07 DIAGNOSIS — F411 Generalized anxiety disorder: Secondary | ICD-10-CM | POA: Diagnosis not present

## 2018-05-07 DIAGNOSIS — F330 Major depressive disorder, recurrent, mild: Secondary | ICD-10-CM

## 2018-05-07 DIAGNOSIS — F5000 Anorexia nervosa, unspecified: Secondary | ICD-10-CM | POA: Diagnosis not present

## 2018-05-07 NOTE — Progress Notes (Signed)
BH MD/PA/NP OP Progress Note  05/07/2018 4:09 PM Karen Hahn  MRN:  [id number]  Chief Complaint:  Chief Complaint    Follow-up; Medication Refill     HPI: Pt states she is doing well.Her depression and anxiety are stable. Sleep is good. She is eating 3 meals/day and is drinking one muscle gainer a day.  Despite this patient has lost 4 pounds since her last visit.  She states that she is not doing anything new-exercising, restricting, healthy eating, new meds.  I advised her to make an appointment with her PCP for a workup.  Pt denies SI/HI.  Her mother left a voice message for Karen Hahn letting her know that she is not inpatient psychiatric unit in [place].  Patient states she is waiting for a phone call from the psychiatric unit.  She is getting along well with her roommates.  Patient continues to do online surveys to obtain a little bit of cash and give cards to pay for gas and food.  Overall she feels that she is doing well at this time and has no new concerns.  Visit Diagnosis:    ICD-10-CM   1. GAD (generalized anxiety disorder) F41.1   2. Impulse control disorder F63.9   3. Anorexia nervosa F50.00   4. MDD (major depressive disorder), recurrent episode, mild (HCC) F33.0       Past Psychiatric History:  Anxiety:Yes Bipolar Disorder:No Depression:Yes Mania:No Psychosis:No Schizophrenia:No Personality Disorder:No Hospitalization for psychiatric illness:Yesnumerous- 2 intensive eating program treatments in 2013 and 2014. BH eating d/o with Dr. Orland Dec Nov 2011. Multiple inpt hospitalizations at [facility] (2011, 2011, 2005), [place] (3x), [facility], [place], [place] (06/2013).  History of Electroconvulsive Shock Therapy:No Prior Suicide Attempts:No Previous meds: Risperdal, Zyprexa, Seroquel, Paxil, Prozac, Remeron, Cymbalta    Past Medical History:  Past Medical History:  Diagnosis Date  . ADD (attention deficit disorder)   . Anorexia nervosa without  bulimia   . Chronic constipation   . Drug abuse (HCC)   . GAD (generalized anxiety disorder)   . GERD (gastroesophageal reflux disease)   . History of gastric restrictive surgery   . History of gastric ulcer   . History of Helicobacter pylori infection    after 2003  . HLD (hyperlipidemia) 02/16/2018  . Impulse control disorder   . Moderate major depression (HCC)   . Osteopenia   . Post concussion syndrome    fell 10/20/2015  . SMAS (superior mesenteric artery syndrome) (HCC)   . Urge urinary incontinence     Past Surgical History:  Procedure Laterality Date  . DILATION AND CURETTAGE OF UTERUS  2005  . INTERSTIM IMPLANT PLACEMENT N/A 10/18/2015   Procedure: [name] IMPLANT FIRST STAGE;  Surgeon: Jerilee Field, MD;  Location: [facility];  Service: Urology;  Laterality: N/A;  . INTERSTIM IMPLANT PLACEMENT N/A 10/18/2015   Procedure: [name] IMPLANT SECOND STAGE;  Surgeon: Jerilee Field, MD;  Location: [facility];  Service: Urology;  Laterality: N/A;  . LAPAROSCOPIC GASTRIC RESTRICTIVE DUODENAL PROCEDURE (DUODENAL SWITCH)  12/ 2003  . ORIF RIGHT WRIST FX  2013   REMOVAL HARDWARE X2 in  2013--  No retained hardware    Family Psychiatric History:  Family History  Problem Relation Age of Onset  . Alcohol abuse Mother   . Anxiety disorder Mother   . Depression Mother        severe with hx of suicide attempt. treated with ECT  . COPD Mother   . Cancer Mother  Cervical  . Anxiety disorder Father   . Drug abuse Father   . COPD Father   . Alcohol abuse Maternal Uncle   . Anxiety disorder Maternal Grandmother   . Depression Maternal Grandmother   . ADD / ADHD Brother   . Breast cancer Paternal Grandmother 36    Social History:  Social History   Socioeconomic History  . Marital status: Divorced    Spouse name: Not on file  . Number of children: 0  . Years of education: 58  . Highest education level: Not on file  Occupational  History  . Occupation: Disability  Social Needs  . Financial resource strain: Not on file  . Food insecurity:    Worry: Not on file    Inability: Not on file  . Transportation needs:    Medical: Yes    Non-medical: Yes  Tobacco Use  . Smoking status: Former Smoker    Packs/day: 0.50    Years: 1.00    Pack years: 0.50    Types: Cigarettes    Last attempt to quit: 12/03/2016    Years since quitting: 1.4  . Smokeless tobacco: Never Used  Substance and Sexual Activity  . Alcohol use: No    Alcohol/week: 0.0 standard drinks  . Drug use: No    Frequency: 7.0 times per week    Types: Marijuana, "Crack" cocaine, Cocaine    Comment: hx of and last used cocaine 2012. she was smoking marjiuana 7g/week. smoking thru out the day but quit 4 months ago  . Sexual activity: Not Currently    Comment: 1st intercourse 42 yo-Fewer than 5 partners  Lifestyle  . Physical activity:    Days per week: 0 days    Minutes per session: 0 min  . Stress: Not on file  Relationships  . Social connections:    Talks on phone: Not on file    Gets together: Not on file    Attends religious service: Not on file    Active member of club or organization: Not on file    Attends meetings of clubs or organizations: Not on file    Relationship status: Not on file  Other Topics Concern  . Not on file  Social History Narrative   Fun: Play piano; Build Lego   Denies abuse and feels safe at home. Living with friend in [place].     Allergies:  Allergies  Allergen Reactions  . Bactrim [Sulfamethoxazole-Trimethoprim] Other (See Comments)    Yeast infection  . Penicillins Other (See Comments)    Has patient had a PCN reaction causing immediate rash, facial/tongue/throat swelling, SOB or lightheadedness with hypotension: NO (UTI, YEAST INFECTION) Has patient had a PCN reaction causing severe rash involving mucus membranes or skin necrosis: NO Has patient had a PCN reaction that required hospitalization NO Has  patient had a PCN reaction occurring within the last 10 years: NO If all of the above answers are "NO", then may proceed with Cephalosporin use.   . Ciprofloxacin Itching  . Morphine And Related Itching and Other (See Comments)    "whole body feels tight feeling"  . Reglan [Metoclopramide]   . Benadryl [Diphenhydramine Hcl] Palpitations  . Diflucan [Fluconazole] Rash    Metabolic Disorder Labs: No results found for: HGBA1C, MPG No results found for: PROLACTIN Lab Results  Component Value Date   CHOL 197 02/16/2018   TRIG 67.0 02/16/2018   HDL 58.90 02/16/2018   CHOLHDL 3 02/16/2018   VLDL 13.4 02/16/2018  LDLCALC 125 (H) 02/16/2018   LDLCALC 139 (H) 05/21/2017   Lab Results  Component Value Date   TSH 3.11 02/16/2018   TSH 3.05 05/21/2017    Therapeutic Level Labs: No results found for: LITHIUM No results found for: VALPROATE No components found for:  CBMZ  Current Medications: Current Outpatient Medications  Medication Sig Dispense Refill  . Calcium-Iron-Vit D-Vit K (CALCIUM SOFT CHEWS) [id number] MG-UNT-MCG CHEW Chew by mouth daily.    . Cholecalciferol (VITAMIN D3) 2000 units TABS Take 1 tablet by mouth daily. 30 tablet 5  . dicyclomine (BENTYL) 10 MG capsule TAKE 1 CAPSULE BY MOUTH FOUR TIMES DAILY BEFORE MEALS AND AT BEDTIME 120 capsule 0  . ibuprofen (ADVIL,MOTRIN) 600 MG tablet Take 1 tablet (600 mg total) by mouth every 8 (eight) hours as needed. 90 tablet 2  . lamoTRIgine (LAMICTAL) 150 MG tablet Take 2 tablets (300 mg total) by mouth daily. 60 tablet 3  . Multiple Vitamins-Minerals (MULTIVITAMIN GUMMIES ADULT) CHEW Chew by mouth.    . omega-3 fish oil (MAXEPA) 1000 MG CAPS capsule Take 1 capsule by mouth daily.    . pantoprazole (PROTONIX) 40 MG tablet Take 1 tablet (40 mg total) by mouth every morning. 90 tablet 3  . promethazine (PHENERGAN) 25 MG tablet TAKE 1 TABLET(25 MG) BY MOUTH EVERY 6 HOURS AS NEEDED FOR NAUSEA OR VOMITING 30 tablet 0  . ranitidine  (ZANTAC) 150 MG tablet TAKE 1 TABLET BY MOUTH EVERY MORNING 90 tablet 0  . rosuvastatin (CRESTOR) 10 MG tablet Take 1 tablet (10 mg total) by mouth daily. 90 tablet 3  . albuterol (PROAIR HFA) 108 (90 Base) MCG/ACT inhaler Inhale 2 puffs into the lungs every 6 (six) hours as needed for wheezing or shortness of breath. (Patient not taking: Reported on 05/07/2018) 1 Inhaler 3  . lactose free nutrition (BOOST) LIQD Take 237 mLs by mouth 3 (three) times daily between meals. (Patient not taking: Reported on 05/07/2018) 63990 mL 11  . omeprazole (PRILOSEC) 40 MG capsule Take 40 mg by mouth.     No current facility-administered medications for this visit.      Musculoskeletal: Strength & Muscle Tone: within normal limits Gait & Station: normal Patient leans: N/A  Psychiatric Specialty Exam: Review of Systems  Constitutional: Positive for malaise/fatigue. Negative for chills and fever.  Respiratory: Negative for cough, sputum production and shortness of breath.     Blood pressure 119/72, pulse (!) 112, resp. rate 18, weight 100 lb 3.2 oz (45.5 kg), last menstrual period 05/07/2018, SpO2 95 %.Body mass index is 17.75 kg/m.  General Appearance: Disheveled and wearing her pj, hair not combed  Eye Contact:  Good  Speech:  Clear and Coherent and Normal Rate  Volume:  Normal  Mood:  Euthymic  Affect:  Full Range  Thought Process:  Goal Directed and Descriptions of Associations: Intact  Orientation:  Full (Time, Place, and Person)  Thought Content:  Logical  Suicidal Thoughts:  No  Homicidal Thoughts:  No  Memory:  Immediate;   Good  Judgement:  Good  Insight:  Good  Psychomotor Activity:  Normal  Concentration:  Concentration: Good  Recall:  Good  Fund of Knowledge:  Good  Language:  Good  Akathisia:  No  Handed:  Right  AIMS (if indicated):     Assets:  Communication Skills Desire for Improvement Housing Social Support  ADL's:  Intact  Cognition:  WNL  Sleep:   good      Screenings: PHQ2-9  Office Visit from 02/16/2018 in [facility] Office Visit from 11/14/2016 in [facility]  PHQ-2 Total Score  0  0      I have reviewed the information below on 05/07/2018 and have updated it Assessment and Plan: MDD-recurrent, severe without psychotic features versus bipolar 2 disorder Anorexia nervosa Impulse control disorder GAD ADHD-inattentive type Insomnia Cannabis use disorder    Medication management with supportive therapy. Risks and benefits, side effects and alternative treatment options discussed with patient. Pt was given an opportunity to ask questions about medication, illness, and treatment. All current psychiatric medications have been reviewed and discussed with the patient and adjusted as clinically appropriate. The patient has been provided an accurate and updated list of the medications being now prescribed. Patient expressed understanding of how their medications were to be used.  Pt verbalized understanding and verbal consent obtained for treatment.  The risk of un-intended pregnancy is low based on the fact that pt reports she is not using any form of contraceptives but is not sexually active at this time. Pt is aware that these meds carry a teratogenic risk. Pt will discuss plan of action if she does or plans to become pregnant in the future.  Status of current problems: stable  Meds: Lamictal 300 mg p.o. daily for MDD versus bipolar disorder, impulse control disorder Patient is managing symptoms of anorexia, GAD, ADHD, insomnia on her own   Labs: 02/16/18: CMP wnl, lipid panel wnl, CBC wnl, tsh wnl  Therapy: brief supportive therapy provided. Discussed psychosocial stressors in detail.   Encouraged pt to develop daily routine and work on daily goal setting as a way to improve mood symptoms.  I declined pt's request to fill out paperwork for her cat to travel by plane as an  emotional support animal due to lack of animal training  Consultations: Encouraged to follow up with PCP as needed  Pt denies SI and is at an acute low risk for suicide. Patient told to call clinic if any problems occur. Patient advised to go to ER if they should develop SI/HI, side effects, or if symptoms worsen. Has crisis numbers to call if needed. Pt verbalized understanding.  F/up in 3 months or sooner if needed    Oletta Darter, MD 05/07/2018, 4:09 PM

## 2018-05-08 ENCOUNTER — Ambulatory Visit (INDEPENDENT_AMBULATORY_CARE_PROVIDER_SITE_OTHER)
Admission: RE | Admit: 2018-05-08 | Discharge: 2018-05-08 | Disposition: A | Discharge status: Home / Self Care | Payer: Medicare Other | Source: Ambulatory Visit | Attending: Family | Admitting: Family

## 2018-05-08 ENCOUNTER — Ambulatory Visit (INDEPENDENT_AMBULATORY_CARE_PROVIDER_SITE_OTHER): Payer: Medicare Other | Admitting: Family

## 2018-05-08 ENCOUNTER — Other Ambulatory Visit: Payer: Self-pay | Admitting: Internal Medicine

## 2018-05-08 ENCOUNTER — Encounter: Payer: Self-pay | Admitting: Family

## 2018-05-08 ENCOUNTER — Other Ambulatory Visit (INDEPENDENT_AMBULATORY_CARE_PROVIDER_SITE_OTHER): Payer: Medicare Other

## 2018-05-08 VITALS — BP 106/60 | HR 113 | Temp 98.4°F | Ht 63.0 in | Wt 97.1 lb

## 2018-05-08 DIAGNOSIS — R634 Abnormal weight loss: Secondary | ICD-10-CM

## 2018-05-08 DIAGNOSIS — R899 Unspecified abnormal finding in specimens from other organs, systems and tissues: Secondary | ICD-10-CM

## 2018-05-08 LAB — CBC WITH DIFFERENTIAL/PLATELET
Basophils Absolute: 0 10*3/uL (ref 0.0–0.1)
Basophils Relative: 1.1 % (ref 0.0–3.0)
Eosinophils Absolute: 0.1 10*3/uL (ref 0.0–0.7)
Eosinophils Relative: 2.7 % (ref 0.0–5.0)
HCT: 36.5 % (ref 36.0–46.0)
Hemoglobin: 12.5 g/dL (ref 12.0–15.0)
Lymphocytes Relative: 46.9 % — ABNORMAL HIGH (ref 12.0–46.0)
Lymphs Abs: 2.1 10*3/uL (ref 0.7–4.0)
MCHC: 34.2 g/dL (ref 30.0–36.0)
MCV: 86.8 fl (ref 78.0–100.0)
Monocytes Absolute: 0.2 10*3/uL (ref 0.1–1.0)
Monocytes Relative: 5.3 % (ref 3.0–12.0)
Neutro Abs: 2 10*3/uL (ref 1.4–7.7)
Neutrophils Relative %: 44 % (ref 43.0–77.0)
Platelets: 276 10*3/uL (ref 150.0–400.0)
RBC: 4.21 Mil/uL (ref 3.87–5.11)
RDW: 12.2 % (ref 11.5–15.5)
WBC: 4.5 10*3/uL (ref 4.0–10.5)

## 2018-05-08 LAB — COMPREHENSIVE METABOLIC PANEL
ALT: 12 U/L (ref 0–35)
AST: 17 U/L (ref 0–37)
Albumin: 4.7 g/dL (ref 3.5–5.2)
Alkaline Phosphatase: 67 U/L (ref 39–117)
BUN: 13 mg/dL (ref 6–23)
CO2: 30 mEq/L (ref 19–32)
Calcium: 9.9 mg/dL (ref 8.4–10.5)
Chloride: 104 mEq/L (ref 96–112)
Creatinine, Ser: 0.85 mg/dL (ref 0.40–1.20)
GFR: 78.82 mL/min (ref >60.00)
Glucose, Bld: 115 mg/dL — ABNORMAL HIGH (ref 70–99)
Potassium: 4.5 mEq/L (ref 3.5–5.1)
Sodium: 140 mEq/L (ref 135–145)
Total Bilirubin: 0.5 mg/dL (ref 0.2–1.2)
Total Protein: 7.7 g/dL (ref 6.0–8.3)

## 2018-05-08 LAB — AMYLASE: Amylase: 41 U/L (ref 27–131)

## 2018-05-08 LAB — TSH: TSH: 2.92 u[IU]/mL (ref 0.35–4.50)

## 2018-05-08 LAB — LIPASE: Lipase: 22 U/L (ref 11.0–59.0)

## 2018-05-08 NOTE — Progress Notes (Signed)
Karen Hahn is a 42 y.o. female with the following history as recorded in EpicCare:  Patient Active Problem List   Diagnosis Date Noted  . HLD (hyperlipidemia) 02/16/2018  . URI (upper respiratory infection) 10/14/2017  . Preventative health care 05/21/2017  . UTI (urinary tract infection) 05/21/2017  . Thrush 05/21/2017  . Vitamin D deficiency 05/21/2017  . Osteopenia 05/21/2017  . Dyspnea 05/21/2017  . Hematuria 01/01/2017  . Lump in female breast 01/01/2017  . Fatigue 01/01/2017  . Candida vaginitis 01/01/2017  . Dysuria 12/19/2016  . Gastroesophageal reflux disease with esophagitis 11/14/2016  . Irritable bowel syndrome with constipation 11/14/2016  . Severe episode of recurrent major depressive disorder, without psychotic features ([place]) 08/24/2015  . Cough 11/15/2014  . Major depressive disorder, recurrent episode, moderate ([place]) 07/14/2014  . ADD (attention deficit hyperactivity disorder, inattentive type) 07/14/2014  . Impulse control disorder 07/14/2014  . Depressive disorder, not elsewhere classified 03/08/2014  . GAD (generalized anxiety disorder) 03/08/2014  . Anorexia nervosa 11/17/2013  . Impulse control disorder, unspecified 11/17/2013  . Cannabis abuse 11/17/2013  . Insomnia 11/17/2013    Current Outpatient Medications  Medication Sig Dispense Refill  . Calcium-Iron-Vit D-Vit K (CALCIUM SOFT CHEWS) 500-08-998-40 MG-UNT-MCG CHEW Chew by mouth daily.    . Cholecalciferol (VITAMIN D3) 2000 units TABS Take 1 tablet by mouth daily. 30 tablet 5  . dicyclomine (BENTYL) 10 MG capsule TAKE 1 CAPSULE BY MOUTH FOUR TIMES DAILY BEFORE MEALS AND AT BEDTIME 120 capsule 0  . ibuprofen (ADVIL,MOTRIN) 600 MG tablet Take 1 tablet (600 mg total) by mouth every 8 (eight) hours as needed. 90 tablet 2  . lamoTRIgine (LAMICTAL) 150 MG tablet Take 2 tablets (300 mg total) by mouth daily. 60 tablet 3  . Multiple Vitamins-Minerals (MULTIVITAMIN GUMMIES ADULT) CHEW Chew by mouth.     . omega-3 fish oil (MAXEPA) 1000 MG CAPS capsule Take 1 capsule by mouth daily.    . pantoprazole (PROTONIX) 40 MG tablet Take 1 tablet (40 mg total) by mouth every morning. 90 tablet 3  . promethazine (PHENERGAN) 25 MG tablet TAKE 1 TABLET(25 MG) BY MOUTH EVERY 6 HOURS AS NEEDED FOR NAUSEA OR VOMITING 30 tablet 0  . ranitidine (ZANTAC) 150 MG tablet TAKE 1 TABLET BY MOUTH EVERY MORNING 90 tablet 0  . rosuvastatin (CRESTOR) 10 MG tablet Take 1 tablet (10 mg total) by mouth daily. 90 tablet 3   No current facility-administered medications for this visit.     Allergies: Bactrim [sulfamethoxazole-trimethoprim]; Penicillins; Ciprofloxacin; Morphine and related; Reglan [metoclopramide]; Benadryl [diphenhydramine hcl]; and Diflucan [fluconazole]  Past Medical History:  Diagnosis Date  . ADD (attention deficit disorder)   . Anorexia nervosa without bulimia   . Chronic constipation   . Drug abuse ([place])   . GAD (generalized anxiety disorder)   . GERD (gastroesophageal reflux disease)   . History of gastric restrictive surgery   . History of gastric ulcer   . History of Helicobacter pylori infection    after 2003  . HLD (hyperlipidemia) 02/16/2018  . Impulse control disorder   . Moderate major depression ([place])   . Osteopenia   . Post concussion syndrome    fell 10/20/2015  . SMAS (superior mesenteric artery syndrome) ([place])   . Urge urinary incontinence     Past Surgical History:  Procedure Laterality Date  . DILATION AND CURETTAGE OF UTERUS  2005  . INTERSTIM IMPLANT PLACEMENT N/A 10/18/2015   Procedure: Barrie Lyme IMPLANT FIRST STAGE;  Surgeon: Festus Aloe, MD;  Location: [facility];  Service: Urology;  Laterality: N/A;  . INTERSTIM IMPLANT PLACEMENT N/A 10/18/2015   Procedure: Barrie Lyme IMPLANT SECOND STAGE;  Surgeon: Festus Aloe, MD;  Location: [facility];  Service: Urology;  Laterality: N/A;  . LAPAROSCOPIC GASTRIC RESTRICTIVE DUODENAL PROCEDURE  (DUODENAL SWITCH)  12/ 2003  . ORIF RIGHT WRIST FX  2013   REMOVAL HARDWARE X2 in  2013--  No retained hardware    Family History  Problem Relation Age of Onset  . Alcohol abuse Mother   . Anxiety disorder Mother   . Depression Mother        severe with hx of suicide attempt. treated with ECT  . COPD Mother   . Cancer Mother        Cervical  . Anxiety disorder Father   . Drug abuse Father   . COPD Father   . Alcohol abuse Maternal Uncle   . Anxiety disorder Maternal Grandmother   . Depression Maternal Grandmother   . ADD / ADHD Brother   . Breast cancer Paternal Grandmother 40    Social History   Tobacco Use  . Smoking status: Former Smoker    Packs/day: 0.50    Years: 1.00    Pack years: 0.50    Types: Cigarettes    Last attempt to quit: 12/03/2016    Years since quitting: 1.4  . Smokeless tobacco: Never Used  Substance Use Topics  . Alcohol use: No    Alcohol/week: 0.0 standard drinks    Subjective:  Presents with concerns for unexplained weight loss; has documented eating disorder; admits however, she is eating 3 meals per day/ drinking protein shake; no increased exercise/ one cup of coffee; Sleeping well; not using alcohol or tobacco;  Up to date on GYN care; up to date on mammogram; Not sexually active in 4-5 years/ last HIV done in 2018;   LMP- now;   Objective:  Vitals:   05/08/18 1307  BP: 106/60  Pulse: (!) 113  Temp: 98.4 F (36.9 C)  TempSrc: Oral  SpO2: 98%  Weight: 97 lb 1.9 oz (44.1 kg)  Height: '5\' 3"'$  (1.6 m)    General: Well developed, well nourished, in no acute distress  Skin : Warm and dry.  Head: Normocephalic and atraumatic  Eyes: Sclera and conjunctiva clear; pupils round and reactive to light; extraocular movements intact  Ears: External normal; canals clear; tympanic membranes normal  Oropharynx: Pink, supple. No suspicious lesions  Neck: Supple without thyromegaly, adenopathy  Lungs: Respirations unlabored; clear to auscultation  bilaterally without wheeze, rales, rhonchi  CVS exam: normal rate and regular rhythm.  Neurologic: Alert and oriented; speech intact; face symmetrical; moves all extremities well; CNII-XII intact without focal deficit   Assessment:  1. Unexplained weight loss   2. Abnormal laboratory test result     Plan:  ? Etiology; update labs and CXR today; refer to GI and nutritionist; follow-up to be determined.   No follow-ups on file.  Orders Placed This Encounter  Procedures  . DG Chest 2 View    Standing Status:   Future    Number of Occurrences:   1    Standing Expiration Date:   07/09/2019    Order Specific Question:   Reason for Exam (SYMPTOM  OR DIAGNOSIS REQUIRED)    Answer:   weight loss    Order Specific Question:   Is patient pregnant?    Answer:   No    Order Specific Question:  Preferred imaging location?    Answer:   [facility]    Order Specific Question:   Radiology Contrast Protocol - do NOT remove file path    Answer:   [email or web address]  . CBC w/Diff    Standing Status:   Future    Number of Occurrences:   1    Standing Expiration Date:   05/08/2019  . Comp Met (CMET)    Standing Status:   Future    Number of Occurrences:   1    Standing Expiration Date:   05/08/2019  . TSH    Standing Status:   Future    Number of Occurrences:   1    Standing Expiration Date:   05/08/2019  . Amylase    Standing Status:   Future    Number of Occurrences:   1    Standing Expiration Date:   05/08/2019  . Lipase    Standing Status:   Future    Number of Occurrences:   1    Standing Expiration Date:   05/08/2019  . Ambulatory referral to Gastroenterology    Referral Priority:   Routine    Referral Type:   Consultation    Referral Reason:   Specialty Services Required    Number of Visits Requested:   1  . Referral to Nutrition and Diabetes Services    Referral Priority:   Routine    Referral Type:   Consultation    Referral Reason:    Specialty Services Required    Number of Visits Requested:   1    Requested Prescriptions    No prescriptions requested or ordered in this encounter

## 2018-05-11 ENCOUNTER — Telehealth: Payer: Self-pay

## 2018-05-11 NOTE — Telephone Encounter (Signed)
Copied from CRM [id number]. Topic: General - Other >> May 11, 2018 11:55 AM Karen Hahn wrote: Reason for CRM:   Patient would like the results of her recent xrays

## 2018-05-12 ENCOUNTER — Encounter: Payer: Self-pay | Admitting: Internal Medicine

## 2018-05-12 ENCOUNTER — Ambulatory Visit (INDEPENDENT_AMBULATORY_CARE_PROVIDER_SITE_OTHER): Payer: Medicare Other | Admitting: Internal Medicine

## 2018-05-12 VITALS — BP 92/60 | HR 78 | Ht 63.0 in | Wt 102.0 lb

## 2018-05-12 DIAGNOSIS — F5000 Anorexia nervosa, unspecified: Secondary | ICD-10-CM

## 2018-05-12 DIAGNOSIS — K219 Gastro-esophageal reflux disease without esophagitis: Secondary | ICD-10-CM

## 2018-05-12 DIAGNOSIS — R634 Abnormal weight loss: Secondary | ICD-10-CM | POA: Diagnosis not present

## 2018-05-12 NOTE — Telephone Encounter (Signed)
Not sure what other results she is looking for? we told her last week that her labs and CXR were normal.

## 2018-05-12 NOTE — Patient Instructions (Signed)

## 2018-05-12 NOTE — Telephone Encounter (Signed)
Message sent to patient via mychart

## 2018-05-14 ENCOUNTER — Encounter: Payer: Self-pay | Admitting: Internal Medicine

## 2018-05-14 NOTE — Progress Notes (Addendum)
HISTORY OF PRESENT ILLNESS:  Karen Hahn is a pleasant 42 y.o. female who was referred by Karen Clock, FNP regarding weight loss or inability to gain weight questioning a malabsorption disorder.  The patient's history is most significant for multiple behavioral health issues including anorexia nervosa, generalized anxiety disorder, impulse control disorder, chronic cannabis use, borderline personality traits and depression.  I have reviewed the patient's record.  At one point her weight was as low as 70 pounds.  Her weight in 2016 was 115 pounds.  Her weight in 2018 was 100 pounds.  Her weight in July 2019 was 104 pounds.  Her weight today is 102 pounds.  Patient tells me that she has been eating well.  Consuming wheat protein.  Despite this she has not gained weight.  At one point she was diagnosed SMA syndrome.  Patient tells me that she has had colonoscopy and upper endoscopy with Dr. Carman Hahn.  We have requested those records for review.  Patient is disabled due to her mental health issues.  She denies vomiting, diarrhea, or steatorrhea.  She describes her bowel habits is regular with occasional constipation.  For occasional nausea promethazine in the morning helps.  For history of reflux and reflux symptoms Protonix helps.  Review of outside laboratories from May 08, 2018 finds unremarkable comprehensive metabolic panel including serum protein of 7.7 and serum albumin level of 4.7.  Most recent cholesterol 197.  CBC normal with hemoglobin 12.5.  Normal TSH.  Review of previous imaging including CT scan of the abdomen and pelvis September 2016 to evaluate abdominal discomfort or pain revealed mild distention of the urinary bladder but was otherwise normal.  REVIEW OF SYSTEMS:  All non-GI ROS negative except for  Past Medical History:  Diagnosis Date  . ADD (attention deficit disorder)   . Anorexia nervosa without bulimia   . Chronic constipation   . Drug abuse ([facility])   . GAD  (generalized anxiety disorder)   . GERD (gastroesophageal reflux disease)   . History of gastric restrictive surgery   . History of gastric ulcer   . History of Helicobacter pylori infection    after 2003  . HLD (hyperlipidemia) 02/16/2018  . Impulse control disorder   . Moderate major depression ([facility])   . Osteopenia   . Post concussion syndrome    fell 10/20/2015  . SMAS (superior mesenteric artery syndrome) ([facility])   . Urge urinary incontinence     Past Surgical History:  Procedure Laterality Date  . DILATION AND CURETTAGE OF UTERUS  2005  . INTERSTIM IMPLANT PLACEMENT N/A 10/18/2015   Procedure: [name] IMPLANT FIRST STAGE;  Surgeon: Jerilee Field, MD;  Location: [facility];  Service: Urology;  Laterality: N/A;  . INTERSTIM IMPLANT PLACEMENT N/A 10/18/2015   Procedure: [name] IMPLANT SECOND STAGE;  Surgeon: Jerilee Field, MD;  Location: [facility];  Service: Urology;  Laterality: N/A;  . LAPAROSCOPIC GASTRIC RESTRICTIVE DUODENAL PROCEDURE (DUODENAL SWITCH)  12/ 2003  . ORIF RIGHT WRIST FX  2013   REMOVAL HARDWARE X2 in  2013--  No retained hardware    Social History Karen Hahn  reports that she quit smoking about 17 months ago. Her smoking use included cigarettes. She has a 0.50 pack-year smoking history. She has never used smokeless tobacco. She reports that she does not drink alcohol or use drugs.  family history includes ADD / ADHD in her brother; Alcohol abuse in her maternal uncle and mother; Anxiety disorder in her  father, maternal grandmother, and mother; Breast cancer (age of onset: 50) in her paternal grandmother; COPD in her father and mother; Cancer in her mother; Depression in her maternal grandmother and mother; Drug abuse in her father.  Allergies  Allergen Reactions  . Bactrim [Sulfamethoxazole-Trimethoprim] Other (See Comments)    Yeast infection  . Penicillins Other (See Comments)    Has patient had a PCN reaction  causing immediate rash, facial/tongue/throat swelling, SOB or lightheadedness with hypotension: NO (UTI, YEAST INFECTION) Has patient had a PCN reaction causing severe rash involving mucus membranes or skin necrosis: NO Has patient had a PCN reaction that required hospitalization NO Has patient had a PCN reaction occurring within the last 10 years: NO If all of the above answers are "NO", then may proceed with Cephalosporin use.   . Ciprofloxacin Itching  . Morphine And Related Itching and Other (See Comments)    "whole body feels tight feeling"  . Reglan [Metoclopramide]   . Benadryl [Diphenhydramine Hcl] Palpitations  . Diflucan [Fluconazole] Rash       PHYSICAL EXAMINATION: Vital signs: BP 92/60   Pulse 78   Ht 5\' 3"  (1.6 m)   Wt 102 lb (46.3 kg)   LMP 05/07/2018 (Exact Date)   BMI 18.07 kg/m   Constitutional: Quite thin but otherwise generally well-appearing, no acute distress Psychiatric: alert and oriented x3, cooperative Eyes: extraocular movements intact, anicteric, conjunctiva pink Mouth: oral pharynx moist, no lesions Neck: supple no lymphadenopathy Cardiovascular: heart regular rate and rhythm, no murmur Lungs: clear to auscultation bilaterally Abdomen: soft, nontender, nondistended, no obvious ascites, no peritoneal signs, normal bowel sounds, no organomegaly Rectal: Omitted Extremities: no clubbing, cyanosis, or lower extremity edema bilaterally Skin: no lesions on visible extremities Neuro: No focal deficits.  Cranial nerves intact.  Normal DTRs  ASSESSMENT:  1.  Weight loss.  Actually her weight has been quite stable over the past year.  Issues with eating disorder as outlined above.  I see no objective evidence for malabsorption.  Normal laboratories including albumin are quite reassuring. 2.  GERD by history.  In addition intermittent nausea and bloating 3.  History of SMA syndrome 4.  History of anorexia nervosa   PLAN:  1.  EGD with biopsies to  evaluate dyspeptic symptoms and inability to gain weight 2.  Obtain outside GI records for review 3.  If evaluation negative, then no further GI assessment at this time warranted.  She would return to the care of her primary provider  ADDENDUM June 30, 2018: Received records from [facility] in [place].  For a preoperative diagnosis of superior mesenteric artery syndrome the patient underwent exploratory laparotomy with gastroenterostomy and removal of bezoar with Dr. [place]  ADDITIONAL ADDENDUM July 13, 2018: Received records from [facility].  She was under the care of Dr. [place] heights 2015 through 2017.  Additional procedural information as listed below. 1.  Colonoscopy May 16, 2015: NORMAL 2.  Upper endoscopy May 16, 2015: NORMAL 3.  Contrast-enhanced CT scan of the abdomen and pelvis April 25, 2015.:  Mildly distended urinary bladder.  Otherwise normal

## 2018-05-21 ENCOUNTER — Encounter: Payer: Self-pay | Admitting: Internal Medicine

## 2018-05-22 ENCOUNTER — Encounter: Payer: Self-pay | Admitting: Internal Medicine

## 2018-05-22 ENCOUNTER — Ambulatory Visit: Payer: Self-pay | Admitting: Internal Medicine

## 2018-06-02 ENCOUNTER — Ambulatory Visit: Payer: Self-pay | Admitting: Registered"

## 2018-06-03 ENCOUNTER — Encounter: Payer: Self-pay | Admitting: Internal Medicine

## 2018-06-03 ENCOUNTER — Ambulatory Visit (AMBULATORY_SURGERY_CENTER): Payer: Medicare Other | Admitting: Internal Medicine

## 2018-06-03 VITALS — BP 99/65 | HR 72 | Temp 99.3°F | Resp 10 | Ht 63.0 in | Wt 102.0 lb

## 2018-06-03 DIAGNOSIS — K2990 Gastroduodenitis, unspecified, without bleeding: Secondary | ICD-10-CM

## 2018-06-03 DIAGNOSIS — R634 Abnormal weight loss: Secondary | ICD-10-CM

## 2018-06-03 DIAGNOSIS — K2970 Gastritis, unspecified, without bleeding: Secondary | ICD-10-CM

## 2018-06-03 DIAGNOSIS — K219 Gastro-esophageal reflux disease without esophagitis: Secondary | ICD-10-CM

## 2018-06-03 DIAGNOSIS — K2980 Duodenitis without bleeding: Secondary | ICD-10-CM

## 2018-06-03 MED ORDER — SODIUM CHLORIDE 0.9 % IV SOLN: 500.0000 mL | Freq: Once | INTRAVENOUS | Status: DC

## 2018-06-03 NOTE — Progress Notes (Signed)
Called to room to assist during endoscopic procedure.  Patient ID and intended procedure confirmed with present staff. Received instructions for my participation in the procedure from the performing physician.  

## 2018-06-03 NOTE — Patient Instructions (Signed)
YOU HAD AN ENDOSCOPIC PROCEDURE TODAY AT THE [facility]:   Refer to the procedure report that was given to you for any specific questions about what was found during the examination.  If the procedure report does not answer your questions, please call your gastroenterologist to clarify.  If you requested that your care partner not be given the details of your procedure findings, then the procedure report has been included in a sealed envelope for you to review at your convenience later.  YOU SHOULD EXPECT: Some feelings of bloating in the abdomen. Passage of more gas than usual.  Walking can help get rid of the air that was put into your GI tract during the procedure and reduce the bloating. If you had a lower endoscopy (such as a colonoscopy or flexible sigmoidoscopy) you may notice spotting of blood in your stool or on the toilet paper. If you underwent a bowel prep for your procedure, you may not have a normal bowel movement for a few days.  Please Note:  You might notice some irritation and congestion in your nose or some drainage.  This is from the oxygen used during your procedure.  There is no need for concern and it should clear up in a day or so.  SYMPTOMS TO REPORT IMMEDIATELY:   Following upper endoscopy (EGD)  Vomiting of blood or coffee ground material  New chest pain or pain under the shoulder blades  Painful or persistently difficult swallowing  New shortness of breath  Fever of 100F or higher  Black, tarry-looking stools  For urgent or emergent issues, a gastroenterologist can be reached at any hour by calling [phone].   DIET:  We do recommend a small meal at first, but then you may proceed to your regular diet.  Drink plenty of fluids but you should avoid alcoholic beverages for 24 hours.  ACTIVITY:  You should plan to take it easy for the rest of today and you should NOT DRIVE or use heavy machinery until tomorrow (because of the sedation medicines used  during the test).    FOLLOW UP: Our staff will call the number listed on your records the next business day following your procedure to check on you and address any questions or concerns that you may have regarding the information given to you following your procedure. If we do not reach you, we will leave a message.  However, if you are feeling well and you are not experiencing any problems, there is no need to return our call.  We will assume that you have returned to your regular daily activities without incident.  If any biopsies were taken you will be contacted by phone or by letter within the next 1-3 weeks.  Please call us at [phone] if you have not heard about the biopsies in 3 weeks.    SIGNATURES/CONFIDENTIALITY: You and/or your care partner have signed paperwork which will be entered into your electronic medical record.  These signatures attest to the fact that that the information above on your After Visit Summary has been reviewed and is understood.  Full responsibility of the confidentiality of this discharge information lies with you and/or your care-partner.   Thank you for allowing us to provide your healthcare today.  

## 2018-06-03 NOTE — Op Note (Signed)
[facility] Patient Name: Karen Hahn Procedure Date: 06/03/2018 4:40 PM MRN: [id number] Endoscopist: Wilhemina Bonito. Marina Goodell , MD Age: 42 Referring MD:  Date of Birth: 04/15/1976 Gender: Female Account #: [id number] Procedure:                Upper GI endoscopy with biopsies Indications:              Dyspepsia inability to gain weight, previous                            gastric surgery without available details Medicines:                Monitored Anesthesia Care Procedure:                Pre-Anesthesia Assessment:                           - Prior to the procedure, a History and Physical                            was performed, and patient medications and                            allergies were reviewed. The patient's tolerance of                            previous anesthesia was also reviewed. The risks                            and benefits of the procedure and the sedation                            options and risks were discussed with the patient.                            All questions were answered, and informed consent                            was obtained. Prior Anticoagulants: The patient has                            taken no previous anticoagulant or antiplatelet                            agents. ASA Grade Assessment: II - A patient with                            mild systemic disease. After reviewing the risks                            and benefits, the patient was deemed in                            satisfactory condition to undergo the procedure.  After obtaining informed consent, the endoscope was                            passed under direct vision. Throughout the                            procedure, the patient's blood pressure, pulse, and                            oxygen saturations were monitored continuously. The                            Model GIF-[id number] scope was introduced   through the mouth, and advanced to the second part                            of duodenum. The upper GI endoscopy was                            accomplished without difficulty. The patient                            tolerated the procedure well. Scope In: Scope Out: Findings:                 The esophagus was normal.                           The stomach revealed evidence of prior surgery with                            a gastroenteric anastomosis that was widely patent.                            Both afferent and efferent limbs were entered.                           The examined duodenum revealed mild erythema                            consistent with mild duodenitis. Biopsies were                            taken with a cold forceps for Helicobacter pylori                            testing using CLOtest.                           The cardia and gastric fundus were normal on                            retroflexion. Complications:            No immediate complications. Estimated Blood Loss:     Estimated blood loss: none. Impression:  1. Prior gastric surgery with small bowel limbs as                            described                           2. Mild duodenitis                           3. Otherwise normal exam. Recommendation:           1. Obtain outside surgical records from gastric                            surgery for review (question related to prior SMA                            syndrome)                           2. Resume care with your primary provider.                           3. No GI cause for inability to gain weight                            identified. However, patient has not been losing                            weight over the past few years and electrolytes and                            proteins are normal Nalia Honeycutt N. Marina Goodell, MD 06/03/2018 5:13:55 PM This report has been signed electronically.

## 2018-06-03 NOTE — Progress Notes (Signed)
A and O x3. Report to RN. Tolerated MAC anesthesia well.

## 2018-06-04 ENCOUNTER — Telehealth: Payer: Self-pay | Admitting: *Deleted

## 2018-06-04 ENCOUNTER — Telehealth: Payer: Self-pay

## 2018-06-04 ENCOUNTER — Other Ambulatory Visit: Payer: Self-pay | Admitting: Family

## 2018-06-04 LAB — HELICOBACTER PYLORI SCREEN-BIOPSY: UREASE: NEGATIVE

## 2018-06-04 NOTE — Telephone Encounter (Signed)
Attempted to reach pt-lmtcb on VM of [phone])-Dr. Marina Goodell is requesting outside surgical records from gastric surgery for review (question related to prior SMA syndrome);  Name of MD, name of facility, and release of medical records needs to be signed for [place] to obtain these records for her plan of care;

## 2018-06-04 NOTE — Telephone Encounter (Signed)
  Follow up Call-  Call back number 06/03/2018  Post procedure Call Back phone  # [phone]  Permission to leave phone message Yes  Some recent data might be hidden     Patient questions:  Do you have a fever, pain , or abdominal swelling? No. Pain Score  0 *  Have you tolerated food without any problems? Yes.    Have you been able to return to your normal activities? Yes.    Do you have any questions about your discharge instructions: Diet   No. Medications  No. Follow up visit  No.  Do you have questions or concerns about your Care? No.  Actions: * If pain score is 4 or above: No action needed, pain <4.

## 2018-06-04 NOTE — Telephone Encounter (Signed)
NO ANSWER, MESSAGE LEFT FOR PATIENT. 

## 2018-06-09 NOTE — Telephone Encounter (Signed)
Left message on VM for pt to either call office back or to notify the gastric surgery office to have them send the records to [facility];

## 2018-06-10 NOTE — Telephone Encounter (Signed)
This pt has been contacted (via VM) in order to obtain the records that Dr. Marina Goodell is requesting from gastric surgeon related to SMA syndrome; We are awaiting a call back from the pt at this time;

## 2018-06-11 ENCOUNTER — Other Ambulatory Visit: Payer: Self-pay | Admitting: Internal Medicine

## 2018-06-16 ENCOUNTER — Encounter: Payer: Medicare Other | Attending: Internal Medicine | Admitting: Registered"

## 2018-06-16 ENCOUNTER — Encounter: Payer: Self-pay | Admitting: Registered"

## 2018-06-16 DIAGNOSIS — Z713 Dietary counseling and surveillance: Secondary | ICD-10-CM | POA: Diagnosis not present

## 2018-06-16 DIAGNOSIS — R634 Abnormal weight loss: Secondary | ICD-10-CM

## 2018-06-16 NOTE — Patient Instructions (Addendum)
-   [employer] Fri, Nov 22 at 1 pm. See handout for other local food resources.   - Add nutritional shakes to daily routine. At least one a day.   - Choose whole fat dairy products.

## 2018-06-16 NOTE — Progress Notes (Signed)
Appointment start time: 2:48  Appointment end time: 3:29  Patient was seen on 06/16/2018 for nutrition counseling pertaining to disordered eating  Primary care provider: Ria Clock  Assessment  Weight history:  Highest weight: 115 (6 years ago)    Lowest weight: 80 Most consistent weight: 105   How has weight changed in the past year: recently has dropped  Medical Information:  Changes in hair, skin, nails since ED started: no Chewing/swallowing difficulties: no Relux or heartburn: taking protonix  Mental health diagnosis: previous history of anorexia nervosa  Pt states she is drinking a protein drink with 20-30 grams. Pt states she has tried Boost in the past and enjoyed it but unable to afford it now. Pt states she had anorexia nervosa age 89 - early 36's. Pt states she does not feel that her previous eating disorder is problematic at this point. Pt states she has bipolar. Pt states she sees a  psychiatrist every 3 months. Pt states she used to see a therapist but has not seen them in a while. Pt states she had a surgery in 2003 on her intestines so that food could go out another way.   Pt states she lives with 2 roommates and receives $15/month EBT benefits. Pt states they never go without food, when food is running low her roommates dad will cook for them. Pt states she drinks a lot of water at night. Pt states is having a hard time with finances. 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    Dietary assessment: A typical day consists of and 0-1 snacks  Safe foods include: likes a variety of foods Avoided foods include:  24 hour recall:  B: eggs + toast (229) S:  L: sandwich + chips/fries/salad or leftovers (breaked chicken + veggies + starch) or frozen pizza (300) S: protein shake (130) D: breaked chicken + veggies + starch Beverages: water, soda, coffee  Physical activity: ADL's  Estimated  energy intake: ~1000 kcal  Estimated energy needs: 2000-2400 kcal 250-300 g CHO 100-120 g pro 67-80 g fat  Nutrition Diagnosis: NB-3.2 Limited access to food or water As related to  lack of financial resources to purchase a sufficient quanitity of foods.  As evidenced by pt report of no income and limited EBT benefits.  Intervention/Goals: Nutrition education and counseling. Pt was educated on local patient food resources and ways to increase intake. Pt was in agreement with goals listed.  Goals: - [employer] Fri, Nov 22 at 1 pm. See handout for other local food resources.  - Add nutritional shakes to daily routine. At least one a day.  - Choose whole fat dairy products.   Monitoring and Evaluation: Patient will follow up in 2 weeks.

## 2018-06-23 ENCOUNTER — Telehealth: Payer: Self-pay | Admitting: Internal Medicine

## 2018-06-23 NOTE — Telephone Encounter (Signed)
ROI faxed to [facility] for records

## 2018-06-24 ENCOUNTER — Other Ambulatory Visit: Payer: Self-pay | Admitting: Internal Medicine

## 2018-06-26 ENCOUNTER — Telehealth: Payer: Self-pay | Admitting: Internal Medicine

## 2018-06-26 NOTE — Telephone Encounter (Signed)
Rec'd from [facility] forwarded 4 pages to Dr. Yancey Flemings

## 2018-07-01 ENCOUNTER — Other Ambulatory Visit: Payer: Self-pay | Admitting: Internal Medicine

## 2018-07-01 ENCOUNTER — Ambulatory Visit: Payer: Self-pay | Admitting: Registered"

## 2018-07-08 ENCOUNTER — Encounter: Payer: Medicare Other | Attending: Internal Medicine | Admitting: Registered"

## 2018-07-08 ENCOUNTER — Encounter: Payer: Self-pay | Admitting: Registered"

## 2018-07-08 DIAGNOSIS — Z713 Dietary counseling and surveillance: Secondary | ICD-10-CM | POA: Insufficient documentation

## 2018-07-08 DIAGNOSIS — R634 Abnormal weight loss: Secondary | ICD-10-CM | POA: Insufficient documentation

## 2018-07-08 NOTE — Patient Instructions (Addendum)
-   Have carnation instant breakfast between meals + 2 TBS peanut butter.   - Add fruit with breakfast.   - [employer] Fri, Dec. 27 at 1 pm in employee parking lot across for [facility].   - Check multivitamin for iron.

## 2018-07-08 NOTE — Progress Notes (Signed)
Appointment start time: 4:32  Appointment end time: 4:50  Patient was seen on 07/08/2018 for nutrition counseling pertaining to disordered eating  Primary care provider: Ria Clock  Assessment  Weight history:  Highest weight: 115 (6 years ago)    Lowest weight: 80 Most consistent weight: 105   How has weight changed in the past year: recently has dropped  Medical Information:  Changes in hair, skin, nails since ED started: no Chewing/swallowing difficulties: no Relux or heartburn: taking protonix  Mental health diagnosis: previous history of anorexia nervosa  Pt states [facility] [employer] [facility] and received supplemental food. Pt states it has been helping with supplementing between months. Pt states she has been drinking Carnation instant breakfast shakes sometimes with peanut butter. Pt states she has been taking Viactic calcium chews 2x/day, gummy multivitamins, and Vitamin E.   Pt states she is drinking a protein drink with 20-30 grams. Pt states she has tried Boost in the past and enjoyed it but unable to afford it now. Pt states she had anorexia nervosa age 32 - early 40's. Pt states she does not feel that her previous eating disorder is problematic at this point. Pt states she has bipolar. Pt states she sees a  psychiatrist every 3 months. Pt states she used to see a therapist but has not seen them in a while. Pt states she had a surgery in 2003 on her intestines so that food could go out another way.   Pt states she lives with 2 roommates and receives $15/month EBT benefits. Pt states they never go without food, when food is running low her roommates dad will cook for them. Pt states she drinks a lot of water at night. Pt states is having a hard time with finances. 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    Dietary assessment: A typical day consists of 3 meals and 0-1 snacks  Safe foods include: likes a  variety of foods Avoided foods include:  24 hour recall:  B: eggs + toast  S:  L: [place] + stuffing + potatoes + corn on cob  S:  D: pasta (noodles, meat, sauce) + bread Beverages: water, soda, 2 c coffee  Physical activity: ADL's  Estimated energy intake: ~1200 kcal  Estimated energy needs: 2000-2400 kcal 250-300 g CHO 100-120 g pro 67-80 g fat  Nutrition Diagnosis: NB-3.2 Limited access to food or water As related to  lack of financial resources to purchase a sufficient quanitity of foods.  As evidenced by pt report of no income and limited EBT benefits.  Intervention/Goals: Nutrition education and counseling. Pt was educated on local patient food resources and ways to increase intake. Pt was in agreement with goals listed.  Goals: - Have carnation instant breakfast between meals + 2 TBS peanut butter.  - Add fruit with breakfast.  - [employer] Fri, Dec. 27 at 1 pm in employee parking lot across for [facility].  - Check multivitamin for iron.  Monitoring and Evaluation: Patient will follow up in 4 weeks.

## 2018-07-31 ENCOUNTER — Other Ambulatory Visit: Payer: Self-pay | Admitting: Internal Medicine

## 2018-08-01 ENCOUNTER — Other Ambulatory Visit: Payer: Self-pay | Admitting: Internal Medicine

## 2018-08-02 ENCOUNTER — Other Ambulatory Visit: Payer: Self-pay | Admitting: Internal Medicine

## 2018-08-03 ENCOUNTER — Other Ambulatory Visit: Payer: Self-pay | Admitting: Internal Medicine

## 2018-08-04 ENCOUNTER — Other Ambulatory Visit: Payer: Self-pay | Admitting: Family

## 2018-08-12 ENCOUNTER — Ambulatory Visit: Payer: Self-pay | Admitting: Registered"

## 2018-08-13 ENCOUNTER — Ambulatory Visit (INDEPENDENT_AMBULATORY_CARE_PROVIDER_SITE_OTHER): Payer: Medicare Other | Admitting: Psychiatry

## 2018-08-13 ENCOUNTER — Encounter (HOSPITAL_COMMUNITY): Payer: Self-pay | Admitting: Psychiatry

## 2018-08-13 VITALS — BP 110/76 | HR 104 | Resp 12 | Ht 63.0 in | Wt 104.2 lb

## 2018-08-13 DIAGNOSIS — F411 Generalized anxiety disorder: Secondary | ICD-10-CM

## 2018-08-13 DIAGNOSIS — F3341 Major depressive disorder, recurrent, in partial remission: Secondary | ICD-10-CM | POA: Diagnosis not present

## 2018-08-13 DIAGNOSIS — F639 Impulse disorder, unspecified: Secondary | ICD-10-CM

## 2018-08-13 DIAGNOSIS — F5000 Anorexia nervosa, unspecified: Secondary | ICD-10-CM | POA: Diagnosis not present

## 2018-08-13 MED ORDER — LAMOTRIGINE 150 MG PO TABS: 300.0000 mg | ORAL_TABLET | Freq: Every day | ORAL | 2 refills | Status: DC

## 2018-08-13 NOTE — Progress Notes (Signed)
BH MD/PA/NP OP Progress Note  08/13/2018 3:20 PM Karen Hahn  MRN:  [id number]  Chief Complaint:  Chief Complaint    Anxiety; Depression; Follow-up     HPI: Patient tells me that her depression is under control.  She is denying isolation, anhedonia and hopelessness.  She denies SI/HI.  We will assessment along while talking about her mother.  In the past contact with her mother has caused patient to decompensate.  Currently she is experiencing increased anxiety due to the stress of phone contact with her mother.  She has an obvious increase in her skin picking.  Patient has been talking with her mother more as she feels that her mother has no one and is currently homeless.  Karen Hahn would feel very guilty if something happened to her mother and Karen Hahn had cut off contact.  She is denying any manic-like symptoms.  Karen Hahn is eating 3 meals a day and reports that she is not looking at her portion size or attempting to control her weight in any way.   Visit Diagnosis:    ICD-10-CM   1. GAD (generalized anxiety disorder) F41.1   2. Impulse control disorder F63.9 lamoTRIgine (LAMICTAL) 150 MG tablet  3. Recurrent major depressive disorder, in partial remission (HCC) F33.41   4. Anorexia nervosa F50.00       Past Psychiatric History:  Anxiety:Yes Bipolar Disorder:No Depression:Yes Mania:No Psychosis:No Schizophrenia:No Personality Disorder:No Hospitalization for psychiatric illness:Yesnumerous- 2 intensive eating program treatments in 2013 and 2014. BH eating d/o with Dr. Orland Dec Nov 2011. Multiple inpt hospitalizations at [facility] (2011, 2011, 2005), [place] (3x), [facility], [place], [place] (06/2013).  History of Electroconvulsive Shock Therapy:No Prior Suicide Attempts:No Previous meds: Risperdal, Zyprexa, Seroquel, Paxil, Prozac, Remeron, Cymbalta    Past Medical History:  Past Medical History:  Diagnosis Date  . ADD (attention deficit disorder)   . Anorexia  nervosa without bulimia   . Chronic constipation   . Drug abuse (HCC)   . GAD (generalized anxiety disorder)   . GERD (gastroesophageal reflux disease)   . History of gastric restrictive surgery   . History of gastric ulcer   . History of Helicobacter pylori infection    after 2003  . HLD (hyperlipidemia) 02/16/2018  . Impulse control disorder   . Moderate major depression (HCC)   . Osteopenia   . Post concussion syndrome    fell 10/20/2015  . SMAS (superior mesenteric artery syndrome) (HCC)   . Urge urinary incontinence     Past Surgical History:  Procedure Laterality Date  . DILATION AND CURETTAGE OF UTERUS  2005  . INTERSTIM IMPLANT PLACEMENT N/A 10/18/2015   Procedure: [name] IMPLANT FIRST STAGE;  Surgeon: Jerilee Field, MD;  Location: [facility];  Service: Urology;  Laterality: N/A;  . INTERSTIM IMPLANT PLACEMENT N/A 10/18/2015   Procedure: [name] IMPLANT SECOND STAGE;  Surgeon: Jerilee Field, MD;  Location: [facility];  Service: Urology;  Laterality: N/A;  . LAPAROSCOPIC GASTRIC RESTRICTIVE DUODENAL PROCEDURE (DUODENAL SWITCH)  12/ 2003  . ORIF RIGHT WRIST FX  2013   REMOVAL HARDWARE X2 in  2013--  No retained hardware    Family Psychiatric History:  Family History  Problem Relation Age of Onset  . Alcohol abuse Mother   . Anxiety disorder Mother   . Depression Mother        severe with hx of suicide attempt. treated with ECT  . COPD Mother   . Cancer Mother  Cervical  . Anxiety disorder Father   . Drug abuse Father   . COPD Father   . Alcohol abuse Maternal Uncle   . Anxiety disorder Maternal Grandmother   . Depression Maternal Grandmother   . ADD / ADHD Brother   . Breast cancer Paternal Grandmother [id number]  . Stomach cancer Neg Hx   . Colon cancer Neg Hx   . Rectal cancer Neg Hx   . Esophageal cancer Neg Hx     Social History:  Social History   Socioeconomic History  . Marital status: Divorced    Spouse name:  Not on file  . Number of children: 0  . Years of education: 1014  . Highest education level: Not on file  Occupational History  . Occupation: Disability  Social Needs  . Financial resource strain: Not on file  . Food insecurity:    Worry: Sometimes true    Inability: Never true  . Transportation needs:    Medical: Yes    Non-medical: Yes  Tobacco Use  . Smoking status: Former Smoker    Packs/day: 0.50    Years: 1.00    Pack years: 0.50    Types: Cigarettes    Last attempt to quit: 12/03/2016    Years since quitting: 1.6  . Smokeless tobacco: Never Used  Substance and Sexual Activity  . Alcohol use: No    Alcohol/week: 0.0 standard drinks  . Drug use: No    Frequency: 7.0 times per week    Types: Marijuana, "Crack" cocaine, Cocaine    Comment: hx of and last used cocaine 2012. she was smoking marjiuana 7g/week. smoking thru out the day but quit 4 months ago  . Sexual activity: Not Currently    Comment: 1st intercourse 43 yo-Fewer than 5 partners  Lifestyle  . Physical activity:    Days per week: 0 days    Minutes per session: 0 min  . Stress: Not on file  Relationships  . Social connections:    Talks on phone: Not on file    Gets together: Not on file    Attends religious service: Not on file    Active member of club or organization: Not on file    Attends meetings of clubs or organizations: Not on file    Relationship status: Not on file  Other Topics Concern  . Not on file  Social History Narrative   Fun: Play piano; Build Lego   Denies abuse and feels safe at home. Living [place] friend in [place].     Allergies:  Allergies  Allergen Reactions  . Bactrim [Sulfamethoxazole-Trimethoprim] Other (See Comments)    Yeast infection  . Penicillins Other (See Comments)    Has patient had a PCN reaction causing immediate rash, facial/tongue/throat swelling, SOB or lightheadedness with hypotension: NO (UTI, YEAST INFECTION) Has patient had a PCN reaction causing severe  rash involving mucus membranes or skin necrosis: NO Has patient had a PCN reaction that required hospitalization NO Has patient had a PCN reaction occurring within the last 10 years: NO If all of the above answers are "NO", then may proceed with Cephalosporin use.   . Ciprofloxacin Itching  . Morphine And Related Itching and Other (See Comments)    "whole body feels tight feeling"  . Reglan [Metoclopramide]   . Benadryl [Diphenhydramine Hcl] Palpitations  . Diflucan [Fluconazole] Rash    Metabolic Disorder Labs: No results found for: HGBA1C, MPG No results found for: PROLACTIN Lab Results  Component Value Date  CHOL 197 02/16/2018   TRIG 67.0 02/16/2018   HDL 58.90 02/16/2018   CHOLHDL 3 02/16/2018   VLDL 13.4 02/16/2018   LDLCALC 125 (H) 02/16/2018   LDLCALC 139 (H) 05/21/2017   Lab Results  Component Value Date   TSH 2.92 05/08/2018   TSH 3.11 02/16/2018    Therapeutic Level Labs: No results found for: LITHIUM No results found for: VALPROATE No components found for:  CBMZ  Current Medications: Current Outpatient Medications  Medication Sig Dispense Refill  . Biotin 1 MG CAPS Take by mouth daily.    . Calcium-Iron-Vit D-Vit K (CALCIUM SOFT CHEWS) [id number] MG-UNT-MCG CHEW Chew by mouth daily.    Marland Kitchen. dicyclomine (BENTYL) 10 MG capsule TAKE 1 CAPSULE BY MOUTH FOUR TIMES DAILY BEFORE MEALS AND AT BEDTIME 120 capsule 0  . ferrous gluconate (FERGON) 240 (27 FE) MG tablet Take 240 mg by mouth daily after supper.    Marland Kitchen. ibuprofen (ADVIL,MOTRIN) 600 MG tablet Take 1 tablet (600 mg total) by mouth every 8 (eight) hours as needed. 90 tablet 2  . lamoTRIgine (LAMICTAL) 150 MG tablet Take 2 tablets (300 mg total) by mouth daily. 60 tablet 2  . Multiple Vitamins-Minerals (MULTIVITAMIN GUMMIES ADULT) CHEW Chew by mouth.    . pantoprazole (PROTONIX) 40 MG tablet TAKE 1 TABLET BY MOUTH EVERY MORNING FOR 90 DAYS 90 tablet 1  . promethazine (PHENERGAN) 25 MG tablet TAKE 1 TABLET(25  MG) BY MOUTH EVERY 6 HOURS AS NEEDED FOR NAUSEA OR VOMITING 30 tablet 2  . rosuvastatin (CRESTOR) 10 MG tablet TAKE 1 TABLET(10 MG) BY MOUTH DAILY 90 tablet 1   No current facility-administered medications for this visit.      Musculoskeletal: Strength & Muscle Tone: within normal limits Gait & Station: normal Patient leans: N/A  Psychiatric Specialty Exam: Review of Systems  Constitutional: Negative for chills, diaphoresis and fever.  Skin: Negative for itching and rash.  Psychiatric/Behavioral: Negative for depression and suicidal ideas. The patient is nervous/anxious. The patient does not have insomnia.     Blood pressure 110/76, pulse (!) 104, resp. rate 12, height 5\' 3"  (1.6 m), weight 104 lb 3.2 oz (47.3 kg).Body mass index is 18.46 kg/m.  General Appearance: Casual and obvious increase in skin picking  Eye Contact:  Good  Speech:  Clear and Coherent and Normal Rate  Volume:  Normal  Mood:  Anxious  Affect:  Full Range  Thought Process:  Coherent and Descriptions of Associations: Circumstantial  Orientation:  Full (Time, Place, and Person)  Thought Content:  Logical  Suicidal Thoughts:  No  Homicidal Thoughts:  No  Memory:  Immediate;   Good  Judgement:  Fair  Insight:  Present  Psychomotor Activity:  Normal  Concentration:  Concentration: Good  Recall:  Good  Fund of Knowledge:  Good  Language:  Good  Akathisia:  No  Handed:  Right  AIMS (if indicated):     Assets:  Communication Skills Desire for Improvement Housing Resilience Social Support Transportation Vocational/Educational  ADL's:  Intact  Cognition:  WNL  Sleep:   good     Screenings: PHQ2-9     Nutrition from 06/16/2018 in [facility] Office Visit from 02/16/2018 in [facility] Office Visit from 11/14/2016 in [facility]  PHQ-2 Total Score  0  0  0      I reviewed the information below 08/13/2018 and have updated  it Assessment and Plan: MDD-recurrent, severe without psychotic features  versus bipolar 2 disorder Anorexia nervosa Impulse control disorder GAD ADHD-inattentive type Insomnia Cannabis use disorder    Medication management with supportive therapy. Risks and benefits, side effects and alternative treatment options discussed with patient. Pt was given an opportunity to ask questions about medication, illness, and treatment. All current psychiatric medications have been reviewed and discussed with the patient and adjusted as clinically appropriate. The patient has been provided an accurate and updated list of the medications being now prescribed. Patient expressed understanding of how their medications were to be used.  Pt verbalized understanding and verbal consent obtained for treatment.  The risk of un-intended pregnancy is low based on the fact that pt reports she is not using any form of contraceptives but is not sexually active at this time. Pt is aware that these meds carry a teratogenic risk. Pt will discuss plan of action if she does or plans to become pregnant in the future.  Status of current problems: stable  Meds: Lamictal 300 mg p.o. daily for MDD versus bipolar disorder, impulse control disorder- pt requesting 90 day supply Patient is managing symptoms of anorexia, GAD, ADHD, insomnia on her own   Labs: none  Therapy: brief supportive therapy provided. Discussed psychosocial stressors in detail.   We talked about self-care and I encouraged the patient to start journaling. Consultations: Encouraged to follow up with PCP as needed  Pt denies SI and is at an acute low risk for suicide. Patient told to call clinic if any problems occur. Patient advised to go to ER if they should develop SI/HI, side effects, or if symptoms worsen. Has crisis numbers to call if needed. Pt verbalized understanding.  F/up in 2 months or sooner if needed    Oletta Darter, MD 08/13/2018,  3:20 PM

## 2018-08-14 ENCOUNTER — Other Ambulatory Visit (HOSPITAL_COMMUNITY): Payer: Self-pay | Admitting: Psychiatry

## 2018-08-14 DIAGNOSIS — F639 Impulse disorder, unspecified: Secondary | ICD-10-CM

## 2018-08-18 ENCOUNTER — Encounter: Payer: Self-pay | Admitting: Registered"

## 2018-08-18 ENCOUNTER — Encounter: Payer: Medicare Other | Attending: Internal Medicine | Admitting: Registered"

## 2018-08-18 DIAGNOSIS — R634 Abnormal weight loss: Secondary | ICD-10-CM | POA: Insufficient documentation

## 2018-08-18 DIAGNOSIS — Z713 Dietary counseling and surveillance: Secondary | ICD-10-CM | POA: Insufficient documentation

## 2018-08-18 NOTE — Patient Instructions (Addendum)
-   Keep up the great work with adding between meals.   - Aim to a glass of water at least 3 times a day and then supplement as needed.   - Use handout for added calories.

## 2018-08-18 NOTE — Progress Notes (Signed)
Appointment start time: 4:05  Appointment end time: 4:35  Patient was seen on 08/18/2018 for nutrition counseling pertaining to disordered eating  Primary care provider: Ria Clock  Assessment  Weight history:  Highest weight: 115 (6 years ago)    Lowest weight: 80 Most consistent weight: 105   How has weight changed in the past year: recently has dropped  Medical Information:  Changes in hair, skin, nails since ED started: no Chewing/swallowing difficulties: no Relux or heartburn: taking protonix  Mental health diagnosis: previous history of anorexia nervosa  Pt reports most recent weight on yesterday was 104.5 lbs. Pt states states she has been taking iron supplements in addition to multivitamins. Pt states she has had increased stress with mom. Mom lives in [place] and does not have stable housing. Pt has consistently increased caloric intake throughout the day with 3 meals and 1 snack. Pt states she knows she needs to drink more during the day. Pt states she drinks about 12 oz of water or soda with meals.   Pt states she had anorexia nervosa age 43 - early 87's. Pt states she does not feel that her previous eating disorder is problematic at this point. Pt states she has bipolar. Pt states she sees a  psychiatrist every 3 months. Pt states she used to see a therapist but has not seen them in a while. Pt states she had a surgery in 2003 on her intestines so that food could go out another way.   Pt states she lives with 2 roommates and receives $15/month EBT benefits. Pt states they never go without food, when food is running low her roommates dad will cook for them. Pt states she drinks a lot of water at night. Pt states is having a hard time with finances. 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    Dietary assessment: A typical day consists of 3 meals and 1 snack  Safe foods include: likes a variety of  foods Avoided foods include:  24 hour recall:  B: eggs + toast + apple and peanut butter (sometimes)  S:  L: chicken + vegetables + baked potato or [place] + stuffing + potatoes + corn on cob  S: sometimes carnation instant breakfast w/ 2 TBS  D: sloppy joe +macaroni and cheese + green beans S: sometimes carnation instant breakfast w/ 2 TBS  Beverages: water (~24 oz), soda, 2 c coffee  Physical activity: ADL's  Estimated energy intake: ~1600-1900 kcals  Estimated energy needs: 2000-2400 kcal 250-300 g CHO 100-120 g pro 67-80 g fat  Nutrition Diagnosis: NB-3.2 Limited access to food or water As related to  lack of financial resources to purchase a sufficient quanitity of foods.  As evidenced by pt report of no income and limited EBT benefits.  Intervention/Goals: Nutrition education and counseling. Pt was encouraged to continue with great work on increasing caloric intake. Pt was educated and counseled on ways to increase calories by adding to food already prepared and ways to increase water intake. Pt was in agreement with goals listed.  Goals: - Keep up the great work with adding between meals.  - Aim to a glass of water at least 3 times a day and then supplement as needed.  - Use handout for added calories.  Monitoring and Evaluation: Patient will follow up in 4 weeks.

## 2018-09-17 ENCOUNTER — Ambulatory Visit: Payer: Self-pay | Admitting: Registered"

## 2018-09-21 ENCOUNTER — Encounter: Payer: Self-pay | Admitting: Gynecology

## 2018-09-21 ENCOUNTER — Ambulatory Visit (INDEPENDENT_AMBULATORY_CARE_PROVIDER_SITE_OTHER): Payer: Medicare Other | Admitting: Gynecology

## 2018-09-21 VITALS — BP 116/74 | Ht 62.5 in | Wt 103.0 lb

## 2018-09-21 DIAGNOSIS — Z01419 Encounter for gynecological examination (general) (routine) without abnormal findings: Secondary | ICD-10-CM

## 2018-09-21 DIAGNOSIS — R300 Dysuria: Secondary | ICD-10-CM | POA: Diagnosis not present

## 2018-09-21 MED ORDER — SULFAMETHOXAZOLE-TRIMETHOPRIM 800-160 MG PO TABS: 1.0000 | ORAL_TABLET | Freq: Two times a day (BID) | ORAL | 0 refills | Status: DC

## 2018-09-21 NOTE — Progress Notes (Signed)
    Karen Hahn 13-Feb-1976 [id number]        43 y.o.  [id number] for annual gynecologic exam.  Notes some twinging with urination.  Thinks she is getting a UTI.  Has several UTIs each year.  No frequency urgency low back pain fever or chills.  Past medical history,surgical history, problem list, medications, allergies, family history and social history were all reviewed and documented as reviewed in the EPIC chart.  ROS:  Performed with pertinent positives and negatives included in the history, assessment and plan.   Additional significant findings : None   Exam: Kennon Portela assistant Vitals:   09/21/18 1558  BP: 116/74  Weight: 103 lb (46.7 kg)  Height: 5' 2.5" (1.588 m)   Body mass index is 18.54 kg/m.  General appearance:  Normal affect, orientation and appearance. Skin: Grossly normal HEENT: Without gross lesions.  No cervical or supraclavicular adenopathy. Thyroid normal.  Lungs:  Clear without wheezing, rales or rhonchi Cardiac: RR, without RMG Abdominal:  Soft, nontender, without masses, guarding, rebound, organomegaly or hernia Breasts:  Examined lying and sitting without masses, retractions, discharge or axillary adenopathy. Pelvic:  Ext, BUS, Vagina: Normal  Cervix: Normal  Uterus: Anteverted, normal size, shape and contour, midline and mobile nontender   Adnexa: Without masses or tenderness    Anus and perineum: Normal   Rectovaginal: Normal sphincter tone without palpated masses or tenderness.    Assessment/Plan:  43 y.o. G59P0020 female for annual gynecologic exam.  With regular menses, abstinent contraception  1. Mild dysuria.  Urine analysis appears contaminated.  Will culture for completeness.  I am going to cover her based on her history of recurrent UTIs with Septra DS 1 p.o. twice daily x3 days.  She will follow-up if her symptoms persist, worsen or recur. 2. Small probable fibroadenoma in right breast measuring 6 mm 4 cm at 10 o'clock position noted on  mammography 2018.  Radiology recommended a six-month follow-up.  Patient needs to make this appointment and I encouraged her to do so.  Breast exam normal today. 3. Pap smear/HPV 2019.  No Pap smear done today.  History of HPV reported years ago with negative Pap smears since.  Plan repeat Pap smear/HPV at 5-year interval per current screening guidelines. 4. Health maintenance.  Patient reports routine blood work done elsewhere.  Follow-up 1 year, sooner as needed.    [name], 4:14 PM 09/21/2018

## 2018-09-21 NOTE — Patient Instructions (Signed)
Schedule your mammogram.  Follow-up if your urinary tract symptoms persist despite antibiotics.  Follow-up in 1 year for annual exam

## 2018-09-23 LAB — URINALYSIS, COMPLETE W/RFL CULTURE
Bilirubin Urine: NEGATIVE
Glucose, UA: NEGATIVE
Hgb urine dipstick: NEGATIVE
Hyaline Cast: NONE SEEN /LPF
Ketones, ur: NEGATIVE
Leukocyte Esterase: NEGATIVE
Nitrites, Initial: NEGATIVE
Protein, ur: NEGATIVE
RBC / HPF: NONE SEEN /HPF (ref 0–2)
Specific Gravity, Urine: 1.02 (ref 1.001–1.03)
pH: 6 (ref 5.0–8.0)

## 2018-09-23 LAB — URINE CULTURE
MICRO NUMBER:: 208758
Result:: NO GROWTH
SPECIMEN QUALITY:: ADEQUATE

## 2018-09-23 LAB — CULTURE INDICATED

## 2018-10-06 ENCOUNTER — Ambulatory Visit: Payer: Self-pay | Admitting: Registered"

## 2018-10-06 ENCOUNTER — Other Ambulatory Visit: Payer: Self-pay | Admitting: Internal Medicine

## 2018-10-06 DIAGNOSIS — N6310 Unspecified lump in the right breast, unspecified quadrant: Secondary | ICD-10-CM

## 2018-10-15 ENCOUNTER — Encounter (HOSPITAL_COMMUNITY): Payer: Self-pay | Admitting: Psychiatry

## 2018-10-15 ENCOUNTER — Other Ambulatory Visit: Payer: Self-pay

## 2018-10-15 ENCOUNTER — Ambulatory Visit (INDEPENDENT_AMBULATORY_CARE_PROVIDER_SITE_OTHER): Payer: Medicare Other | Admitting: Psychiatry

## 2018-10-15 VITALS — BP 110/60 | HR 101 | Resp 16 | Wt 105.4 lb

## 2018-10-15 DIAGNOSIS — F5000 Anorexia nervosa, unspecified: Secondary | ICD-10-CM

## 2018-10-15 DIAGNOSIS — F339 Major depressive disorder, recurrent, unspecified: Secondary | ICD-10-CM | POA: Diagnosis not present

## 2018-10-15 DIAGNOSIS — F411 Generalized anxiety disorder: Secondary | ICD-10-CM

## 2018-10-15 DIAGNOSIS — F639 Impulse disorder, unspecified: Secondary | ICD-10-CM

## 2018-10-15 MED ORDER — LAMOTRIGINE 150 MG PO TABS: ORAL_TABLET | ORAL | 0 refills | Status: DC

## 2018-10-15 NOTE — Progress Notes (Signed)
BH MD/PA/NP OP Progress Note  10/15/2018 3:37 PM Karen Hahn  MRN:  [id number]  Chief Complaint:  Chief Complaint    Follow-up; Medication Refill     HPI: Karen KaufmannMelissa tells me that she is doing well.  Her depression is well controlled.  She is getting along with her roommates.  She denies anhedonia and hopelessness.  Her sleep is fair.  She denies SI/HI.  Her mother is now in a medical rehab facility and will be moving into assisted living.  As a result her mother has been calling her last and when they do talk the phone call content is less stressful.  Her anxiety is mild and overall controlled.  She does report that for 4 to 5 days surrounding the onset of her.  She is very irritable.  Karen Hahn denies any manic or hypomanic-like symptoms.  She states that she is eating 3 meals a day and is not focused on her weight.  On review her weight has been stable over the last 3 months.  Visit Diagnosis:    ICD-10-CM   1. Episode of recurrent major depressive disorder, unspecified depression episode severity (HCC) F33.9   2. Impulse control disorder F63.9 lamoTRIgine (LAMICTAL) 150 MG tablet  3. Anorexia nervosa F50.00   4. GAD (generalized anxiety disorder) F41.1       Past Psychiatric History:  Anxiety:Yes Bipolar Disorder:No Depression:Yes Mania:No Psychosis:No Schizophrenia:No Personality Disorder:No Hospitalization for psychiatric illness:Yesnumerous- 2 intensive eating program treatments in 2013 and 2014. BH eating d/o with Dr. Orland DecQuails Nov 2011. [facility] [place], [facility] (06/2013).  History of Electroconvulsive Shock Therapy:No Prior Suicide Attempts:No Previous meds: Risperdal, Zyprexa, Seroquel, Paxil, Prozac, Remeron, Cymbalta    Past Medical History:  Past Medical History:  Diagnosis Date  . ADD (attention deficit disorder)   . Anorexia nervosa without bulimia   . Chronic constipation    . Drug abuse (HCC)   . GAD (generalized anxiety disorder)   . GERD (gastroesophageal reflux disease)   . History of gastric restrictive surgery   . History of gastric ulcer   . History of Helicobacter pylori infection    after 2003  . HLD (hyperlipidemia) 02/16/2018  . Impulse control disorder   . Moderate major depression (HCC)   . Osteopenia   . Post concussion syndrome    fell 10/20/2015  . SMAS (superior mesenteric artery syndrome) (HCC)   . Urge urinary incontinence     Past Surgical History:  Procedure Laterality Date  . DILATION AND CURETTAGE OF UTERUS  2005  . INTERSTIM IMPLANT PLACEMENT N/A 10/18/2015   Procedure: Leane PlattINTERSTIM IMPLANT FIRST STAGE;  Surgeon: Jerilee FieldMatthew Eskridge, MD;  [facility];  Service: Urology;  Laterality: N/A;  . INTERSTIM IMPLANT PLACEMENT N/A 10/18/2015   Procedure: Leane PlattINTERSTIM IMPLANT SECOND STAGE;  Surgeon: Jerilee FieldMatthew Eskridge, MD;  [facility] ;  Service: Urology;  Laterality: N/A;  . LAPAROSCOPIC GASTRIC RESTRICTIVE DUODENAL PROCEDURE (DUODENAL SWITCH)  12/ 2003  . ORIF RIGHT WRIST FX  2013   REMOVAL HARDWARE X2 in  2013--  No retained hardware    Family Psychiatric History:  Family History  Problem Relation Age of Onset  . Alcohol abuse Mother   . Anxiety disorder Mother   . Depression Mother        severe with hx of suicide attempt. treated with ECT  . COPD Mother   . Cancer Mother  Cervical  . Anxiety disorder Father   . Drug abuse Father   . COPD Father   . Alcohol abuse Maternal Uncle   . Anxiety disorder Maternal Grandmother   . Depression Maternal Grandmother   . ADD / ADHD Brother   . Breast cancer Paternal Grandmother 9  . Stomach cancer Neg Hx   . Colon cancer Neg Hx   . Rectal cancer Neg Hx   . Esophageal cancer Neg Hx     Social History:  Social History   Socioeconomic History  . Marital status: Divorced    Spouse name: Not on file  . Number of children: 0  . Years of  education: 8  . Highest education level: Not on file  Occupational History  . Occupation: Disability  Social Needs  . Financial resource strain: Not on file  . Food insecurity:    Worry: Sometimes true    Inability: Never true  . Transportation needs:    Medical: Yes    Non-medical: Yes  Tobacco Use  . Smoking status: Former Smoker    Packs/day: 0.50    Years: 1.00    Pack years: 0.50    Types: Cigarettes    Last attempt to quit: 12/03/2016    Years since quitting: 1.8  . Smokeless tobacco: Never Used  . Tobacco comment: quit smoking 8 months ago as of 10/15/18  Substance and Sexual Activity  . Alcohol use: No    Alcohol/week: 0.0 standard drinks  . Drug use: No    Frequency: 7.0 times per week    Types: Marijuana, "Crack" cocaine, Cocaine    Comment: hx of and last used cocaine 2012. she was smoking marjiuana 7g/week. smoking thru out the day but quit 4 months ago  . Sexual activity: Not Currently    Comment: 1st intercourse 43 yo-Fewer than 5 partners  Lifestyle  . Physical activity:    Days per week: 0 days    Minutes per session: 0 min  . Stress: Not on file  Relationships  . Social connections:    Talks on phone: Not on file    Gets together: Not on file    Attends religious service: Not on file    Active member of club or organization: Not on file    Attends meetings of clubs or organizations: Not on file    Relationship status: Not on file  Other Topics Concern  . Not on file  Social History Narrative   Fun: Play piano; Build Lego   Denies abuse and feels safe at home. Living with friend in [place].     Allergies:  Allergies  Allergen Reactions  . Bactrim [Sulfamethoxazole-Trimethoprim] Other (See Comments)    Yeast infection  . Penicillins Other (See Comments)    Has patient had a PCN reaction causing immediate rash, facial/tongue/throat swelling, SOB or lightheadedness with hypotension: NO (UTI, YEAST INFECTION) Has patient had a PCN reaction  causing severe rash involving mucus membranes or skin necrosis: NO Has patient had a PCN reaction that required hospitalization NO Has patient had a PCN reaction occurring within the last 10 years: NO If all of the above answers are "NO", then may proceed with Cephalosporin use.   . Ciprofloxacin Itching  . Morphine And Related Itching and Other (See Comments)    "whole body feels tight feeling"  . Reglan [Metoclopramide]   . Benadryl [Diphenhydramine Hcl] Palpitations  . Diflucan [Fluconazole] Rash    Metabolic Disorder Labs: No results found for: HGBA1C,  MPG No results found for: PROLACTIN Lab Results  Component Value Date   CHOL 197 02/16/2018   TRIG 67.0 02/16/2018   HDL 58.90 02/16/2018   CHOLHDL 3 02/16/2018   VLDL 13.4 02/16/2018   LDLCALC 125 (H) 02/16/2018   LDLCALC 139 (H) 05/21/2017   Lab Results  Component Value Date   TSH 2.92 05/08/2018   TSH 3.11 02/16/2018    Therapeutic Level Labs: No results found for: LITHIUM No results found for: VALPROATE No components found for:  CBMZ  Current Medications: Current Outpatient Medications  Medication Sig Dispense Refill  . Biotin 1 MG CAPS Take by mouth daily.    . Calcium-Iron-Vit D-Vit K (CALCIUM SOFT CHEWS) 500-08-998-40 MG-UNT-MCG CHEW Chew by mouth daily.    Marland Kitchen dicyclomine (BENTYL) 10 MG capsule TAKE 1 CAPSULE BY MOUTH FOUR TIMES DAILY BEFORE MEALS AND AT BEDTIME 120 capsule 0  . ferrous gluconate (FERGON) 240 (27 FE) MG tablet Take 240 mg by mouth daily after supper.    Marland Kitchen ibuprofen (ADVIL,MOTRIN) 600 MG tablet Take 1 tablet (600 mg total) by mouth every 8 (eight) hours as needed. 90 tablet 2  . lamoTRIgine (LAMICTAL) 150 MG tablet TAKE 2 TABLETS(300 MG) BY MOUTH DAILY 180 tablet 0  . Multiple Vitamins-Minerals (MULTIVITAMIN GUMMIES ADULT) CHEW Chew by mouth.    . pantoprazole (PROTONIX) 40 MG tablet TAKE 1 TABLET BY MOUTH EVERY MORNING FOR 90 DAYS 90 tablet 1  . promethazine (PHENERGAN) 25 MG tablet TAKE 1  TABLET(25 MG) BY MOUTH EVERY 6 HOURS AS NEEDED FOR NAUSEA OR VOMITING 30 tablet 2  . rosuvastatin (CRESTOR) 10 MG tablet TAKE 1 TABLET(10 MG) BY MOUTH DAILY 90 tablet 1  . sulfamethoxazole-trimethoprim (BACTRIM DS,SEPTRA DS) 800-160 MG tablet Take 1 tablet by mouth 2 (two) times daily. (Patient not taking: Reported on 10/15/2018) 6 tablet 0   No current facility-administered medications for this visit.      Musculoskeletal: Strength & Muscle Tone: within normal limits Gait & Station: normal Patient leans: N/A  Psychiatric Specialty Exam: Review of Systems  Constitutional: Negative for chills, diaphoresis and fever.  Respiratory: Negative for cough, sputum production and shortness of breath.     Blood pressure 110/60, pulse (!) 101, resp. rate 16, weight 105 lb 6.4 oz (47.8 kg).Body mass index is 18.97 kg/m.  General Appearance: Casual  Eye Contact:  Good  Speech:  Clear and Coherent and Normal Rate  Volume:  Normal  Mood:  Euthymic  Affect:  Full Range  Thought Process:  Goal Directed and Descriptions of Associations: Intact  Orientation:  Full (Time, Place, and Person)  Thought Content:  Logical  Suicidal Thoughts:  No  Homicidal Thoughts:  No  Memory:  Immediate;   Good  Judgement:  Good  Insight:  Good  Psychomotor Activity:  Normal  Concentration:  Concentration: Good  Recall:  Good  Fund of Knowledge:  Good  Language:  Good  Akathisia:  No  Handed:  Right  AIMS (if indicated):     Assets:  Communication Skills Desire for Improvement Financial Resources/Insurance Housing Social Support Talents/Skills Transportation  ADL's:  Intact  Cognition:  WNL  Sleep:   fair       Screenings: PHQ2-9     Nut[facility]/18/2018 [facility] Nutrition from 06/16/2018 in [facility] Office Visit from 02/16/2018 in [facility] Office Visit from 11/14/2016 in [facility]  PHQ-2 Total Score  0  0  0  0      I reviewed the information below on 10/15/2018 and have updated it Assessment and Plan: MDD-recurrent, severe without psychotic features versus bipolar 2 disorder Anorexia nervosa Impulse control disorder GAD ADHD-inattentive type Insomnia Cannabis use disorder    Medication management with supportive therapy. Risks and benefits, side effects and alternative treatment options discussed with patient. Pt was given an opportunity to ask questions about medication, illness, and treatment. All current psychiatric medications have been reviewed and discussed with the patient and adjusted as clinically appropriate. The patient has been provided an accurate and updated list of the medications being now prescribed. Patient expressed understanding of how their medications were to be used.  Pt verbalized understanding and verbal consent obtained for treatment.  The risk of un-intended pregnancy is low based on the fact that pt reports she is not using any form of contraceptives but is not sexually active at this time. Pt is aware that these meds carry a teratogenic risk. Pt will discuss plan of action if she does or plans to become pregnant in the future.  Status of current problems: doing well  Meds: Lamictal 300 mg p.o. daily for MDD versus bipolar disorder, impulse control disorder- pt requesting 90 day supply Patient is managing symptoms of anorexia, GAD, ADHD, insomnia on her own   Labs: none  Therapy: brief supportive therapy provided. Discussed psychosocial stressors in detail.   We talked about self-care and I encouraged the patient to continue journaling. Consultations: Encouraged to follow up with PCP as needed  Pt denies SI and is at an acute low risk for suicide. Patient told to call clinic if any problems occur. Patient advised to go to ER if they should develop SI/HI, side effects, or if symptoms worsen. Has crisis numbers to  call if needed. Pt verbalized understanding.  F/up in 3 months or sooner if needed    Oletta DarterSalina Miriana Gaertner, MD 10/15/2018, 3:37 PM

## 2018-10-23 ENCOUNTER — Ambulatory Visit: Payer: Self-pay | Admitting: Internal Medicine

## 2018-10-23 NOTE — Telephone Encounter (Signed)
Reassured pt. She is low risk for corona virus. Instructed if symptoms worsen to call back. Reviewed home remedies for nausea. Reason for Disposition . COVID-19 testing, questions about who needs it  Answer Assessment - Initial Assessment Questions 1. CONFIRMED CASE: "Who is the person with the confirmed COVID-19 infection that you were exposed to?"     n/a 2. PLACE of CONTACT: "Where were you when you were exposed to COVID-19  (coronavirus disease 2019)?" (e.g., city, state, country)     n/a 3. TYPE of CONTACT: "How much contact was there?" (e.g., live in same house, work in same office, same school)     n/a 4. DATE of CONTACT: "When did you have contact with a coronavirus patient?" (e.g., days)     n/a 5. DURATION of CONTACT: "How long were you in contact with the COVID-19 (coronavirus disease) patient?" (e.g., a few seconds, passed by person, a few minutes, live with the patient)     n/a 6. SYMPTOMS: "Do you have any symptoms?" (e.g., fever, cough, breathing difficulty)     Fever and nausea 7. PREGNANCY OR POSTPARTUM: "Is there any chance you are pregnant?" "When was your last menstrual period?" "Did you deliver in the last 2 weeks?"     Yes 8. HIGH RISK: "Do you have any heart or lung problems? Do you have a weakened immune system?" (e.g., CHF, COPD, asthma, HIV positive, chemotherapy, renal failure, diabetes mellitus, sickle cell anemia)     No  Protocols used: CORONAVIRUS (COVID-19) EXPOSURE-A-AH

## 2018-11-03 ENCOUNTER — Other Ambulatory Visit: Payer: Self-pay | Admitting: Emergency Medicine

## 2018-11-03 ENCOUNTER — Other Ambulatory Visit: Payer: Self-pay | Admitting: Internal Medicine

## 2018-11-03 MED ORDER — DICYCLOMINE HCL 10 MG PO CAPS: ORAL_CAPSULE | ORAL | 1 refills | Status: DC

## 2018-11-03 NOTE — Telephone Encounter (Signed)
Copied from CRM [id number]. Topic: Quick Communication - Rx Refill/Question >> Nov 03, 2018  1:01 PM Amado Coe, RN wrote: Medication:    dicyclomine (BENTYL) 10 MG capsule  Has the patient contacted their pharmacy? Yes, but has not heard anything back from the pharmacy or the office (Agent: If no, request that the patient contact the pharmacy for the refill.) (Agent: If yes, when and what did the pharmacy advise?)  Preferred Pharmacy (with phone number or street name):  [id number] - [place], [place] - [address]  Agent: Please be advised that RX refills may take up to 3 business days. We ask that you follow-up with your pharmacy.

## 2018-11-03 NOTE — Telephone Encounter (Signed)
RX sent to [facility]. Pt notified.

## 2018-12-16 ENCOUNTER — Telehealth: Payer: Self-pay | Admitting: Internal Medicine

## 2018-12-16 MED ORDER — PROMETHAZINE HCL 25 MG PO TABS: ORAL_TABLET | ORAL | 2 refills | Status: DC

## 2018-12-16 NOTE — Addendum Note (Signed)
Addended by: Corwin Levins on: 12/16/2018 03:54 PM   Modules accepted: Orders

## 2018-12-16 NOTE — Telephone Encounter (Signed)
Copied from CRM [id number]. Topic: Quick Communication - See Telephone Encounter >> Dec 16, 2018  1:24 PM Trula Slade wrote: CRM for notification. See Telephone encounter for: 12/16/18. Patient would like to have her promethazine (PHENERGAN) 25 MG tablet medication refilled and have it sent to her preferred pharmacy [facility] on [address].

## 2018-12-16 NOTE — Telephone Encounter (Signed)
Ok, this is done 

## 2019-01-04 ENCOUNTER — Telehealth: Payer: Self-pay | Admitting: Internal Medicine

## 2019-01-04 MED ORDER — DICYCLOMINE HCL 10 MG PO CAPS: ORAL_CAPSULE | ORAL | 0 refills | Status: DC

## 2019-01-04 NOTE — Telephone Encounter (Signed)
Copied from CRM [id number]. Topic: Quick Communication - Rx Refill/Question >> Jan 04, 2019 11:15 AM Crist Infante wrote: Medication: dicyclomine (BENTYL) 10 MG capsule Pt states she called the pharmacy, but I do not see anything from them.  Pt requesting refill.  [id number] - [place], [place] - [address] AT [facility] [address] [facility] [address] [phone] (Phone) [phone] (Fax)

## 2019-01-04 NOTE — Telephone Encounter (Signed)
Refill sent. See meds. Annual physical due 02/2019.

## 2019-01-21 ENCOUNTER — Encounter (HOSPITAL_COMMUNITY): Payer: Self-pay | Admitting: Psychiatry

## 2019-01-21 ENCOUNTER — Other Ambulatory Visit: Payer: Self-pay

## 2019-01-21 ENCOUNTER — Ambulatory Visit (INDEPENDENT_AMBULATORY_CARE_PROVIDER_SITE_OTHER): Payer: Medicare Other | Admitting: Psychiatry

## 2019-01-21 DIAGNOSIS — F639 Impulse disorder, unspecified: Secondary | ICD-10-CM | POA: Diagnosis not present

## 2019-01-21 MED ORDER — LAMOTRIGINE 150 MG PO TABS: ORAL_TABLET | ORAL | 0 refills | Status: DC

## 2019-01-21 NOTE — Progress Notes (Signed)
Virtual Visit via Telephone Note  I connected with Karen Hahn on 01/21/19 at  1:30 PM EDT by telephone and verified that I am speaking with the correct person using two identifiers.  Location: Patient: home Provider: office   I discussed the limitations, risks, security and privacy concerns of performing an evaluation and management service by telephone and the availability of in person appointments. I also discussed with the patient that there may be a patient responsible charge related to this service. The patient expressed understanding and agreed to proceed.   History of Present Illness: "Doing ok". Pt is still having a lot of stress related to her mother. "Things are good". Her roommate is pregnant. They will need to move into a 3 bedroom and Karen Hahn is a little anxious about having to move into her own place in the next couple of years. Pt is worried about being separated from her roommates dog. Her anxiety is manageable. She is getting along with her roommates but sometimes gets annoyed with them. Her depression is ok. She feels it is manageable except around her period. She is irritable but it is not worse than usual. Pt denies manic and hypomanic symptoms. Sleep is good but she doesn't sleep until late in the night. Appetite is good and she has not been focused on her weight. Pt is eating well. She denies SI/HI.   Observations/Objective: I spoke with Karen Hahn on the phone.  Pt was calm, pleasant and cooperative.  Pt was engaged in the conversation and answered questions appropriately.  Speech was clear and coherent with normal rate, tone and volume.  Mood is mildly depressed and anxious, affect is congruent. Thought processes are coherent and intact.  Thought content is with ruminations.  Pt denies SI/HI.   Pt denies auditory and visual hallucinations and did not appear to be responding to internal stimuli.  Memory and concentration are good.  Fund of knowledge and use of  language are average.  Insight and judgment are fair.  I am unable to comment on psychomotor activity, general appearance, hygiene, or eye contact as I was unable to physically see the patient on the phone.   Assessment and Plan:  MDD- recurrent, severe w/o psychotic features vs Bipolar 2 d/o Anorexia nervosa Impulse control d/o GAD ADHD- inattentive type Insomnia Cannabis use d/o  Lamictal 300mg  po qD Pt is managing symptoms of anorexia, GAD, ADHD, insomnia on her own   Follow Up Instructions: In 2 months or sooner if needed   I discussed the assessment and treatment plan with the patient. The patient was provided an opportunity to ask questions and all were answered. The patient agreed with the plan and demonstrated an understanding of the instructions.   The patient was advised to call back or seek an in-person evaluation if the symptoms worsen or if the condition fails to improve as anticipated.  I provided 15 minutes of non-face-to-face time during this encounter.   Charlcie Cradle, MD

## 2019-01-25 ENCOUNTER — Other Ambulatory Visit: Payer: Self-pay

## 2019-01-25 ENCOUNTER — Other Ambulatory Visit: Payer: Self-pay | Admitting: Internal Medicine

## 2019-01-25 MED ORDER — PANTOPRAZOLE SODIUM 40 MG PO TBEC: DELAYED_RELEASE_TABLET | ORAL | 0 refills | Status: DC

## 2019-02-18 ENCOUNTER — Other Ambulatory Visit: Payer: Self-pay

## 2019-02-18 ENCOUNTER — Ambulatory Visit (INDEPENDENT_AMBULATORY_CARE_PROVIDER_SITE_OTHER): Payer: Medicare Other | Admitting: Internal Medicine

## 2019-02-18 ENCOUNTER — Encounter: Payer: Self-pay | Admitting: Internal Medicine

## 2019-02-18 ENCOUNTER — Other Ambulatory Visit (INDEPENDENT_AMBULATORY_CARE_PROVIDER_SITE_OTHER): Payer: Medicare Other

## 2019-02-18 VITALS — BP 104/68 | HR 100 | Temp 98.5°F | Ht 62.5 in | Wt 105.4 lb

## 2019-02-18 DIAGNOSIS — E538 Deficiency of other specified B group vitamins: Secondary | ICD-10-CM | POA: Diagnosis not present

## 2019-02-18 DIAGNOSIS — E611 Iron deficiency: Secondary | ICD-10-CM | POA: Diagnosis not present

## 2019-02-18 DIAGNOSIS — E785 Hyperlipidemia, unspecified: Secondary | ICD-10-CM

## 2019-02-18 DIAGNOSIS — R739 Hyperglycemia, unspecified: Secondary | ICD-10-CM | POA: Insufficient documentation

## 2019-02-18 DIAGNOSIS — E559 Vitamin D deficiency, unspecified: Secondary | ICD-10-CM | POA: Diagnosis not present

## 2019-02-18 DIAGNOSIS — Z0000 Encounter for general adult medical examination without abnormal findings: Secondary | ICD-10-CM

## 2019-02-18 LAB — CBC WITH DIFFERENTIAL/PLATELET
Basophils Absolute: 0.1 10*3/uL (ref 0.0–0.1)
Basophils Relative: 0.9 % (ref 0.0–3.0)
Eosinophils Absolute: 0.1 10*3/uL (ref 0.0–0.7)
Eosinophils Relative: 1.6 % (ref 0.0–5.0)
HCT: 37.9 % (ref 36.0–46.0)
Hemoglobin: 12.4 g/dL (ref 12.0–15.0)
Lymphocytes Relative: 33.4 % (ref 12.0–46.0)
Lymphs Abs: 2.3 10*3/uL (ref 0.7–4.0)
MCHC: 32.8 g/dL (ref 30.0–36.0)
MCV: 86.9 fl (ref 78.0–100.0)
Monocytes Absolute: 0.4 10*3/uL (ref 0.1–1.0)
Monocytes Relative: 5.2 % (ref 3.0–12.0)
Neutro Abs: 4 10*3/uL (ref 1.4–7.7)
Neutrophils Relative %: 58.9 % (ref 43.0–77.0)
Platelets: 267 10*3/uL (ref 150.0–400.0)
RBC: 4.36 Mil/uL (ref 3.87–5.11)
RDW: 12.7 % (ref 11.5–15.5)
WBC: 6.9 10*3/uL (ref 4.0–10.5)

## 2019-02-18 LAB — URINALYSIS, ROUTINE W REFLEX MICROSCOPIC
Bilirubin Urine: NEGATIVE
Hgb urine dipstick: NEGATIVE
Ketones, ur: NEGATIVE
Leukocytes,Ua: NEGATIVE
Nitrite: NEGATIVE
Specific Gravity, Urine: 1.02 (ref 1.000–1.030)
Total Protein, Urine: NEGATIVE
Urine Glucose: NEGATIVE
Urobilinogen, UA: 0.2 (ref 0.0–1.0)
pH: 7 (ref 5.0–8.0)

## 2019-02-18 LAB — TSH: TSH: 2.6 u[IU]/mL (ref 0.35–4.50)

## 2019-02-18 LAB — HEMOGLOBIN A1C: Hgb A1c MFr Bld: 5.1 % (ref 4.6–6.5)

## 2019-02-18 MED ORDER — PROMETHAZINE HCL 25 MG PO TABS: ORAL_TABLET | ORAL | 2 refills | Status: DC

## 2019-02-18 NOTE — Progress Notes (Signed)
Subjective:    Patient ID: Karen Hahn, female    DOB: 11-09-1975, 43 y.o.   MRN: [id number]  HPI  Here for wellness and f/u;  Overall doing ok;  Pt denies Chest pain, worsening SOB, DOE, wheezing, orthopnea, PND, worsening LE edema, palpitations, dizziness or syncope.  Pt denies neurological change such as new headache, facial or extremity weakness.  Pt denies polydipsia, polyuria, or low sugar symptoms. Pt states overall good compliance with treatment and medications, good tolerability, and has been trying to follow appropriate diet.  Pt denies worsening depressive symptoms, suicidal ideation or panic. No fever, night sweats, wt loss, loss of appetite, or other constitutional symptoms.  Pt states good ability with ADL's, has low fall risk, home safety reviewed and adequate, no other significant changes in hearing or vision, and only occasionally active with exercise.  No new complaints Past Medical History:  Diagnosis Date   ADD (attention deficit disorder)    Anorexia nervosa without bulimia    Chronic constipation    Drug abuse (HCC)    GAD (generalized anxiety disorder)    GERD (gastroesophageal reflux disease)    History of gastric restrictive surgery    History of gastric ulcer    History of Helicobacter pylori infection    after 2003   HLD (hyperlipidemia) 02/16/2018   Impulse control disorder    Moderate major depression (HCC)    Osteopenia    Post concussion syndrome    fell 10/20/2015   SMAS (superior mesenteric artery syndrome) (HCC)    Urge urinary incontinence    Past Surgical History:  Procedure Laterality Date   DILATION AND CURETTAGE OF UTERUS  2005   INTERSTIM IMPLANT PLACEMENT N/A 10/18/2015   Procedure: [name] IMPLANT FIRST STAGE;  Surgeon: Jerilee Field, MD;  Location: [facility];  Service: Urology;  Laterality: N/A;   INTERSTIM IMPLANT PLACEMENT N/A 10/18/2015   Procedure: [name] IMPLANT SECOND STAGE;  Surgeon:  Jerilee Field, MD;  Location: [facility];  Service: Urology;  Laterality: N/A;   LAPAROSCOPIC GASTRIC RESTRICTIVE DUODENAL PROCEDURE (DUODENAL SWITCH)  12/ 2003   ORIF RIGHT WRIST FX  2013   REMOVAL HARDWARE X2 in  2013--  No retained hardware    reports that she quit smoking about 2 years ago. Her smoking use included cigarettes. She has a 0.50 pack-year smoking history. She has never used smokeless tobacco. She reports that she does not drink alcohol or use drugs. family history includes ADD / ADHD in her brother; Alcohol abuse in her maternal uncle and mother; Anxiety disorder in her father, maternal grandmother, and mother; Breast cancer (age of onset: 73) in her paternal grandmother; COPD in her father and mother; Cancer in her mother; Depression in her maternal grandmother and mother; Drug abuse in her father. Allergies  Allergen Reactions   Bactrim [Sulfamethoxazole-Trimethoprim] Other (See Comments)    Yeast infection   Penicillins Other (See Comments)    Has patient had a PCN reaction causing immediate rash, facial/tongue/throat swelling, SOB or lightheadedness with hypotension: NO (UTI, YEAST INFECTION) Has patient had a PCN reaction causing severe rash involving mucus membranes or skin necrosis: NO Has patient had a PCN reaction that required hospitalization NO Has patient had a PCN reaction occurring within the last 10 years: NO If all of the above answers are "NO", then may proceed with Cephalosporin use.    Ciprofloxacin Itching   Morphine And Related Itching and Other (See Comments)    "whole body feels  tight feeling"   Reglan [Metoclopramide]    Benadryl [Diphenhydramine Hcl] Palpitations   Diflucan [Fluconazole] Rash   Current Outpatient Medications on File Prior to Visit  Medication Sig Dispense Refill   Biotin 1 MG CAPS Take by mouth daily.     Calcium-Iron-Vit D-Vit K (CALCIUM SOFT CHEWS) 500-08-998-40 MG-UNT-MCG CHEW Chew by mouth daily.      dicyclomine (BENTYL) 10 MG capsule Take 1 capsule by mouth four times daily before meals and at bedtime. Annual physical due July 2020. 120 capsule 0   ferrous gluconate (FERGON) 240 (27 FE) MG tablet Take 240 mg by mouth daily after supper.     ibuprofen (ADVIL,MOTRIN) 600 MG tablet Take 1 tablet (600 mg total) by mouth every 8 (eight) hours as needed. 90 tablet 2   lamoTRIgine (LAMICTAL) 150 MG tablet TAKE 2 TABLETS(300 MG) BY MOUTH DAILY 180 tablet 0   Multiple Vitamins-Minerals (MULTIVITAMIN GUMMIES ADULT) CHEW Chew by mouth.     pantoprazole (PROTONIX) 40 MG tablet TAKE 1 TABLET BY MOUTH EVERY MORNING 90 tablet 0   rosuvastatin (CRESTOR) 10 MG tablet TAKE 1 TABLET(10 MG) BY MOUTH DAILY 90 tablet 1   No current facility-administered medications on file prior to visit.    Review of Systems Constitutional: Negative for other unusual diaphoresis, sweats, appetite or weight changes HENT: Negative for other worsening hearing loss, ear pain, facial swelling, mouth sores or neck stiffness.   Eyes: Negative for other worsening pain, redness or other visual disturbance.  Respiratory: Negative for other stridor or swelling Cardiovascular: Negative for other palpitations or other chest pain  Gastrointestinal: Negative for worsening diarrhea or loose stools, blood in stool, distention or other pain Genitourinary: Negative for hematuria, flank pain or other change in urine volume.  Musculoskeletal: Negative for myalgias or other joint swelling.  Skin: Negative for other color change, or other wound or worsening drainage.  Neurological: Negative for other syncope or numbness. Hematological: Negative for other adenopathy or swelling Psychiatric/Behavioral: Negative for hallucinations, other worsening agitation, SI, self-injury, or new decreased concentration All other system neg per pt    Objective:   Physical Exam BP 104/68 (BP Location: Left Arm, Patient Position: Sitting, Cuff Size:  Normal)    Pulse 100    Temp 98.5 F (36.9 C) (Oral)    Ht 5' 2.5" (1.588 m)    Wt 105 lb 6.4 oz (47.8 kg)    SpO2 98%    BMI 18.97 kg/m  VS noted,  Constitutional: Pt is oriented to person, place, and time. Appears well-developed and well-nourished, in no significant distress and comfortable Head: Normocephalic and atraumatic  Eyes: Conjunctivae and EOM are normal. Pupils are equal, round, and reactive to light Right Ear: External ear normal without discharge Left Ear: External ear normal without discharge Nose: Nose without discharge or deformity Mouth/Throat: Oropharynx is without other ulcerations and moist  Neck: Normal range of motion. Neck supple. No JVD present. No tracheal deviation present or significant neck LA or mass Cardiovascular: Normal rate, regular rhythm, normal heart sounds and intact distal pulses.   Pulmonary/Chest: WOB normal and breath sounds without rales or wheezing  Abdominal: Soft. Bowel sounds are normal. NT. No HSM  Musculoskeletal: Normal range of motion. Exhibits no edema Lymphadenopathy: Has no other cervical adenopathy.  Neurological: Pt is alert and oriented to person, place, and time. Pt has normal reflexes. No cranial nerve deficit. Motor grossly intact, Gait intact Skin: Skin is warm and dry. No rash noted or new ulcerations Psychiatric:  Has normal mood and affect. Behavior is normal without agitation No other exam findings Lab Results  Component Value Date   WBC 4.5 05/08/2018   HGB 12.5 05/08/2018   HCT 36.5 05/08/2018   PLT 276.0 05/08/2018   GLUCOSE 115 (H) 05/08/2018   CHOL 197 02/16/2018   TRIG 67.0 02/16/2018   HDL 58.90 02/16/2018   LDLCALC 125 (H) 02/16/2018   ALT 12 05/08/2018   AST 17 05/08/2018   NA 140 05/08/2018   K 4.5 05/08/2018   CL 104 05/08/2018   CREATININE 0.85 05/08/2018   BUN 13 05/08/2018   CO2 30 05/08/2018   TSH 2.92 05/08/2018      Assessment & Plan:

## 2019-02-18 NOTE — Assessment & Plan Note (Signed)
Declines statin 

## 2019-02-18 NOTE — Assessment & Plan Note (Signed)
Also for f/u vit d level 

## 2019-02-18 NOTE — Assessment & Plan Note (Signed)

## 2019-02-18 NOTE — Patient Instructions (Signed)

## 2019-02-18 NOTE — Assessment & Plan Note (Signed)
stable overall by history and exam, recent data reviewed with pt, and pt to continue medical treatment as before,  to f/u any worsening symptoms or concerns  

## 2019-02-19 LAB — HEPATIC FUNCTION PANEL
ALT: 17 U/L (ref 0–35)
AST: 17 U/L (ref 0–37)
Albumin: 4.6 g/dL (ref 3.5–5.2)
Alkaline Phosphatase: 73 U/L (ref 39–117)
Bilirubin, Direct: 0.1 mg/dL (ref 0.0–0.3)
Total Bilirubin: 0.4 mg/dL (ref 0.2–1.2)
Total Protein: 7.3 g/dL (ref 6.0–8.3)

## 2019-02-19 LAB — LIPID PANEL
Cholesterol: 208 mg/dL — ABNORMAL HIGH (ref 0–200)
HDL: 64.9 mg/dL (ref >39.00)
LDL Cholesterol: 131 mg/dL — ABNORMAL HIGH (ref 0–99)
NonHDL: 142.68
Total CHOL/HDL Ratio: 3
Triglycerides: 59 mg/dL (ref 0.0–149.0)
VLDL: 11.8 mg/dL (ref 0.0–40.0)

## 2019-02-19 LAB — BASIC METABOLIC PANEL
BUN: 12 mg/dL (ref 6–23)
CO2: 27 mEq/L (ref 19–32)
Calcium: 9.4 mg/dL (ref 8.4–10.5)
Chloride: 103 mEq/L (ref 96–112)
Creatinine, Ser: 0.84 mg/dL (ref 0.40–1.20)
GFR: 74.88 mL/min (ref >60.00)
Glucose, Bld: 76 mg/dL (ref 70–99)
Potassium: 4.8 mEq/L (ref 3.5–5.1)
Sodium: 138 mEq/L (ref 135–145)

## 2019-02-19 LAB — IBC PANEL
Iron: 60 ug/dL (ref 42–145)
Saturation Ratios: 14.3 % — ABNORMAL LOW (ref 20.0–50.0)
Transferrin: 300 mg/dL (ref 212.0–360.0)

## 2019-02-19 LAB — VITAMIN D 25 HYDROXY (VIT D DEFICIENCY, FRACTURES): VITD: 22.91 ng/mL — ABNORMAL LOW (ref 30.00–100.00)

## 2019-02-19 LAB — VITAMIN B12: Vitamin B-12: 473 pg/mL (ref 211–911)

## 2019-02-20 ENCOUNTER — Other Ambulatory Visit: Payer: Self-pay | Admitting: Internal Medicine

## 2019-02-20 MED ORDER — VITAMIN D (ERGOCALCIFEROL) 1.25 MG (50000 UNIT) PO CAPS: 50000.0000 [IU] | ORAL_CAPSULE | ORAL | 0 refills | Status: DC

## 2019-02-22 ENCOUNTER — Telehealth: Payer: Self-pay

## 2019-02-22 ENCOUNTER — Encounter: Payer: Self-pay | Admitting: Internal Medicine

## 2019-02-22 NOTE — Telephone Encounter (Signed)
-----   Message from Biagio Borg, MD sent at 02/20/2019  2:07 PM EDT ----- Left message on MyChart, pt to cont same tx except  The test results show that your current treatment is OK, as the tests are stable except for mild elevated cholesterol, and low Vitamin D level.  Please: 1) follow a lower cholesterol diet 2) Please take Vitamin D 50000 units weekly for 12 weeks, then plan to change to OTC Vitamin D3 at 2000 units per day, indefinitely.  Shirron to please inform pt, I will do rx

## 2019-02-22 NOTE — Telephone Encounter (Signed)
I believe the Vit D3 OTC also comes in different units such as 3000, or 4000 or 5000 units.  She would just take a total of 50K per wk for 12 weeks, then change to the 2000 units per day

## 2019-02-22 NOTE — Telephone Encounter (Signed)
Pt called back and states that her insurance doesn't cover any of this. Pt wants to know what other option she is supposed to try,.  Pt can be reached at [phone]

## 2019-02-22 NOTE — Telephone Encounter (Signed)
For the second time, please let pt know to take the OTC as per previous note

## 2019-02-22 NOTE — Telephone Encounter (Signed)
Pt has viewed results via MyChart  

## 2019-02-22 NOTE — Telephone Encounter (Signed)
Pt has been informed to check with her insurance company to see what strengths are covered and get back with [place].

## 2019-02-22 NOTE — Telephone Encounter (Signed)
Pt states Vitamin D, Ergocalciferol, (DRISDOL) 1.25 MG (50000 UT) CAPS capsule is not covered by her insurance and she would like to know what other optoins she may have before transitioning to 2000 units per day dosage. Please advise.   [phone]

## 2019-02-23 NOTE — Telephone Encounter (Signed)
Called pt, LVM with details below  

## 2019-02-25 ENCOUNTER — Ambulatory Visit
Admission: RE | Admit: 2019-02-25 | Discharge: 2019-02-25 | Disposition: A | Discharge status: Home / Self Care | Payer: Medicare Other | Source: Ambulatory Visit | Attending: Internal Medicine | Admitting: Internal Medicine

## 2019-02-25 ENCOUNTER — Other Ambulatory Visit: Payer: Self-pay

## 2019-02-25 DIAGNOSIS — N6310 Unspecified lump in the right breast, unspecified quadrant: Secondary | ICD-10-CM

## 2019-02-25 DIAGNOSIS — N6311 Unspecified lump in the right breast, upper outer quadrant: Secondary | ICD-10-CM | POA: Diagnosis not present

## 2019-02-25 DIAGNOSIS — R922 Inconclusive mammogram: Secondary | ICD-10-CM | POA: Diagnosis not present

## 2019-03-01 ENCOUNTER — Ambulatory Visit (INDEPENDENT_AMBULATORY_CARE_PROVIDER_SITE_OTHER): Payer: Medicare Other | Admitting: Internal Medicine

## 2019-03-01 ENCOUNTER — Other Ambulatory Visit: Payer: Self-pay

## 2019-03-01 ENCOUNTER — Encounter: Payer: Self-pay | Admitting: Internal Medicine

## 2019-03-01 VITALS — BP 106/68 | HR 95 | Temp 98.0°F | Ht 62.5 in | Wt 104.0 lb

## 2019-03-01 DIAGNOSIS — F411 Generalized anxiety disorder: Secondary | ICD-10-CM

## 2019-03-01 DIAGNOSIS — E785 Hyperlipidemia, unspecified: Secondary | ICD-10-CM | POA: Diagnosis not present

## 2019-03-01 DIAGNOSIS — E559 Vitamin D deficiency, unspecified: Secondary | ICD-10-CM | POA: Diagnosis not present

## 2019-03-01 MED ORDER — ROSUVASTATIN CALCIUM 20 MG PO TABS: 20.0000 mg | ORAL_TABLET | Freq: Every day | ORAL | 3 refills | Status: DC

## 2019-03-01 NOTE — Assessment & Plan Note (Signed)
encourged cmpliance with vit d replacement

## 2019-03-01 NOTE — Assessment & Plan Note (Signed)
Mild uncontrolled, ok for increased crestor 20 qd, cont low chol diet, goal LDL < 100

## 2019-03-01 NOTE — Progress Notes (Signed)
Subjective:    Patient ID: Karen Hahn, female    DOB: 1976-02-10, 43 y.o.   MRN: [id number]  HPI  Here to f/u; overall doing ok,  Pt denies chest pain, increasing sob or doe, wheezing, orthopnea, PND, increased LE swelling, palpitations, dizziness or syncope.  Pt denies new neurological symptoms such as new headache, or facial or extremity weakness or numbness.  Pt denies polydipsia, polyuria, or low sugar episode.  Pt states overall good compliance with meds, mostly trying to follow appropriate diet, with wt overall stable,  but little exercise however.  Had recent mammogram and noticed in the verbage of the interpretation about consulting the PCP for consideration regarding future testing in light of dense breasts.  Denies worsening depressive symptoms, suicidal ideation, or panic; has ongoing anxiety Past Medical History:  Diagnosis Date  . ADD (attention deficit disorder)   . Anorexia nervosa without bulimia   . Chronic constipation   . Drug abuse (HCC)   . GAD (generalized anxiety disorder)   . GERD (gastroesophageal reflux disease)   . History of gastric restrictive surgery   . History of gastric ulcer   . History of Helicobacter pylori infection    after 2003  . HLD (hyperlipidemia) 02/16/2018  . Impulse control disorder   . Moderate major depression (HCC)   . Osteopenia   . Post concussion syndrome    fell 10/20/2015  . SMAS (superior mesenteric artery syndrome) (HCC)   . Urge urinary incontinence    Past Surgical History:  Procedure Laterality Date  . DILATION AND CURETTAGE OF UTERUS  2005  . INTERSTIM IMPLANT PLACEMENT N/A 10/18/2015   Procedure: [name] IMPLANT FIRST STAGE;  Surgeon: Jerilee Field, MD;  Location: [facility];  Service: Urology;  Laterality: N/A;  . INTERSTIM IMPLANT PLACEMENT N/A 10/18/2015   Procedure: [name] IMPLANT SECOND STAGE;  Surgeon: Jerilee Field, MD;  Location: [facility];  Service: Urology;   Laterality: N/A;  . LAPAROSCOPIC GASTRIC RESTRICTIVE DUODENAL PROCEDURE (DUODENAL SWITCH)  12/ 2003  . ORIF RIGHT WRIST FX  2013   REMOVAL HARDWARE X2 in  2013--  No retained hardware    reports that she quit smoking about 2 years ago. Her smoking use included cigarettes. She has a 0.50 pack-year smoking history. She has never used smokeless tobacco. She reports that she does not drink alcohol or use drugs. family history includes ADD / ADHD in her brother; Alcohol abuse in her maternal uncle and mother; Anxiety disorder in her father, maternal grandmother, and mother; Breast cancer (age of onset: 15) in her paternal grandmother; COPD in her father and mother; Cancer in her mother; Depression in her maternal grandmother and mother; Drug abuse in her father. Allergies  Allergen Reactions  . Bactrim [Sulfamethoxazole-Trimethoprim] Other (See Comments)    Yeast infection  . Penicillins Other (See Comments)    Has patient had a PCN reaction causing immediate rash, facial/tongue/throat swelling, SOB or lightheadedness with hypotension: NO (UTI, YEAST INFECTION) Has patient had a PCN reaction causing severe rash involving mucus membranes or skin necrosis: NO Has patient had a PCN reaction that required hospitalization NO Has patient had a PCN reaction occurring within the last 10 years: NO If all of the above answers are "NO", then may proceed with Cephalosporin use.   . Ciprofloxacin Itching  . Morphine And Related Itching and Other (See Comments)    "whole body feels tight feeling"  . Reglan [Metoclopramide]   . Benadryl [Diphenhydramine Hcl] Palpitations  .  Diflucan [Fluconazole] Rash   Current Outpatient Medications on File Prior to Visit  Medication Sig Dispense Refill  . Biotin 1 MG CAPS Take by mouth daily.    . Calcium-Iron-Vit D-Vit K (CALCIUM SOFT CHEWS) 500-08-998-40 MG-UNT-MCG CHEW Chew by mouth daily.    Marland Kitchen dicyclomine (BENTYL) 10 MG capsule Take 1 capsule by mouth four times daily  before meals and at bedtime. Annual physical due July 2020. 120 capsule 0  . ferrous gluconate (FERGON) 240 (27 FE) MG tablet Take 240 mg by mouth daily after supper.    Marland Kitchen ibuprofen (ADVIL,MOTRIN) 600 MG tablet Take 1 tablet (600 mg total) by mouth every 8 (eight) hours as needed. 90 tablet 2  . lamoTRIgine (LAMICTAL) 150 MG tablet TAKE 2 TABLETS(300 MG) BY MOUTH DAILY 180 tablet 0  . Multiple Vitamins-Minerals (MULTIVITAMIN GUMMIES ADULT) CHEW Chew by mouth.    . pantoprazole (PROTONIX) 40 MG tablet TAKE 1 TABLET BY MOUTH EVERY MORNING 90 tablet 0  . promethazine (PHENERGAN) 25 MG tablet TAKE 1 TABLET(25 MG) BY MOUTH twice daily as needed 60 tablet 2  . Vitamin D, Ergocalciferol, (DRISDOL) 1.25 MG (50000 UT) CAPS capsule Take 1 capsule (50,000 Units total) by mouth every 7 (seven) days. 12 capsule 0   No current facility-administered medications on file prior to visit.    Review of Systems  Constitutional: Negative for other unusual diaphoresis or sweats HENT: Negative for ear discharge or swelling Eyes: Negative for other worsening visual disturbances Respiratory: Negative for stridor or other swelling  Gastrointestinal: Negative for worsening distension or other blood Genitourinary: Negative for retention or other urinary change Musculoskeletal: Negative for other MSK pain or swelling Skin: Negative for color change or other new lesions Neurological: Negative for worsening tremors and other numbness  Psychiatric/Behavioral: Negative for worsening agitation or other fatigue All other system neg per pt    Objective:   Physical Exam BP 106/68   Pulse 95   Temp 98 F (36.7 C) (Oral)   Ht 5' 2.5" (1.588 m)   Wt 104 lb (47.2 kg)   SpO2 96%   BMI 18.72 kg/m  VS noted,  Constitutional: Pt appears in NAD HENT: Head: NCAT.  Right Ear: External ear normal.  Left Ear: External ear normal.  Eyes: . Pupils are equal, round, and reactive to light. Conjunctivae and EOM are normal Nose:  without d/c or deformity Neck: Neck supple. Gross normal ROM Cardiovascular: Normal rate and regular rhythm.   Pulmonary/Chest: Effort normal and breath sounds without rales or wheezing.  Abd:  Soft, NT, ND, + BS, no organomegaly Neurological: Pt is alert. At baseline orientation, motor grossly intact Skin: Skin is warm. No rashes, other new lesions, no LE edema Psychiatric: Pt behavior is normal without agitation  No other exam findings Lab Results  Component Value Date   WBC 6.9 02/18/2019   HGB 12.4 02/18/2019   HCT 37.9 02/18/2019   PLT 267.0 02/18/2019   GLUCOSE 76 02/18/2019   CHOL 208 (H) 02/18/2019   TRIG 59.0 02/18/2019   HDL 64.90 02/18/2019   LDLCALC 131 (H) 02/18/2019   ALT 17 02/18/2019   AST 17 02/18/2019   NA 138 02/18/2019   K 4.8 02/18/2019   CL 103 02/18/2019   CREATININE 0.84 02/18/2019   BUN 12 02/18/2019   CO2 27 02/18/2019   TSH 2.60 02/18/2019   HGBA1C 5.1 02/18/2019   VITAMIN D 25 Hydroxy (Vit-D Deficiency, Fractures) Order: [id number] Status:  Final result Visible to patient:  Yes (  MyChart) Next appt:  09/23/2019 at 03:00 PM in Gynecology (Anastasio Auerbach, MD) Dx:  Vitamin D deficiency  Ref Range & Units 11d ago 70yr ago  VITD 30.00 - 100.00 ng/mL 22.91Low   38.83            Assessment & Plan:

## 2019-03-01 NOTE — Patient Instructions (Signed)
Ok to increase the crestor to 20 mg per day  Please continue all other medications as before, and refills have been done if requested.  Please have the pharmacy call with any other refills you may need.  Please continue your efforts at being more active, low cholesterol diet, and weight control.  Please keep your appointments with your specialists as you may have planned

## 2019-03-01 NOTE — Assessment & Plan Note (Signed)
stable overall by history and exam, recent data reviewed with pt, and pt to continue medical treatment as before,  to f/u any worsening symptoms or concerns  

## 2019-03-08 ENCOUNTER — Other Ambulatory Visit: Payer: Self-pay | Admitting: Internal Medicine

## 2019-03-09 MED ORDER — DICYCLOMINE HCL 10 MG PO CAPS: ORAL_CAPSULE | ORAL | 1 refills | Status: DC

## 2019-03-11 ENCOUNTER — Encounter: Payer: Self-pay | Admitting: Internal Medicine

## 2019-03-12 MED ORDER — IBUPROFEN 600 MG PO TABS: 600.0000 mg | ORAL_TABLET | Freq: Three times a day (TID) | ORAL | 2 refills | Status: DC | PRN

## 2019-03-25 ENCOUNTER — Other Ambulatory Visit: Payer: Self-pay

## 2019-03-25 ENCOUNTER — Encounter (HOSPITAL_COMMUNITY): Payer: Self-pay | Admitting: Psychiatry

## 2019-03-25 ENCOUNTER — Ambulatory Visit (INDEPENDENT_AMBULATORY_CARE_PROVIDER_SITE_OTHER): Payer: Medicare Other | Admitting: Psychiatry

## 2019-03-25 DIAGNOSIS — F639 Impulse disorder, unspecified: Secondary | ICD-10-CM | POA: Diagnosis not present

## 2019-03-25 DIAGNOSIS — F332 Major depressive disorder, recurrent severe without psychotic features: Secondary | ICD-10-CM

## 2019-03-25 DIAGNOSIS — F411 Generalized anxiety disorder: Secondary | ICD-10-CM | POA: Diagnosis not present

## 2019-03-25 DIAGNOSIS — F5000 Anorexia nervosa, unspecified: Secondary | ICD-10-CM | POA: Diagnosis not present

## 2019-03-25 DIAGNOSIS — G4700 Insomnia, unspecified: Secondary | ICD-10-CM

## 2019-03-25 DIAGNOSIS — F1290 Cannabis use, unspecified, uncomplicated: Secondary | ICD-10-CM

## 2019-03-25 DIAGNOSIS — F900 Attention-deficit hyperactivity disorder, predominantly inattentive type: Secondary | ICD-10-CM

## 2019-03-25 MED ORDER — LAMOTRIGINE 150 MG PO TABS: ORAL_TABLET | ORAL | 0 refills | Status: DC

## 2019-03-25 NOTE — Progress Notes (Signed)
Virtual Visit via Telephone Note  I connected with Karen Hahn on 03/25/19 at  1:30 PM EDT by telephone and verified that I am speaking with the correct person using two identifiers.  Location: Patient: in car- advised pt to pull over but she stated she feels safe driving and talking  Provider: home   I discussed the limitations, risks, security and privacy concerns of performing an evaluation and management service by telephone and the availability of in person appointments. I also discussed with the patient that there may be a patient responsible charge related to this service. The patient expressed understanding and agreed to proceed.   History of Present Illness: Pt reports she made a new friend who is a recovering addict. Her friend did heroin and was going thru withdrawal and called Karen Hahn for help. It was traumatic to see her friend like that. Since then they have talked. Karen Hahn has put up good boundaries with her. Karen Hahn feels she is doing well and does not want to risk relapse. Her anxiety is unchanged. She does not know if she is stressed but has been having headaches for 1 week straight. She has been talking to her mom and mom is a big stressor. Karen Hahn plans to go to her PCP for evaluation next week. She states her depression is ok. She denies SI/HI. Karen Hahn is eating really well and has no weight concerns at this time.    Observations/Objective: I spoke with Karen Hahn on the phone.  Pt was calm, pleasant and cooperative.  Pt was engaged in the conversation and answered questions appropriately.  Speech was clear and coherent with normal rate, tone and volume.  Mood is anxious, affect is full. Thought processes are coherent, goal oriented and intact.  Thought content is logical.  Pt denies SI/HI.   Pt denies auditory and visual hallucinations and did not appear to be responding to internal stimuli.  Memory and concentration are good.  Fund of knowledge and use of language  are average.  Insight and judgment are fair.  I am unable to comment on psychomotor activity, general appearance, hygiene, or eye contact as I was unable to physically see the patient on the phone.  Vital signs not available since interview conducted virtually.    I reviewed the information below on 03/25/2019 and have updated it Assessment and Plan:   MDD- recurrent, severe w/o psychotic features vs Bipolar 2 d/o Anorexia nervosa Impulse control d/o GAD ADHD- inattentive type Insomnia Cannabis use d/o   Lamictal 300mg  po qD Pt is managing symptoms of anorexia, GAD, ADHD, insomnia on her own   Follow Up Instructions: In 2-3 months or sooner if needed   I discussed the assessment and treatment plan with the patient. The patient was provided an opportunity to ask questions and all were answered. The patient agreed with the plan and demonstrated an understanding of the instructions.   The patient was advised to call back or seek an in-person evaluation if the symptoms worsen or if the condition fails to improve as anticipated.  I provided 20 minutes of non-face-to-face time during this encounter.   Charlcie Cradle, MD

## 2019-04-05 ENCOUNTER — Encounter: Payer: Self-pay | Admitting: Internal Medicine

## 2019-04-10 ENCOUNTER — Encounter: Payer: Self-pay | Admitting: Internal Medicine

## 2019-04-13 NOTE — Telephone Encounter (Signed)
Staff to contact pt - ok for ROV at her convenience

## 2019-04-14 ENCOUNTER — Encounter: Payer: Self-pay | Admitting: Internal Medicine

## 2019-04-14 ENCOUNTER — Ambulatory Visit (INDEPENDENT_AMBULATORY_CARE_PROVIDER_SITE_OTHER): Payer: Medicare Other | Admitting: Internal Medicine

## 2019-04-14 ENCOUNTER — Other Ambulatory Visit: Payer: Self-pay

## 2019-04-14 VITALS — BP 122/76 | HR 116 | Temp 98.4°F | Ht 62.5 in | Wt 105.0 lb

## 2019-04-14 DIAGNOSIS — R519 Headache, unspecified: Secondary | ICD-10-CM

## 2019-04-14 DIAGNOSIS — F329 Major depressive disorder, single episode, unspecified: Secondary | ICD-10-CM

## 2019-04-14 DIAGNOSIS — R739 Hyperglycemia, unspecified: Secondary | ICD-10-CM

## 2019-04-14 DIAGNOSIS — F32A Depression, unspecified: Secondary | ICD-10-CM

## 2019-04-14 DIAGNOSIS — R51 Headache: Secondary | ICD-10-CM

## 2019-04-14 MED ORDER — RIZATRIPTAN BENZOATE 10 MG PO TBDP: 10.0000 mg | ORAL_TABLET | ORAL | 11 refills | Status: DC | PRN

## 2019-04-14 MED ORDER — TOPIRAMATE 50 MG PO TABS: 50.0000 mg | ORAL_TABLET | Freq: Two times a day (BID) | ORAL | 11 refills | Status: DC

## 2019-04-14 NOTE — Patient Instructions (Signed)
Please take all new medication as prescribed - the generic maxalt as needed  Please take all new medication as prescribed - the topamax by starting with HALF tab at bedtime for 3 nights, then whole pill at bedtime for 3 night, then add Half pill in the AM and 1 pill in the PM, and then go to whole pill twice per day after that  Please continue all other medications as before, and refills have been done if requested.  Please have the pharmacy call with any other refills you may need.  Please continue your efforts at being more active, low cholesterol diet, and weight control.  Please keep your appointments with your specialists as you may have planned  You will be contacted regarding the referral for: MRI brain, and Neurology

## 2019-04-14 NOTE — Assessment & Plan Note (Signed)
stable overall by history and exam, recent data reviewed with pt, and pt to continue medical treatment as before,  to f/u any worsening symptoms or concerns  

## 2019-04-14 NOTE — Assessment & Plan Note (Addendum)
C/w likely transformed migraine vs other by hx, for trial imitrex prn, topamax asd preventive, Head MRI due to new onset severest HA of her life, and neurology referral per pt request  Note:  Total time for pt hx, exam, review of record with pt in the room, determination of diagnoses and plan for further eval and tx is > 40 min, with over 50% spent in coordination and counseling of patient including the differential dx, tx, further evaluation and other management of intractable HA, hyperglycemia, depression

## 2019-04-14 NOTE — Progress Notes (Signed)
Subjective:    Patient ID: Karen Hahn, female    DOB: 06-Dec-1975, 43 y.o.   MRN: [id number]  HPI   Here with c/o 3 wks onset daily HA, forehead and bitemporal, throbbing with intermittent photophobia, no prior hx, gradually worsening each day now severe and nsaid and otcs no help.  Wakes up with it daily. No fever, n/v just feels drained and tired all the time. Pt denies new neurological symptoms such as new facial or extremity weakness or numbness.  Has ongoing social stressors, but Denies worsening depressive symptoms, suicidal ideation, or panic.  Pt denies chest pain, increased sob or doe, wheezing, orthopnea, PND, increased LE swelling, palpitations, dizziness or syncope.   Pt denies polydipsia, polyuria Past Medical History:  Diagnosis Date  . ADD (attention deficit disorder)   . Anorexia nervosa without bulimia   . Chronic constipation   . Drug abuse ([place])   . GAD (generalized anxiety disorder)   . GERD (gastroesophageal reflux disease)   . History of gastric restrictive surgery   . History of gastric ulcer   . History of Helicobacter pylori infection    after 2003  . HLD (hyperlipidemia) 02/16/2018  . Impulse control disorder   . Moderate major depression ([place])   . Osteopenia   . Post concussion syndrome    fell 10/20/2015  . SMAS (superior mesenteric artery syndrome) ([place])   . Urge urinary incontinence    Past Surgical History:  Procedure Laterality Date  . DILATION AND CURETTAGE OF UTERUS  2005  . INTERSTIM IMPLANT PLACEMENT N/A 10/18/2015   Procedure: Barrie Lyme IMPLANT FIRST STAGE;  Surgeon: Festus Aloe, MD;  Location[facility];  Service: Urology;  Laterality: N/A;  . INTERSTIM IMPLANT PLACEMENT N/A 10/18/2015   Procedure: Barrie Lyme IMPLANT SECOND STAGE;  Surgeon: Festus Aloe, MD;  Location: [facility];  Service: Urology;  Laterality: N/A;  . LAPAROSCOPIC GASTRIC RESTRICTIVE DUODENAL PROCEDURE (DUODENAL SWITCH)  12/ 2003  .  ORIF RIGHT WRIST FX  2013   REMOVAL HARDWARE X2 in  2013--  No retained hardware    reports that she quit smoking about 2 years ago. Her smoking use included cigarettes. She has a 0.50 pack-year smoking history. She has never used smokeless tobacco. She reports that she does not drink alcohol or use drugs. family history includes ADD / ADHD in her brother; Alcohol abuse in her maternal uncle and mother; Anxiety disorder in her father, maternal grandmother, and mother; Breast cancer (age of onset: 69) in her paternal grandmother; COPD in her father and mother; Cancer in her mother; Depression in her maternal grandmother and mother; Drug abuse in her father. Allergies  Allergen Reactions  . Bactrim [Sulfamethoxazole-Trimethoprim] Other (See Comments)    Yeast infection  . Penicillins Other (See Comments)    Has patient had a PCN reaction causing immediate rash, facial/tongue/throat swelling, SOB or lightheadedness with hypotension: NO (UTI, YEAST INFECTION) Has patient had a PCN reaction causing severe rash involving mucus membranes or skin necrosis: NO Has patient had a PCN reaction that required hospitalization NO Has patient had a PCN reaction occurring within the last 10 years: NO If all of the above answers are "NO", then may proceed with Cephalosporin use.   . Ciprofloxacin Itching  . Morphine And Related Itching and Other (See Comments)    "whole body feels tight feeling"  . Reglan [Metoclopramide]   . Benadryl [Diphenhydramine Hcl] Palpitations  . Diflucan [Fluconazole] Rash   Current Outpatient Medications on  File Prior to Visit  Medication Sig Dispense Refill  . Calcium-Iron-Vit D-Vit K (CALCIUM SOFT CHEWS) 500-08-998-40 MG-UNT-MCG CHEW Chew by mouth daily.    Marland Kitchen dicyclomine (BENTYL) 10 MG capsule Take 1 capsule by mouth four times daily before meals and at bedtime. 120 capsule 1  . ferrous gluconate (FERGON) 240 (27 FE) MG tablet Take 240 mg by mouth daily after supper.    Marland Kitchen  ibuprofen (ADVIL) 600 MG tablet Take 1 tablet (600 mg total) by mouth every 8 (eight) hours as needed. 90 tablet 2  . lamoTRIgine (LAMICTAL) 150 MG tablet TAKE 2 TABLETS(300 MG) BY MOUTH DAILY 180 tablet 0  . Multiple Vitamins-Minerals (MULTIVITAMIN GUMMIES ADULT) CHEW Chew by mouth.    . pantoprazole (PROTONIX) 40 MG tablet TAKE 1 TABLET BY MOUTH EVERY MORNING 90 tablet 0  . promethazine (PHENERGAN) 25 MG tablet TAKE 1 TABLET(25 MG) BY MOUTH twice daily as needed 60 tablet 2  . rosuvastatin (CRESTOR) 20 MG tablet Take 1 tablet (20 mg total) by mouth daily. 90 tablet 3  . Vitamin D, Ergocalciferol, (DRISDOL) 1.25 MG (50000 UT) CAPS capsule Take 1 capsule (50,000 Units total) by mouth every 7 (seven) days. 12 capsule 0   No current facility-administered medications on file prior to visit.    Review of Systems  Constitutional: Negative for other unusual diaphoresis or sweats HENT: Negative for ear discharge or swelling Eyes: Negative for other worsening visual disturbances Respiratory: Negative for stridor or other swelling  Gastrointestinal: Negative for worsening distension or other blood Genitourinary: Negative for retention or other urinary change Musculoskeletal: Negative for other MSK pain or swelling Skin: Negative for color change or other new lesions Neurological: Negative for worsening tremors and other numbness  Psychiatric/Behavioral: Negative for worsening agitation or other fatigue All other system neg per pt    Objective:   Physical Exam BP 122/76   Pulse (!) 116   Temp 98.4 F (36.9 C) (Oral)   Ht 5' 2.5" (1.588 m)   Wt 105 lb (47.6 kg)   SpO2 97%   BMI 18.90 kg/m  VS noted,  Constitutional: Pt appears in NAD HENT: Head: NCAT.  Right Ear: External ear normal.  Left Ear: External ear normal.  Eyes: . Pupils are equal, round, and reactive to light. Conjunctivae and EOM are normal Nose: without d/c or deformity Neck: Neck supple. Gross normal ROM Cardiovascular:  Normal rate and regular rhythm.   Pulmonary/Chest: Effort normal and breath sounds without rales or wheezing.  Abd:  Soft, NT, ND, + BS, no organomegaly Neurological: Pt is alert. At baseline orientation, motor 5/5 intact, cn 2-12 intact Skin: Skin is warm. No rashes, other new lesions, no LE edema Psychiatric: Pt behavior is normal without agitation  No other exam findings  Lab Results  Component Value Date   WBC 6.9 02/18/2019   HGB 12.4 02/18/2019   HCT 37.9 02/18/2019   PLT 267.0 02/18/2019   GLUCOSE 76 02/18/2019   CHOL 208 (H) 02/18/2019   TRIG 59.0 02/18/2019   HDL 64.90 02/18/2019   LDLCALC 131 (H) 02/18/2019   ALT 17 02/18/2019   AST 17 02/18/2019   NA 138 02/18/2019   K 4.8 02/18/2019   CL 103 02/18/2019   CREATININE 0.84 02/18/2019   BUN 12 02/18/2019   CO2 27 02/18/2019   TSH 2.60 02/18/2019   HGBA1C 5.1 02/18/2019      Assessment & Plan:

## 2019-04-19 ENCOUNTER — Other Ambulatory Visit (HOSPITAL_COMMUNITY): Payer: Self-pay | Admitting: Psychiatry

## 2019-04-19 DIAGNOSIS — F639 Impulse disorder, unspecified: Secondary | ICD-10-CM

## 2019-04-20 ENCOUNTER — Encounter: Payer: Self-pay | Admitting: Internal Medicine

## 2019-04-21 ENCOUNTER — Encounter: Payer: Self-pay | Admitting: Internal Medicine

## 2019-04-27 ENCOUNTER — Encounter: Payer: Self-pay | Admitting: Gynecology

## 2019-04-29 NOTE — Progress Notes (Signed)
NEUROLOGY CONSULTATION NOTE  MADHURI ALPHONSE MRN: [id number] DOB: [birth year]  Referring provider: Oliver Barre, MD Primary care provider: Oliver Barre, MD  Reason for consult:  headaches  HISTORY OF PRESENT ILLNESS: Karen Hahn is a 43 year old female with ADD, generalized anxiety disorder, depression and history of concussion and substance abuse who presents for headaches.  History supplemented by referring provider note.  No prior history of headache.  No preceding trigger such as recent head trauma, new medication or exposure to new environmentIn July, she had 3 episodes where she woke up with blurred vision for 5 minutes and resolved.  In August, she began experiencing a daily headaches that soon became a persistent headache that has been gradually increasing in intensity.  They are now severe, throbbing, bitemporal and frontal headaches associated with intermittent photophobia. On a few occasions, she had felt nauseous and vomited.  She reports fatigue.  No fever, chills,unilateral numbness or weakness.  Sometimes closing her eyes help reduce intensity.  She has prior concussion in 2017 but no recent preceding head trauma.  History of depression and anxiety but this has been stable.    She was prescribed rizatriptan and topiramate by her PCP.  Rizatriptan helped a little bit.  Due to potential anorexia, she did not start topiramate.  She was told by radiology that she cannot have a brain MRI due to the interstem implant in her back.  Current NSAIDS: none Current analgesics:  none Current triptans:  none Current ergotamine:  none Current anti-emetic:  Promethazine 25mg  Current muscle relaxants:  none Current anti-anxiolytic:  none Current sleep aide:  none Current Antihypertensive medications:  none Current Antidepressant medications:  none Current Anticonvulsant medications:  Lamotrigine 300mg  daily Current anti-CGRP:  none Current Vitamins/Herbal/Supplements:  D, ferrous  gluconate Current Antihistamines/Decongestants:  none Other therapy:  none Hormone/birth control:  none  Past NSAIDS:  Ibuprofen (not effective) Past analgesics:  tramadol Past abortive triptans:  Maxalt 10mg  Past abortive ergotamine:  none Past muscle relaxants:  Flexeril Past anti-emetic:  Reglan, Zofran 8mg  Past antihypertensive medications:  none Past antidepressant/antipsychotic medications:  Bupropion, Haldol, mirtazapine, Zyprexa, sertraline 200mg  Past anticonvulsant medications:  topiramate (contraindicated due to history of anorexia) Past anti-CGRP:  none Past vitamins/Herbal/Supplements:  none Past antihistamines/decongestants:  Zyrtec Other past therapies:  none  Caffeine:  1 cup of coffee daily Diet:  Tries to drink water throughout day.  Occasional soda. Exercise:  No Depression:  Not currently; Anxiety:  Yes, but manageable Other pain:  no Sleep hygiene:  Good.  6-8 hours a night Family history of headache:  Mother (migraines, stopped after CEA for bilateral ICA stenosis.  CBC, BMP and hepatic panel from 02/18/2019 were unremarkable.  PAST MEDICAL HISTORY: Past Medical History:  Diagnosis Date  . ADD (attention deficit disorder)   . Anorexia nervosa without bulimia   . Chronic constipation   . Drug abuse ([facility])   . GAD (generalized anxiety disorder)   . GERD (gastroesophageal reflux disease)   . History of gastric restrictive surgery   . History of gastric ulcer   . History of Helicobacter pylori infection    after 2003  . HLD (hyperlipidemia) 02/16/2018  . Impulse control disorder   . Moderate major depression ([facility])   . Osteopenia   . Post concussion syndrome    fell 10/20/2015  . SMAS (superior mesenteric artery syndrome) ([facility])   . Urge urinary incontinence     PAST SURGICAL HISTORY: Past Surgical History:  Procedure Laterality Date  .  DILATION AND CURETTAGE OF UTERUS  2005  . INTERSTIM IMPLANT PLACEMENT N/A 10/18/2015   Procedure: Barrie Lyme IMPLANT  FIRST STAGE;  Surgeon: Festus Aloe, MD;  Location: [facility];  Service: Urology;  Laterality: N/A;  . INTERSTIM IMPLANT PLACEMENT N/A 10/18/2015   Procedure: Barrie Lyme IMPLANT SECOND STAGE;  Surgeon: Festus Aloe, MD;  Location: [facility];  Service: Urology;  Laterality: N/A;  . LAPAROSCOPIC GASTRIC RESTRICTIVE DUODENAL PROCEDURE (DUODENAL SWITCH)  12/ 2003  . ORIF RIGHT WRIST FX  2013   REMOVAL HARDWARE X2 in  2013--  No retained hardware    MEDICATIONS: Current Outpatient Medications on File Prior to Visit  Medication Sig Dispense Refill  . Calcium-Iron-Vit D-Vit K (CALCIUM SOFT CHEWS) [id number] MG-UNT-MCG CHEW Chew by mouth daily.    Marland Kitchen dicyclomine (BENTYL) 10 MG capsule Take 1 capsule by mouth four times daily before meals and at bedtime. 120 capsule 1  . ferrous gluconate (FERGON) 240 (27 FE) MG tablet Take 240 mg by mouth daily after supper.    Marland Kitchen ibuprofen (ADVIL) 600 MG tablet Take 1 tablet (600 mg total) by mouth every 8 (eight) hours as needed. 90 tablet 2  . lamoTRIgine (LAMICTAL) 150 MG tablet TAKE 2 TABLETS(300 MG) BY MOUTH DAILY 180 tablet 0  . Multiple Vitamins-Minerals (MULTIVITAMIN GUMMIES ADULT) CHEW Chew by mouth.    . pantoprazole (PROTONIX) 40 MG tablet TAKE 1 TABLET BY MOUTH EVERY MORNING 90 tablet 0  . promethazine (PHENERGAN) 25 MG tablet TAKE 1 TABLET(25 MG) BY MOUTH twice daily as needed 60 tablet 2  . rizatriptan (MAXALT-MLT) 10 MG disintegrating tablet Take 1 tablet (10 mg total) by mouth as needed for migraine. May repeat in 2 hours if needed 10 tablet 11  . rosuvastatin (CRESTOR) 20 MG tablet Take 1 tablet (20 mg total) by mouth daily. 90 tablet 3  . topiramate (TOPAMAX) 50 MG tablet Take 1 tablet (50 mg total) by mouth 2 (two) times daily. 60 tablet 11  . Vitamin D, Ergocalciferol, (DRISDOL) 1.25 MG (50000 UT) CAPS capsule Take 1 capsule (50,000 Units total) by mouth every 7 (seven) days. 12 capsule 0   No current  facility-administered medications on file prior to visit.     ALLERGIES: Allergies  Allergen Reactions  . Bactrim [Sulfamethoxazole-Trimethoprim] Other (See Comments)    Yeast infection  . Penicillins Other (See Comments)    Has patient had a PCN reaction causing immediate rash, facial/tongue/throat swelling, SOB or lightheadedness with hypotension: NO (UTI, YEAST INFECTION) Has patient had a PCN reaction causing severe rash involving mucus membranes or skin necrosis: NO Has patient had a PCN reaction that required hospitalization NO Has patient had a PCN reaction occurring within the last 10 years: NO If all of the above answers are "NO", then may proceed with Cephalosporin use.   . Ciprofloxacin Itching  . Morphine And Related Itching and Other (See Comments)    "whole body feels tight feeling"  . Reglan [Metoclopramide]   . Benadryl [Diphenhydramine Hcl] Palpitations  . Diflucan [Fluconazole] Rash    FAMILY HISTORY: Family History  Problem Relation Age of Onset  . Alcohol abuse Mother   . Anxiety disorder Mother   . Depression Mother        severe with hx of suicide attempt. treated with ECT  . COPD Mother   . Cancer Mother        Cervical  . Anxiety disorder Father   . Drug abuse Father   . COPD Father   .  Alcohol abuse Maternal Uncle   . Anxiety disorder Maternal Grandmother   . Depression Maternal Grandmother   . ADD / ADHD Brother   . Breast cancer Paternal Grandmother [id number]  . Stomach cancer Neg Hx   . Colon cancer Neg Hx   . Rectal cancer Neg Hx   . Esophageal cancer Neg Hx     SOCIAL HISTORY: Social History   Socioeconomic History  . Marital status: Divorced    Spouse name: Not on file  . Number of children: 0  . Years of education: 5814  . Highest education level: Not on file  Occupational History  . Occupation: Disability  Social Needs  . Financial resource strain: Not on file  . Food insecurity    Worry: Sometimes true    Inability: Never true  .  Transportation needs    Medical: Yes    Non-medical: Yes  Tobacco Use  . Smoking status: Former Smoker    Packs/day: 0.50    Years: 1.00    Pack years: 0.50    Types: Cigarettes    Quit date: 12/03/2016    Years since quitting: 2.4  . Smokeless tobacco: Never Used  . Tobacco comment: quit smoking 8 months ago as of 10/15/18  Substance and Sexual Activity  . Alcohol use: No    Alcohol/week: 0.0 standard drinks  . Drug use: No    Frequency: 7.0 times per week    Types: Marijuana, "Crack" cocaine, Cocaine    Comment: hx of and last used cocaine 2012. she was smoking marjiuana 7g/week. smoking thru out the day but quit 4 months ago  . Sexual activity: Not Currently    Comment: 1st intercourse 43 yo-Fewer than 5 partners  Lifestyle  . Physical activity    Days per week: 0 days    Minutes per session: 0 min  . Stress: Not on file  Relationships  . Social Musicianconnections    Talks on phone: Not on file    Gets together: Not on file    Attends religious service: Not on file    Active member of club or organization: Not on file    Attends meetings of clubs or organizations: Not on file    Relationship status: Not on file  . Intimate partner violence    Fear of current or ex partner: Not on file    Emotionally abused: Not on file    Physically abused: Not on file    Forced sexual activity: Not on file  Other Topics Concern  . Not on file  Social History Narrative   Fun: Play piano; Build Lego   Denies abuse and feels safe at home. Living with friend in [place].     REVIEW OF SYSTEMS: Constitutional: No fevers, chills, or sweats, no generalized fatigue, change in appetite Eyes: No visual changes, double vision, eye pain Ear, nose and throat: No hearing loss, ear pain, nasal congestion, sore throat Cardiovascular: No chest pain, palpitations Respiratory:  No shortness of breath at rest or with exertion, wheezes GastrointestinaI: No nausea, vomiting, diarrhea, abdominal pain,  fecal incontinence Genitourinary:  No dysuria, urinary retention or frequency Musculoskeletal:  No neck pain, back pain Integumentary: No rash, pruritus, skin lesions Neurological: as above Psychiatric: No depression, insomnia, anxiety Endocrine: No palpitations, fatigue, diaphoresis, mood swings, change in appetite, change in weight, increased thirst Hematologic/Lymphatic:  No purpura, petechiae. Allergic/Immunologic: no itchy/runny eyes, nasal congestion, recent allergic reactions, rashes  PHYSICAL EXAM: Blood pressure 109/70, pulse 99, temperature 98.5  F (36.9 C), height 5\' 3"  (1.6 m), weight 104 lb 9.6 oz (47.4 kg), SpO2 96 %. General: No acute distress.  Patient appears well-groomed.   Head:  Normocephalic/atraumatic Eyes:  fundi examined but not visualized Neck: supple, no paraspinal tenderness, full range of motion Back: No paraspinal tenderness Heart: regular rate and rhythm Lungs: Clear to auscultation bilaterally. Vascular: No carotid bruits. Neurological Exam: Mental status: alert and oriented to person, place, and time, recent and remote memory intact, fund of knowledge intact, attention and concentration intact, speech fluent and not dysarthric, language intact. Cranial nerves: CN I: not tested CN II: pupils equal, round and reactive to light, visual fields intact CN III, IV, VI:  full range of motion, no nystagmus, no ptosis CN V: facial sensation intact CN VII: upper and lower face symmetric CN VIII: hearing intact CN IX, X: gag intact, uvula midline CN XI: sternocleidomastoid and trapezius muscles intact CN XII: tongue midline Bulk & Tone: normal, no fasciculations. Motor:  5/5 throughout  Sensation: temperature and vibration sensation intact.  Deep Tendon Reflexes:  2+ throughout, toes downgoing.   Finger to nose testing:  Without dysmetria.   Heel to shin:  Without dysmetria.   Gait:  Normal station and stride.  Able to turn and tandem walk. Romberg negative.   IMPRESSION: New daily persistent headache  PLAN: 1. We will check CTA of head ane neck to evaluate for secondary intracranial etiology and intracranial or extracranial vascular etiology.  2. Start nortriptyline 10mg  at bedtime.  If headaches not improved in 4 weeks, contact me and we can increase dose. 3.  When headaches become more intense, then try naproxen 500mg .  May take up to twice daily if needed. 4.  Limit use of naproxen to no more than 2 days out of week to prevent risk of rebound or medication-overuse headache. 5.  Keep headache diary 6.  Exercise, hydration, caffeine cessation, sleep hygiene, monitor for and avoid triggers 7.  Consider:  magnesium citrate 400mg  daily, riboflavin 400mg  daily, and coenzyme Q10 100mg  three times daily 8. Always keep in mind that currently taking a hormone or birth control may be a possible trigger or aggravating factor for migraine. 9. Follow up in 3 to 4 months.  Thank you for allowing me to take part in the care of this patient.  , DO  CC: , MD

## 2019-05-02 ENCOUNTER — Other Ambulatory Visit: Payer: Medicare Other

## 2019-05-03 ENCOUNTER — Encounter: Payer: Self-pay | Admitting: Neurology

## 2019-05-03 ENCOUNTER — Other Ambulatory Visit: Payer: Self-pay

## 2019-05-03 ENCOUNTER — Other Ambulatory Visit: Payer: Self-pay | Admitting: *Deleted

## 2019-05-03 ENCOUNTER — Ambulatory Visit (INDEPENDENT_AMBULATORY_CARE_PROVIDER_SITE_OTHER): Payer: Medicare Other | Admitting: Neurology

## 2019-05-03 VITALS — BP 109/70 | HR 99 | Temp 98.5°F | Ht 63.0 in | Wt 104.6 lb

## 2019-05-03 DIAGNOSIS — G4452 New daily persistent headache (NDPH): Secondary | ICD-10-CM | POA: Diagnosis not present

## 2019-05-03 MED ORDER — NAPROXEN 500 MG PO TABS: 500.0000 mg | ORAL_TABLET | Freq: Two times a day (BID) | ORAL | 3 refills | Status: DC | PRN

## 2019-05-03 MED ORDER — NORTRIPTYLINE HCL 10 MG PO CAPS: 10.0000 mg | ORAL_CAPSULE | Freq: Every day | ORAL | 3 refills | Status: DC

## 2019-05-03 NOTE — Patient Instructions (Signed)
1. We will check CTA of head ane neck.  2. Start nortriptyline 10mg  at bedtime.  If headaches not improved in 4 weeks, contact me and we can increase dose. 3.  When headaches become more intense, then try naproxen 500mg .  May take up to twice daily if needed. 4.  Limit use of naproxen to no more than 2 days out of week to prevent risk of rebound or medication-overuse headache. 5.  Keep headache diary 6.  Exercise, hydration, caffeine cessation, sleep hygiene, monitor for and avoid triggers 7.  Consider:  magnesium citrate 400mg  daily, riboflavin 400mg  daily, and coenzyme Q10 100mg  three times daily 8. Always keep in mind that currently taking a hormone or birth control may be a possible trigger or aggravating factor for migraine. 9. Follow up in 3 to 4 months.

## 2019-05-09 ENCOUNTER — Encounter: Payer: Self-pay | Admitting: Internal Medicine

## 2019-05-11 ENCOUNTER — Telehealth: Payer: Self-pay | Admitting: *Deleted

## 2019-05-11 DIAGNOSIS — G4452 New daily persistent headache (NDPH): Secondary | ICD-10-CM

## 2019-05-11 NOTE — Addendum Note (Signed)
Addended by: Jesse Fall on: 05/11/2019 10:09 AM   Modules accepted: Orders

## 2019-05-11 NOTE — Telephone Encounter (Signed)
For this patient, I ordered CTA of head and neck to evaluate for new daily persistent headache, not CT soft tissue of neck.

## 2019-05-11 NOTE — Telephone Encounter (Signed)
Corrected - ordered CTA head and neck. Called Thena at [facility] and she was abl eto cancel prior order not needed for CT of head. I cancelled CT neck.

## 2019-05-11 NOTE — Telephone Encounter (Signed)
Per [facility] the CT neck must be WITH contrast only (entered with and without 9/28).  They also need diagnosis code for the CT neck. Order re-entered.   They have order for CT head and nothing further needed for this.

## 2019-05-13 ENCOUNTER — Telehealth: Payer: Self-pay | Admitting: *Deleted

## 2019-05-13 NOTE — Telephone Encounter (Signed)
Patient sent in patient message (my chart) regarding order for [facility] not being correct.  See note yesterday 1000 had called [facility] and correct order for CTA head and neck entered.  Called back and spoke to [facility] today and they confirmed they have the correct orders in from yesterday and the old ones from 9/28 are d/c.  Left message on patient's cell phone with above info. [facility] to call and schedule with patient.

## 2019-05-19 ENCOUNTER — Ambulatory Visit
Admission: RE | Admit: 2019-05-19 | Discharge: 2019-05-19 | Disposition: A | Discharge status: Home / Self Care | Payer: Medicare Other | Source: Ambulatory Visit | Attending: Neurology | Admitting: Neurology

## 2019-05-19 DIAGNOSIS — R519 Headache, unspecified: Secondary | ICD-10-CM | POA: Diagnosis not present

## 2019-05-19 DIAGNOSIS — G4452 New daily persistent headache (NDPH): Secondary | ICD-10-CM

## 2019-05-19 MED ORDER — IOPAMIDOL (ISOVUE-370) INJECTION 76%
75.0000 mL | Freq: Once | INTRAVENOUS | Status: AC | PRN
  Administered 2019-05-19: 75 mL via INTRAVENOUS

## 2019-05-20 ENCOUNTER — Telehealth: Payer: Self-pay

## 2019-05-20 MED ORDER — VENLAFAXINE HCL ER 37.5 MG PO CP24: 37.5000 mg | ORAL_CAPSULE | Freq: Every day | ORAL | 5 refills | Status: DC

## 2019-05-20 NOTE — Telephone Encounter (Signed)
-----   Message from Pieter Partridge, DO sent at 05/20/2019  6:58 AM EDT ----- CTA of head and neck is normal

## 2019-05-20 NOTE — Telephone Encounter (Signed)
Called patient no answer left message scans normal.

## 2019-05-21 ENCOUNTER — Other Ambulatory Visit: Payer: Self-pay | Admitting: Neurology

## 2019-05-21 MED ORDER — AIMOVIG 70 MG/ML ~~LOC~~ SOAJ: 70.0000 mg | SUBCUTANEOUS | 11 refills | Status: DC

## 2019-05-25 ENCOUNTER — Encounter: Payer: Self-pay | Admitting: *Deleted

## 2019-05-25 NOTE — Progress Notes (Addendum)
Karen Hahn (Key: [id number]) Rx #: [phone] Aimovig 70MG /ML auto-injectors   Form [employer] Medicare Part D Electronic Prior Authorization Form (2017 NCPDP) Created 5 days ago Sent to Plan 18 hours ago Plan Response 18 hours ago Submit Clinical Questions 18 hours ago Determination Favorable 18 hours ago Message from Plan Request Reference Number: [id number]. AIMOVIG INJ 70MG /ML is approved through 08/04/2020. For further questions, call [phone].  Karen Hahn. [place], [place] [address] Hours of Operations: Address: 5 a.m. - 10 p.m. PT, Monday-Friday [id number] 6 a.m. - 3 p.m. PT, [place] [address] Date: 05/25/2019 To: Karen Clines From: [employer] Phone: [phone] Phone: [phone] Fax: [phone] Reference #: [id number] RE: Prior Authorization Request Patient Name: Karen Hahn Patient DOB: 04/16/1976 Patient ID: [id number] Status of Request: Approve Medication Name: Aimovig Inj 70mg /Ml GPI/NDC: [id number] D520 Decision Notes: AIMOVIG INJ 70MG /ML, use as directed, is approved through 08/04/2020 under your Medicare Part D benefit. Reviewed by: System If the treating physician would like to discuss this coverage decision with the physician or health care professional reviewer, please call [employer] Prior Authorization department at [phone].

## 2019-05-26 ENCOUNTER — Encounter: Payer: Self-pay | Admitting: Internal Medicine

## 2019-05-27 MED ORDER — NYSTATIN 100000 UNIT/ML MT SUSP: 5.0000 mL | Freq: Four times a day (QID) | OROMUCOSAL | 0 refills | Status: DC

## 2019-05-28 ENCOUNTER — Other Ambulatory Visit: Payer: Self-pay | Admitting: Neurology

## 2019-05-28 MED ORDER — SUMATRIPTAN SUCCINATE 100 MG PO TABS: ORAL_TABLET | ORAL | 3 refills | Status: DC

## 2019-05-30 ENCOUNTER — Encounter: Payer: Self-pay | Admitting: Internal Medicine

## 2019-05-31 ENCOUNTER — Encounter: Payer: Self-pay | Admitting: Internal Medicine

## 2019-05-31 MED ORDER — NYSTATIN 100000 UNIT/ML MT SUSP: 5.0000 mL | Freq: Four times a day (QID) | OROMUCOSAL | 0 refills | Status: AC

## 2019-05-31 MED ORDER — UBRELVY 100 MG PO TABS: 1.0000 | ORAL_TABLET | ORAL | 0 refills | Status: DC | PRN

## 2019-06-03 ENCOUNTER — Encounter (HOSPITAL_COMMUNITY): Payer: Self-pay | Admitting: Psychiatry

## 2019-06-03 ENCOUNTER — Other Ambulatory Visit: Payer: Self-pay

## 2019-06-03 ENCOUNTER — Ambulatory Visit (INDEPENDENT_AMBULATORY_CARE_PROVIDER_SITE_OTHER): Payer: Medicare Other | Admitting: Psychiatry

## 2019-06-03 DIAGNOSIS — F339 Major depressive disorder, recurrent, unspecified: Secondary | ICD-10-CM | POA: Diagnosis not present

## 2019-06-03 DIAGNOSIS — F900 Attention-deficit hyperactivity disorder, predominantly inattentive type: Secondary | ICD-10-CM

## 2019-06-03 DIAGNOSIS — F5000 Anorexia nervosa, unspecified: Secondary | ICD-10-CM | POA: Diagnosis not present

## 2019-06-03 DIAGNOSIS — F411 Generalized anxiety disorder: Secondary | ICD-10-CM | POA: Diagnosis not present

## 2019-06-03 DIAGNOSIS — F639 Impulse disorder, unspecified: Secondary | ICD-10-CM | POA: Diagnosis not present

## 2019-06-03 DIAGNOSIS — G4700 Insomnia, unspecified: Secondary | ICD-10-CM

## 2019-06-03 MED ORDER — LAMOTRIGINE 150 MG PO TABS: ORAL_TABLET | ORAL | 0 refills | Status: DC

## 2019-06-03 NOTE — Progress Notes (Signed)
Virtual Visit via Telephone Note  I connected with Karen Hahn  on 06/03/19 at  1:00 PM EDT by telephone and verified that I am speaking with the correct person using two identifiers.  Location: Patient: home Provider: office   I discussed the limitations, risks, security and privacy concerns of performing an evaluation and management service by telephone and the availability of in person appointments. I also discussed with the patient that there may be a patient responsible charge related to this service. The patient expressed understanding and agreed to proceed.   History of Present Illness: Karen Hahn tells me that she is doing well.  Her depression is stable.  The only time that she really notes a increase in depression and irritability is around the time of her..  She is denying anhedonia and isolation.  She denies hopelessness.  Sleep is good.  She denies SI/HI.  Karen Hahn tells me that she is very excited because the baby is due in another month.  She has a lot of hope because she wants to be a good role model.  She has stayed away from all drug use.  Karen Hahn has been thinking about how to deal with her mother.  She is decided that she would like to set some firm boundaries.  She is denying any impulsive or aggressive behaviors as of late.  She tells me that she is eating well.  She has not gained any weight but she is not losing weight either.  She denies any issues with starving herself, using laxatives or other exercising or other means of weight loss.  She states that "my anxiety will always be there" but states that it is manageable.  She went to the neurologist for treatment of her migraines.  They tried to put her on several different antidepressants.  Due to her history of abuse of SSRIs she declined.    Observations/Objective: I spoke with Karen Hahn on the phone.  Pt was calm, pleasant and cooperative.  Pt was engaged in the conversation and answered questions  appropriately.  Speech was clear and coherent with normal rate, tone and volume.  Mood is euthymic and affect is full. Thought processes are coherent, goal oriented and intact.  Thought content is logical.  Pt denies SI/HI.   Pt denies auditory and visual hallucinations and did not appear to be responding to internal stimuli.  Memory and concentration are good.  Fund of knowledge and use of language are average.  Insight and judgment are fair.  I am unable to comment on psychomotor activity, general appearance, hygiene, or eye contact as I was unable to physically see the patient on the phone.  Vital signs not available since interview conducted virtually.    I reviewed the information below on 06/03/2019 and have updated it Assessment and Plan: MDD- recurrent, severe w/o psychotic features vs Bipolar 2 d/o- stable Anorexia nervosa- stable Impulse control d/o GAD- manageable ADHD- inattentive type Insomnia Cannabis use d/o- in early remission    Lamictal 300mg  po qD Pt is managing symptoms of anorexia, GAD, ADHD, insomnia on her own    Follow Up Instructions: In 12 weeks or sooner if needed   I discussed the assessment and treatment plan with the patient. The patient was provided an opportunity to ask questions and all were answered. The patient agreed with the plan and demonstrated an understanding of the instructions.   The patient was advised to call back or seek an in-person evaluation if the symptoms worsen  or if the condition fails to improve as anticipated.  I provided 25 minutes of non-face-to-face time during this encounter.   Charlcie Cradle, MD

## 2019-06-15 ENCOUNTER — Encounter: Payer: Self-pay | Admitting: Internal Medicine

## 2019-07-22 NOTE — Progress Notes (Signed)
Virtual Visit via Video Note The purpose of this virtual visit is to provide medical care while limiting exposure to the novel coronavirus.    Consent was obtained for video visit:  Yes.   Answered questions that patient had about telehealth interaction:  Yes.   I discussed the limitations, risks, security and privacy concerns of performing an evaluation and management service by telemedicine. I also discussed with the patient that there may be a patient responsible charge related to this service. The patient expressed understanding and agreed to proceed.  Pt location: Home Physician Location: office Name of referring provider:  Corwin LevinsJohn, [name], MD I connected with Martasia A Dentinger at patients initiation/request on 07/23/2019 at  2:50 PM EST by video enabled telemedicine application and verified that I am speaking with the correct person using two identifiers. Pt MRN:  [id number] Pt DOB:  05-17-1976 Video Participants:  Birdia A Michalec   History of Present Illness:  Karen Hahn is a 43 year old female with ADD, generalized anxiety disorder, depression and history of concussion and substance abuse who follows up for headache.  UPDATE: 05/19/2019 CTA of head and neck:  Normal.  Nortriptyline caused increased photosensitivity.  She was switched to Aimovig.  Bernita RaisinUbrelvy is helpful too.  Migraines have been doing well.    She is mostly concerned about other symptoms that she has experienced for about 3 years.  She frequently feels cold.  She reports that her body (particularly hands and feet) fell cold (internally but also to the touch).  No numbness, tingling, skin discoloration, palpitations, increased/decreased sweating, or lightheadedness.  TSH and B12 from July were 2.60 and 473 respectively.  Current NSAIDS: none Current analgesics:  none Current triptans:  none Current ergotamine:  none Current anti-emetic:  Promethazine 25mg  Current muscle relaxants:  none Current anti-anxiolytic:   none Current sleep aide:  none Current Antihypertensive medications:  none Current Antidepressant medications:  none Current Anticonvulsant medications:  Lamotrigine 300mg  daily Current anti-CGRP:  Aimovig 70mg  monthly; Ubrelvy Current Vitamins/Herbal/Supplements:  D, ferrous gluconate Current Antihistamines/Decongestants:  none Other therapy:  none Hormone/birth control:  none  Caffeine:  1 cup of coffee daily Diet:  Tries to drink water throughout day.  Occasional soda. Exercise:  No Depression:  Not currently; Anxiety:  Yes, but manageable Other pain:  no Sleep hygiene:  Good.  6-8 hours a night  HISTORY: No prior history of headache.  No preceding trigger such as recent head trauma, new medication or exposure to new environmentIn July, she had 3 episodes where she woke up with blurred vision for 5 minutes and resolved.  In August, she began experiencing a daily headaches that soon became a persistent headache that has been gradually increasing in intensity.  They are now severe, throbbing, bitemporal and frontal headaches associated with intermittent photophobia. On a few occasions, she had felt nauseous and vomited.  She reports fatigue.  No fever, chills,unilateral numbness or weakness.  Sometimes closing her eyes help reduce intensity.  She has prior concussion in 2017 but no recent preceding head trauma.  History of depression and anxiety but this has been stable.    She was prescribed rizatriptan and topiramate by her PCP.  Rizatriptan helped a little bit.  Due to potential anorexia, she did not start topiramate.  She was told by radiology that she cannot have a brain MRI due to the interstem implant in her back.   Past NSAIDS:  Ibuprofen (not effective) Past analgesics:  tramadol Past abortive triptans:  Maxalt 10mg  (cannot take any serotonin-related medications due to history of overdose) Past abortive ergotamine:  none Past muscle relaxants:  Flexeril Past anti-emetic:   Reglan, Zofran 8mg  Past antihypertensive medications:  none Past antidepressant/antipsychotic medications:  nortriptyline (photosensitivity), bupropion, Haldol, mirtazapine, Zyprexa, sertraline 200mg  Past anticonvulsant medications:  topiramate (contraindicated due to history of anorexia) Past anti-CGRP:  none Past vitamins/Herbal/Supplements:  none Past antihistamines/decongestants:  Zyrtec Other past therapies:  none   Family history of headache:  Mother (migraines, stopped after CEA for bilateral ICA stenosis.  Past Medical History: Past Medical History:  Diagnosis Date   ADD (attention deficit disorder)    Anorexia nervosa without bulimia    Chronic constipation    Drug abuse (HCC)    GAD (generalized anxiety disorder)    GERD (gastroesophageal reflux disease)    History of gastric restrictive surgery    History of gastric ulcer    History of Helicobacter pylori infection    after 2003   HLD (hyperlipidemia) 02/16/2018   Impulse control disorder    Moderate major depression (HCC)    Osteopenia    Post concussion syndrome    fell 10/20/2015   SMAS (superior mesenteric artery syndrome) (HCC)    Urge urinary incontinence     Medications: Outpatient Encounter Medications as of 07/23/2019  Medication Sig Note   Calcium-Iron-Vit D-Vit K (CALCIUM SOFT CHEWS) 500-08-998-40 MG-UNT-MCG CHEW Chew by mouth daily.    dicyclomine (BENTYL) 10 MG capsule Take 1 capsule by mouth four times daily before meals and at bedtime.    Erenumab-aooe (AIMOVIG) 70 MG/ML SOAJ Inject 70 mg into the skin every 30 (thirty) days.    ferrous gluconate (FERGON) 240 (27 FE) MG tablet Take 240 mg by mouth daily after supper.    ibuprofen (ADVIL) 600 MG tablet Take 1 tablet (600 mg total) by mouth every 8 (eight) hours as needed.    lamoTRIgine (LAMICTAL) 150 MG tablet TAKE 2 TABLETS(300 MG) BY MOUTH DAILY    Multiple Vitamins-Minerals (MULTIVITAMIN GUMMIES ADULT) CHEW Chew by mouth.  03/06/2017: Takes 2 daily.    naproxen (NAPROSYN) 500 MG tablet Take 1 tablet (500 mg total) by mouth 2 (two) times daily as needed.    pantoprazole (PROTONIX) 40 MG tablet TAKE 1 TABLET BY MOUTH EVERY MORNING    promethazine (PHENERGAN) 25 MG tablet TAKE 1 TABLET(25 MG) BY MOUTH twice daily as needed    rizatriptan (MAXALT-MLT) 10 MG disintegrating tablet Take 1 tablet (10 mg total) by mouth as needed for migraine. May repeat in 2 hours if needed 05/03/2019: Insurance not covered   rosuvastatin (CRESTOR) 20 MG tablet Take 1 tablet (20 mg total) by mouth daily.    SUMAtriptan (IMITREX) 100 MG tablet Take 1 tablet earliest onset of headache.  May repeat in 2 hours if headache persists or recurs.  Maximum 2 tablets in 24 hours.    Ubrogepant (UBRELVY) 100 MG TABS Take 1 capsule by mouth as needed (may repeat dose in 2 hours if needed max of 200 mg within 24 hours).    Vitamin D, Ergocalciferol, (DRISDOL) 1.25 MG (50000 UT) CAPS capsule Take 1 capsule (50,000 Units total) by mouth every 7 (seven) days.    No facility-administered encounter medications on file as of 07/23/2019.    Allergies: Allergies  Allergen Reactions   Bactrim [Sulfamethoxazole-Trimethoprim] Other (See Comments)    Yeast infection   Penicillins Other (See Comments)    Has patient had a PCN reaction causing immediate rash, facial/tongue/throat swelling, SOB or lightheadedness  with hypotension: NO (UTI, YEAST INFECTION) Has patient had a PCN reaction causing severe rash involving mucus membranes or skin necrosis: NO Has patient had a PCN reaction that required hospitalization NO Has patient had a PCN reaction occurring within the last 10 years: NO If all of the above answers are "NO", then may proceed with Cephalosporin use.    Ciprofloxacin Itching   Morphine And Related Itching and Other (See Comments)    "whole body feels tight feeling"   Reglan [Metoclopramide]    Benadryl [Diphenhydramine Hcl] Palpitations     Diflucan [Fluconazole] Rash    Family History: Family History  Problem Relation Age of Onset   Alcohol abuse Mother    Anxiety disorder Mother    Depression Mother        severe with hx of suicide attempt. treated with ECT   COPD Mother    Cancer Mother        Cervical   Anxiety disorder Father    Drug abuse Father    COPD Father    Alcohol abuse Maternal Uncle    Anxiety disorder Maternal Grandmother    Depression Maternal Grandmother    ADD / ADHD Brother    Breast cancer Paternal Grandmother 5   Stomach cancer Neg Hx    Colon cancer Neg Hx    Rectal cancer Neg Hx    Esophageal cancer Neg Hx     Social History: Social History   Socioeconomic History   Marital status: Divorced    Spouse name: Not on file   Number of children: 0   Years of education: 14   Highest education level: Not on file  Occupational History   Occupation: Disability  Tobacco Use   Smoking status: Former Smoker    Packs/day: 0.50    Years: 1.00    Pack years: 0.50    Types: Cigarettes    Quit date: 12/03/2016    Years since quitting: 2.6   Smokeless tobacco: Never Used   Tobacco comment: quit smoking 8 months ago as of 10/15/18  Substance and Sexual Activity   Alcohol use: No    Alcohol/week: 0.0 standard drinks   Drug use: No    Frequency: 7.0 times per week    Types: Marijuana, "Crack" cocaine, Cocaine    Comment: hx of and last used cocaine 2012. she was smoking marjiuana 7g/week. smoking thru out the day but quit 4 months ago   Sexual activity: Not Currently    Comment: 1st intercourse 43 yo-Fewer than 5 partners  Other Topics Concern   Not on file  Social History Narrative   Fun: Play piano; Build Lego   Denies abuse and feels safe at home. Living with friend in [place].       Drink coffee 1 cup eveyday   Leave with 2 friends   1 story home, 16 steps   Social Determinants of Health   Financial Resource Strain:    Difficulty of Paying  Living Expenses: Not on file  Food Insecurity:    Worried About Programme researcher, broadcasting/film/video in the Last Year: Not on file   [employer] of Food in the Last Year: Not on file  Transportation Needs:    Lack of Transportation (Medical): Not on file   Lack of Transportation (Non-Medical): Not on file  Physical Activity:    Days of Exercise per Week: Not on file   Minutes of Exercise per Session: Not on file  Stress:    Feeling  of Stress : Not on file  Social Connections:    Frequency of Communication with Friends and Family: Not on file   Frequency of Social Gatherings with Friends and Family: Not on file   Attends Religious Services: Not on file   Active Member of Clubs or Organizations: Not on file   Attends Archivist Meetings: Not on file   Marital Status: Not on file  Intimate Partner Violence:    Fear of Current or Ex-Partner: Not on file   Emotionally Abused: Not on file   Physically Abused: Not on file   Sexually Abused: Not on file    Observations/Objective:   Height 5\' 3"  (1.6 m), weight 105 lb (47.6 kg). No acute distress.  Alert and oriented.  Speech fluent and not dysarthric.  Language intact.  Eyes orthophoric on primary gaze.  Face symmetric.  Assessment and Plan:   1.  Cold intolerance/feels cold.  Suspicion for a neurological disorder is low.  We will check NCV-EMG to evaluate for any possible polyneuropathy.  Follow Up Instructions:    -I discussed the assessment and treatment plan with the patient. The patient was provided an opportunity to ask questions and all were answered. The patient agreed with the plan and demonstrated an understanding of the instructions.   The patient was advised to call back or seek an in-person evaluation if the symptoms worsen or if the condition fails to improve as anticipated.    Total Time spent in visit with the patient was:  15 minutes   Dudley Major, DO

## 2019-07-23 ENCOUNTER — Telehealth (INDEPENDENT_AMBULATORY_CARE_PROVIDER_SITE_OTHER): Payer: Medicare Other | Admitting: Neurology

## 2019-07-23 ENCOUNTER — Encounter: Payer: Self-pay | Admitting: Neurology

## 2019-07-23 ENCOUNTER — Other Ambulatory Visit: Payer: Self-pay

## 2019-07-23 VITALS — Ht 63.0 in | Wt 105.0 lb

## 2019-07-23 DIAGNOSIS — R6889 Other general symptoms and signs: Secondary | ICD-10-CM | POA: Diagnosis not present

## 2019-07-23 DIAGNOSIS — G629 Polyneuropathy, unspecified: Secondary | ICD-10-CM

## 2019-07-23 MED ORDER — UBRELVY 100 MG PO TABS: 1.0000 | ORAL_TABLET | ORAL | 11 refills | Status: DC | PRN

## 2019-07-27 ENCOUNTER — Encounter: Payer: Self-pay | Admitting: Internal Medicine

## 2019-07-27 ENCOUNTER — Encounter: Payer: Self-pay | Admitting: *Deleted

## 2019-07-27 NOTE — Progress Notes (Addendum)
Karen Hahn (Key: [id number]) Rx #: [id number] Karen Hahn 100MG  tablets   Form [facility] Medicare Part D Electronic Prior Authorization Form (2017 NCPDP) Created 17 hours ago Sent to Plan 19 minutes ago Plan Response 18 minutes ago Submit Clinical Questions less than a minute ago Determination Wait for Determination Please wait for [facility] [employer] 2017 NCPDP to return a determination.

## 2019-07-27 NOTE — Progress Notes (Signed)
Date: 07/27/2019 To: Metta Clines From: [facility] Phone: [phone] Phone: [phone] Fax: [phone] Reference #: [id number] RE: Prior Authorization Request Patient Name: Karen Hahn Patient DOB: 23-Mar-1976 Patient ID: [id number] Status of Request: Approve Medication Name: Roselyn Meier Tab 100mg  GPI/NDC: [id number] Decision Notes: UBRELVY TAB 100MG , use as directed, is approved for non-formulary exception through 08/04/2020 under your Medicare Part D benefit. Reviewed by: System. If the treating physician would like to discuss this coverage decision with the physician or health care professional reviewer, please call [facility] Prior Authorization department at [phone].

## 2019-07-29 ENCOUNTER — Other Ambulatory Visit: Payer: Self-pay

## 2019-07-29 ENCOUNTER — Encounter: Payer: Self-pay | Admitting: Internal Medicine

## 2019-07-29 ENCOUNTER — Ambulatory Visit (INDEPENDENT_AMBULATORY_CARE_PROVIDER_SITE_OTHER)
Admission: RE | Admit: 2019-07-29 | Discharge: 2019-07-29 | Disposition: A | Discharge status: Home / Self Care | Payer: Medicare Other | Source: Ambulatory Visit | Attending: Internal Medicine | Admitting: Internal Medicine

## 2019-07-29 ENCOUNTER — Ambulatory Visit (INDEPENDENT_AMBULATORY_CARE_PROVIDER_SITE_OTHER): Payer: Medicare Other | Admitting: Internal Medicine

## 2019-07-29 VITALS — BP 108/64 | HR 82 | Temp 98.4°F | Ht 63.0 in | Wt 104.0 lb

## 2019-07-29 DIAGNOSIS — M79641 Pain in right hand: Secondary | ICD-10-CM

## 2019-07-29 DIAGNOSIS — R739 Hyperglycemia, unspecified: Secondary | ICD-10-CM

## 2019-07-29 DIAGNOSIS — E785 Hyperlipidemia, unspecified: Secondary | ICD-10-CM

## 2019-07-29 DIAGNOSIS — E559 Vitamin D deficiency, unspecified: Secondary | ICD-10-CM

## 2019-07-29 DIAGNOSIS — M25531 Pain in right wrist: Secondary | ICD-10-CM

## 2019-07-29 LAB — HEPATIC FUNCTION PANEL
ALT: 18 U/L (ref 0–35)
AST: 20 U/L (ref 0–37)
Albumin: 4.6 g/dL (ref 3.5–5.2)
Alkaline Phosphatase: 73 U/L (ref 39–117)
Bilirubin, Direct: 0.1 mg/dL (ref 0.0–0.3)
Total Bilirubin: 0.5 mg/dL (ref 0.2–1.2)
Total Protein: 7.2 g/dL (ref 6.0–8.3)

## 2019-07-29 LAB — LIPID PANEL
Cholesterol: 196 mg/dL (ref 0–200)
HDL: 60 mg/dL (ref >39.00)
LDL Cholesterol: 125 mg/dL — ABNORMAL HIGH (ref 0–99)
NonHDL: 135.96
Total CHOL/HDL Ratio: 3
Triglycerides: 54 mg/dL (ref 0.0–149.0)
VLDL: 10.8 mg/dL (ref 0.0–40.0)

## 2019-07-29 LAB — VITAMIN D 25 HYDROXY (VIT D DEFICIENCY, FRACTURES): VITD: 24.15 ng/mL — ABNORMAL LOW (ref 30.00–100.00)

## 2019-07-29 NOTE — Patient Instructions (Addendum)
Ok to try the OTC Voltaren gel as needed for pain  Please continue all other medications as before, and refills have been done if requested.  Please have the pharmacy call with any other refills you may need.  Please continue your efforts at being more active, low cholesterol diet, and weight control. Please keep your appointments with your specialists as you may have planned  Please go to the XRAY Department in the first floor for the x-ray testing  Please go to the LAB at the blood drawing area for the tests to be done  You will be contacted by phone if any changes need to be made immediately.  Otherwise, you will receive a letter about your results with an explanation, but please check with MyChart first.  Please remember to sign up for MyChart if you have not done so, as this will be important to you in the future with finding out test results, communicating by private email, and scheduling acute appointments online when needed.  Please return in 6 months, or sooner if needed

## 2019-07-29 NOTE — Progress Notes (Signed)
Subjective:    Patient ID: Karen Hahn, female    DOB: 15-Oct-1975, 43 y.o.   MRN: [id number]  HPI  Here with c/o "cold hands and feet" intermittent for several years without color change or swelling or other skin change mild intermittent, nothing seems to make better or worse except maybe colder environ.  Also c/o persistent ? Worsening pain to the right wrist which is s/p trauma and prior surgury without swelling locally or tender or redness for > 1 mo, mild intermittent.  Has not seen hand surgury recently.  Also with c/o palmar pain and tenderness with a sense of swelling and tends to use the hand as her dominant but without specific activity she is aware of repetitive use.  No trauma, fever, hx of gout.  Pt denies chest pain, increased sob or doe, wheezing, orthopnea, PND, increased LE swelling, palpitations, dizziness or syncope.  Pt denies new neurological symptoms such as new headache, or facial or extremity weakness or numbness   Pt denies polydipsia, polyuria.  Of note pt is taking vit d and statin, asks for f/u labs as well Past Medical History:  Diagnosis Date  . ADD (attention deficit disorder)   . Anorexia nervosa without bulimia   . Chronic constipation   . Drug abuse ([place])   . GAD (generalized anxiety disorder)   . GERD (gastroesophageal reflux disease)   . History of gastric restrictive surgery   . History of gastric ulcer   . History of Helicobacter pylori infection    after 2003  . HLD (hyperlipidemia) 02/16/2018  . Impulse control disorder   . Moderate major depression ([place])   . Osteopenia   . Post concussion syndrome    fell 10/20/2015  . SMAS (superior mesenteric artery syndrome) ([place])   . Urge urinary incontinence    Past Surgical History:  Procedure Laterality Date  . DILATION AND CURETTAGE OF UTERUS  2005  . INTERSTIM IMPLANT PLACEMENT N/A 10/18/2015   Procedure: Barrie Lyme IMPLANT FIRST STAGE;  Surgeon: Festus Aloe, MD;  Location: [facility];  Service: Urology;  Laterality: N/A;  . INTERSTIM IMPLANT PLACEMENT N/A 10/18/2015   Procedure: Barrie Lyme IMPLANT SECOND STAGE;  Surgeon: Festus Aloe, MD;  Location: [facility];  Service: Urology;  Laterality: N/A;  . LAPAROSCOPIC GASTRIC RESTRICTIVE DUODENAL PROCEDURE (DUODENAL SWITCH)  12/ 2003  . ORIF RIGHT WRIST FX  2013   REMOVAL HARDWARE X2 in  2013--  No retained hardware    reports that she quit smoking about 2 years ago. Her smoking use included cigarettes. She has a 0.50 pack-year smoking history. She has never used smokeless tobacco. She reports that she does not drink alcohol or use drugs. family history includes ADD / ADHD in her brother; Alcohol abuse in her maternal uncle and mother; Anxiety disorder in her father, maternal grandmother, and mother; Breast cancer (age of onset: 34) in her paternal grandmother; COPD in her father and mother; Cancer in her mother; Depression in her maternal grandmother and mother; Drug abuse in her father. Allergies  Allergen Reactions  . Bactrim [Sulfamethoxazole-Trimethoprim] Other (See Comments)    Yeast infection  . Penicillins Other (See Comments)    Has patient had a PCN reaction causing immediate rash, facial/tongue/throat swelling, SOB or lightheadedness with hypotension: NO (UTI, YEAST INFECTION) Has patient had a PCN reaction causing severe rash involving mucus membranes or skin necrosis: NO Has patient had a PCN reaction that required hospitalization NO Has patient had a PCN  reaction occurring within the last 10 years: NO If all of the above answers are "NO", then may proceed with Cephalosporin use.   . Ciprofloxacin Itching  . Morphine And Related Itching and Other (See Comments)    "whole body feels tight feeling"  . Reglan [Metoclopramide]   . Benadryl [Diphenhydramine Hcl] Palpitations  . Diflucan [Fluconazole] Rash   Current Outpatient Medications on File Prior to Visit  Medication Sig Dispense  Refill  . Calcium-Iron-Vit D-Vit K (CALCIUM SOFT CHEWS) [id number] MG-UNT-MCG CHEW Chew by mouth daily.    Marland Kitchen dicyclomine (BENTYL) 10 MG capsule Take 1 capsule by mouth four times daily before meals and at bedtime. 120 capsule 1  . Erenumab-aooe (AIMOVIG) 70 MG/ML SOAJ Inject 70 mg into the skin every 30 (thirty) days. 1 pen 11  . ferrous gluconate (FERGON) 240 (27 FE) MG tablet Take 240 mg by mouth daily after supper.    Marland Kitchen ibuprofen (ADVIL) 600 MG tablet Take 1 tablet (600 mg total) by mouth every 8 (eight) hours as needed. 90 tablet 2  . lamoTRIgine (LAMICTAL) 150 MG tablet TAKE 2 TABLETS(300 MG) BY MOUTH DAILY 180 tablet 0  . Multiple Vitamins-Minerals (MULTIVITAMIN GUMMIES ADULT) CHEW Chew by mouth.    . naproxen (NAPROSYN) 500 MG tablet Take 1 tablet (500 mg total) by mouth 2 (two) times daily as needed. 20 tablet 3  . pantoprazole (PROTONIX) 40 MG tablet TAKE 1 TABLET BY MOUTH EVERY MORNING 90 tablet 0  . promethazine (PHENERGAN) 25 MG tablet TAKE 1 TABLET(25 MG) BY MOUTH twice daily as needed 60 tablet 2  . rizatriptan (MAXALT-MLT) 10 MG disintegrating tablet Take 1 tablet (10 mg total) by mouth as needed for migraine. May repeat in 2 hours if needed 10 tablet 11  . rosuvastatin (CRESTOR) 20 MG tablet Take 1 tablet (20 mg total) by mouth daily. 90 tablet 3  . SUMAtriptan (IMITREX) 100 MG tablet Take 1 tablet earliest onset of headache.  May repeat in 2 hours if headache persists or recurs.  Maximum 2 tablets in 24 hours. 10 tablet 3  . Ubrogepant (UBRELVY) 100 MG TABS Take 1 tablet by mouth as needed (May repeat dose after 2 hours.  Maximum 2 tablets in 24 hours). 16 tablet 11   No current facility-administered medications on file prior to visit.   Review of Systems  Constitutional: Negative for other unusual diaphoresis or sweats HENT: Negative for ear discharge or swelling Eyes: Negative for other worsening visual disturbances Respiratory: Negative for stridor or other swelling    Gastrointestinal: Negative for worsening distension or other blood Genitourinary: Negative for retention or other urinary change Musculoskeletal: Negative for other MSK pain or swelling Skin: Negative for color change or other new lesions Neurological: Negative for worsening tremors and other numbness  Psychiatric/Behavioral: Negative for worsening agitation or other fatigue All otherwise neg per pt     Objective:   Physical Exam BP 108/64   Pulse 82   Temp 98.4 F (36.9 C)   Ht 5\' 3"  (1.6 m)   Wt 104 lb (47.2 kg)   LMP 07/15/2019   SpO2 99%   BMI 18.42 kg/m  VS noted,  Constitutional: Pt appears in NAD HENT: Head: NCAT.  Right Ear: External ear normal.  Left Ear: External ear normal.  Eyes: . Pupils are equal, round, and reactive to light. Conjunctivae and EOM are normal Nose: without d/c or deformity Neck: Neck supple. Gross normal ROM Cardiovascular: Normal rate and regular rhythm.   Pulmonary/Chest: Effort  normal and breath sounds without rales or wheezing.  Abd:  Soft, NT, ND, + BS, no organomegaly Neurological: Pt is alert. At baseline orientation, motor grossly intact Skin: Skin is warm. No rashes, other new lesions, no LE edema Psychiatric: Pt behavior is normal without agitation  All otherwise neg per pt Lab Results  Component Value Date   WBC 6.9 02/18/2019   HGB 12.4 02/18/2019   HCT 37.9 02/18/2019   PLT 267.0 02/18/2019   GLUCOSE 76 02/18/2019   CHOL 196 07/29/2019   TRIG 54.0 07/29/2019   HDL 60.00 07/29/2019   LDLCALC 125 (H) 07/29/2019   ALT 18 07/29/2019   AST 20 07/29/2019   NA 138 02/18/2019   K 4.8 02/18/2019   CL 103 02/18/2019   CREATININE 0.84 02/18/2019   BUN 12 02/18/2019   CO2 27 02/18/2019   TSH 2.60 02/18/2019   HGBA1C 5.1 02/18/2019      Assessment & Plan:

## 2019-08-01 ENCOUNTER — Other Ambulatory Visit: Payer: Self-pay | Admitting: Internal Medicine

## 2019-08-01 MED ORDER — VITAMIN D (ERGOCALCIFEROL) 1.25 MG (50000 UNIT) PO CAPS: 50000.0000 [IU] | ORAL_CAPSULE | ORAL | 0 refills | Status: DC

## 2019-08-02 ENCOUNTER — Encounter: Payer: Self-pay | Admitting: Internal Medicine

## 2019-08-02 NOTE — Assessment & Plan Note (Addendum)
Exam benign, but suspect old injury, cant r/o wrist djd, for film and refer hand surgury per pt reqeuest  Note:  Total time for pt hx, exam, review of record with pt in the room, determination of diagnoses and plan for further eval and tx is > 40 min, with over 50% spent in coordination and counseling of patient including the differential dx, tx, further evaluation and other management of right wrist pain, right hand pain, vit d deficiency, HLD, hyperglycemia,

## 2019-08-02 NOTE — Assessment & Plan Note (Signed)
stable overall by history and exam, recent data reviewed with pt, and pt to continue medical treatment as before,  to f/u any worsening symptoms or concerns, for f/u lab 

## 2019-08-02 NOTE — Assessment & Plan Note (Signed)
C/w palmar tendonitis most likely, for pain control, also f/u hand surgury

## 2019-08-02 NOTE — Progress Notes (Addendum)
LVM regarding lab results--and message My Chart by Dr. Jenny Reichmann if any question please call the office back.  Medical screening examination/treatment/procedure(s) were performed by non-physician practitioner and as supervising physician I was immediately available for consultation/collaboration. I agree with above. Cathlean Cower, MD

## 2019-08-09 ENCOUNTER — Other Ambulatory Visit: Payer: Self-pay | Admitting: Internal Medicine

## 2019-08-09 ENCOUNTER — Encounter: Payer: Self-pay | Admitting: Internal Medicine

## 2019-08-09 MED ORDER — PROMETHAZINE HCL 25 MG PO TABS: ORAL_TABLET | ORAL | 2 refills | Status: DC

## 2019-08-09 NOTE — Telephone Encounter (Signed)
Medication Refill - Medication: promethazine (PHENERGAN) 25 MG tablet 60 tabs  Has the patient contacted their pharmacy? Yes.   (Agent: If no, request that the patient contact the pharmacy for the refill.) (Agent: If yes, when and what did the pharmacy advise?)  Preferred Pharmacy (with phone number or street name): [id number] - [place], [place] - [address]  Agent: Please be advised that RX refills may take up to 3 business days. We ask that you follow-up with your pharmacy.

## 2019-08-09 NOTE — Telephone Encounter (Signed)
Requested medication (s) are due for refill today: yes  Requested medication (s) are on the active medication list: yes  Last refill:  02/18/2019  Future visit scheduled: yes  Notes to clinic:  This refill cannot be delegated    Requested Prescriptions  Pending Prescriptions Disp Refills   promethazine (PHENERGAN) 25 MG tablet 60 tablet 2    Sig: TAKE 1 TABLET(25 MG) BY MOUTH twice daily as needed      There is no refill protocol information for this order

## 2019-08-11 ENCOUNTER — Other Ambulatory Visit: Payer: Self-pay

## 2019-08-11 ENCOUNTER — Ambulatory Visit (INDEPENDENT_AMBULATORY_CARE_PROVIDER_SITE_OTHER): Payer: Medicare Other | Admitting: Neurology

## 2019-08-11 DIAGNOSIS — G629 Polyneuropathy, unspecified: Secondary | ICD-10-CM | POA: Diagnosis not present

## 2019-08-11 DIAGNOSIS — R202 Paresthesia of skin: Secondary | ICD-10-CM

## 2019-08-11 NOTE — Procedures (Signed)
[facility]  [address]  [place], [place] [address] Tel: [phone] Fax:  [phone] Test Date:  08/11/2019  Patient: Karen Hahn DOB: [birth year] Physician: Narda Amber, DO  Sex: Female Height: 5\' 3"  Ref Phys: Metta Clines, DO  ID#: [id number] Temp: 33.0C Technician:    Patient Complaints: This is a 44 year old female referred for evaluation of generalized numbness and tingling of the arms and legs.  NCV & EMG Findings: Extensive electrodiagnostic testing of the right upper and lower extremity shows:  1. Right median, ulnar, mixed palmar, sural, and superficial peroneal sensory responses are within normal limits. 2. Right median, ulnar, peroneal, and tibial motor responses are within normal limits. 3. Right tibial H reflex study is within normal limits. 4. There is no evidence of active or chronic motor axonal loss changes affecting any of the tested muscles.  Motor unit configuration and recruitment pattern is within normal limits.  Impression: This is a normal study of the right upper and lower extremities.  In particular, there is no evidence of a sensorimotor polyneuropathy or cervical/lumbar sacral radiculopathy.   ___________________________ Narda Amber, DO    Nerve Conduction Studies Anti Sensory Summary Table   Site NR Peak (ms) Norm Peak (ms) P-T Amp (V) Norm P-T Amp  Right Median Anti Sensory (2nd Digit)  33C  Wrist    3.1 <3.4 55.2 >20  Right Sup Peroneal Anti Sensory (Ant Lat Mall)  33C  12 cm    1.8 <4.5 15.2 >5  Right Sural Anti Sensory (Lat Mall)  33C  Calf    2.9 <4.5 15.9 >5  Right Ulnar Anti Sensory (5th Digit)  33C  Wrist    2.5 <3.1 50.0 >12   Motor Summary Table   Site NR Onset (ms) Norm Onset (ms) O-P Amp (mV) Norm O-P Amp Site1 Site2 Delta-0 (ms) Dist (cm) Vel (m/s) Norm Vel (m/s)  Right Median Motor (Abd Poll Brev)  33C  Wrist    2.9 <3.9 9.2 >6 Elbow Wrist 4.4 27.0 61 >50  Elbow    7.3  8.7         Right  Peroneal Motor (Ext Dig Brev)  33C  Ankle    3.3 <5.5 3.0 >3 B Fib Ankle 6.7 33.0 49 >40  B Fib    10.0  2.3  Poplt B Fib 1.3 6.0 46 >40  Poplt    11.3  2.3         Right Tibial Motor (Abd Hall Brev)  33C  Ankle    4.3 <6.0 16.3 >8 Knee Ankle 8.7 37.0 43 >40  Knee    13.0  11.1         Right Ulnar Motor (Abd Dig Minimi)  33C  Wrist    2.2 <3.1 12.8 >7 B Elbow Wrist 2.9 20.0 69 >50  B Elbow    5.1  12.7  A Elbow B Elbow 1.6 10.0 62 >50  A Elbow    6.7  11.2          Comparison Summary Table   Site NR Peak (ms) Norm Peak (ms) P-T Amp (V) Site1 Site2 Delta-P (ms) Norm Delta (ms)  Right Median/Ulnar Palm Comparison (Wrist - 8cm)  33C  Median Palm    1.7 <2.2 168.4 Median Palm Ulnar Palm 0.2   Ulnar Palm    1.5 <2.2 56.3       H Reflex Studies   NR H-Lat (ms) Lat Norm (ms) L-R H-Lat (ms)  Right Tibial (Gastroc)  33C     29.25 <35    EMG   Side Muscle Ins Act Fibs Psw Fasc Number Recrt Dur Dur. Amp Amp. Poly Poly. Comment  Right 1stDorInt Nml Nml Nml Nml Nml Nml Nml Nml Nml Nml Nml Nml N/A  Right PronatorTeres Nml Nml Nml Nml Nml Nml Nml Nml Nml Nml Nml Nml N/A  Right Biceps Nml Nml Nml Nml Nml Nml Nml Nml Nml Nml Nml Nml N/A  Right Triceps Nml Nml Nml Nml Nml Nml Nml Nml Nml Nml Nml Nml N/A  Right Deltoid Nml Nml Nml Nml Nml Nml Nml Nml Nml Nml Nml Nml N/A  Right AntTibialis Nml Nml Nml Nml Nml Nml Nml Nml Nml Nml Nml Nml N/A  Right Gastroc Nml Nml Nml Nml Nml Nml Nml Nml Nml Nml Nml Nml N/A  Right Flex Dig Long Nml Nml Nml Nml Nml Nml Nml Nml Nml Nml Nml Nml N/A  Right RectFemoris Nml Nml Nml Nml Nml Nml Nml Nml Nml Nml Nml Nml N/A  Right GluteusMed Nml Nml Nml Nml Nml Nml Nml Nml Nml Nml Nml Nml N/A      Waveforms:

## 2019-08-12 ENCOUNTER — Encounter: Payer: Self-pay | Admitting: Internal Medicine

## 2019-08-17 DIAGNOSIS — M25531 Pain in right wrist: Secondary | ICD-10-CM | POA: Diagnosis not present

## 2019-08-19 ENCOUNTER — Other Ambulatory Visit: Payer: Self-pay | Admitting: Physician Assistant

## 2019-08-19 DIAGNOSIS — M25531 Pain in right wrist: Secondary | ICD-10-CM

## 2019-08-21 ENCOUNTER — Encounter: Payer: Self-pay | Admitting: Internal Medicine

## 2019-09-02 ENCOUNTER — Ambulatory Visit (INDEPENDENT_AMBULATORY_CARE_PROVIDER_SITE_OTHER): Payer: Medicare Other | Admitting: Psychiatry

## 2019-09-02 ENCOUNTER — Encounter (HOSPITAL_COMMUNITY): Payer: Self-pay | Admitting: Psychiatry

## 2019-09-02 ENCOUNTER — Other Ambulatory Visit: Payer: Self-pay

## 2019-09-02 DIAGNOSIS — F5000 Anorexia nervosa, unspecified: Secondary | ICD-10-CM

## 2019-09-02 DIAGNOSIS — G4700 Insomnia, unspecified: Secondary | ICD-10-CM

## 2019-09-02 DIAGNOSIS — F900 Attention-deficit hyperactivity disorder, predominantly inattentive type: Secondary | ICD-10-CM | POA: Diagnosis not present

## 2019-09-02 DIAGNOSIS — F3181 Bipolar II disorder: Secondary | ICD-10-CM

## 2019-09-02 DIAGNOSIS — F411 Generalized anxiety disorder: Secondary | ICD-10-CM

## 2019-09-02 DIAGNOSIS — F1290 Cannabis use, unspecified, uncomplicated: Secondary | ICD-10-CM

## 2019-09-02 DIAGNOSIS — F332 Major depressive disorder, recurrent severe without psychotic features: Secondary | ICD-10-CM

## 2019-09-02 DIAGNOSIS — F639 Impulse disorder, unspecified: Secondary | ICD-10-CM

## 2019-09-02 MED ORDER — LAMOTRIGINE 150 MG PO TABS: ORAL_TABLET | ORAL | 0 refills | Status: DC

## 2019-09-02 NOTE — Progress Notes (Signed)
  Virtual Visit via Telephone Note  I connected with Karen Hahn  on 09/02/19 at  2:00 PM EST by telephone and verified that I am speaking with the correct person using two identifiers.  Location: Patient: home Provider: office   I discussed the limitations, risks, security and privacy concerns of performing an evaluation and management service by telephone and the availability of in person appointments. I also discussed with the patient that there may be a patient responsible charge related to this service. The patient expressed understanding and agreed to proceed.   History of Present Illness: Her friend delivered the baby last month. He is now about 6 weeks. [name] loves him. She enjoys all the time she gets with him. Anxiety remains present but not overwhelming. She is eating well and is not losing weight. [name] has added a boost shake a day. She is sleeping well. Koleen continues to do surveys online to get some money. Depression is mild and she is not really noticing it. She denies manic and hypomanic like symptoms. [name] denies SIB, SI/HI. Her mom is now in an assisted living facility.     Observations/Objective: I spoke with [name] on the phone.  Pt was calm, pleasant and cooperative.  Pt was engaged in the conversation and answered questions appropriately.  Speech was clear and coherent with normal rate, tone and volume.  Mood is depressed and anxious, affect is full. Thought processes are coherent, goal oriented and intact.  Thought content is logical.  Pt denies SI/HI.   Pt denies auditory and visual hallucinations and did not appear to be responding to internal stimuli.  Memory and concentration are good.  Fund of knowledge and use of language are average.  Insight and judgment are fair.  I am unable to comment on psychomotor activity, general appearance, hygiene, or eye contact as I was unable to physically see the patient on the phone.  Vital signs not available  since interview conducted virtually.    I reviewed the information below on 09/02/2019 and have updated it Assessment and Plan: MDD- recurrent, severe w/o psychotic features vs Bipolar 2 d/o- stable Anorexia nervosa- stable Impulse control d/o- stable GAD- manageable ADHD- inattentive type Insomnia- improved Cannabis use d/o- in early remission     Lamictal 300mg  po qD  Pt is managing symptoms of anorexia, GAD, ADHD, insomnia on her own      Follow Up Instructions: In 12 weeks or sooner if needed   I discussed the assessment and treatment plan with the patient. The patient was provided an opportunity to ask questions and all were answered. The patient agreed with the plan and demonstrated an understanding of the instructions.   The patient was advised to call back or seek an in-person evaluation if the symptoms worsen or if the condition fails to improve as anticipated.  I provided 20 minutes of non-face-to-face time during this encounter.   , MD

## 2019-09-03 ENCOUNTER — Ambulatory Visit: Payer: Medicare Other | Admitting: Neurology

## 2019-09-07 ENCOUNTER — Ambulatory Visit
Admission: RE | Admit: 2019-09-07 | Discharge: 2019-09-07 | Disposition: A | Discharge status: Home / Self Care | Payer: Medicare Other | Source: Ambulatory Visit | Attending: Physician Assistant | Admitting: Physician Assistant

## 2019-09-07 ENCOUNTER — Other Ambulatory Visit: Payer: Self-pay

## 2019-09-07 DIAGNOSIS — M25531 Pain in right wrist: Secondary | ICD-10-CM | POA: Diagnosis not present

## 2019-09-07 DIAGNOSIS — S63511A Sprain of carpal joint of right wrist, initial encounter: Secondary | ICD-10-CM | POA: Diagnosis not present

## 2019-09-07 MED ORDER — IOPAMIDOL (ISOVUE-M 200) INJECTION 41%
2.0000 mL | Freq: Once | INTRAMUSCULAR | Status: AC
  Administered 2019-09-07: 2 mL via INTRA_ARTICULAR

## 2019-09-09 ENCOUNTER — Other Ambulatory Visit: Payer: Self-pay | Admitting: Internal Medicine

## 2019-09-14 ENCOUNTER — Encounter: Payer: Self-pay | Admitting: Internal Medicine

## 2019-09-14 DIAGNOSIS — M25531 Pain in right wrist: Secondary | ICD-10-CM | POA: Diagnosis not present

## 2019-09-14 DIAGNOSIS — M79641 Pain in right hand: Secondary | ICD-10-CM | POA: Diagnosis not present

## 2019-09-23 ENCOUNTER — Encounter: Payer: Medicare Other | Admitting: Obstetrics and Gynecology

## 2019-09-23 ENCOUNTER — Encounter: Payer: Medicare Other | Admitting: Gynecology

## 2019-09-28 ENCOUNTER — Other Ambulatory Visit: Payer: Self-pay

## 2019-09-29 ENCOUNTER — Encounter: Payer: Self-pay | Admitting: Obstetrics and Gynecology

## 2019-09-29 ENCOUNTER — Ambulatory Visit (INDEPENDENT_AMBULATORY_CARE_PROVIDER_SITE_OTHER): Payer: Medicare Other | Admitting: Obstetrics and Gynecology

## 2019-09-29 VITALS — BP 118/76 | Ht 63.0 in | Wt 106.0 lb

## 2019-09-29 DIAGNOSIS — Z01419 Encounter for gynecological examination (general) (routine) without abnormal findings: Secondary | ICD-10-CM

## 2019-09-29 DIAGNOSIS — R300 Dysuria: Secondary | ICD-10-CM

## 2019-09-29 DIAGNOSIS — Z9189 Other specified personal risk factors, not elsewhere classified: Secondary | ICD-10-CM | POA: Diagnosis not present

## 2019-09-29 LAB — URINALYSIS, COMPLETE W/RFL CULTURE
Bacteria, UA: NONE SEEN /HPF
Bilirubin Urine: NEGATIVE
Glucose, UA: NEGATIVE
Hgb urine dipstick: NEGATIVE
Hyaline Cast: NONE SEEN /LPF
Ketones, ur: NEGATIVE
Leukocyte Esterase: NEGATIVE
Nitrites, Initial: NEGATIVE
Protein, ur: NEGATIVE
RBC / HPF: NONE SEEN /HPF (ref 0–2)
Specific Gravity, Urine: 1.025 (ref 1.001–1.03)
WBC, UA: NONE SEEN /HPF (ref 0–5)
pH: 7 (ref 5.0–8.0)

## 2019-09-29 LAB — NO CULTURE INDICATED

## 2019-09-29 NOTE — Progress Notes (Signed)
Karen Hahn 1976-01-31 [id number]  SUBJECTIVE:  44 y.o. G79P0020 female here for annual routine gynecologic exam. She has no gynecologic concerns.   Current Outpatient Medications  Medication Sig Dispense Refill  . Calcium-Iron-Vit D-Vit K (CALCIUM SOFT CHEWS) [id number] MG-UNT-MCG CHEW Chew by mouth daily.    Marland Kitchen dicyclomine (BENTYL) 10 MG capsule Take 1 capsule by mouth four times daily before meals and at bedtime. 120 capsule 1  . Erenumab-aooe (AIMOVIG) 70 MG/ML SOAJ Inject 70 mg into the skin every 30 (thirty) days. 1 pen 11  . ferrous gluconate (FERGON) 240 (27 FE) MG tablet Take 240 mg by mouth daily after supper.    Marland Kitchen ibuprofen (ADVIL) 600 MG tablet TAKE 1 TABLET(600 MG) BY MOUTH EVERY 8 HOURS AS NEEDED 90 tablet 0  . lamoTRIgine (LAMICTAL) 150 MG tablet TAKE 2 TABLETS(300 MG) BY MOUTH DAILY 180 tablet 0  . Multiple Vitamins-Minerals (MULTIVITAMIN GUMMIES ADULT) CHEW Chew by mouth.    . naproxen (NAPROSYN) 500 MG tablet Take 1 tablet (500 mg total) by mouth 2 (two) times daily as needed. 20 tablet 3  . pantoprazole (PROTONIX) 40 MG tablet TAKE 1 TABLET BY MOUTH EVERY MORNING 90 tablet 0  . promethazine (PHENERGAN) 25 MG tablet TAKE 1 TABLET(25 MG) BY MOUTH twice daily as needed 60 tablet 2  . rizatriptan (MAXALT-MLT) 10 MG disintegrating tablet Take 1 tablet (10 mg total) by mouth as needed for migraine. May repeat in 2 hours if needed 10 tablet 11  . rosuvastatin (CRESTOR) 20 MG tablet Take 1 tablet (20 mg total) by mouth daily. 90 tablet 3  . Ubrogepant (UBRELVY) 100 MG TABS Take 1 tablet by mouth as needed (May repeat dose after 2 hours.  Maximum 2 tablets in 24 hours). 16 tablet 11  . Vitamin D, Ergocalciferol, (DRISDOL) 1.25 MG (50000 UT) CAPS capsule Take 1 capsule (50,000 Units total) by mouth every 7 (seven) days. 12 capsule 0   No current facility-administered medications for this visit.   Allergies: Bactrim [sulfamethoxazole-trimethoprim], Penicillins,  Ciprofloxacin, Morphine and related, Reglan [metoclopramide], Benadryl [diphenhydramine hcl], and Diflucan [fluconazole]  Patient's last menstrual period was 09/15/2019.  Past medical history,surgical history, problem list, medications, allergies, family history and social history were all reviewed and documented as reviewed in the EPIC chart.  ROS:  Feeling well. No dyspnea or chest pain on exertion.  No abdominal pain, change in bowel habits, black or bloody stools.  No urinary tract symptoms. GYN ROS: normal menses, no abnormal bleeding, pelvic pain or discharge, no breast pain or new or enlarging lumps on self exam. No neurological complaints.  OBJECTIVE:  BP 118/76   Ht 5\' 3"  (1.6 m)   Wt 106 lb (48.1 kg)   LMP 09/15/2019   BMI 18.78 kg/m  The patient appears well, alert, oriented x 3, in no distress. ENT normal.  Neck supple. No cervical or supraclavicular adenopathy or thyromegaly.  Lungs are clear, good air entry, no wheezes, rhonchi or rales. S1 and S2 normal, no murmurs, regular rate and rhythm.  Abdomen soft without tenderness, guarding, mass or organomegaly.  Neurological is normal, no focal findings.  BREAST EXAM: breasts appear normal, no suspicious masses, no skin or nipple changes or axillary nodes  PELVIC EXAM: VULVA: normal appearing vulva with no masses, tenderness or lesions, VAGINA: normal appearing vagina with normal color and discharge, no lesions, CERVIX: normal appearing cervix without discharge or lesions, UTERUS: uterus is normal size, shape, consistency and nontender, ADNEXA: normal adnexa in size, nontender  and no masses  Urinalysis negative.  Chaperone: Caryn Bee present during the examination  ASSESSMENT:  44 y.o. G2P0020 here for annual gynecologic exam  PLAN:   1. No menstrual or hormonal concerns.  Reports history of frequent UTIs, urinalysis today is negative. 2. Pap smear/HPV 09/2017.  Repeat at the 5-year interval in 2024. 3. Contraception. Not  using, not sexually active. 4. DEXA 02/2018 indicated normal Z-score. She is high risk and follow up was recommended in 2 years, so she can repeat this here or through her primary doctor. 5.  History of stable, probably benign fibroadenoma in right breast.  This was evaluated in 2018, 2019, and 2020 by ultrasound and mammogram.  No findings of evidence of malignancy.  Normal breast exam today.  Encouraged breast self awareness and notify us of any concerning changes.  Repeat mammogram this year when due. 6. Health maintenance.  Routine screening blood work appears to be done with her primary care doctor.  Return annually or sooner, prn.  Joseph Pierini MD  09/29/19

## 2019-09-29 NOTE — Patient Instructions (Signed)
We will plan to repeat the DEXA/bone density scan in July-August 2021.  Continue weight bearing exercise, and vitamin D/calcium intake.

## 2019-10-07 ENCOUNTER — Other Ambulatory Visit: Payer: Self-pay | Admitting: Internal Medicine

## 2019-10-31 ENCOUNTER — Other Ambulatory Visit: Payer: Self-pay | Admitting: Internal Medicine

## 2019-11-03 ENCOUNTER — Other Ambulatory Visit: Payer: Self-pay | Admitting: Internal Medicine

## 2019-11-16 DIAGNOSIS — M25531 Pain in right wrist: Secondary | ICD-10-CM | POA: Diagnosis not present

## 2019-11-24 DIAGNOSIS — M659 Synovitis and tenosynovitis, unspecified: Secondary | ICD-10-CM | POA: Diagnosis not present

## 2019-11-24 DIAGNOSIS — M19031 Primary osteoarthritis, right wrist: Secondary | ICD-10-CM | POA: Diagnosis not present

## 2019-11-24 DIAGNOSIS — S63591A Other specified sprain of right wrist, initial encounter: Secondary | ICD-10-CM | POA: Diagnosis not present

## 2019-11-24 DIAGNOSIS — M069 Rheumatoid arthritis, unspecified: Secondary | ICD-10-CM | POA: Insufficient documentation

## 2019-11-24 DIAGNOSIS — M25331 Other instability, right wrist: Secondary | ICD-10-CM | POA: Diagnosis not present

## 2019-11-24 DIAGNOSIS — I011 Acute rheumatic endocarditis: Secondary | ICD-10-CM | POA: Insufficient documentation

## 2019-11-24 DIAGNOSIS — G8918 Other acute postprocedural pain: Secondary | ICD-10-CM | POA: Diagnosis not present

## 2019-11-25 ENCOUNTER — Encounter (HOSPITAL_COMMUNITY): Payer: Self-pay | Admitting: Psychiatry

## 2019-11-25 ENCOUNTER — Telehealth (INDEPENDENT_AMBULATORY_CARE_PROVIDER_SITE_OTHER): Payer: Medicare Other | Admitting: Psychiatry

## 2019-11-25 ENCOUNTER — Other Ambulatory Visit: Payer: Self-pay

## 2019-11-25 DIAGNOSIS — F411 Generalized anxiety disorder: Secondary | ICD-10-CM | POA: Diagnosis not present

## 2019-11-25 DIAGNOSIS — F900 Attention-deficit hyperactivity disorder, predominantly inattentive type: Secondary | ICD-10-CM | POA: Diagnosis not present

## 2019-11-25 DIAGNOSIS — F3181 Bipolar II disorder: Secondary | ICD-10-CM | POA: Diagnosis not present

## 2019-11-25 DIAGNOSIS — F639 Impulse disorder, unspecified: Secondary | ICD-10-CM

## 2019-11-25 MED ORDER — LAMOTRIGINE 150 MG PO TABS: ORAL_TABLET | ORAL | 0 refills | Status: DC

## 2019-11-25 NOTE — BH Specialist Note (Signed)
Virtual Visit via Telephone Note  I connected with Karen Hahn on 11/25/19 at  1:00 PM EDT by telephone and verified that I am speaking with the correct person using two identifiers.  Location: Patient: Home Provider: Office   I discussed the limitations, risks, security and privacy concerns of performing an evaluation and management service by telephone and the availability of in person appointments. I also discussed with the patient that there may be a patient responsible charge related to this service. The patient expressed understanding and agreed to proceed.   History of Present Illness: Karen Hahn is doing well. She had wrist surgery yesterday and is in a lot of pain. Karen Hahn was given 20 tabs of oxycodone and her roommate is holding onto the medication.  She shares that her depression is well controlled.  She is denying any symptoms of mania or hypomania.  Her insomnia is not bothering her currently.  She states that she continues to experience symptoms of ADHD and GAD but says that it is manageable.    Observations/Objective:  General Appearance: unable to assess  Eye Contact:  unable to assess  Speech:  Clear and Coherent and Normal Rate  Volume:  Normal  Mood:  Euthymic  Affect:  Full Range  Thought Process:  Goal Directed, Linear and Descriptions of Associations: Intact  Orientation:  Full (Time, Place, and Person)  Thought Content:  Logical  Suicidal Thoughts:  No  Homicidal Thoughts:  No  Memory:  Immediate;   Good  Judgement:  Good  Insight:  Good  Psychomotor Activity: unable to assess  Concentration:  Concentration: Good  Recall:  Good  Fund of Knowledge:  Good  Language:  Good  Akathisia:  unable to assess  Handed:  Right  AIMS (if indicated):     Assets:  Communication Skills Desire for Improvement Financial Resources/Insurance Housing Leisure Time Resilience Social Support Talents/Skills Transportation  ADL's:  unable to assess  Cognition:  WNL   Sleep:        I reviewed the information below on November 25, 2019 and have updated Assessment and Plan: MDD- recurrent, severe w/o psychotic features vs Bipolar 2 d/o Anorexia nervosa Impulse control d/o GAD ADHD Insomnia Cannabis use d/o  Lamictal 300mg  po qD   Pt is managing symptoms of anorexia, GAD, ADHD, insomnia on her own   Follow Up Instructions: In 3 months or sooner if needed   I discussed the assessment and treatment plan with the patient. The patient was provided an opportunity to ask questions and all were answered. The patient agreed with the plan and demonstrated an understanding of the instructions.   The patient was advised to call back or seek an in-person evaluation if the symptoms worsen or if the condition fails to improve as anticipated.  I provided 15 minutes of non-face-to-face time during this encounter.   , MD

## 2019-11-30 ENCOUNTER — Other Ambulatory Visit: Payer: Self-pay | Admitting: Internal Medicine

## 2019-12-01 ENCOUNTER — Telehealth: Payer: Medicare Other | Admitting: Neurology

## 2019-12-09 DIAGNOSIS — M25531 Pain in right wrist: Secondary | ICD-10-CM | POA: Diagnosis not present

## 2019-12-09 DIAGNOSIS — Z4789 Encounter for other orthopedic aftercare: Secondary | ICD-10-CM | POA: Insufficient documentation

## 2019-12-10 NOTE — Progress Notes (Signed)
NEUROLOGY FOLLOW UP OFFICE NOTE  Hanin [place]ook [place] [id number]  History of Present Illness: Karen Hahn is a 44 year old female with ADD, generalized anxiety disorder, depression and history of concussion and substance abuse who follows up for headache.  UPDATE: Intensity:  Moderate to severe Duration:  2 hours Frequency:  Twice a week  To further evaluate sensory changes/cold intolerance, she underwent NCV-EMG on 08/11/2019, which was normal.  Current NSAIDS:none Current analgesics:Percocet  Current triptans:Maxalt MLT10mg  (rarely takes due to  Current ergotamine:none Current anti-emetic:Promethazine 25mg  Current muscle relaxants:none Current anti-anxiolytic:none Current sleep aide:none Current Antihypertensive medications:none Current Antidepressant medications:none Current Anticonvulsant medications:Lamotrigine 300mg  daily Current anti-CGRP:Aimovig 70mg  monthly; Ubrelvy Current Vitamins/Herbal/Supplements:D, ferrous gluconate Current Antihistamines/Decongestants:none Other therapy:none Hormone/birth control:none  Caffeine:1 cup of coffee daily Diet:Tries to drink water throughout day. Occasional soda. Exercise:No Depression:Not currently; Anxiety:Yes, but manageable Other pain:no Sleep hygiene:Good. 6-8 hours a night  HISTORY: No prior history of headache.No preceding trigger such as recent head trauma, new medication or exposure to new environmentIn July, she had 3 episodes where she woke up with blurred vision for 5 minutes and resolved.In August 2020, she began experiencing a dailyheadaches that soon became apersistent headache that has been gradually increasing in intensity. They are now severe, throbbing, bitemporal and frontal headaches associated with intermittent photophobia. On a few occasions, she had felt nauseous and vomited.She reports fatigue. No fever, chills,unilateral numbness or weakness.  Sometimes closing her eyes help reduce intensity.She has prior concussion in 2017 but no recent preceding head trauma. History of depression and anxiety but this has been stable.   She was prescribed rizatriptan and topiramate by her PCP. Rizatriptan helped a little bit. Due to potential anorexia, she did not start topiramate. She was told by radiology that she cannot have a brain MRI due to the interstem implant in her back.  Since around 2017, she frequently feels cold.  She reports that her body (particularly hands and feet) fell cold (internally but also to the touch).  No numbness, tingling, skin discoloration, palpitations, increased/decreased sweating, or lightheadedness.  TSH and B12 from July 2020 were 2.60 and 473 respectively.   05/19/2019 CTA of head and neck:  Normal.  Past NSAIDS:Ibuprofen (not effective) Past analgesics:tramadol Past abortive triptans:none Past abortive ergotamine:none Past muscle relaxants:Flexeril Past anti-emetic:Reglan, Zofran 8mg  Past antihypertensive medications:none Past antidepressant/antipsychoticmedications: nortriptyline (photosensitivity), bupropion, Haldol, mirtazapine, Zyprexa, sertraline 200mg  Past anticonvulsant medications:topiramate (contraindicated due to history of anorexia) Past anti-CGRP:none Past vitamins/Herbal/Supplements:none Past antihistamines/decongestants:Zyrtec Other past therapies:none   Family history of headache:Mother (migraines, stopped after CEA for bilateral ICA stenosis.   Past Medical History: Past Medical History:  Diagnosis Date  . ADD (attention deficit disorder)   . Anorexia nervosa without bulimia   . Chronic constipation   . Drug abuse ([place])   . GAD (generalized anxiety disorder)   . GERD (gastroesophageal reflux disease)   . History of gastric restrictive surgery   . History of gastric ulcer   . History of Helicobacter pylori infection    after 2003  . HLD  (hyperlipidemia) 02/16/2018  . Impulse control disorder   . Moderate major depression ([place])   . Osteopenia   . Post concussion syndrome    fell 10/20/2015  . SMAS (superior mesenteric artery syndrome) ([place])   . Urge urinary incontinence     Medications: Outpatient Encounter Medications as of 12/13/2019  Medication Sig Note  . Calcium-Iron-Vit D-Vit K (CALCIUM SOFT CHEWS) 500-08-998-40 MG-UNT-MCG CHEW Chew by mouth daily.   Marland Kitchen dicyclomine (BENTYL) 10  MG capsule Take 1 capsule by mouth four times daily before meals and at bedtime.   Dorise Hiss (AIMOVIG) 70 MG/ML SOAJ Inject 70 mg into the skin every 30 (thirty) days.   . ferrous gluconate (FERGON) 240 (27 FE) MG tablet Take 240 mg by mouth daily after supper.   Marland Kitchen ibuprofen (ADVIL) 600 MG tablet TAKE 1 TABLET(600 MG) BY MOUTH EVERY 8 HOURS AS NEEDED   . lamoTRIgine (LAMICTAL) 150 MG tablet TAKE 2 TABLETS(300 MG) BY MOUTH DAILY   . meloxicam (MOBIC) 7.5 MG tablet Take 7.5 mg by mouth daily as needed.   . Multiple Vitamins-Minerals (MULTIVITAMIN GUMMIES ADULT) CHEW Chew by mouth. 03/06/2017: Takes 2 daily.   . naproxen (NAPROSYN) 500 MG tablet Take 1 tablet (500 mg total) by mouth 2 (two) times daily as needed.   Marland Kitchen oxyCODONE-acetaminophen (PERCOCET/ROXICET) 5-325 MG tablet Take 1 tablet by mouth 4 (four) times daily as needed.   . pantoprazole (PROTONIX) 40 MG tablet TAKE 1 TABLET BY MOUTH EVERY MORNING   . promethazine (PHENERGAN) 25 MG tablet TAKE 1 TABLET(25 MG) BY MOUTH TWICE DAILY AS NEEDED   . rizatriptan (MAXALT-MLT) 10 MG disintegrating tablet Take 1 tablet (10 mg total) by mouth as needed for migraine. May repeat in 2 hours if needed 05/03/2019: Insurance not covered  . rosuvastatin (CRESTOR) 20 MG tablet Take 1 tablet (20 mg total) by mouth daily.   Marland Kitchen Ubrogepant (UBRELVY) 100 MG TABS Take 1 tablet by mouth as needed (May repeat dose after 2 hours.  Maximum 2 tablets in 24 hours).   . Vitamin D, Ergocalciferol, (DRISDOL) 1.25 MG (50000  UT) CAPS capsule Take 1 capsule (50,000 Units total) by mouth every 7 (seven) days.    No facility-administered encounter medications on file as of 12/13/2019.    Allergies: Allergies  Allergen Reactions  . Bactrim [Sulfamethoxazole-Trimethoprim] Other (See Comments)    Yeast infection  . Penicillins Other (See Comments)    Has patient had a PCN reaction causing immediate rash, facial/tongue/throat swelling, SOB or lightheadedness with hypotension: NO (UTI, YEAST INFECTION) Has patient had a PCN reaction causing severe rash involving mucus membranes or skin necrosis: NO Has patient had a PCN reaction that required hospitalization NO Has patient had a PCN reaction occurring within the last 10 years: NO If all of the above answers are "NO", then may proceed with Cephalosporin use.   . Ciprofloxacin Itching  . Morphine And Related Itching and Other (See Comments)    "whole body feels tight feeling"  . Reglan [Metoclopramide]   . Benadryl [Diphenhydramine Hcl] Palpitations  . Diflucan [Fluconazole] Rash    Family History: Family History  Problem Relation Age of Onset  . Alcohol abuse Mother   . Anxiety disorder Mother   . Depression Mother        severe with hx of suicide attempt. treated with ECT  . COPD Mother   . Cancer Mother        Cervical  . Stroke Mother   . Anxiety disorder Father   . Drug abuse Father   . COPD Father   . Alcohol abuse Maternal Uncle   . Anxiety disorder Maternal Grandmother   . Depression Maternal Grandmother   . ADD / ADHD Brother   . Breast cancer Paternal Grandmother 57  . Stomach cancer Neg Hx   . Colon cancer Neg Hx   . Rectal cancer Neg Hx   . Esophageal cancer Neg Hx     Social History: Social History  Socioeconomic History  . Marital status: Divorced    Spouse name: Not on file  . Number of children: 0  . Years of education: 71  . Highest education level: Not on file  Occupational History  . Occupation: Disability  Tobacco  Use  . Smoking status: Former Smoker    Packs/day: 0.50    Years: 1.00    Pack years: 0.50    Types: Cigarettes    Quit date: 12/03/2016    Years since quitting: 3.0  . Smokeless tobacco: Never Used  . Tobacco comment: quit smoking 8 months ago as of 10/15/18  Substance and Sexual Activity  . Alcohol use: No    Alcohol/week: 0.0 standard drinks  . Drug use: No    Frequency: 7.0 times per week    Types: Marijuana    Comment: hx of and last used cocaine 2012. she was smoking marjiuana 7g/week. smoking thru out the day but quit 4 months ago  . Sexual activity: Not Currently    Comment: 1st intercourse 44 yo-Fewer than 5 partners  Other Topics Concern  . Not on file  Social History Narrative   Fun: Play piano; Build Lego   Denies abuse and feels safe at home. Living with friend in [place].    Right handed    Drink coffee 1 cup eveyday   Leave with 2 friends   1 story home, 16 steps   Social Determinants of Health   Financial Resource Strain:   . Difficulty of Paying Living Expenses:   Food Insecurity:   . Worried About Programme researcher, broadcasting/film/video in the Last Year:   . Barista in the Last Year:   Transportation Needs:   . Freight forwarder (Medical):   Marland Kitchen Lack of Transportation (Non-Medical):   Physical Activity:   . Days of Exercise per Week:   . Minutes of Exercise per Session:   Stress:   . Feeling of Stress :   Social Connections:   . Frequency of Communication with Friends and Family:   . Frequency of Social Gatherings with Friends and Family:   . Attends Religious Services:   . Active Member of Clubs or Organizations:   . Attends Banker Meetings:   Marland Kitchen Marital Status:   Intimate Partner Violence:   . Fear of Current or Ex-Partner:   . Emotionally Abused:   Marland Kitchen Physically Abused:   . Sexually Abused:     Observations/Objective:   Blood pressure 102/66, pulse 100, height 5\' 3"  (1.6 m), weight 103 lb 9.6 oz (47 kg), SpO2 97 %. alert and  oriented to person, place, and time. Attention span and concentration intact, recent and remote memory intact, fund of knowledge intact.  Speech fluent and not dysarthric, language intact.  CN II-XII intact. Bulk and tone normal, muscle strength 5/5 throughout.  Sensation to light touch  intact.  Deep tendon reflexes 2+ throughout.  Finger to nose testing intact.  Gait normal, Romberg negative.  Assessment and Plan:   1.  Migraine without aura, without status migrainosus, not intractable 2.  Cold intolerance/persistently feels cold.  No neurologic explanation identified.  1.  For preventative management, increase Aimovig to 140mg  monthly to achieve further headache frequenc. 2.  For abortive therapy, Ubrelvy 100mg  3.  Limit use of pain relievers to no more than 2 days out of week to prevent risk of rebound or medication-overuse headache. 4.  Keep headache diary 5.  Exercise, hydration, caffeine cessation, sleep hygiene,  monitor for and avoid triggers 6. Follow up 6 months.   Cira Servant, DO

## 2019-12-13 ENCOUNTER — Other Ambulatory Visit: Payer: Self-pay

## 2019-12-13 ENCOUNTER — Ambulatory Visit (INDEPENDENT_AMBULATORY_CARE_PROVIDER_SITE_OTHER): Payer: Medicare Other | Admitting: Neurology

## 2019-12-13 ENCOUNTER — Encounter: Payer: Self-pay | Admitting: Neurology

## 2019-12-13 VITALS — BP 102/66 | HR 100 | Ht 63.0 in | Wt 103.6 lb

## 2019-12-13 DIAGNOSIS — G43009 Migraine without aura, not intractable, without status migrainosus: Secondary | ICD-10-CM | POA: Diagnosis not present

## 2019-12-13 MED ORDER — AIMOVIG 140 MG/ML ~~LOC~~ SOAJ: 140.0000 mg | SUBCUTANEOUS | 11 refills | Status: DC

## 2019-12-13 NOTE — Patient Instructions (Addendum)
1.  We will increase Aimovig to 140mg  monthly 2.  Use Ubrelvy as needed 3.  Follow up in 6 months

## 2019-12-23 DIAGNOSIS — M25531 Pain in right wrist: Secondary | ICD-10-CM | POA: Diagnosis not present

## 2019-12-28 ENCOUNTER — Telehealth (HOSPITAL_COMMUNITY): Payer: Self-pay

## 2019-12-28 NOTE — Telephone Encounter (Signed)
Patient called and stated that she needs to talk to you regarding some personal issues that are going on. She wants to know if she can talk to you on Thursday or does she need to make an appointment? Her phone number is [phone].  Please review and advise. Thank you.

## 2019-12-29 ENCOUNTER — Other Ambulatory Visit: Payer: Self-pay | Admitting: Internal Medicine

## 2019-12-29 DIAGNOSIS — M25531 Pain in right wrist: Secondary | ICD-10-CM | POA: Diagnosis not present

## 2019-12-30 NOTE — Telephone Encounter (Signed)
Have her schedule an appointment

## 2020-01-04 DIAGNOSIS — M25531 Pain in right wrist: Secondary | ICD-10-CM | POA: Diagnosis not present

## 2020-01-06 ENCOUNTER — Encounter: Payer: Self-pay | Admitting: Internal Medicine

## 2020-01-06 ENCOUNTER — Other Ambulatory Visit: Payer: Self-pay

## 2020-01-07 ENCOUNTER — Encounter: Payer: Self-pay | Admitting: Obstetrics and Gynecology

## 2020-01-07 ENCOUNTER — Ambulatory Visit (INDEPENDENT_AMBULATORY_CARE_PROVIDER_SITE_OTHER): Payer: Medicaid Other | Admitting: Obstetrics and Gynecology

## 2020-01-07 DIAGNOSIS — R232 Flushing: Secondary | ICD-10-CM

## 2020-01-07 DIAGNOSIS — R300 Dysuria: Secondary | ICD-10-CM

## 2020-01-07 DIAGNOSIS — N898 Other specified noninflammatory disorders of vagina: Secondary | ICD-10-CM | POA: Diagnosis not present

## 2020-01-07 DIAGNOSIS — R7989 Other specified abnormal findings of blood chemistry: Secondary | ICD-10-CM

## 2020-01-07 DIAGNOSIS — Z1329 Encounter for screening for other suspected endocrine disorder: Secondary | ICD-10-CM | POA: Diagnosis not present

## 2020-01-07 LAB — WET PREP FOR TRICH, YEAST, CLUE

## 2020-01-07 MED ORDER — FLUCONAZOLE 150 MG PO TABS: 150.0000 mg | ORAL_TABLET | ORAL | 0 refills | Status: AC

## 2020-01-07 NOTE — Telephone Encounter (Signed)
LVM to have patient call and make an appointment with doctor (per doctor) to discuss her issues

## 2020-01-07 NOTE — Progress Notes (Signed)
Karen Hahn [birth year] [id number]  SUBJECTIVE:  44 y.o. G35P0020 female presents for concerns of vaginal discharge with odor and some painful after urination, especially after urination, does have InterStim implant. Whitish discharge in clumps. Also having some hot flashes and requests labs. No STI concerns.  Current Outpatient Medications  Medication Sig Dispense Refill  . Calcium-Iron-Vit D-Vit K (CALCIUM SOFT CHEWS) [id number] MG-UNT-MCG CHEW Chew by mouth daily.    Marland Kitchen dicyclomine (BENTYL) 10 MG capsule Take 1 capsule by mouth four times daily before meals and at bedtime. 120 capsule 1  . Erenumab-aooe (AIMOVIG) 140 MG/ML SOAJ Inject 140 mg into the skin every 30 (thirty) days. 1 pen 11  . ferrous gluconate (FERGON) 240 (27 FE) MG tablet Take 240 mg by mouth daily after supper.    . fluconazole (DIFLUCAN) 150 MG tablet Take 1 tablet (150 mg total) by mouth every 3 (three) days for 2 doses. 2 tablet 0  . ibuprofen (ADVIL) 600 MG tablet TAKE 1 TABLET(600 MG) BY MOUTH EVERY 8 HOURS AS NEEDED 60 tablet 0  . lamoTRIgine (LAMICTAL) 150 MG tablet TAKE 2 TABLETS(300 MG) BY MOUTH DAILY 180 tablet 0  . meloxicam (MOBIC) 7.5 MG tablet Take 7.5 mg by mouth daily as needed.    . Multiple Vitamins-Minerals (MULTIVITAMIN GUMMIES ADULT) CHEW Chew by mouth.    . naproxen (NAPROSYN) 500 MG tablet Take 1 tablet (500 mg total) by mouth 2 (two) times daily as needed. 20 tablet 3  . oxyCODONE-acetaminophen (PERCOCET/ROXICET) 5-325 MG tablet Take 1 tablet by mouth 4 (four) times daily as needed.    . pantoprazole (PROTONIX) 40 MG tablet TAKE 1 TABLET BY MOUTH EVERY MORNING 90 tablet 0  . promethazine (PHENERGAN) 25 MG tablet TAKE 1 TABLET(25 MG) BY MOUTH TWICE DAILY AS NEEDED 60 tablet 2  . rizatriptan (MAXALT-MLT) 10 MG disintegrating tablet Take 1 tablet (10 mg total) by mouth as needed for migraine. May repeat in 2 hours if needed 10 tablet 11  . rosuvastatin (CRESTOR) 20 MG tablet Take 1 tablet (20 mg  total) by mouth daily. 90 tablet 3  . Ubrogepant (UBRELVY) 100 MG TABS Take 1 tablet by mouth as needed (May repeat dose after 2 hours.  Maximum 2 tablets in 24 hours). 16 tablet 11  . Vitamin D, Ergocalciferol, (DRISDOL) 1.25 MG (50000 UT) CAPS capsule Take 1 capsule (50,000 Units total) by mouth every 7 (seven) days. 12 capsule 0   No current facility-administered medications for this visit.   Allergies: Bactrim [sulfamethoxazole-trimethoprim], Penicillins, Ciprofloxacin, Morphine and related, Reglan [metoclopramide], Benadryl [diphenhydramine hcl], and Diflucan [fluconazole]  Patient's last menstrual period was 12/26/2019.  Past medical history,surgical history, problem list, medications, allergies, family history and social history were all reviewed and documented as reviewed in the EPIC chart.   OBJECTIVE:  LMP 12/26/2019  The patient appears well, alert, oriented x 3, in no distress. PELVIC EXAM: VULVA: normal appearing vulva with no masses, tenderness or lesions, VAGINA: normal appearing vagina with normal color and no lesions, copious thick white-yellow discharge present without odor, CERVIX: normal appearing cervix without discharge or lesions UA: no WBC, 0-2 RBC, few bacteria, negative nitrate and leuk est Vaginal wet prep negative for pathogens  Chaperone: Kennon Portela present during the examination  ASSESSMENT:  44 y.o. G2P0020 here for evaluation of vaginal discharge and dysuria and hot flashes.  PLAN:  Hot flashes. Likely part of perimenopause. Check FSH, TSH. History low testosterone level reported by patient. Check testosterone level. Vaginal discharge.  Negative vaginal wet prep.  Given her symptoms and no findings on the UA, will empirically treat with fluconazole 150 mg by mouth every 72 hours for 2 doses.  She has a "rash" allergy with fluconazole but has tolerated it fine in the past.  She says the rash is nothing "life-threatening" and just developed sometimes in the  fingers.  She is also allergic to Benadryl so she cannot premedicate with that.  She will let us know if her symptoms do not improve.   Joseph Pierini MD 01/07/20

## 2020-01-08 LAB — URINALYSIS, COMPLETE W/RFL CULTURE
Bilirubin Urine: NEGATIVE
Glucose, UA: NEGATIVE
Hyaline Cast: NONE SEEN /LPF
Ketones, ur: NEGATIVE
Leukocyte Esterase: NEGATIVE
Nitrites, Initial: NEGATIVE
Protein, ur: NEGATIVE
Specific Gravity, Urine: 1.02 (ref 1.001–1.03)
WBC, UA: NONE SEEN /HPF (ref 0–5)
pH: 6 (ref 5.0–8.0)

## 2020-01-08 LAB — CULTURE INDICATED

## 2020-01-08 LAB — URINE CULTURE
MICRO NUMBER:: 10554165
Result:: NO GROWTH
SPECIMEN QUALITY:: ADEQUATE

## 2020-01-10 ENCOUNTER — Encounter: Payer: Self-pay | Admitting: Obstetrics and Gynecology

## 2020-01-10 MED ORDER — DICYCLOMINE HCL 10 MG PO CAPS: ORAL_CAPSULE | ORAL | 2 refills | Status: DC

## 2020-01-10 MED ORDER — DICYCLOMINE HCL 10 MG PO CAPS: ORAL_CAPSULE | ORAL | 1 refills | Status: DC

## 2020-01-10 NOTE — Telephone Encounter (Signed)
If having vaginal symptoms try Flagyl 500 mg BID x 7 days.

## 2020-01-11 ENCOUNTER — Encounter: Payer: Self-pay | Admitting: Obstetrics and Gynecology

## 2020-01-11 LAB — FOLLICLE STIMULATING HORMONE: FSH: 4.3 m[IU]/mL

## 2020-01-11 LAB — TESTOS,TOTAL,FREE AND SHBG (FEMALE)
Free Testosterone: 0.9 pg/mL (ref 0.1–6.4)
Sex Hormone Binding: 109 nmol/L (ref 17–124)
Testosterone, Total, LC-MS-MS: 15 ng/dL (ref 2–45)

## 2020-01-11 LAB — TSH: TSH: 1.76 mIU/L

## 2020-01-11 MED ORDER — METRONIDAZOLE 500 MG PO TABS
500.0000 mg | ORAL_TABLET | Freq: Two times a day (BID) | ORAL | 0 refills | Status: DC
Start: 2020-01-11 — End: 2020-02-17

## 2020-01-12 DIAGNOSIS — M25531 Pain in right wrist: Secondary | ICD-10-CM | POA: Diagnosis not present

## 2020-01-14 DIAGNOSIS — M25531 Pain in right wrist: Secondary | ICD-10-CM | POA: Diagnosis not present

## 2020-01-18 DIAGNOSIS — M25531 Pain in right wrist: Secondary | ICD-10-CM | POA: Diagnosis not present

## 2020-01-20 DIAGNOSIS — M25531 Pain in right wrist: Secondary | ICD-10-CM | POA: Diagnosis not present

## 2020-01-21 ENCOUNTER — Other Ambulatory Visit: Payer: Self-pay | Admitting: Internal Medicine

## 2020-01-21 NOTE — Telephone Encounter (Signed)
Please refill as per office routine med refill policy (all routine meds refilled for 3 mo or monthly per pt preference up to one year from last visit, then month to month grace period for 3 mo, then further med refills will have to be denied)  

## 2020-01-22 ENCOUNTER — Other Ambulatory Visit (HOSPITAL_COMMUNITY): Payer: Self-pay | Admitting: Psychiatry

## 2020-01-22 ENCOUNTER — Other Ambulatory Visit: Payer: Self-pay | Admitting: Internal Medicine

## 2020-01-22 DIAGNOSIS — F3181 Bipolar II disorder: Secondary | ICD-10-CM

## 2020-01-22 DIAGNOSIS — F639 Impulse disorder, unspecified: Secondary | ICD-10-CM

## 2020-01-22 NOTE — Telephone Encounter (Signed)
Ibuprofen rx done but #30 only  Please let pt know we should not consider this higher dose ibuprofen as an ongoing treatment for the long term as this carries a high risk of eventual GI complications such as gastritis or ulcers with bleeding  Pt would need rov for further refills and consider other tx long term

## 2020-01-23 ENCOUNTER — Other Ambulatory Visit: Payer: Self-pay | Admitting: Internal Medicine

## 2020-01-26 DIAGNOSIS — M25531 Pain in right wrist: Secondary | ICD-10-CM | POA: Diagnosis not present

## 2020-02-02 DIAGNOSIS — M25531 Pain in right wrist: Secondary | ICD-10-CM | POA: Diagnosis not present

## 2020-02-03 DIAGNOSIS — M25531 Pain in right wrist: Secondary | ICD-10-CM | POA: Diagnosis not present

## 2020-02-09 DIAGNOSIS — M25531 Pain in right wrist: Secondary | ICD-10-CM | POA: Diagnosis not present

## 2020-02-11 DIAGNOSIS — M25531 Pain in right wrist: Secondary | ICD-10-CM | POA: Diagnosis not present

## 2020-02-15 DIAGNOSIS — M25531 Pain in right wrist: Secondary | ICD-10-CM | POA: Diagnosis not present

## 2020-02-16 ENCOUNTER — Telehealth: Payer: Self-pay | Admitting: Neurology

## 2020-02-16 NOTE — Telephone Encounter (Signed)
Spoke with pt who sent MyChart message earlier. Says Aimovig not working, has been having daily migraine for ~ 2 weeks, taking Ubrelvy daily. Told her that rebound headache with use of pain medication >2x/week is a potential problem she has been informed of and this might be the cycle she has fallen in to, she verbalized understanding.  Also, pt reporting a "delay of movement" for awhile. She sometimes goes to pick something up and her brain says to do it but her hand won't immediately do it. Is wanting to know what to do about this, if there's any testing to be done?  Told her Dr Everlena Cooper will be notified of concerns and will call with update, she verbalized understanding.

## 2020-02-16 NOTE — Telephone Encounter (Signed)
Patient sent in a [facility] message earlier today. She states that she is having migraines daily. She states that the Aimovig was helping but now it is not working  Community education officer will only pay for [employer] a month.   Delay of movements in hands and she would like to speak to someone please read the my chart message she explained everything in there she said

## 2020-02-17 ENCOUNTER — Encounter (HOSPITAL_COMMUNITY): Payer: Self-pay | Admitting: Psychiatry

## 2020-02-17 ENCOUNTER — Telehealth (HOSPITAL_COMMUNITY): Payer: Medicare Other | Admitting: Psychiatry

## 2020-02-17 ENCOUNTER — Other Ambulatory Visit: Payer: Self-pay

## 2020-02-17 DIAGNOSIS — M25531 Pain in right wrist: Secondary | ICD-10-CM | POA: Diagnosis not present

## 2020-02-17 DIAGNOSIS — F411 Generalized anxiety disorder: Secondary | ICD-10-CM

## 2020-02-17 DIAGNOSIS — G4700 Insomnia, unspecified: Secondary | ICD-10-CM

## 2020-02-17 DIAGNOSIS — F5000 Anorexia nervosa, unspecified: Secondary | ICD-10-CM

## 2020-02-17 DIAGNOSIS — F3181 Bipolar II disorder: Secondary | ICD-10-CM

## 2020-02-17 DIAGNOSIS — F639 Impulse disorder, unspecified: Secondary | ICD-10-CM

## 2020-02-17 MED ORDER — LAMOTRIGINE 150 MG PO TABS: 300.0000 mg | ORAL_TABLET | Freq: Every day | ORAL | 0 refills | Status: DC

## 2020-02-17 NOTE — Progress Notes (Signed)
Virtual Visit via Telephone Note  I connected with Karen Hahn on 02/17/20 at  4:00 PM EDT by telephone and verified that I am speaking with the correct person using two identifiers.  Location: Patient: home Provider: office   I discussed the limitations, risks, security and privacy concerns of performing an evaluation and management service by telephone and the availability of in person appointments. I also discussed with the patient that there may be a patient responsible charge related to this service. The patient expressed understanding and agreed to proceed.   History of Present Illness: Karen Hahn states she is doing well. Karen Hahn cut her mom off a couple of months ago after Karen Hahn caught her mom lying. Although she misses her mom Karen Hahn knows it is for the best. Her depression and anxiety are unchanged. Despite eating 3 decent size meals Karen Hahn lost some weight. She has added Boost twice a day to her regular meals so she can put on weight again. She denies manic and hypomanic like symptoms. Karen Hahn is sleeping decently. She denies any impulsive or angry outbursts.    Observations/Objective:  General Appearance: unable to assess  Eye Contact:  unable to assess  Speech:  Clear and Coherent and Normal Rate  Volume:  Normal  Mood:  Anxious and Depressed  Affect:  Full Range  Thought Process:  Goal Directed, Linear, and Descriptions of Associations: Intact  Orientation:  Full (Time, Place, and Person)  Thought Content:  Logical  Suicidal Thoughts:  No  Homicidal Thoughts:  No  Memory:  Immediate;   Good  Judgement:  Good  Insight:  Good  Psychomotor Activity: unable to assess  Concentration:  Concentration: Good  Recall:  Good  Fund of Knowledge:  Good  Language:  Good  Akathisia:  unable to assess  Handed:  Right  AIMS (if indicated):     Assets:  Communication Skills Desire for Improvement Financial Resources/Insurance Housing Leisure Time Resilience Social  Support Talents/Skills Transportation Vocational/Educational  ADL's:  unable to assess  Cognition:  WNL  Sleep:         Assessment and Plan: MDD- recurrent, severe without psychotic features vs Bipolar 2 Anorexia nervosa Impulse control disorder GAD ADHD Insomnia Cannabis use disorder  Continue Lamictal 300mg  po qD   Follow Up Instructions: In 3 months or sooner if needed   I discussed the assessment and treatment plan with the patient. The patient was provided an opportunity to ask questions and all were answered. The patient agreed with the plan and demonstrated an understanding of the instructions.   The patient was advised to call back or seek an in-person evaluation if the symptoms worsen or if the condition fails to improve as anticipated.  I provided 15 minutes of non-face-to-face time during this encounter.   , MD

## 2020-02-17 NOTE — Telephone Encounter (Signed)
When we make a change in medications like Aimovig, I tell patients that I like to give it at least 4 months because subsequent dosing has demonstrated to be more effective than initial dosing.  First, she needs to figure out what could be causing the headaches (increased stress?  Weather?)  It may soon pass.  I would rather still continue with Aimovig 140mg  for another 2 months, but if she insists on changing then we can add a second agent such as venlafaxine.   I am not sure what to make of this "delay of movement"  CT head has not demonstrated any abnormalities and she is unable to get an MRI of the brain.

## 2020-02-17 NOTE — Telephone Encounter (Signed)
Spoke with pt and updated on Dr Moises Blood recommendation to try and identify headache triggers and modify as possible. He also recommends giving increased  Aimovig dose 4 months to see if effective, she verbalized understanding and will give it more time.  As far as movement delay, he says that CT doesn't show anything that would suggest a cause. She states that she is seeing her PC soon and will discuss with him. She understands to contact for any further questions.

## 2020-02-17 NOTE — Telephone Encounter (Signed)
Left VM to return call 

## 2020-02-21 ENCOUNTER — Ambulatory Visit (INDEPENDENT_AMBULATORY_CARE_PROVIDER_SITE_OTHER): Payer: Medicare Other | Admitting: Internal Medicine

## 2020-02-21 ENCOUNTER — Ambulatory Visit (INDEPENDENT_AMBULATORY_CARE_PROVIDER_SITE_OTHER)
Admission: RE | Admit: 2020-02-21 | Discharge: 2020-02-21 | Disposition: A | Discharge status: Home / Self Care | Payer: Medicare Other | Source: Ambulatory Visit | Attending: Internal Medicine | Admitting: Internal Medicine

## 2020-02-21 ENCOUNTER — Other Ambulatory Visit: Payer: Self-pay

## 2020-02-21 ENCOUNTER — Encounter: Payer: Self-pay | Admitting: Internal Medicine

## 2020-02-21 ENCOUNTER — Other Ambulatory Visit (INDEPENDENT_AMBULATORY_CARE_PROVIDER_SITE_OTHER): Payer: Medicare Other

## 2020-02-21 VITALS — BP 110/60 | HR 96 | Temp 98.4°F | Ht 63.0 in | Wt 102.0 lb

## 2020-02-21 DIAGNOSIS — E559 Vitamin D deficiency, unspecified: Secondary | ICD-10-CM

## 2020-02-21 DIAGNOSIS — Z0001 Encounter for general adult medical examination with abnormal findings: Secondary | ICD-10-CM | POA: Diagnosis not present

## 2020-02-21 DIAGNOSIS — R634 Abnormal weight loss: Secondary | ICD-10-CM | POA: Diagnosis not present

## 2020-02-21 DIAGNOSIS — Z0000 Encounter for general adult medical examination without abnormal findings: Secondary | ICD-10-CM

## 2020-02-21 DIAGNOSIS — Z1159 Encounter for screening for other viral diseases: Secondary | ICD-10-CM

## 2020-02-21 DIAGNOSIS — E785 Hyperlipidemia, unspecified: Secondary | ICD-10-CM | POA: Diagnosis not present

## 2020-02-21 DIAGNOSIS — R739 Hyperglycemia, unspecified: Secondary | ICD-10-CM

## 2020-02-21 LAB — CBC WITH DIFFERENTIAL/PLATELET
Basophils Absolute: 0.1 K/uL (ref 0.0–0.1)
Basophils Relative: 1 % (ref 0.0–3.0)
Eosinophils Absolute: 0.1 K/uL (ref 0.0–0.7)
Eosinophils Relative: 1.1 % (ref 0.0–5.0)
HCT: 35.7 % — ABNORMAL LOW (ref 36.0–46.0)
Hemoglobin: 12.2 g/dL (ref 12.0–15.0)
Lymphocytes Relative: 39.4 % (ref 12.0–46.0)
Lymphs Abs: 2.5 K/uL (ref 0.7–4.0)
MCHC: 34.1 g/dL (ref 30.0–36.0)
MCV: 88.9 fl (ref 78.0–100.0)
Monocytes Absolute: 0.4 K/uL (ref 0.1–1.0)
Monocytes Relative: 5.6 % (ref 3.0–12.0)
Neutro Abs: 3.4 K/uL (ref 1.4–7.7)
Neutrophils Relative %: 52.9 % (ref 43.0–77.0)
Platelets: 257 K/uL (ref 150.0–400.0)
RBC: 4.02 Mil/uL (ref 3.87–5.11)
RDW: 12.3 % (ref 11.5–15.5)
WBC: 6.4 K/uL (ref 4.0–10.5)

## 2020-02-21 MED ORDER — IBUPROFEN 600 MG PO TABS: ORAL_TABLET | ORAL | 3 refills | Status: DC

## 2020-02-21 NOTE — Assessment & Plan Note (Signed)

## 2020-02-21 NOTE — Patient Instructions (Signed)
Please continue all other medications as before, and refills have been done if requested.  Please have the pharmacy call with any other refills you may need.  Please continue your efforts at being more active, low cholesterol diet, and weight control.  You are otherwise up to date with prevention measures today.  Please keep your appointments with your specialists as you may have planned  Please go to the XRAY Department in the first floor for the x-ray testing - at the [facility] office (basement)  Please go to the LAB at the blood drawing area for the tests to be done - at the [facility] lab (basement)  Please make an Appointment to return for your 1 year visit, or sooner if needed

## 2020-02-21 NOTE — Assessment & Plan Note (Signed)
For replacement, f/u lab 

## 2020-02-21 NOTE — Assessment & Plan Note (Signed)
For f/u lipids on new statin,  to f/u any worsening symptoms or concerns

## 2020-02-21 NOTE — Progress Notes (Signed)
Subjective:    Patient ID: Karen Hahn, female    DOB: [birth year], 44 y.o.   MRN: [id number]  HPI  Here for wellness and f/u;  Overall doing ok;  Pt denies Chest pain, worsening SOB, DOE, wheezing, orthopnea, PND, worsening LE edema, palpitations, dizziness or syncope.  Pt denies neurological change such as new headache, facial or extremity weakness.  Pt denies polydipsia, polyuria, or low sugar symptoms. Pt states overall good compliance with treatment and medications, good tolerability, and has been trying to follow appropriate diet.  Pt denies worsening depressive symptoms, suicidal ideation or panic. No fever, night sweats, wt loss, loss of appetite, or other constitutional symptoms.  Pt states good ability with ADL's, has low fall risk, home safety reviewed and adequate, no other significant changes in hearing or vision, and only occasionally active with exercise. No new complaints Wt Readings from Last 3 Encounters:  02/21/20 102 lb (46.3 kg)  12/13/19 103 lb 9.6 oz (47 kg)  09/29/19 106 lb (48.1 kg)   Past Medical History:  Diagnosis Date  . ADD (attention deficit disorder)   . Anorexia nervosa without bulimia   . Chronic constipation   . Drug abuse (HCC)   . GAD (generalized anxiety disorder)   . GERD (gastroesophageal reflux disease)   . History of gastric restrictive surgery   . History of gastric ulcer   . History of Helicobacter pylori infection    after 2003  . HLD (hyperlipidemia) 02/16/2018  . Impulse control disorder   . Moderate major depression (HCC)   . Osteopenia   . Post concussion syndrome    fell 10/20/2015  . SMAS (superior mesenteric artery syndrome) (HCC)   . Urge urinary incontinence    Past Surgical History:  Procedure Laterality Date  . DILATION AND CURETTAGE OF UTERUS  2005  . INTERSTIM IMPLANT PLACEMENT N/A 10/18/2015   Procedure: [name] IMPLANT FIRST STAGE;  Surgeon: Jerilee Field, MD;  Location: [facility];  Service:  Urology;  Laterality: N/A;  . INTERSTIM IMPLANT PLACEMENT N/A 10/18/2015   Procedure: [name] IMPLANT SECOND STAGE;  Surgeon: Jerilee Field, MD;  Location: [facility];  Service: Urology;  Laterality: N/A;  . LAPAROSCOPIC GASTRIC RESTRICTIVE DUODENAL PROCEDURE (DUODENAL SWITCH)  12/ 2003  . ORIF RIGHT WRIST FX  2013   REMOVAL HARDWARE X2 in  2013--  No retained hardware    reports that she quit smoking about 3 years ago. Her smoking use included cigarettes. She has a 0.50 pack-year smoking history. She has never used smokeless tobacco. She reports that she does not drink alcohol and does not use drugs. family history includes ADD / ADHD in her brother; Alcohol abuse in her maternal uncle and mother; Anxiety disorder in her father, maternal grandmother, and mother; Breast cancer (age of onset: 13) in her paternal grandmother; COPD in her father and mother; Cancer in her mother; Depression in her maternal grandmother and mother; Drug abuse in her father; Stroke in her mother. Allergies  Allergen Reactions  . Bactrim [Sulfamethoxazole-Trimethoprim] Other (See Comments)    Yeast infection  . Penicillins Other (See Comments)    Has patient had a PCN reaction causing immediate rash, facial/tongue/throat swelling, SOB or lightheadedness with hypotension: NO (UTI, YEAST INFECTION) Has patient had a PCN reaction causing severe rash involving mucus membranes or skin necrosis: NO Has patient had a PCN reaction that required hospitalization NO Has patient had a PCN reaction occurring within the last 10 years: NO If all  of the above answers are "NO", then may proceed with Cephalosporin use.   . Ciprofloxacin Itching  . Morphine And Related Itching and Other (See Comments)    "whole body feels tight feeling"  . Reglan [Metoclopramide]   . Benadryl [Diphenhydramine Hcl] Palpitations  . Diflucan [Fluconazole] Rash   Current Outpatient Medications on File Prior to Visit  Medication Sig  Dispense Refill  . Calcium-Iron-Vit D-Vit K (CALCIUM SOFT CHEWS) [id number] MG-UNT-MCG CHEW Chew by mouth daily.    . celecoxib (CELEBREX) 200 MG capsule celecoxib 200 mg capsule  TAKE 1 CAPSULE BY MOUTH TWICE DAILY    . dicyclomine (BENTYL) 10 MG capsule Take 1 capsule by mouth four times daily before meals and at bedtime. 360 capsule 1  . Erenumab-aooe (AIMOVIG) 140 MG/ML SOAJ Inject 140 mg into the skin every 30 (thirty) days. 1 pen 11  . ferrous gluconate (FERGON) 240 (27 FE) MG tablet Take 240 mg by mouth daily after supper.    . lamoTRIgine (LAMICTAL) 150 MG tablet Take 2 tablets (300 mg total) by mouth daily. 180 tablet 0  . Multiple Vitamins-Minerals (MULTIVITAMIN GUMMIES ADULT) CHEW Chew by mouth.    . naproxen (NAPROSYN) 500 MG tablet Take 1 tablet (500 mg total) by mouth 2 (two) times daily as needed. 20 tablet 3  . oxyCODONE-acetaminophen (PERCOCET/ROXICET) 5-325 MG tablet Take 1 tablet by mouth every 6 (six) hours.    . pantoprazole (PROTONIX) 40 MG tablet TAKE 1 TABLET BY MOUTH EVERY MORNING 90 tablet 0  . promethazine (PHENERGAN) 25 MG tablet TAKE 1 TABLET(25 MG) BY MOUTH TWICE DAILY AS NEEDED 60 tablet 2  . rosuvastatin (CRESTOR) 20 MG tablet TAKE 1 TABLET(20 MG) BY MOUTH DAILY 90 tablet 3  . Ubrogepant (UBRELVY) 100 MG TABS Take 1 tablet by mouth as needed (May repeat dose after 2 hours.  Maximum 2 tablets in 24 hours). 16 tablet 11   No current facility-administered medications on file prior to visit.   Review of Systems All otherwise neg per pt     Objective:   Physical Exam BP 110/60 (BP Location: Left Arm, Patient Position: Sitting, Cuff Size: Large)   Pulse 96   Temp 98.4 F (36.9 C) (Oral)   Ht 5\' 3"  (1.6 m)   Wt 102 lb (46.3 kg)   LMP 02/16/2020   SpO2 98%   BMI 18.07 kg/m  VS noted,  Constitutional: Pt appears in NAD HENT: Head: NCAT.  Right Ear: External ear normal.  Left Ear: External ear normal.  Eyes: . Pupils are equal, round, and reactive to  light. Conjunctivae and EOM are normal Nose: without d/c or deformity Neck: Neck supple. Gross normal ROM Cardiovascular: Normal rate and regular rhythm.   Pulmonary/Chest: Effort normal and breath sounds without rales or wheezing.  Abd:  Soft, NT, ND, + BS, no organomegaly Neurological: Pt is alert. At baseline orientation, motor grossly intact Skin: Skin is warm. No rashes, other new lesions, no LE edema Psychiatric: Pt behavior is normal without agitation  All otherwise neg per pt Lab Results  Component Value Date   WBC 6.9 02/18/2019   HGB 12.4 02/18/2019   HCT 37.9 02/18/2019   PLT 267.0 02/18/2019   GLUCOSE 76 02/18/2019   CHOL 196 07/29/2019   TRIG 54.0 07/29/2019   HDL 60.00 07/29/2019   LDLCALC 125 (H) 07/29/2019   ALT 18 07/29/2019   AST 20 07/29/2019   NA 138 02/18/2019   K 4.8 02/18/2019   CL 103 02/18/2019  CREATININE 0.84 02/18/2019   BUN 12 02/18/2019   CO2 27 02/18/2019   TSH 1.76 01/07/2020   HGBA1C 5.1 02/18/2019          Assessment & Plan:

## 2020-02-21 NOTE — Assessment & Plan Note (Signed)
stable overall by history and exam, recent data reviewed with pt, and pt to continue medical treatment as before,  to f/u any worsening symptoms or concerns  

## 2020-02-21 NOTE — Assessment & Plan Note (Addendum)
Also for cxr, etiology unclear  I spent 31 minutes in addition to time for CPX wellness examination in preparing to see the patient by review of recent labs, imaging and procedures, obtaining and reviewing separately obtained history, communicating with the patient and family or caregiver, ordering medications, tests or procedures, and documenting clinical information in the EHR including the differential Dx, treatment, and any further evaluation and other management of wt loss, vit d deficiency, hyperglycemia, hld

## 2020-02-22 DIAGNOSIS — M25531 Pain in right wrist: Secondary | ICD-10-CM | POA: Diagnosis not present

## 2020-02-22 LAB — HEPATITIS C ANTIBODY
Hepatitis C Ab: NONREACTIVE
SIGNAL TO CUT-OFF: 0 (ref <1.00)

## 2020-02-22 LAB — URINALYSIS, ROUTINE W REFLEX MICROSCOPIC
Bilirubin Urine: NEGATIVE
Hgb urine dipstick: NEGATIVE
Ketones, ur: NEGATIVE
Leukocytes,Ua: NEGATIVE
Nitrite: NEGATIVE
RBC / HPF: NONE SEEN (ref 0–?)
Specific Gravity, Urine: 1.02 (ref 1.000–1.030)
Total Protein, Urine: NEGATIVE
Urine Glucose: NEGATIVE
Urobilinogen, UA: 0.2 (ref 0.0–1.0)
pH: 6 (ref 5.0–8.0)

## 2020-02-22 LAB — LIPID PANEL
Cholesterol: 180 mg/dL (ref 0–200)
HDL: 56.2 mg/dL (ref >39.00)
LDL Cholesterol: 105 mg/dL — ABNORMAL HIGH (ref 0–99)
NonHDL: 123.92
Total CHOL/HDL Ratio: 3
Triglycerides: 93 mg/dL (ref 0.0–149.0)
VLDL: 18.6 mg/dL (ref 0.0–40.0)

## 2020-02-22 LAB — HEPATIC FUNCTION PANEL
ALT: 42 U/L — ABNORMAL HIGH (ref 0–35)
AST: 33 U/L (ref 0–37)
Albumin: 4.4 g/dL (ref 3.5–5.2)
Alkaline Phosphatase: 64 U/L (ref 39–117)
Bilirubin, Direct: 0.1 mg/dL (ref 0.0–0.3)
Total Bilirubin: 0.4 mg/dL (ref 0.2–1.2)
Total Protein: 6.9 g/dL (ref 6.0–8.3)

## 2020-02-22 LAB — BASIC METABOLIC PANEL
BUN: 10 mg/dL (ref 6–23)
CO2: 30 mEq/L (ref 19–32)
Calcium: 9.4 mg/dL (ref 8.4–10.5)
Chloride: 105 mEq/L (ref 96–112)
Creatinine, Ser: 0.73 mg/dL (ref 0.40–1.20)
GFR: 87.61 mL/min (ref >60.00)
Glucose, Bld: 63 mg/dL — ABNORMAL LOW (ref 70–99)
Potassium: 4.1 mEq/L (ref 3.5–5.1)
Sodium: 139 mEq/L (ref 135–145)

## 2020-02-22 LAB — VITAMIN D 25 HYDROXY (VIT D DEFICIENCY, FRACTURES): VITD: 21.24 ng/mL — ABNORMAL LOW (ref 30.00–100.00)

## 2020-02-23 ENCOUNTER — Encounter: Payer: Self-pay | Admitting: Internal Medicine

## 2020-02-24 DIAGNOSIS — M25531 Pain in right wrist: Secondary | ICD-10-CM | POA: Diagnosis not present

## 2020-02-28 DIAGNOSIS — M25531 Pain in right wrist: Secondary | ICD-10-CM | POA: Diagnosis not present

## 2020-02-29 ENCOUNTER — Other Ambulatory Visit: Payer: Self-pay

## 2020-02-29 ENCOUNTER — Other Ambulatory Visit: Payer: Self-pay | Admitting: Obstetrics and Gynecology

## 2020-02-29 ENCOUNTER — Ambulatory Visit (INDEPENDENT_AMBULATORY_CARE_PROVIDER_SITE_OTHER): Payer: Medicare Other

## 2020-02-29 DIAGNOSIS — M8589 Other specified disorders of bone density and structure, multiple sites: Secondary | ICD-10-CM

## 2020-02-29 DIAGNOSIS — Z780 Asymptomatic menopausal state: Secondary | ICD-10-CM

## 2020-02-29 DIAGNOSIS — Z01419 Encounter for gynecological examination (general) (routine) without abnormal findings: Secondary | ICD-10-CM

## 2020-02-29 DIAGNOSIS — Z13820 Encounter for screening for osteoporosis: Secondary | ICD-10-CM | POA: Diagnosis not present

## 2020-03-06 ENCOUNTER — Encounter: Payer: Self-pay | Admitting: Obstetrics and Gynecology

## 2020-03-08 DIAGNOSIS — M25531 Pain in right wrist: Secondary | ICD-10-CM | POA: Diagnosis not present

## 2020-03-11 ENCOUNTER — Encounter: Payer: Self-pay | Admitting: Obstetrics and Gynecology

## 2020-03-15 NOTE — Telephone Encounter (Signed)
Did they really tell her not to walk for exercise?  Anything involving weightbearing is going to be considered physical activity.

## 2020-03-16 NOTE — Telephone Encounter (Signed)
She would need to make an appointment since this is a new issue that I never previously discussed with her.

## 2020-03-27 ENCOUNTER — Other Ambulatory Visit: Payer: Self-pay | Admitting: Internal Medicine

## 2020-03-27 DIAGNOSIS — Z1231 Encounter for screening mammogram for malignant neoplasm of breast: Secondary | ICD-10-CM

## 2020-04-01 ENCOUNTER — Other Ambulatory Visit: Payer: Self-pay | Admitting: Internal Medicine

## 2020-04-04 ENCOUNTER — Ambulatory Visit: Payer: Medicare Other

## 2020-04-08 ENCOUNTER — Other Ambulatory Visit: Payer: Self-pay | Admitting: Internal Medicine

## 2020-04-12 ENCOUNTER — Other Ambulatory Visit: Payer: Self-pay

## 2020-04-12 ENCOUNTER — Ambulatory Visit
Admission: RE | Admit: 2020-04-12 | Discharge: 2020-04-12 | Disposition: A | Discharge status: Home / Self Care | Payer: Medicare Other | Source: Ambulatory Visit | Attending: Internal Medicine | Admitting: Internal Medicine

## 2020-04-12 DIAGNOSIS — Z1231 Encounter for screening mammogram for malignant neoplasm of breast: Secondary | ICD-10-CM | POA: Diagnosis not present

## 2020-04-13 DIAGNOSIS — M25531 Pain in right wrist: Secondary | ICD-10-CM | POA: Diagnosis not present

## 2020-04-16 ENCOUNTER — Other Ambulatory Visit: Payer: Self-pay | Admitting: Internal Medicine

## 2020-04-18 ENCOUNTER — Encounter: Payer: Self-pay | Admitting: Internal Medicine

## 2020-04-18 ENCOUNTER — Other Ambulatory Visit: Payer: Self-pay | Admitting: Internal Medicine

## 2020-04-18 DIAGNOSIS — R5383 Other fatigue: Secondary | ICD-10-CM

## 2020-04-18 NOTE — Progress Notes (Signed)
NEUROLOGY FOLLOW UP OFFICE NOTE  Karen Hahn [id number]  HISTORY OF PRESENT ILLNESS: Karen Hahn is a 44 year old female with ADD, generalized anxiety disorder, depression and history of concussion and substance abuse whom I see for migraines presents for new issue, "delay of movement".  UPDATE: For 6 to 12 months, she notices a "delay of movement".  If she wants to tie a trash bag or buckle her seatbelt and she can't do it.  She needs to shake her hands out and it may take up to 5 minutes.  She reports dropping objects.  She also reports difficulty speaking.  When she speaks, it sometimes comes out jumbled.  She also reports long-term and short-term memory problems.  Increased Aimovig in May.  Migraines improved. Intensity:  Moderate to severe Duration:  2 hours Frequency:  once a week. Current NSAIDS:none Current analgesics:Percocet  Current triptans:Maxalt MLT10mg  (rarely takes) Current ergotamine:none Current anti-emetic:Promethazine 25mg  Current muscle relaxants:none Current anti-anxiolytic:none Current sleep aide:none Current Antihypertensive medications:none Current Antidepressant medications:none Current Anticonvulsant medications:Lamotrigine 300mg  daily Current anti-CGRP:Aimovig 140mg  monthly; Ubrelvy Current Vitamins/Herbal/Supplements:D, ferrous gluconate Current Antihistamines/Decongestants:none Other therapy:none Hormone/birth control:none  Caffeine:1 cup of coffee daily Diet:Tries to drink water throughout day. Occasional soda. Exercise:No Depression:Not currently; Anxiety:Yes, but manageable Other pain:no Sleep hygiene:Good. 6-8 hours a night  HISTORY: No prior history of headache.No preceding trigger such as recent head trauma, new medication or exposure to new environmentIn July, she had 3 episodes where she woke up with blurred vision for 5 minutes and resolved.In August 2020, she began  experiencing a dailyheadaches that soon became apersistent headache that has been gradually increasing in intensity. They are now severe, throbbing, bitemporal and frontal headaches associated with intermittent photophobia. On a few occasions, she had felt nauseous and vomited.She reports fatigue. No fever, chills,unilateral numbness or weakness. Sometimes closing her eyes help reduce intensity.She has prior concussion in 2017 but no recent preceding head trauma. History of depression and anxiety but this has been stable.   She was prescribed rizatriptan and topiramate by her PCP. Rizatriptan helped a little bit. Due to potential anorexia, she did not start topiramate. She was told by radiology that she cannot have a brain MRI due to the interstem implant in her back.  Since around 2017, she frequently feels cold. She reports that her body (particularly hands and feet) fell cold (internally but also to the touch).No numbness, tingling, skin discoloration, palpitations, increased/decreased sweating, or lightheadedness.TSH and B12 from July 2020 were 2.60 and 473 respectively.  To further evaluate sensory changes/cold intolerance, she underwent NCV-EMG on 08/11/2019, which was normal.   05/19/2019 CTA of head and neck: Normal.  Past NSAIDS:Ibuprofen (not effective) Past analgesics:tramadol Past abortive triptans:none Past abortive ergotamine:none Past muscle relaxants:Flexeril Past anti-emetic:Reglan, Zofran 8mg  Past antihypertensive medications:none Past antidepressant/antipsychoticmedications:nortriptyline (photosensitivity), bupropion, Haldol, mirtazapine, Zyprexa, sertraline 200mg  Past anticonvulsant medications:topiramate (contraindicated due to history of anorexia) Past anti-CGRP:none Past vitamins/Herbal/Supplements:none Past antihistamines/decongestants:Zyrtec Other past therapies:none   Family history of headache:Mother (migraines,  stopped after CEA for bilateral ICA stenosis.  PAST MEDICAL HISTORY: Past Medical History:  Diagnosis Date  . ADD (attention deficit disorder)   . Anorexia nervosa without bulimia   . Chronic constipation   . Drug abuse (HCC)   . GAD (generalized anxiety disorder)   . GERD (gastroesophageal reflux disease)   . History of gastric restrictive surgery   . History of gastric ulcer   . History of Helicobacter pylori infection    after 2003  . HLD (hyperlipidemia) 02/16/2018  .  Impulse control disorder   . Moderate major depression (HCC)   . Osteopenia   . Post concussion syndrome    fell 10/20/2015  . SMAS (superior mesenteric artery syndrome) (HCC)   . Urge urinary incontinence     MEDICATIONS: Current Outpatient Medications on File Prior to Visit  Medication Sig Dispense Refill  . Calcium-Iron-Vit D-Vit K (CALCIUM SOFT CHEWS) [id number] MG-UNT-MCG CHEW Chew by mouth daily.    . celecoxib (CELEBREX) 200 MG capsule celecoxib 200 mg capsule  TAKE 1 CAPSULE BY MOUTH TWICE DAILY    . dicyclomine (BENTYL) 10 MG capsule TAKE 1 CAPSULE BY MOUTH FOUR TIMES DAILY BEFORE MEALS AND AT BEDTIME 120 capsule 5  . Erenumab-aooe (AIMOVIG) 140 MG/ML SOAJ Inject 140 mg into the skin every 30 (thirty) days. 1 pen 11  . ferrous gluconate (FERGON) 240 (27 FE) MG tablet Take 240 mg by mouth daily after supper.    Marland Kitchen ibuprofen (ADVIL) 600 MG tablet TAKE 1 TABLET(600 MG) BY MOUTH EVERY 6 HOURS AS NEEDED 120 tablet 3  . lamoTRIgine (LAMICTAL) 150 MG tablet Take 2 tablets (300 mg total) by mouth daily. 180 tablet 0  . Multiple Vitamins-Minerals (MULTIVITAMIN GUMMIES ADULT) CHEW Chew by mouth.    . naproxen (NAPROSYN) 500 MG tablet Take 1 tablet (500 mg total) by mouth 2 (two) times daily as needed. 20 tablet 3  . nystatin (MYCOSTATIN) 100000 UNIT/ML suspension TAKE 5 MLS BY MOUTH FOUR TIMES DAILY X 10 DAYS 60 mL 0  . oxyCODONE-acetaminophen (PERCOCET/ROXICET) 5-325 MG tablet Take 1 tablet by mouth every 6  (six) hours.    . pantoprazole (PROTONIX) 40 MG tablet TAKE 1 TABLET BY MOUTH EVERY MORNING 90 tablet 0  . promethazine (PHENERGAN) 25 MG tablet TAKE 1 TABLET(25 MG) BY MOUTH TWICE DAILY AS NEEDED 60 tablet 2  . rosuvastatin (CRESTOR) 20 MG tablet TAKE 1 TABLET(20 MG) BY MOUTH DAILY 90 tablet 3  . Ubrogepant (UBRELVY) 100 MG TABS Take 1 tablet by mouth as needed (May repeat dose after 2 hours.  Maximum 2 tablets in 24 hours). 16 tablet 11   No current facility-administered medications on file prior to visit.    ALLERGIES: Allergies  Allergen Reactions  . Bactrim [Sulfamethoxazole-Trimethoprim] Other (See Comments)    Yeast infection  . Penicillins Other (See Comments)    Has patient had a PCN reaction causing immediate rash, facial/tongue/throat swelling, SOB or lightheadedness with hypotension: NO (UTI, YEAST INFECTION) Has patient had a PCN reaction causing severe rash involving mucus membranes or skin necrosis: NO Has patient had a PCN reaction that required hospitalization NO Has patient had a PCN reaction occurring within the last 10 years: NO If all of the above answers are "NO", then may proceed with Cephalosporin use.   . Ciprofloxacin Itching  . Morphine And Related Itching and Other (See Comments)    "whole body feels tight feeling"  . Reglan [Metoclopramide]   . Benadryl [Diphenhydramine Hcl] Palpitations  . Diflucan [Fluconazole] Rash    FAMILY HISTORY: Family History  Problem Relation Age of Onset  . Alcohol abuse Mother   . Anxiety disorder Mother   . Depression Mother        severe with hx of suicide attempt. treated with ECT  . COPD Mother   . Cancer Mother        Cervical  . Stroke Mother   . Anxiety disorder Father   . Drug abuse Father   . COPD Father   . Alcohol abuse Maternal  Uncle   . Anxiety disorder Maternal Grandmother   . Depression Maternal Grandmother   . ADD / ADHD Brother   . Breast cancer Paternal Grandmother 26  . Stomach cancer Neg Hx     . Colon cancer Neg Hx   . Rectal cancer Neg Hx   . Esophageal cancer Neg Hx    SOCIAL HISTORY: Social History   Socioeconomic History  . Marital status: Divorced    Spouse name: Not on file  . Number of children: 0  . Years of education: 71  . Highest education level: Not on file  Occupational History  . Occupation: Disability  Tobacco Use  . Smoking status: Former Smoker    Packs/day: 0.50    Years: 1.00    Pack years: 0.50    Types: Cigarettes    Quit date: 12/03/2016    Years since quitting: 3.3  . Smokeless tobacco: Never Used  . Tobacco comment: quit smoking 8 months ago as of 10/15/18  Vaping Use  . Vaping Use: Never used  Substance and Sexual Activity  . Alcohol use: No    Alcohol/week: 0.0 standard drinks  . Drug use: No    Frequency: 7.0 times per week    Types: Marijuana    Comment: hx of and last used cocaine 2012. she was smoking marjiuana 7g/week. smoking thru out the day but quit 4 months ago  . Sexual activity: Not Currently    Comment: 1st intercourse 44 yo-Fewer than 5 partners  Other Topics Concern  . Not on file  Social History Narrative   Fun: Play piano; Build Lego   Denies abuse and feels safe at home. Living with friend in [place].    Right handed    Drink coffee 1 cup eveyday   Leave with 2 friends   1 story home, 16 steps   Social Determinants of Health   Financial Resource Strain:   . Difficulty of Paying Living Expenses: Not on file  Food Insecurity:   . Worried About Programme researcher, broadcasting/film/video in the Last Year: Not on file  . Ran Out of Food in the Last Year: Not on file  Transportation Needs:   . Lack of Transportation (Medical): Not on file  . Lack of Transportation (Non-Medical): Not on file  Physical Activity:   . Days of Exercise per Week: Not on file  . Minutes of Exercise per Session: Not on file  Stress:   . Feeling of Stress : Not on file  Social Connections:   . Frequency of Communication with Friends and Family: Not on  file  . Frequency of Social Gatherings with Friends and Family: Not on file  . Attends Religious Services: Not on file  . Active Member of Clubs or Organizations: Not on file  . Attends Banker Meetings: Not on file  . Marital Status: Not on file  Intimate Partner Violence:   . Fear of Current or Ex-Partner: Not on file  . Emotionally Abused: Not on file  . Physically Abused: Not on file  . Sexually Abused: Not on file    PHYSICAL EXAM: Blood pressure 95/61, pulse 98, resp. rate 18, height  (1.6 m), weight 99 lb (44.9 kg), SpO2 99 %. General: No acute distress.  Patient appears well-groomed.   Head:  Normocephalic/atraumatic Eyes:  Fundi examined but not visualized Neck: supple, no paraspinal tenderness, full range of motion Heart:  Regular rate and rhythm Lungs:  Clear to auscultation bilaterally Back:  No paraspinal tenderness Neurological Exam: alert and oriented to person, place, and time. Attention span and concentration intact, recalled 2 of 3 words after 5 minutes; remote memory intact, fund of knowledge intact.  Speech fluent and not dysarthric, language intact.  CN II-XII intact. Bulk and tone normal, muscle strength 5/5 throughout.  Sensation to light touch, temperature and vibration intact.  Deep tendon reflexes 2+ throughout, toes downgoing.  Finger to nose and heel to shin testing intact.  Gait normal, Romberg negative.  IMPRESSION: 1. Migraine without aura, without status migrainosus, not intractable 2.  Cognitive changes.  I am unclear what to make of her difficulty initiating movement with her hand.  She also reports short and long-term memory deficits, unusual for a 44 year old.  Unfortunately, she is unable to get an MRI but we will order CT head  PLAN: 1.  For evaluation of cognitive changes, CT head and neuropsychological testing.  Further recommendations pending results. 2.  For migraine prevention:  Aimovig 140mg  monthly 3.  For migraine rescue:   Ubrelvy 100mg  4.  Limit use of pain relievers to no more than 2 days out of week to prevent risk of rebound or medication-overuse headache. 5.  Keep headache diary 6.  Follow up in 6 months  , DO  CC: , MD

## 2020-04-19 ENCOUNTER — Encounter: Payer: Self-pay | Admitting: Neurology

## 2020-04-19 ENCOUNTER — Other Ambulatory Visit: Payer: Self-pay

## 2020-04-19 ENCOUNTER — Ambulatory Visit (INDEPENDENT_AMBULATORY_CARE_PROVIDER_SITE_OTHER): Payer: Medicare Other | Admitting: Neurology

## 2020-04-19 VITALS — BP 95/61 | HR 98 | Resp 18 | Ht 63.0 in | Wt 99.0 lb

## 2020-04-19 DIAGNOSIS — R4189 Other symptoms and signs involving cognitive functions and awareness: Secondary | ICD-10-CM

## 2020-04-19 DIAGNOSIS — G43009 Migraine without aura, not intractable, without status migrainosus: Secondary | ICD-10-CM

## 2020-04-19 NOTE — Patient Instructions (Addendum)
1.  Continue Aimovig 140mg  and Ubrelvy 2.  Will order CT head 3.  Will order neurocognitive testing 4.  Follow up in 6 months.  Further recommendations pending results of tests.  We have sent a referral to [facility] for your CT I and they will call you directly to schedule your appointment. They are located at [address] [facility]. If you need to contact them directly please call [phone].

## 2020-04-20 ENCOUNTER — Other Ambulatory Visit: Payer: Medicare Other

## 2020-04-20 DIAGNOSIS — R5383 Other fatigue: Secondary | ICD-10-CM

## 2020-04-20 LAB — CBC WITH DIFFERENTIAL/PLATELET
Absolute Monocytes: 378 cells/uL (ref 200–950)
Basophils Absolute: 49 cells/uL (ref 0–200)
Basophils Relative: 0.7 %
Eosinophils Absolute: 140 cells/uL (ref 15–500)
Eosinophils Relative: 2 %
HCT: 36.1 % (ref 35.0–45.0)
Hemoglobin: 12.2 g/dL (ref 11.7–15.5)
Lymphs Abs: 2667 cells/uL (ref 850–3900)
MCH: 30 pg (ref 27.0–33.0)
MCHC: 33.8 g/dL (ref 32.0–36.0)
MCV: 88.9 fL (ref 80.0–100.0)
MPV: 10.2 fL (ref 7.5–12.5)
Monocytes Relative: 5.4 %
Neutro Abs: 3766 cells/uL (ref 1500–7800)
Neutrophils Relative %: 53.8 %
Platelets: 241 10*3/uL (ref 140–400)
RBC: 4.06 10*6/uL (ref 3.80–5.10)
RDW: 11 % (ref 11.0–15.0)
Total Lymphocyte: 38.1 %
WBC: 7 10*3/uL (ref 3.8–10.8)

## 2020-04-21 ENCOUNTER — Encounter: Payer: Self-pay | Admitting: Internal Medicine

## 2020-04-21 ENCOUNTER — Other Ambulatory Visit: Payer: Self-pay | Admitting: Internal Medicine

## 2020-04-21 LAB — FERRITIN: Ferritin: 13 ng/mL — ABNORMAL LOW (ref 16–232)

## 2020-04-21 LAB — TRANSFERRIN: Transferrin: 288 mg/dL (ref 188–341)

## 2020-04-21 LAB — IRON, TOTAL/TOTAL IRON BINDING CAP
%SAT: 22 % (calc) (ref 16–45)
Iron: 85 ug/dL (ref 40–190)
TIBC: 382 mcg/dL (calc) (ref 250–450)

## 2020-04-21 MED ORDER — FERROUS GLUCONATE 240 (27 FE) MG PO TABS
240.0000 mg | ORAL_TABLET | ORAL | 0 refills | Status: DC
Start: 2020-04-21 — End: 2020-04-22

## 2020-04-22 MED ORDER — FERROUS GLUCONATE 324 (38 FE) MG PO TABS: 324.0000 mg | ORAL_TABLET | Freq: Every day | ORAL | 3 refills | Status: DC

## 2020-05-01 ENCOUNTER — Other Ambulatory Visit: Payer: Medicare Other

## 2020-05-03 ENCOUNTER — Ambulatory Visit
Admission: RE | Admit: 2020-05-03 | Discharge: 2020-05-03 | Disposition: A | Discharge status: Home / Self Care | Payer: Medicare Other | Source: Ambulatory Visit | Attending: Neurology | Admitting: Neurology

## 2020-05-03 DIAGNOSIS — G43009 Migraine without aura, not intractable, without status migrainosus: Secondary | ICD-10-CM

## 2020-05-03 DIAGNOSIS — R413 Other amnesia: Secondary | ICD-10-CM | POA: Diagnosis not present

## 2020-05-03 DIAGNOSIS — R4189 Other symptoms and signs involving cognitive functions and awareness: Secondary | ICD-10-CM

## 2020-05-08 ENCOUNTER — Other Ambulatory Visit: Payer: Self-pay | Admitting: Internal Medicine

## 2020-05-09 ENCOUNTER — Encounter (INDEPENDENT_AMBULATORY_CARE_PROVIDER_SITE_OTHER): Payer: Self-pay

## 2020-05-13 ENCOUNTER — Other Ambulatory Visit (HOSPITAL_COMMUNITY): Payer: Self-pay | Admitting: Psychiatry

## 2020-05-13 DIAGNOSIS — F639 Impulse disorder, unspecified: Secondary | ICD-10-CM

## 2020-05-13 DIAGNOSIS — F3181 Bipolar II disorder: Secondary | ICD-10-CM

## 2020-05-18 ENCOUNTER — Telehealth (INDEPENDENT_AMBULATORY_CARE_PROVIDER_SITE_OTHER): Payer: Medicare Other | Admitting: Psychiatry

## 2020-05-18 ENCOUNTER — Other Ambulatory Visit: Payer: Self-pay

## 2020-05-18 DIAGNOSIS — F639 Impulse disorder, unspecified: Secondary | ICD-10-CM | POA: Diagnosis not present

## 2020-05-18 DIAGNOSIS — F3181 Bipolar II disorder: Secondary | ICD-10-CM | POA: Diagnosis not present

## 2020-05-18 NOTE — Progress Notes (Signed)
Virtual Visit via Telephone Note  I connected with Karen Hahn on 05/18/20 at  3:00 PM EDT by telephone and verified that I am speaking with the correct person using two identifiers.  Location: Patient: home Provider: office   I discussed the limitations, risks, security and privacy concerns of performing an evaluation and management service by telephone and the availability of in person appointments. I also discussed with the patient that there may be a patient responsible charge related to this service. The patient expressed understanding and agreed to proceed.   History of Present Illness: Karen Hahn shares that she is doing well and nothing has really changed since our last visit. She denies any mood lability, excessive irritability or angry outbursts. Karen Hahn is enjoying her time with her roommates and the baby. She is not sleeping well due to her cat. 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 Her anxiety is manageable. She denies SI/HI. Pt denies recent manic and hypomanic symptoms including periods of decreased need for sleep, increased energy, mood lability, impulsivity, FOI, and excessive spending. Karen Hahn remains social, active and happy.     Observations/Objective:  General Appearance: unable to assess  Eye Contact:  unable to assess  Speech:  Clear and Coherent and Normal Rate  Volume:  Normal  Mood:  Euthymic  Affect:  Full Range  Thought Process:  Goal Directed, Linear and Descriptions of Associations: Intact  Orientation:  Full (Time, Place, and Person)  Thought Content:  Logical  Suicidal Thoughts:  No  Homicidal Thoughts:  No  Memory:  Immediate;   Good  Judgement:  Good  Insight:  Good  Psychomotor Activity: unable to assess  Concentration:  Concentration: Good  Recall:  Good  Fund of Knowledge:  Good  Language:  Good  Akathisia:  unable to assess  Handed:  Right  AIMS (if indicated):     Assets:  Communication Skills Desire for Improvement Financial  Resources/Insurance Housing Leisure Time Resilience Social Support Talents/Skills Transportation Vocational/Educational  ADL's:  unable to assess  Cognition:  WNL  Sleep:        I reviewed the information below on 05/18/2020 and have updated it Assessment and Plan:  MDD- recurrent, severe w/o psychotic features vs Bipolar 2 d/o Anorexia nervosa Impulse control d/o GAD ADHD Insomnia Cannabis use d/o   Lamictal 300mg  po qD- no refill today as she just picked up a bottle and has one refill pending   Pt is managing symptoms of anorexia, GAD, ADHD, insomnia on her own    Follow Up Instructions: In 3-4 months or sooner if needed   I discussed the assessment and treatment plan with the patient. The patient was provided an opportunity to ask questions and all were answered. The patient agreed with the plan and demonstrated an understanding of the instructions.   The patient was advised to call back or seek an in-person evaluation if the symptoms worsen or if the condition fails to improve as anticipated.  I provided 15 minutes of non-face-to-face time during this encounter.   , MD

## 2020-06-06 ENCOUNTER — Encounter: Payer: Self-pay | Admitting: Counselor

## 2020-06-06 ENCOUNTER — Ambulatory Visit: Payer: Medicare Other | Admitting: Psychology

## 2020-06-06 ENCOUNTER — Ambulatory Visit (INDEPENDENT_AMBULATORY_CARE_PROVIDER_SITE_OTHER): Payer: Medicare Other | Admitting: Counselor

## 2020-06-06 ENCOUNTER — Other Ambulatory Visit: Payer: Self-pay

## 2020-06-06 DIAGNOSIS — F639 Impulse disorder, unspecified: Secondary | ICD-10-CM | POA: Diagnosis not present

## 2020-06-06 DIAGNOSIS — F900 Attention-deficit hyperactivity disorder, predominantly inattentive type: Secondary | ICD-10-CM | POA: Diagnosis not present

## 2020-06-06 DIAGNOSIS — G43009 Migraine without aura, not intractable, without status migrainosus: Secondary | ICD-10-CM

## 2020-06-06 DIAGNOSIS — R419 Unspecified symptoms and signs involving cognitive functions and awareness: Secondary | ICD-10-CM | POA: Diagnosis not present

## 2020-06-06 DIAGNOSIS — F09 Unspecified mental disorder due to known physiological condition: Secondary | ICD-10-CM
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
## 2020-06-06 NOTE — Progress Notes (Signed)
   Psychometrist Note   Cognitive testing was administered to [place] by Milana Kidney, B.S. (Technician) under the supervision of Alphonzo Severance, Psy.D., ABN. Ms. Hoeger was able to tolerate all test procedures. Dr. Nicole Kindred met with the patient as needed to manage any emotional reactions to the testing procedures. Rest breaks were offered.    The battery of tests administered was selected by Dr. Nicole Kindred with consideration to the patient's current level of functioning, the nature of her symptoms, emotional and behavioral responses during the interview, level of literacy, observed level of motivation/effort, and the nature of the referral question. This battery was communicated to the psychometrist. Communication between Dr. Nicole Kindred and the psychometrist was ongoing throughout the evaluation and Dr. Nicole Kindred was immediately accessible at all times. Dr. Nicole Kindred provided supervision to the technician on the date of this service, to the extent necessary to assure the quality of all services provided.    Ms. Marchena will return in approximately one week for an interactive feedback session with Dr. Nicole Kindred, at which time test performance, clinical impressions, and treatment recommendations will be reviewed in detail. The patient understands she can contact our office should she require our assistance before this time.   A total of 110 minutes of billable time were spent with Shameka A Schriner by the technician, including test administration and scoring time. Billing for these services is reflected in Dr. Les Pou note.   This note reflects time spent with the psychometrician and does not include test scores, clinical history, or any interpretations made by Dr. Nicole Kindred. The full report will follow in a separate note.

## 2020-06-08 NOTE — Progress Notes (Signed)
NEUROPSYCHOLOGICAL TEST SCORES [facility]  Patient Name: Karen Hahn MRN: [id number] Date of Birth: 04-May-1976 Age: 44 y.o. Education: 12 years  Measurement properties of test scores: IQ, Index, and Standard Scores (SS): Mean = 100; Standard Deviation = 15 Scaled Scores (Ss): Mean = 10; Standard Deviation = 3 Z scores (Z): Mean = 0; Standard Deviation = 1 T scores (T); Mean = 50; Standard Deviation = 10  TEST SCORES:    Note: This summary of test scores accompanies the interpretive report and should not be interpreted by unqualified individuals or in isolation without reference to the report. Test scores are relative to age, gender, and educational history as available and appropriate.   Performance Validity        ACS: Raw Descriptor      Word Choice: 33 Below Expectation      Rey 15 and Recognition: Raw Descriptor      Free Recall 8 Below Expectation      Recognition 5 ---      False Positives 1 ---      Combined Score 12 Below Expectation      MSVT: Raw Descriptor      IR 70 Below Expectation      DR 75 Below Expectation      CNS 55 Below Expectation      PA  ---      FR  ---      The Dot Counting Test: Raw Descriptor      E-Score 18 Below Expectation      Embedded Measures: Raw Descriptor      RBANS Effort Index: 3 Within Expectation      WAIS-IV Reliable Digit Span 7 Within Expectation      WAIS-IV Reliable Digit Span Revised 10 Below Expectation      Expected Functioning        Wide Range Achievement Test (Word Reading): Standard/Scaled Score Percentile       Word Reading 93 32      Cognitive Testing        RBANS, Form : Standard/Scaled Score Percentile  Total Score 57 <1  Immediate Memory 53 <1      List Learning 3 1      Story Memory 2 <1  Visuospatial/Constructional 92 30      Figure Copy   (19) 11 63      Judgment of Line Orientation   (13) --- 10-16  Language 79 8      Picture Naming --- 3-9      Semantic Fluency 8 25    Attention 64 1      Digit Span 6 9      Coding 3 1  Delayed Memory 44 <1      List Recall   (0) --- <2      List Recognition   (14) --- <2      Story Recall   (2) 1 <1      Figure Recall   (7) 4 2      Wechsler Adult Intelligence Scale - IV: Standard/Scaled Score Percentile  Working Memory Index 71 3      Digit Span 6 9          Digit Span Forward 9 37          Digit Span Backward 6 9          Digit Span Sequencing 6 9      Arithmetic 4 2  Processing Speed  Index 74 4      Symbol Search 5 5      Coding 5 5      Neuropsychological Assessment Battery (Language Module): T-score Percentile      Naming   (31) 56 73      Verbal Fluency: T-score Percentile      Controlled Oral Word Association (F-A-S) 47 38      Semantic Fluency (Animals) 41 18      Trail Making Test: T-Score Percentile      Part A 49 46      Part B 61 86      Modified Wisconsin Card Sorting Test (MWCST): Standard/T-Score Percentile      Number of Categories Correct 59 82      Number of Perseverative Errors 43 25      Number of Total Errors 44 27      Percent Perseverative Errors 46 34  Executive Function Composite 102 55      Boston Diagnostic Aphasia Exam: Raw Score Scaled Score      Complex Ideational Material 8 3      Rating Scales         Raw Score Descriptor  Patient Health Questionnaire - 9 4 Within Normal Limits  GAD-7 2 Within Normal Limits      Conners' Adult ADHD Rating Scale: Self-Report (Long Version) T-Score Percentile      Inattention/Memory Problems 54 66      Hyperactivity/Restlessness 45 31      Impulsivity/Emotional Lability 41 18      Problems with Self-Concept 43 25      DSM-IV Inattentive Symptoms 77 >99      DSM-IV Hyperactive-Impulsive Symptoms 31 3      DSM-IV ADHD Symptoms Total 52 58      ADHD Index 54 66   Seriyah Collison V. Roseanne Reno PsyD, ABN Clinical Neuropsychologist

## 2020-06-09 NOTE — Progress Notes (Signed)
[facility]  Patient Name: Karen Hahn MRN: [id number] Date of Birth: November 08, 1975 Age: 44 y.o. Education: 55 years  Clinical Impressions  Karen Hahn is a 44 y.o., right-hand dominant, divorced woman with a history of eating disorder, impulse control disorder NOS, remote substance use issues, GAD, ADHD, insomnia, migraine headaches, and concussion. She was referred by my neurology colleague Dr. Metta Clines who is treating her for headaches. She is complaining of memory problems since perhaps age 15, affecting both short and long-term memory. She perceives her problems to be worsening over time. She is not working and is disabled related to her mental health conditions, although those appear to be relatively stable as per notes from Dr. Doyne Keel in psychiatry with whom she has followed for many years. She is otherwise independent with respect to functioning. She has several other symptoms including clumsiness/delayed movement of indeterminate cause and significance as per Dr. Tomi Likens.   Results of neuropsychological testing are deemed invalid and uninterpretable on the basis of multiple standalone and embedded validity indicators falling well below the level of expectations. In the context of these findings, low scores should not be interpreted as evidence of cognitive impairment. Adequate scores may be interpreted as evidence of at least adequate abilities, although actual abilities may be somewhat better. Karen Hahn demonstrated adequate performance on several challenging measures of executive function, on measures of naming and verbal fluency, and on measures of visuospatial/constructional abilities. She demonstrated low scores on indicators of memory, processing speed, and working memory; the significance of these low scores is unclear. She reported minimal levels of anxiety and depression, not reaching the threshold for clinical significance. Self-report  validity on the CAARS (a measure of ADHD symptomatology) was adequate, and on that measure she reported a level of inattentive symptoms that is very much above average.   The extent of Karen Hahn's cognitive difficulties is unclear, although she is demonstrating adequate performance with respect to executive function, language, and visuospatial function. She did report very high levels of inattentive symptoms and had an ADHD diagnosis in the past, which could be one factor affecting her day-to-day performance. I suspect that there is a component of somatization, given numerous risk factors for the condition and her self-reported very low levels of psychiatric distress.   Diagnostic Impressions: Attention and concentration deficit Depressive disorder (by history) Generalized Anxiety Disorder (by history)  Recommendations to be discussed with patient  Your performance and presentation on neuropsychological evaluation were consistent with performance validity problems. Just like a patient must lie still when undergoing an Xray for the picture to be interpretable, there are a set of assumptions that must be met for neuropsychological test findings to be of meaning. Your performance on validity measures suggests that we cannot be entirely sure that was the case. Validity problems can be caused by any number of things such as fatigue, difficulty maintaining attention, sensory impairments, idiosyncratic approaches to test taking, or misunderstanding test instructions.   Even with validity problems, your test scores contain some useful information. We can interpret adequate scores as reflecting at least adequate abilities, and you did well on several challenging measures of executive abilities, language abilities, and on measures of visuospatial/constructional abilities, which is reassuring. You also reported a level of DSM-IV ADHD inattentive symptoms that can be considered very much above average, suggesting  the need for further evaluation. ADHD can commonly present with memory and thinking problems and if you do have that condition, that could  be the cause of your day-to-day problems.   Individuals experience stress, depression, and anxiety in different ways. Sometimes, depression and anxiety are experienced as they are commonly understood, with sad mood and nervousness. Other times, people have underlying stress or affective problems that they are not fully aware of. This can sometimes put people at risk for a class of disorders called somatic symptom disorders. This is a class of disorders where psychiatric problems are experienced physically, in the body. They may also be experienced as difficulties with memory and thinking abilities. Somatic symptom disorders typically involve vague physical symptoms affecting multiple bodily systems, things like pain, fatigue, malaise, not feeling at one's best, headaches, and dizziness for which no definitive medical cause can be determined. There are aspects of your presentation that make me think that you could be at risk for a somatic symptom disorder. If this is the case, then your physical symptoms are unlikely to remit with medical treatment. The treatment of choice for somatic symptom disorders is psychotherapy but medication can also be helpful.   You are reporting very low levels of depression and anxiety symptoms, suggesting these conditions are well-controlled. That is fantastic! You also reported that you feel as though you do not have a sense of meaning in your life. If you are interested in returning to work, even on a part time basis, the [place] may be a helpful resource. The local [facility] is located at [address] in [place]. They can be reached at [phone].      Test Findings  Test scores are summarized in additional documentation associated with this encounter. Test scores are relative  to age, gender, and educational history as available and appropriate. There were notable concerns about performance validity given multiple standalone and embedded measures falling well below the level of expectations, rendering cognitive test scores invalid and uninterpretable. Self-report validity was adequate on the CAARS, suggesting that the items were at least endorsed in a consistent manner.    General Intellectual Functioning/Achievement:  Performance on single word reading was average, which presents as a reasonable standard of comparison for the patient's cognitive test performance.   Attention and Processing Efficiency: Indicators of attention and processing efficiency generated mainly low scores, of uncertain significance in the context of performance validity concerns. She did demonstrate adequate digit repetition forward on one measure.   Indicators of processing speed generated low scores across the board, of dubious significance.   Language: Performance on language measures was good with errorless visual object confrontation naming and normal scores on two different measures of category fluency and one measure of phonemic fluency, suggesting adequate capacities.   Visuospatial Function: Visuospatial and constructional findings generated adequate scores on both constructional and perceptual measures.   Learning and Memory: Indicators of learning and memory were low, the significance of which is unclear in the context of performance validity concerns.   Executive Functions: Performance was good on most executive measures with adequate range scores on the [employer], alternating sequencing of numbers and letters of the alphabet, and on generation of words in response to given letters.   Rating Scale(s): Ms. Lisa reported very low levels of symptoms on selfreport indicators of anxiety and depression symptoms. She reported a level of ADHD inattentive  symptoms on the CAARS that can be considered very much above average. All other scales were within normal limits, as was the ADHD index.   Viviano Simas Nicole Kindred, PsyD,  [place]

## 2020-06-12 ENCOUNTER — Other Ambulatory Visit: Payer: Self-pay | Admitting: Internal Medicine

## 2020-06-14 ENCOUNTER — Ambulatory Visit: Payer: Medicare Other | Admitting: Neurology

## 2020-06-14 ENCOUNTER — Encounter: Payer: Medicare Other | Admitting: Counselor

## 2020-06-22 ENCOUNTER — Telehealth: Payer: Self-pay | Admitting: Neurology

## 2020-06-22 ENCOUNTER — Ambulatory Visit (INDEPENDENT_AMBULATORY_CARE_PROVIDER_SITE_OTHER): Payer: Medicare Other | Admitting: Counselor

## 2020-06-22 ENCOUNTER — Encounter: Payer: Self-pay | Admitting: Counselor

## 2020-06-22 ENCOUNTER — Other Ambulatory Visit: Payer: Self-pay

## 2020-06-22 DIAGNOSIS — R41840 Attention and concentration deficit: Secondary | ICD-10-CM

## 2020-06-22 NOTE — Telephone Encounter (Signed)
Patient is here for neuropsychological results today. She is asking for a sample of Aimovig 140 MG. It is delayed at the pharmacy but she normally takes it in the morning.

## 2020-06-22 NOTE — Patient Instructions (Signed)
Your performance and presentation on neuropsychological evaluation were consistent with performance validity problems. Just like a patient must lie still when undergoing an Xray for the picture to be interpretable, there are a set of assumptions that must be met for neuropsychological test findings to be of meaning. Your performance on validity measures suggests that we cannot be entirely sure that was the case. Validity problems can be caused by any number of things such as fatigue, difficulty maintaining attention, sensory impairments, idiosyncratic approaches to test taking, or misunderstanding test instructions.   Even with validity problems, your test scores contain some useful information. We can interpret adequate scores as reflecting at least adequate abilities, and you did well on several challenging measures of executive abilities, language abilities, and on measures of visuospatial/constructional abilities, which is reassuring. You also reported a level of DSM-IV ADHD inattentive symptoms that can be considered very much above average, suggesting the need for further evaluation. ADHD can commonly present with memory and thinking problems and if you do have that condition, that could be the cause of your day-to-day problems.   Individuals experience stress, depression, and anxiety in different ways. Sometimes, depression and anxiety are experienced as they are commonly understood, with sad mood and nervousness. Other times, people have underlying stress or affective problems that they are not fully aware of. This can sometimes put people at risk for a class of disorders called somatic symptom disorders. This is a class of disorders where psychiatric problems are experienced physically, in the body. They may also be experienced as difficulties with memory and thinking abilities. Somatic symptom disorders typically involve vague physical symptoms affecting multiple bodily systems, things like pain,  fatigue, malaise, not feeling at one's best, headaches, and dizziness for which no definitive medical cause can be determined. There are aspects of your presentation that make me think that you could be at risk for a somatic symptom disorder. If this is the case, then your physical symptoms are unlikely to remit with medical treatment. The treatment of choice for somatic symptom disorders is psychotherapy but medication can also be helpful.   You are reporting very low levels of depression and anxiety symptoms, suggesting these conditions are well-controlled. That is fantastic! You also reported that you feel as though you do not have a sense of meaning in your life. If you are interested in returning to work, even on a part time basis, the [facility] may be a helpful resource. The local [facility] is located at [address] in [place]. They can be reached at [phone].

## 2020-06-22 NOTE — Progress Notes (Signed)
NEUROPSYCHOLOGY FEEDBACK NOTE [facility]  Feedback Note: I met with Karen Hahn to review the findings resulting from her neuropsychological evaluation. Since the last appointment, she has been about the same. She continues with her "movement delay" and her memory problems. Time was spent reviewing the impressions and recommendations that are detailed in the evaluation report. We discussed the impression of invalid test data, albeit with preserved performance in many areas. I was candid with her that she has numerous risk factors for conversion disorder and if that is the case, her symptoms are unlikely to remit with medical treatments. She also has a bit of la belle indifference. I took time to explain the findings and answer all the patient's questions. I encouraged Karen Hahn to contact me should she have any further questions or if further follow up is desired.   Current Medications and Medical History   Current Outpatient Medications  Medication Sig Dispense Refill  . Calcium-Iron-Vit D-Vit K (CALCIUM SOFT CHEWS) [id number] MG-UNT-MCG CHEW Chew by mouth daily.    . celecoxib (CELEBREX) 200 MG capsule celecoxib 200 mg capsule  TAKE 1 CAPSULE BY MOUTH TWICE DAILY (Patient not taking: Reported on 04/19/2020)    . dicyclomine (BENTYL) 10 MG capsule TAKE 1 CAPSULE BY MOUTH FOUR TIMES DAILY BEFORE MEALS AND AT BEDTIME 120 capsule 5  . Erenumab-aooe (AIMOVIG) 140 MG/ML SOAJ Inject 140 mg into the skin every 30 (thirty) days. 1 pen 11  . ferrous gluconate (FERGON) 324 MG tablet Take 1 tablet (324 mg total) by mouth daily with breakfast. 90 tablet 3  . ibuprofen (ADVIL) 600 MG tablet TAKE 1 TABLET(600 MG) BY MOUTH EVERY 6 HOURS AS NEEDED 120 tablet 3  . lamoTRIgine (LAMICTAL) 150 MG tablet TAKE 2 TABLETS(300 MG) BY MOUTH DAILY 180 tablet 0  . Multiple Vitamins-Minerals (MULTIVITAMIN GUMMIES ADULT) CHEW Chew by mouth.    . naproxen (NAPROSYN) 500 MG tablet Take 1 tablet (500 mg  total) by mouth 2 (two) times daily as needed. 20 tablet 3  . nystatin (MYCOSTATIN) 100000 UNIT/ML suspension TAKE 5 MLS BY MOUTH FOUR TIMES DAILY X 10 DAYS (Patient not taking: Reported on 04/19/2020) 60 mL 0  . oxyCODONE-acetaminophen (PERCOCET/ROXICET) 5-325 MG tablet Take 1 tablet by mouth every 6 (six) hours.    . pantoprazole (PROTONIX) 40 MG tablet TAKE 1 TABLET BY MOUTH EVERY MORNING 90 tablet 0  . promethazine (PHENERGAN) 25 MG tablet TAKE 1 TABLET(25 MG) BY MOUTH TWICE DAILY AS NEEDED 60 tablet 2  . rizatriptan (MAXALT-MLT) 10 MG disintegrating tablet TAKE 1 TABLET BY MOUTH AS NEEDED FOR MIGRANE. MAY REPEAT IN 2 HOURS IF NEEDED 10 tablet 2  . rosuvastatin (CRESTOR) 20 MG tablet TAKE 1 TABLET(20 MG) BY MOUTH DAILY 90 tablet 3  . Ubrogepant (UBRELVY) 100 MG TABS Take 1 tablet by mouth as needed (May repeat dose after 2 hours.  Maximum 2 tablets in 24 hours). 16 tablet 11   No current facility-administered medications for this visit.    Patient Active Problem List   Diagnosis Date Noted  . Weight loss 02/21/2020  . Right wrist pain 07/29/2019  . Right hand pain 07/29/2019  . Intractable headache 04/14/2019  . Hyperglycemia 02/18/2019  . HLD (hyperlipidemia) 02/16/2018  . Encounter for well adult exam with abnormal findings 05/21/2017  . UTI (urinary tract infection) 05/21/2017  . Thrush 05/21/2017  . Vitamin D deficiency 05/21/2017  . Osteopenia 05/21/2017  . Dyspnea 05/21/2017  . Hematuria 01/01/2017  . Lump in female  breast 01/01/2017  . Fatigue 01/01/2017  . Candida vaginitis 01/01/2017  . Dysuria 12/19/2016  . Gastroesophageal reflux disease with esophagitis 11/14/2016  . Irritable bowel syndrome with constipation 11/14/2016  . Severe episode of recurrent major depressive disorder, without psychotic features ([place]) 08/24/2015  . Cough 11/15/2014  . Major depressive disorder, recurrent episode, moderate ([place]) 07/14/2014  . ADD (attention deficit hyperactivity disorder,  inattentive type) 07/14/2014  . Impulse control disorder 07/14/2014  . Depression 03/08/2014  . GAD (generalized anxiety disorder) 03/08/2014  . Anorexia nervosa 11/17/2013  . Impulse control disorder, unspecified 11/17/2013  . Cannabis abuse 11/17/2013  . Insomnia 11/17/2013    Mental Status and Behavioral Observations  Karen Hahn presented on time to the present encounter and was alert and generally oriented. Speech was normal in rate, rhythm, volume, and prosody. Self-reported mood was "good" and affect was eithymic. Thought process was logical and goal oriented and thought content was appropriate to the topics discussed. There were no safety concerns identified at today's encounter, such as thoughts of harming self or others.   Plan  Feedback provided regarding the patient's neuropsychological evaluation. Her test data are invalid, although given that she has had these issues for 16 years and they have not worsened clinically, I think the likelihood of any "organic" underlying cause is low. Karen Hahn was encouraged to contact me if any questions arise or if further follow up is desired.   Viviano Simas Nicole Kindred, PsyD, ABN Clinical Neuropsychologist  Service(s) Provided at This Encounter: 22 minutes [phone]; Psychotherapy with patient/family)

## 2020-07-07 DIAGNOSIS — M25531 Pain in right wrist: Secondary | ICD-10-CM | POA: Diagnosis not present

## 2020-07-09 ENCOUNTER — Other Ambulatory Visit: Payer: Self-pay | Admitting: Internal Medicine

## 2020-07-11 ENCOUNTER — Encounter: Payer: Self-pay | Admitting: Physician Assistant

## 2020-07-11 ENCOUNTER — Ambulatory Visit (INDEPENDENT_AMBULATORY_CARE_PROVIDER_SITE_OTHER): Payer: Medicare Other | Admitting: Physician Assistant

## 2020-07-11 VITALS — BP 90/62 | HR 100 | Ht 63.0 in | Wt 97.0 lb

## 2020-07-11 DIAGNOSIS — R112 Nausea with vomiting, unspecified: Secondary | ICD-10-CM | POA: Diagnosis not present

## 2020-07-11 DIAGNOSIS — R1013 Epigastric pain: Secondary | ICD-10-CM | POA: Diagnosis not present

## 2020-07-11 DIAGNOSIS — G8929 Other chronic pain: Secondary | ICD-10-CM

## 2020-07-11 MED ORDER — ONDANSETRON HCL 4 MG PO TABS: 4.0000 mg | ORAL_TABLET | Freq: Four times a day (QID) | ORAL | 2 refills | Status: DC | PRN

## 2020-07-11 NOTE — Progress Notes (Signed)
Chief Complaint: Nausea  HPI:    Karen Hahn is a 44 year old female, known to Dr. Marina Goodell, with a past medical history as listed below including GAD, GERD and multiple others, who was referred to me by Corwin Levins, MD for a complaint of nausea.      05/12/2018 office visit with Dr. Marina Goodell for weight loss and question of malabsorption disorder.  Was noted the patient's history significant for multiple behavioral health issues including anorexia nervosa, generalized anxiety disorder, impulse control disorder, chronic cannabis use, borderline personality traits and depression.  At that time reported EGD and colonoscopy with Dr. Randa Evens.  She was noted to be disabled due to her mental health issues.  Describe occasional nausea at that time for which promethazine helped.  Also on Protonix for history of reflux.  At that time recommended an EGD.    06/03/2018 EGD with prior gastric surgery noted with small bowel limbs as described.  Mild duodenitis and otherwise normal.  There was some question if her symptoms are related to prior SMA syndrome.    Today, the patient presents to clinic and tells me that she has always had some abdominal issues but over the past few months these have gotten worse.  Tells me that she has an epigastric cramping pain which comes on every day and nausea.  She typically takes the Promethazine in the morning and this helps initially, but throughout the day the nausea grows and sometimes she has episodes of vomiting.  When she feels the nausea is severe she will take a Pepto which "helps everything".  Currently still using her Dicyclomine 10 mg 4 times daily, tells me this used to help with the cramping sensation but it has stopped working.  Patient is nervous given her history of abdominal surgery and issues with her stomach that something else is going on.  Reports that she is no longer using marijuana, "for a long time now".  Also tells me that heating pad on her stomach seems to help  some.    Denies fever, chills, weight loss or change in bowel habits.     Past Medical History:  Diagnosis Date  . ADD (attention deficit disorder)   . Anorexia nervosa without bulimia   . Chronic constipation   . Drug abuse (HCC)   . GAD (generalized anxiety disorder)   . GERD (gastroesophageal reflux disease)   . History of gastric restrictive surgery   . History of gastric ulcer   . History of Helicobacter pylori infection    after 2003  . HLD (hyperlipidemia) 02/16/2018  . Impulse control disorder   . Moderate major depression (HCC)   . Osteopenia   . Post concussion syndrome    fell 10/20/2015  . SMAS (superior mesenteric artery syndrome) (HCC)   . Urge urinary incontinence     Past Surgical History:  Procedure Laterality Date  . DILATION AND CURETTAGE OF UTERUS  2005  . INTERSTIM IMPLANT PLACEMENT N/A 10/18/2015   Procedure: [name] IMPLANT FIRST STAGE;  Surgeon: Jerilee Field, MD;  Location: [facility];  Service: Urology;  Laterality: N/A;  . INTERSTIM IMPLANT PLACEMENT N/A 10/18/2015   Procedure: [name] IMPLANT SECOND STAGE;  Surgeon: Jerilee Field, MD;  Location: [facility];  Service: Urology;  Laterality: N/A;  . LAPAROSCOPIC GASTRIC RESTRICTIVE DUODENAL PROCEDURE (DUODENAL SWITCH)  12/ 2003  . ORIF RIGHT WRIST FX  2013   REMOVAL HARDWARE X2 in  2013--  No retained hardware    Current  Outpatient Medications  Medication Sig Dispense Refill  . Calcium-Iron-Vit D-Vit K (CALCIUM SOFT CHEWS) [id number] MG-UNT-MCG CHEW Chew by mouth daily.    . celecoxib (CELEBREX) 200 MG capsule celecoxib 200 mg capsule  TAKE 1 CAPSULE BY MOUTH TWICE DAILY (Patient not taking: Reported on 04/19/2020)    . dicyclomine (BENTYL) 10 MG capsule TAKE 1 CAPSULE BY MOUTH FOUR TIMES DAILY BEFORE MEALS AND AT BEDTIME 120 capsule 5  . Erenumab-aooe (AIMOVIG) 140 MG/ML SOAJ Inject 140 mg into the skin every 30 (thirty) days. 1 pen 11  . ferrous gluconate  (FERGON) 324 MG tablet Take 1 tablet (324 mg total) by mouth daily with breakfast. 90 tablet 3  . ibuprofen (ADVIL) 600 MG tablet TAKE 1 TABLET(600 MG) BY MOUTH EVERY 6 HOURS AS NEEDED 120 tablet 3  . lamoTRIgine (LAMICTAL) 150 MG tablet TAKE 2 TABLETS(300 MG) BY MOUTH DAILY 180 tablet 0  . Multiple Vitamins-Minerals (MULTIVITAMIN GUMMIES ADULT) CHEW Chew by mouth.    . naproxen (NAPROSYN) 500 MG tablet Take 1 tablet (500 mg total) by mouth 2 (two) times daily as needed. 20 tablet 3  . nystatin (MYCOSTATIN) 100000 UNIT/ML suspension TAKE 5 MLS BY MOUTH FOUR TIMES DAILY X 10 DAYS (Patient not taking: Reported on 04/19/2020) 60 mL 0  . oxyCODONE-acetaminophen (PERCOCET/ROXICET) 5-325 MG tablet Take 1 tablet by mouth every 6 (six) hours.    . pantoprazole (PROTONIX) 40 MG tablet TAKE 1 TABLET BY MOUTH EVERY MORNING 90 tablet 0  . promethazine (PHENERGAN) 25 MG tablet TAKE 1 TABLET(25 MG) BY MOUTH TWICE DAILY AS NEEDED 60 tablet 2  . rizatriptan (MAXALT-MLT) 10 MG disintegrating tablet TAKE 1 TABLET BY MOUTH AS NEEDED FOR MIGRANE. MAY REPEAT IN 2 HOURS IF NEEDED 10 tablet 2  . rosuvastatin (CRESTOR) 20 MG tablet TAKE 1 TABLET(20 MG) BY MOUTH DAILY 90 tablet 3  . Ubrogepant (UBRELVY) 100 MG TABS Take 1 tablet by mouth as needed (May repeat dose after 2 hours.  Maximum 2 tablets in 24 hours). 16 tablet 11   No current facility-administered medications for this visit.    Allergies as of 07/11/2020 - Review Complete 05/18/2020  Allergen Reaction Noted  . Bactrim [sulfamethoxazole-trimethoprim] Other (See Comments) 11/15/2014  . Penicillins Other (See Comments) 11/16/2013  . Ciprofloxacin Itching 06/19/2015  . Morphine and related Itching and Other (See Comments) 09/01/2015  . Reglan [metoclopramide]  12/05/2015  . Benadryl [diphenhydramine hcl] Palpitations 12/05/2015  . Diflucan [fluconazole] Rash 11/16/2013    Family History  Problem Relation Age of Onset  . Alcohol abuse Mother   . Anxiety  disorder Mother   . Depression Mother        severe with hx of suicide attempt. treated with ECT  . COPD Mother   . Cancer Mother        Cervical  . Stroke Mother   . Anxiety disorder Father   . Drug abuse Father   . COPD Father   . Alcohol abuse Maternal Uncle   . Anxiety disorder Maternal Grandmother   . Depression Maternal Grandmother   . ADD / ADHD Brother   . Breast cancer Paternal Grandmother 63  . Stomach cancer Neg Hx   . Colon cancer Neg Hx   . Rectal cancer Neg Hx   . Esophageal cancer Neg Hx     Social History   Socioeconomic History  . Marital status: Divorced    Spouse name: Not on file  . Number of children: 0  . Years of education:  14  . Highest education level: Not on file  Occupational History  . Occupation: Disability  Tobacco Use  . Smoking status: Former Smoker    Packs/day: 0.50    Years: 1.00    Pack years: 0.50    Types: Cigarettes    Quit date: 12/03/2016    Years since quitting: 3.6  . Smokeless tobacco: Never Used  . Tobacco comment: quit smoking 8 months ago as of 10/15/18  Vaping Use  . Vaping Use: Never used  Substance and Sexual Activity  . Alcohol use: No    Alcohol/week: 0.0 standard drinks  . Drug use: No    Frequency: 7.0 times per week    Types: Marijuana    Comment: hx of and last used cocaine 2012. she was smoking marjiuana 7g/week. smoking thru out the day but quit 4 months ago  . Sexual activity: Not Currently    Comment: 1st intercourse 44 yo-Fewer than 5 partners  Other Topics Concern  . Not on file  Social History Narrative   Fun: Play piano; Build Lego   Denies abuse and feels safe at home. Living with friend in [place].    Right handed    Drink coffee 1 cup eveyday   Leave with 2 friends   1 story home, 16 steps   Social Determinants of Health   Financial Resource Strain:   . Difficulty of Paying Living Expenses: Not on file  Food Insecurity:   . Worried About Programme researcher, broadcasting/film/video in the Last Year: Not on  file  . Ran Out of Food in the Last Year: Not on file  Transportation Needs:   . Lack of Transportation (Medical): Not on file  . Lack of Transportation (Non-Medical): Not on file  Physical Activity:   . Days of Exercise per Week: Not on file  . Minutes of Exercise per Session: Not on file  Stress:   . Feeling of Stress : Not on file  Social Connections:   . Frequency of Communication with Friends and Family: Not on file  . Frequency of Social Gatherings with Friends and Family: Not on file  . Attends Religious Services: Not on file  . Active Member of Clubs or Organizations: Not on file  . Attends Banker Meetings: Not on file  . Marital Status: Not on file  Intimate Partner Violence:   . Fear of Current or Ex-Partner: Not on file  . Emotionally Abused: Not on file  . Physically Abused: Not on file  . Sexually Abused: Not on file    Review of Systems:    Constitutional: No weight loss, fever or chills Cardiovascular: No chest pain   Respiratory: No SOB  Gastrointestinal: See HPI and otherwise negative   Physical Exam:  Vital signs: BP 90/62   Pulse 100   Ht  (1.6 m)   Wt 97 lb (44 kg)   LMP 06/26/2020 (Approximate)   BMI 17.18 kg/m   Constitutional: Caucasian female appears to be in NAD, Well developed, Well nourished, alert and cooperative Respiratory: Respirations even and unlabored. Lungs clear to auscultation bilaterally.   No wheezes, crackles, or rhonchi.  Cardiovascular: Normal S1, S2. No MRG. Regular rate and rhythm. No peripheral edema, cyanosis or pallor.  Gastrointestinal:  Soft, nondistended, moderate epigastric ttp. No rebound or guarding. Normal bowel sounds. No appreciable masses or hepatomegaly.+midline surgical scar Rectal:  Not performed.  Psychiatric: Demonstrates good judgement and reason without abnormal affect or behaviors.  RELEVANT LABS  AND IMAGING: CBC    Component Value Date/Time   WBC 7.0 04/20/2020 1627   RBC 4.06  04/20/2020 1627   HGB 12.2 04/20/2020 1627   HCT 36.1 04/20/2020 1627   PLT 241 04/20/2020 1627   MCV 88.9 04/20/2020 1627   MCH 30.0 04/20/2020 1627   MCHC 33.8 04/20/2020 1627   RDW 11.0 04/20/2020 1627   LYMPHSABS 2,667 04/20/2020 1627   MONOABS 0.4 02/21/2020 1702   EOSABS 140 04/20/2020 1627   BASOSABS 49 04/20/2020 1627    CMP     Component Value Date/Time   NA 139 02/21/2020 1702   K 4.1 02/21/2020 1702   CL 105 02/21/2020 1702   CO2 30 02/21/2020 1702   GLUCOSE 63 (L) 02/21/2020 1702   BUN 10 02/21/2020 1702   CREATININE 0.73 02/21/2020 1702   CALCIUM 9.4 02/21/2020 1702   PROT 6.9 02/21/2020 1702   ALBUMIN 4.4 02/21/2020 1702   AST 33 02/21/2020 1702   ALT 42 (H) 02/21/2020 1702   ALKPHOS 64 02/21/2020 1702   BILITOT 0.4 02/21/2020 1702   GFRNONAA >60 12/25/2016 1201   GFRAA >60 12/25/2016 1201    Assessment: 1.  Epigastric pain: With below 2.  Nausea and vomiting: History of similar symptoms, also history of SMA syndrome, also with increasing epigastric pain; consider functional versus gastritis versus other  Plan: 1.  Patient is requesting a repeat EGD.  I feel this is acceptable as it has been 2 years since we did this last and symptoms are exacerbated per the patient.  Scheduled patient for an EGD in the [facility] with Dr. Marina Goodell.  Patient reviewed a detailed handout regarding risks for the procedure and agrees to proceed.  She will be Covid tested 2 days prior to time of procedure. 2.  We will increase her Dicyclomine to 20 mg 4 times daily for now.  Patient tells me she has "plenty" of 10 mg tabs at home and will just take 2.  She will let me know if this is working and we can increase dosage. 3.  Continue Promethazine in the morning. 4.  Prescribed Zofran to be used every 4 hours as needed for breakthrough nausea symptoms #30 with 2 refills.  Discussed that she should not use the Promethazine and Zofran together but instead wait at least 4 hours before her first  dose of Zofran. 5.  Patient to follow in clinic per recommendations from Dr. Marina Goodell after time of procedure.  Hyacinth Meeker, PA-C  Gastroenterology 07/11/2020, 3:04 PM  Cc: Corwin Levins, MD

## 2020-07-11 NOTE — Progress Notes (Signed)
Assessment and plan noted ?

## 2020-07-11 NOTE — Patient Instructions (Signed)
If you are age 42 or older, your body mass index should be between 23-30. Your Body mass index is 17.18 kg/m. If this is out of the aforementioned range listed, please consider follow up with your Primary Care Provider.  If you are age 21 or younger, your body mass index should be between 19-25. Your Body mass index is 17.18 kg/m. If this is out of the aformentioned range listed, please consider follow up with your Primary Care Provider.   You have been scheduled for an endoscopy. Please follow written instructions given to you at your visit today. If you use inhalers (even only as needed), please bring them with you on the day of your procedure.  We have sent the following medications to your pharmacy for you to pick up at your convenience: Zofran 4 mg once every 6 hours as needed for nausea.   Increase your dicyclomine to 2 10 mg tables 4 times daily.  Thank you for choosing me and [facility].  Hyacinth Meeker, PA-C

## 2020-07-12 ENCOUNTER — Telehealth: Payer: Self-pay | Admitting: Physician Assistant

## 2020-07-12 NOTE — Telephone Encounter (Signed)
Spoke with pt and she states this afternoon she passed some hard stool and she noticed some BRB when she wiped. Discussed with pt that she could try some OTC prep h supp and some stool softeners. Pt wanted to be sure and let Victorino Dike know as this was new. Jennifer notified.

## 2020-07-12 NOTE — Telephone Encounter (Signed)
Pt states that she experienced rectal bleeding with bm this morning. She wants to know if there is something else that she should do while she waits for her procedure.

## 2020-07-13 ENCOUNTER — Encounter: Payer: Self-pay | Admitting: Internal Medicine

## 2020-07-13 NOTE — Telephone Encounter (Signed)
Spoke with pt and she is aware.

## 2020-07-13 NOTE — Telephone Encounter (Signed)
Thank you for letting me know, yes she can see how she does with a Preparation H, for bleeding increases then she needs to call back and let us know.  Thanks, JLL

## 2020-07-24 ENCOUNTER — Other Ambulatory Visit: Payer: Self-pay | Admitting: Internal Medicine

## 2020-07-24 DIAGNOSIS — Z1159 Encounter for screening for other viral diseases: Secondary | ICD-10-CM | POA: Diagnosis not present

## 2020-07-25 ENCOUNTER — Encounter: Payer: Self-pay | Admitting: Internal Medicine

## 2020-07-25 LAB — SARS CORONAVIRUS 2 (TAT 6-24 HRS): SARS Coronavirus 2: NEGATIVE

## 2020-07-25 MED ORDER — HYDROCORTISONE (PERIANAL) 2.5 % EX CREA: 1.0000 "application " | TOPICAL_CREAM | Freq: Two times a day (BID) | CUTANEOUS | 0 refills | Status: DC

## 2020-07-27 ENCOUNTER — Encounter: Payer: Self-pay | Admitting: Internal Medicine

## 2020-07-27 ENCOUNTER — Other Ambulatory Visit: Payer: Self-pay

## 2020-07-27 ENCOUNTER — Ambulatory Visit (AMBULATORY_SURGERY_CENTER): Payer: Medicare Other | Admitting: Internal Medicine

## 2020-07-27 VITALS — BP 100/62 | HR 75 | Temp 98.3°F | Resp 9 | Ht 63.0 in | Wt 97.0 lb

## 2020-07-27 DIAGNOSIS — R1013 Epigastric pain: Secondary | ICD-10-CM | POA: Diagnosis not present

## 2020-07-27 DIAGNOSIS — R112 Nausea with vomiting, unspecified: Secondary | ICD-10-CM | POA: Diagnosis not present

## 2020-07-27 DIAGNOSIS — G8929 Other chronic pain: Secondary | ICD-10-CM

## 2020-07-27 DIAGNOSIS — R110 Nausea: Secondary | ICD-10-CM | POA: Diagnosis not present

## 2020-07-27 MED ORDER — SODIUM CHLORIDE 0.9 % IV SOLN: 500.0000 mL | Freq: Once | INTRAVENOUS | Status: DC

## 2020-07-27 NOTE — Patient Instructions (Signed)
YOU HAD AN ENDOSCOPIC PROCEDURE TODAY AT THE [facility]:   Refer to the procedure report that was given to you for any specific questions about what was found during the examination.  If the procedure report does not answer your questions, please call your gastroenterologist to clarify.  If you requested that your care partner not be given the details of your procedure findings, then the procedure report has been included in a sealed envelope for you to review at your convenience later.  YOU SHOULD EXPECT: Some feelings of bloating in the abdomen. Passage of more gas than usual.  Walking can help get rid of the air that was put into your GI tract during the procedure and reduce the bloating. If you had a lower endoscopy (such as a colonoscopy or flexible sigmoidoscopy) you may notice spotting of blood in your stool or on the toilet paper. If you underwent a bowel prep for your procedure, you may not have a normal bowel movement for a few days.  Please Note:  You might notice some irritation and congestion in your nose or some drainage.  This is from the oxygen used during your procedure.  There is no need for concern and it should clear up in a day or so.  SYMPTOMS TO REPORT IMMEDIATELY:    Following upper endoscopy (EGD)  Vomiting of blood or coffee ground material  New chest pain or pain under the shoulder blades  Painful or persistently difficult swallowing  New shortness of breath  Fever of 100F or higher  Black, tarry-looking stools  For urgent or emergent issues, a gastroenterologist can be reached at any hour by calling [phone]. Do not use MyChart messaging for urgent concerns.    DIET:  We do recommend a small meal at first, but then you may proceed to your regular diet.  Drink plenty of fluids but you should avoid alcoholic beverages for 24 hours.  ACTIVITY:  You should plan to take it easy for the rest of today and you should NOT DRIVE or use heavy machinery  until tomorrow (because of the sedation medicines used during the test).    FOLLOW UP: Our staff will call the number listed on your records 48-72 hours following your procedure to check on you and address any questions or concerns that you may have regarding the information given to you following your procedure. If we do not reach you, we will leave a message.  We will attempt to reach you two times.  During this call, we will ask if you have developed any symptoms of COVID 19. If you develop any symptoms (ie: fever, flu-like symptoms, shortness of breath, cough etc.) before then, please call [phone].  If you test positive for Covid 19 in the 2 weeks post procedure, please call and report this information to us.    If any biopsies were taken you will be contacted by phone or by letter within the next 1-3 weeks.  Please call us at [phone] if you have not heard about the biopsies in 3 weeks.    SIGNATURES/CONFIDENTIALITY: You and/or your care partner have signed paperwork which will be entered into your electronic medical record.  These signatures attest to the fact that that the information above on your After Visit Summary has been reviewed and is understood.  Full responsibility of the confidentiality of this discharge information lies with you and/or your care-partner. 

## 2020-07-27 NOTE — Op Note (Signed)
[facility] Patient Name: Karen Hahn Procedure Date: 07/27/2020 3:46 PM MRN: [id number] Endoscopist: Wilhemina Bonito. Marina Goodell , MD Age: 44 Referring MD:  Date of Birth: 12/23/1975 Gender: Female Account #: [id number] Procedure:                Upper GI endoscopy Indications:              Epigastric abdominal pain, Nausea Medicines:                Monitored Anesthesia Care Procedure:                Pre-Anesthesia Assessment:                           - Prior to the procedure, a History and Physical                            was performed, and patient medications and                            allergies were reviewed. The patient's tolerance of                            previous anesthesia was also reviewed. The risks                            and benefits of the procedure and the sedation                            options and risks were discussed with the patient.                            All questions were answered, and informed consent                            was obtained. Prior Anticoagulants: The patient has                            taken no previous anticoagulant or antiplatelet                            agents. ASA Grade Assessment: II - A patient with                            mild systemic disease. After reviewing the risks                            and benefits, the patient was deemed in                            satisfactory condition to undergo the procedure.                           After obtaining informed consent, the endoscope was  passed under direct vision. Throughout the                            procedure, the patient's blood pressure, pulse, and                            oxygen saturations were monitored continuously. The                            Endoscope was introduced through the mouth, and                            advanced to the second part of duodenum. The upper                            GI endoscopy was  accomplished without difficulty.                            The patient tolerated the procedure well. Scope In: Scope Out: Findings:                 The esophagus was normal.                           The stomach revealed evidence of prior                            gastrojejunostomy with a widely patent anastomosis                            and normal afferent and efferent limbs. The stomach                            was otherwise normal.                           The examined duodenum was normal.                           The cardia and gastric fundus were normal on                            retroflexion. Complications:            No immediate complications. Estimated Blood Loss:     Estimated blood loss: none. Impression:               1. Evidence of prior gastrojejunostomy                           2. Otherwise unremarkable examination                           3. No abnormalities to explain pain and nausea. Recommendation:           - Patient has a contact number available for  emergencies. The signs and symptoms of potential                            delayed complications were discussed with the                            patient. Return to normal activities tomorrow.                            Written discharge instructions were provided to the                            patient.                           - Resume previous diet.                           - Continue present medications. Wilhemina Bonito. Marina Goodell, MD 07/27/2020 3:59:01 PM This report has been signed electronically.

## 2020-07-27 NOTE — Progress Notes (Signed)
PT taken to PACU. Monitors in place. VSS. Report given to RN. 

## 2020-07-27 NOTE — Progress Notes (Signed)
Medical history reviewed with no changes noted. VS assessed by C.W 

## 2020-07-31 ENCOUNTER — Telehealth: Payer: Self-pay

## 2020-07-31 NOTE — Telephone Encounter (Signed)
NO ANSWER, MESSAGE LEFT FOR PATIENT. 

## 2020-08-02 ENCOUNTER — Other Ambulatory Visit: Payer: Self-pay | Admitting: Internal Medicine

## 2020-08-02 ENCOUNTER — Encounter: Payer: Self-pay | Admitting: Neurology

## 2020-08-02 NOTE — Progress Notes (Signed)
Negin Alms (Key: [id number]) Bernita Raisin 100MG  tablets   Form [employer] Medicare Part D Electronic Prior Authorization Form (2017 NCPDP) Created 15 hours ago Sent to Plan 40 minutes ago Plan Response 37 minutes ago Submit Clinical Questions 36 minutes ago Determination Favorable 34 minutes ago Message from Plan Request Reference Number: [id number]. UBRELVY TAB 100MG  is approved through 08/04/2021. Your patient may now fill this prescription and it will be covered.

## 2020-08-11 ENCOUNTER — Ambulatory Visit: Payer: Medicare Other | Admitting: Internal Medicine

## 2020-08-19 ENCOUNTER — Encounter: Payer: Self-pay | Admitting: Internal Medicine

## 2020-08-23 DIAGNOSIS — Z20822 Contact with and (suspected) exposure to covid-19: Secondary | ICD-10-CM | POA: Diagnosis not present

## 2020-08-25 ENCOUNTER — Telehealth: Payer: Self-pay | Admitting: Internal Medicine

## 2020-08-25 NOTE — Telephone Encounter (Signed)
Pt states she feels fine at the moment and will call back if symptoms worsen.

## 2020-08-25 NOTE — Telephone Encounter (Signed)
Patient called and said that she tested positive for Covid 19 on 08/25/20. Please call the patient back at [phone].

## 2020-08-29 ENCOUNTER — Other Ambulatory Visit: Payer: Self-pay | Admitting: Physician Assistant

## 2020-08-30 ENCOUNTER — Encounter: Payer: Self-pay | Admitting: Internal Medicine

## 2020-08-30 NOTE — Telephone Encounter (Signed)
LVM instructing pt to call to schedule virtual visit.

## 2020-08-31 ENCOUNTER — Encounter: Payer: Self-pay | Admitting: Internal Medicine

## 2020-08-31 ENCOUNTER — Telehealth (INDEPENDENT_AMBULATORY_CARE_PROVIDER_SITE_OTHER): Payer: Medicare Other | Admitting: Internal Medicine

## 2020-08-31 ENCOUNTER — Telehealth (INDEPENDENT_AMBULATORY_CARE_PROVIDER_SITE_OTHER): Payer: Medicare Other | Admitting: Psychiatry

## 2020-08-31 ENCOUNTER — Other Ambulatory Visit: Payer: Self-pay

## 2020-08-31 DIAGNOSIS — F639 Impulse disorder, unspecified: Secondary | ICD-10-CM | POA: Diagnosis not present

## 2020-08-31 DIAGNOSIS — U071 COVID-19: Secondary | ICD-10-CM

## 2020-08-31 DIAGNOSIS — F5000 Anorexia nervosa, unspecified: Secondary | ICD-10-CM

## 2020-08-31 DIAGNOSIS — F3181 Bipolar II disorder: Secondary | ICD-10-CM | POA: Diagnosis not present

## 2020-08-31 MED ORDER — LAMOTRIGINE 150 MG PO TABS: ORAL_TABLET | ORAL | 0 refills | Status: DC

## 2020-08-31 MED ORDER — PREDNISONE 50 MG PO TABS: 50.0000 mg | ORAL_TABLET | Freq: Every day | ORAL | 0 refills | Status: AC

## 2020-08-31 MED ORDER — MIRTAZAPINE 15 MG PO TABS: 15.0000 mg | ORAL_TABLET | Freq: Every day | ORAL | 1 refills | Status: DC

## 2020-08-31 NOTE — Assessment & Plan Note (Signed)
Rx prednisone to help with congestion and fatigue and poor appetite. She is outside the recommended quarantine period at this time.

## 2020-08-31 NOTE — Progress Notes (Signed)
Virtual Visit via Telephone Note  I connected with Karen Hahn on 08/31/20 at  3:00 PM EST by telephone and verified that I am speaking with the correct person using two identifiers.  Location: Patient: home Provider: office   I discussed the limitations, risks, security and privacy concerns of performing an evaluation and management service by telephone and the availability of in person appointments. I also discussed with the patient that there may be a patient responsible charge related to this service. The patient expressed understanding and agreed to proceed.   History of Present Illness: Karen Hahn states she has COVID. She is having a lot of chest congestion and loss of appetite. She was started on Prednisone. Due to ongoing weight loss due to lack of appetite she wants to start Remeron. Over the course of 1 last year she has been steadily losing weight. [employer] sent someone to do an annual exam and noted weight loss. About 2 weeks before her period starts she has increase in irritability. It improves when her menstrual cycle ends. Karen Hahn denies SI/HI. Karen Hahn denies any manic or hypomanic like episodes.    Observations/Objective:  General Appearance: unable to assess  Eye Contact:  unable to assess  Speech:  Clear and Coherent and Normal Rate  Volume:  Normal  Mood:  Euthymic  Affect:  Full Range  Thought Process:  Goal Directed, Linear and Descriptions of Associations: Intact  Orientation:  Full (Time, Place, and Person)  Thought Content:  Logical  Suicidal Thoughts:  No  Homicidal Thoughts:  No  Memory:  Immediate;   Good  Judgement:  Good  Insight:  Good  Psychomotor Activity: unable to assess  Concentration:  Concentration: Good  Recall:  Good  Fund of Knowledge:  Good  Language:  Good  Akathisia:  unable to assess  Handed:  Right  AIMS (if indicated):     Assets:  Communication Skills Desire for Improvement Financial Resources/Insurance Housing Leisure  Time Resilience Social Support Talents/Skills Transportation Vocational/Educational  ADL's:  unable to assess  Cognition:  WNL  Sleep:         I reviewed the information below on 08/31/20 and have updated it.  Assessment and Plan:  MDD- recurrent, severe w/o psychotic features vs Bipolar 2 d/o Anorexia nervosa Impulse control d/o GAD ADHD Insomnia Cannabis use d/o   Lamictal 300mg  po qD- no refill today as she just picked up a bottle and has one refill pending  Start Remeron 15mg  po qHS for GAD, anorexia and insomnia   Pt is managing symptoms of ADHD    Follow Up Instructions: In 2-3 months or sooner if needed   I discussed the assessment and treatment plan with the patient. The patient was provided an opportunity to ask questions and all were answered. The patient agreed with the plan and demonstrated an understanding of the instructions.   The patient was advised to call back or seek an in-person evaluation if the symptoms worsen or if the condition fails to improve as anticipated.  I provided 16 minutes of non-face-to-face time during this encounter.   , MD

## 2020-08-31 NOTE — Assessment & Plan Note (Signed)
Rx remeron to help with appetite and follow up with PCP.

## 2020-08-31 NOTE — Progress Notes (Signed)
Virtual Visit via Video Note  I connected with Karen Hahn on 08/31/20 at  2:40 PM EST by a video enabled telemedicine application and verified that I am speaking with the correct person using two identifiers.  The patient and the provider were at separate locations throughout the entire encounter. Patient location: home, Provider location: work   I discussed the limitations of evaluation and management by telemedicine and the availability of in person appointments. The patient expressed understanding and agreed to proceed. The patient and the provider were the only parties present for the visit unless noted in HPI below.  History of Present Illness: The patient is a 45 y.o. female with visit for covid-19 positive. Friend sick 08/09/20, she started getting sick 08/16/20. Tested 08/23/20. Fever to 101.55F. Took dayquil. Some congestion but no significant cough. Mild sore throat which is intermittent. Some headaches which are worse than usual headaches. Poor appetite. Severe fatigue. Overall it is not improving much. Has tried dayquil which did not help. Had home visit through insurance prior to covid-19 and recommended remeron for appetite as she has had appetite problems even prior to covid-19 and with history of eating disorder does not want this to get out of control.  Observations/Objective: Appearance: normal, breathing appears normal, no coughing or dyspnea during visit, casual grooming, abdomen does not appear distended, throat norma, mental status is A and O times 3  Assessment and Plan: See problem oriented charting  Follow Up Instructions: rx prednisone 5 days, remeron rx  I discussed the assessment and treatment plan with the patient. The patient was provided an opportunity to ask questions and all were answered. The patient agreed with the plan and demonstrated an understanding of the instructions.   The patient was advised to call back or seek an in-person evaluation if the symptoms  worsen or if the condition fails to improve as anticipated.  Myrlene Broker, MD

## 2020-09-01 ENCOUNTER — Telehealth: Payer: Medicare Other | Admitting: Internal Medicine

## 2020-09-01 NOTE — Telephone Encounter (Signed)
Pt sending COVID test result as an FYI.

## 2020-09-05 ENCOUNTER — Other Ambulatory Visit: Payer: Self-pay | Admitting: Neurology

## 2020-09-05 DIAGNOSIS — Z20822 Contact with and (suspected) exposure to covid-19: Secondary | ICD-10-CM | POA: Diagnosis not present

## 2020-09-12 ENCOUNTER — Telehealth (HOSPITAL_COMMUNITY): Payer: Self-pay | Admitting: *Deleted

## 2020-09-12 ENCOUNTER — Encounter: Payer: Self-pay | Admitting: Internal Medicine

## 2020-09-12 ENCOUNTER — Other Ambulatory Visit: Payer: Self-pay

## 2020-09-12 ENCOUNTER — Ambulatory Visit (INDEPENDENT_AMBULATORY_CARE_PROVIDER_SITE_OTHER): Payer: Medicare Other | Admitting: Internal Medicine

## 2020-09-12 VITALS — BP 116/62 | HR 101 | Temp 98.0°F | Ht 63.0 in | Wt 98.0 lb

## 2020-09-12 DIAGNOSIS — F32A Depression, unspecified: Secondary | ICD-10-CM | POA: Diagnosis not present

## 2020-09-12 DIAGNOSIS — U071 COVID-19: Secondary | ICD-10-CM | POA: Diagnosis not present

## 2020-09-12 DIAGNOSIS — F411 Generalized anxiety disorder: Secondary | ICD-10-CM

## 2020-09-12 DIAGNOSIS — R634 Abnormal weight loss: Secondary | ICD-10-CM

## 2020-09-12 DIAGNOSIS — Z0001 Encounter for general adult medical examination with abnormal findings: Secondary | ICD-10-CM | POA: Diagnosis not present

## 2020-09-12 MED ORDER — PAROXETINE HCL 10 MG PO TABS: 10.0000 mg | ORAL_TABLET | Freq: Every day | ORAL | 3 refills | Status: DC

## 2020-09-12 NOTE — Assessment & Plan Note (Signed)
Age and sex appropriate education and counseling updated with regular exercise and diet Referrals for preventative services - pt to call for GYN f/u appt Immunizations addressed - none needed Smoking counseling  - none needed Evidence for depression or other mood disorder - none significant Most recent labs reviewed. I have personally reviewed and have noted: 1) the patient's medical and social history 2) The patient's current medications and supplements 3) The patient's height, weight, and BMI have been recorded in the chart

## 2020-09-12 NOTE — Assessment & Plan Note (Signed)
Ok for [employer] and flonase asd prn,  to f/u any worsening symptoms or concerns

## 2020-09-12 NOTE — Assessment & Plan Note (Signed)
Stable, declines SI or HI

## 2020-09-12 NOTE — Assessment & Plan Note (Signed)
Chronic persistent, has hx of anorexia nervosa, declines referral counseling

## 2020-09-12 NOTE — Assessment & Plan Note (Signed)
With wt loss, ok for trial paxil 10 qd, consdier increased to 20 in 3-4 wks

## 2020-09-12 NOTE — Telephone Encounter (Signed)
Pt called stating that her PCP has d/c the Remeron and replaced it with Paxil. Pt is unsure about this change and said she wanted you to handle her Antidepressants. She has an upcoming appointment on 10/05/20. She would like a phone call from you.

## 2020-09-12 NOTE — Progress Notes (Signed)
Patient ID: Karen Hahn, female   DOB: [birth year], 45 y.o.   MRN: [id number]           Chief Complaint:: wellness exam and Follow-up wt loss, and recent post covid infection       HPI:  Karen Hahn is a 45 y.o. female here for wellness exam, due for pap and intends to call for GYN appt   Wt Readings from Last 3 Encounters:  09/12/20 98 lb (44.5 kg)  07/27/20 97 lb (44 kg)  07/11/20 97 lb (44 kg)   BP Readings from Last 3 Encounters:  09/12/20 116/62  07/27/20 100/62  07/11/20 90/62   Immunization History  Administered Date(s) Administered  . Influenza-Unspecified 06/02/2017  . Tdap 05/21/2017   Health Maintenance Due  Topic Date Due  . PAP SMEAR-Modifier  09/19/2020         Also cant seem to gain wt, started on remeron per recent visit but has not worked so far, asks for change, Denies worsening depressive symptoms, suicidal ideation, or panic; has ongoing anxiety, also increased recently. Also had recent covid infection now essentialy resolved except for sinus congestion.    Past Medical History:  Diagnosis Date  . ADD (attention deficit disorder)   . Allergy   . Anorexia nervosa without bulimia   . Chronic constipation   . Drug abuse (HCC)   . GAD (generalized anxiety disorder)   . GERD (gastroesophageal reflux disease)   . History of gastric restrictive surgery   . History of gastric ulcer   . History of Helicobacter pylori infection    after 2003  . HLD (hyperlipidemia) 02/16/2018  . Impulse control disorder   . Moderate major depression (HCC)   . Osteopenia   . Osteoporosis   . Post concussion syndrome    fell 10/20/2015  . SMAS (superior mesenteric artery syndrome) (HCC)   . Urge urinary incontinence    Past Surgical History:  Procedure Laterality Date  . DILATION AND CURETTAGE OF UTERUS  2005  . INTERSTIM IMPLANT PLACEMENT N/A 10/18/2015   Procedure: [name] IMPLANT FIRST STAGE;  Surgeon: Jerilee Field, MD;  Location: [facility];  Service: Urology;  Laterality: N/A;  . INTERSTIM IMPLANT PLACEMENT N/A 10/18/2015   Procedure: [name] IMPLANT SECOND STAGE;  Surgeon: Jerilee Field, MD;  Location: [facility];  Service: Urology;  Laterality: N/A;  . LAPAROSCOPIC GASTRIC RESTRICTIVE DUODENAL PROCEDURE (DUODENAL SWITCH)  12/ 2003  . ORIF RIGHT WRIST FX  2013   REMOVAL HARDWARE X2 in  2013--  No retained hardware    reports that she quit smoking about 3 years ago. Her smoking use included cigarettes. She has a 0.50 pack-year smoking history. She has never used smokeless tobacco. She reports that she does not drink alcohol and does not use drugs. family history includes ADD / ADHD in her brother; Alcohol abuse in her maternal uncle and mother; Anxiety disorder in her father, maternal grandmother, and mother; Breast cancer (age of onset: 51) in her paternal grandmother; COPD in her father and mother; Cancer in her mother; Depression in her maternal grandmother and mother; Drug abuse in her father; Stroke in her mother. Allergies  Allergen Reactions  . Bactrim [Sulfamethoxazole-Trimethoprim] Other (See Comments)    Yeast infection  . Penicillins Other (See Comments)    Has patient had a PCN reaction causing immediate rash, facial/tongue/throat swelling, SOB or lightheadedness with hypotension: NO (UTI, YEAST INFECTION) Has patient had a PCN reaction causing severe rash  involving mucus membranes or skin necrosis: NO Has patient had a PCN reaction that required hospitalization NO Has patient had a PCN reaction occurring within the last 10 years: NO If all of the above answers are "NO", then may proceed with Cephalosporin use.   . Ciprofloxacin Itching  . Morphine And Related Itching and Other (See Comments)    "whole body feels tight feeling"  . Reglan [Metoclopramide]   . Benadryl [Diphenhydramine Hcl] Palpitations  . Diflucan [Fluconazole] Rash   Current Outpatient Medications on File Prior to Visit   Medication Sig Dispense Refill  . Calcium-Iron-Vit D-Vit K [id number] MG-UNT-MCG CHEW Chew by mouth daily.    Marland Kitchen dicyclomine (BENTYL) 10 MG capsule TAKE 1 CAPSULE BY MOUTH FOUR TIMES DAILY BEFORE MEALS AND AT BEDTIME 120 capsule 5  . Erenumab-aooe (AIMOVIG) 140 MG/ML SOAJ Inject 140 mg into the skin every 30 (thirty) days. 1 pen 11  . ferrous gluconate (FERGON) 324 MG tablet Take 1 tablet (324 mg total) by mouth daily with breakfast. 90 tablet 3  . lamoTRIgine (LAMICTAL) 150 MG tablet TAKE 2 TABLETS(300 MG) BY MOUTH DAILY 180 tablet 0  . Multiple Vitamins-Minerals (MULTIVITAMIN GUMMIES ADULT) CHEW Chew by mouth.    . naproxen (NAPROSYN) 500 MG tablet Take 1 tablet (500 mg total) by mouth 2 (two) times daily as needed. 20 tablet 3  . ondansetron (ZOFRAN) 4 MG tablet TAKE 1 TABLET(4 MG) BY MOUTH EVERY 6 HOURS AS NEEDED FOR NAUSEA OR VOMITING 30 tablet 2  . pantoprazole (PROTONIX) 40 MG tablet TAKE 1 TABLET BY MOUTH EVERY MORNING 90 tablet 0  . promethazine (PHENERGAN) 25 MG tablet TAKE 1 TABLET(25 MG) BY MOUTH TWICE DAILY AS NEEDED 60 tablet 2  . rizatriptan (MAXALT-MLT) 10 MG disintegrating tablet TAKE 1 TABLET BY MOUTH AS NEEDED FOR MIGRANE. MAY REPEAT IN 2 HOURS IF NEEDED 10 tablet 2  . rosuvastatin (CRESTOR) 20 MG tablet TAKE 1 TABLET(20 MG) BY MOUTH DAILY 90 tablet 3  . UBRELVY 100 MG TABS TAKE 1 TABLET BY MOUTH AS NEEDED(MAY REPEAT DOSE AFTER 2 HOURS) MAXIMUM 2 TABLETS IN 24 HOURS 16 tablet 0  . hydrocortisone (ANUSOL-HC) 2.5 % rectal cream Place 1 application rectally 2 (two) times daily. (Patient not taking: Reported on 07/27/2020) 30 g 0  . ibuprofen (ADVIL) 600 MG tablet Take by mouth.     No current facility-administered medications on file prior to visit.        ROS:  All others reviewed and negative.  Objective        PE:  BP 116/62   Pulse (!) 101   Temp 98 F (36.7 C) (Oral)   Ht 5\' 3"  (1.6 m)   Wt 98 lb (44.5 kg)   SpO2 98%   BMI 17.36 kg/m                  Constitutional: Pt appears in NAD               HENT: Head: NCAT.                Right Ear: External ear normal.                 Left Ear: External ear normal.                Eyes: . Pupils are equal, round, and reactive to light. Conjunctivae and EOM are normal  Nose: without d/c or deformity               Neck: Neck supple. Gross normal ROM               Cardiovascular: Normal rate and regular rhythm.                 Pulmonary/Chest: Effort normal and breath sounds without rales or wheezing.                Abd:  Soft, NT, ND, + BS, no organomegaly               Neurological: Pt is alert. At baseline orientation, motor grossly intact               Skin: Skin is warm. No rashes, no other new lesions, LE edema - none               Psychiatric: Pt behavior is normal without agitation   Micro: none  Cardiac tracings I have personally interpreted today:  none  Pertinent Radiological findings (summarize): none   Lab Results  Component Value Date   WBC 7.0 04/20/2020   HGB 12.2 04/20/2020   HCT 36.1 04/20/2020   PLT 241 04/20/2020   GLUCOSE 63 (L) 02/21/2020   CHOL 180 02/21/2020   TRIG 93.0 02/21/2020   HDL 56.20 02/21/2020   LDLCALC 105 (H) 02/21/2020   ALT 42 (H) 02/21/2020   AST 33 02/21/2020   NA 139 02/21/2020   K 4.1 02/21/2020   CL 105 02/21/2020   CREATININE 0.73 02/21/2020   BUN 10 02/21/2020   CO2 30 02/21/2020   TSH 1.76 01/07/2020   HGBA1C 5.1 02/18/2019   Assessment/Plan:  Karen Hahn is a 45 y.o. White or Caucasian [1] female with  has a past medical history of ADD (attention deficit disorder), Allergy, Anorexia nervosa without bulimia, Chronic constipation, Drug abuse (HCC), GAD (generalized anxiety disorder), GERD (gastroesophageal reflux disease), History of gastric restrictive surgery, History of gastric ulcer, History of Helicobacter pylori infection, HLD (hyperlipidemia) (02/16/2018), Impulse control disorder, Moderate major depression (HCC),  Osteopenia, Osteoporosis, Post concussion syndrome, SMAS (superior mesenteric artery syndrome) (HCC), and Urge urinary incontinence. Encounter for well adult exam with abnormal findings Age and sex appropriate education and counseling updated with regular exercise and diet Referrals for preventative services - pt to call for GYN f/u appt Immunizations addressed - none needed Smoking counseling  - none needed Evidence for depression or other mood disorder - none significant Most recent labs reviewed. I have personally reviewed and have noted: 1) the patient's medical and social history 2) The patient's current medications and supplements 3) The patient's height, weight, and BMI have been recorded in the chart   Weight loss Chronic persistent, has hx of anorexia nervosa, declines referral counseling  GAD (generalized anxiety disorder) With wt loss, ok for trial paxil 10 qd, consdier increased to 20 in 3-4 wks  Depression Stable, declines SI or HI  COVID Ok for mucinex and flonase asd prn,  to f/u any worsening symptoms or concerns  Followup: Return in about 6 months (around 03/12/2021).  Oliver Barre, MD 09/12/2020 8:24 PM [facility] - [facility] Internal Medicine

## 2020-09-12 NOTE — Patient Instructions (Signed)
Please take all new medication as prescribed - the paxil at 10 mg per day  Please call if you want to increase the dose to 20 mg in about 3-4 wks  Please continue all other medications as before, including the muxinex and flonase as needed post covid  Please have the pharmacy call with any other refills you may need.  Please continue your efforts at being more active, low cholesterol diet, and weight control.  Please keep your appointments with your specialists as you may have planned

## 2020-09-13 ENCOUNTER — Other Ambulatory Visit: Payer: Self-pay | Admitting: Internal Medicine

## 2020-09-13 DIAGNOSIS — F5000 Anorexia nervosa, unspecified: Secondary | ICD-10-CM

## 2020-09-13 DIAGNOSIS — R634 Abnormal weight loss: Secondary | ICD-10-CM

## 2020-09-15 ENCOUNTER — Telehealth (HOSPITAL_COMMUNITY): Payer: Self-pay | Admitting: *Deleted

## 2020-09-15 NOTE — Telephone Encounter (Signed)
Pt called stating that her PCP wants to d/c Remeron and she is still losing weight, and replace with Paxil. Pt is seeking advice from you as to wether or not you agree with change. Please review and advise.

## 2020-09-18 ENCOUNTER — Ambulatory Visit (INDEPENDENT_AMBULATORY_CARE_PROVIDER_SITE_OTHER): Payer: Medicare Other | Admitting: Physician Assistant

## 2020-09-18 ENCOUNTER — Other Ambulatory Visit (INDEPENDENT_AMBULATORY_CARE_PROVIDER_SITE_OTHER): Payer: Medicare Other

## 2020-09-18 ENCOUNTER — Encounter: Payer: Self-pay | Admitting: Physician Assistant

## 2020-09-18 VITALS — BP 84/62 | HR 105 | Ht 63.0 in | Wt 99.0 lb

## 2020-09-18 DIAGNOSIS — R112 Nausea with vomiting, unspecified: Secondary | ICD-10-CM

## 2020-09-18 DIAGNOSIS — R634 Abnormal weight loss: Secondary | ICD-10-CM | POA: Diagnosis not present

## 2020-09-18 DIAGNOSIS — R1010 Upper abdominal pain, unspecified: Secondary | ICD-10-CM | POA: Diagnosis not present

## 2020-09-18 DIAGNOSIS — R110 Nausea: Secondary | ICD-10-CM | POA: Diagnosis not present

## 2020-09-18 LAB — CBC WITH DIFFERENTIAL/PLATELET
Basophils Absolute: 0.1 10*3/uL (ref 0.0–0.1)
Basophils Relative: 0.7 % (ref 0.0–3.0)
Eosinophils Absolute: 0.1 10*3/uL (ref 0.0–0.7)
Eosinophils Relative: 1.8 % (ref 0.0–5.0)
HCT: 35.3 % — ABNORMAL LOW (ref 36.0–46.0)
Hemoglobin: 12 g/dL (ref 12.0–15.0)
Lymphocytes Relative: 31.3 % (ref 12.0–46.0)
Lymphs Abs: 2.3 10*3/uL (ref 0.7–4.0)
MCHC: 34 g/dL (ref 30.0–36.0)
MCV: 87.8 fl (ref 78.0–100.0)
Monocytes Absolute: 0.4 10*3/uL (ref 0.1–1.0)
Monocytes Relative: 6.1 % (ref 3.0–12.0)
Neutro Abs: 4.4 10*3/uL (ref 1.4–7.7)
Neutrophils Relative %: 60.1 % (ref 43.0–77.0)
Platelets: 259 10*3/uL (ref 150.0–400.0)
RBC: 4.02 Mil/uL (ref 3.87–5.11)
RDW: 12.8 % (ref 11.5–15.5)
WBC: 7.4 10*3/uL (ref 4.0–10.5)

## 2020-09-18 LAB — COMPREHENSIVE METABOLIC PANEL
ALT: 29 U/L (ref 0–35)
AST: 25 U/L (ref 0–37)
Albumin: 4.5 g/dL (ref 3.5–5.2)
Alkaline Phosphatase: 71 U/L (ref 39–117)
BUN: 13 mg/dL (ref 6–23)
CO2: 29 mEq/L (ref 19–32)
Calcium: 9.4 mg/dL (ref 8.4–10.5)
Chloride: 103 mEq/L (ref 96–112)
Creatinine, Ser: 0.63 mg/dL (ref 0.40–1.20)
GFR: 109.63 mL/min (ref >60.00)
Glucose, Bld: 96 mg/dL (ref 70–99)
Potassium: 4.6 mEq/L (ref 3.5–5.1)
Sodium: 137 mEq/L (ref 135–145)
Total Bilirubin: 0.5 mg/dL (ref 0.2–1.2)
Total Protein: 7.3 g/dL (ref 6.0–8.3)

## 2020-09-18 NOTE — Patient Instructions (Addendum)
If you are age 43 or older, your body mass index should be between 23-30. Your Body mass index is 17.54 kg/m. If this is out of the aforementioned range listed, please consider follow up with your Primary Care Provider.  If you are age 83 or younger, your body mass index should be between 19-25. Your Body mass index is 17.54 kg/m. If this is out of the aformentioned range listed, please consider follow up with your Primary Care Provider.   Please go to the lab in the basement of our building to have lab work done as you leave today. Hit "B" for basement when you get on the elevator.  When the doors open the lab is on your left.  We will call you with the results. Thank you.  Due to recent changes in healthcare laws, you may see the results of your imaging and laboratory studies on MyChart before your provider has had a chance to review them.  We understand that in some cases there may be results that are confusing or concerning to you. Not all laboratory results come back in the same time frame and the provider may be waiting for multiple results in order to interpret others.  Please give [place] 48 hours in order for your provider to thoroughly review all the results before contacting the office for clarification of your results.  _________________________________________________________________________  Karen Hahn have been scheduled for a CT scan of the abdomen and pelvis at [facility], 1st floor Radiology. You are scheduled on Tuesday, 2-22 at 3:30pm. You should arrive 15 minutes prior to your appointment time for registration. Please follow the written instructions below on the day of your exam:  WARNING: IF YOU ARE ALLERGIC TO IODINE/X-RAY DYE, PLEASE NOTIFY RADIOLOGY IMMEDIATELY AT [phone]! YOU WILL BE GIVEN A 13 HOUR PREMEDICATION PREP.  1) Do not eat or drink anything after 11:30am (4 hours prior to your test) 2) You will need to pick up 2 bottles of prep at [facility] at least 3 days  prior to your test. The solution may taste better if refrigerated, but do NOT add ice or any other liquid to this solution. Shake well before drinking.    Drink 1 bottle of contrast @ 1:30pm (2 hours prior to your exam)  Drink 1 bottle of contrast @ 2:30pm (1 hour prior to your exam)  You may take any medications as prescribed with a small amount of water, if necessary. If you take any of the following medications: METFORMIN, GLUCOPHAGE, GLUCOVANCE, AVANDAMET, RIOMET, FORTAMET, ACTOPLUS MET, JANUMET, GLUMETZA or METAGLIP, you MAY be asked to HOLD this medication 48 hours AFTER the exam.  The purpose of you drinking the oral contrast is to aid in the visualization of your intestinal tract. The contrast solution may cause some diarrhea. Depending on your individual set of symptoms, you may also receive an intravenous injection of x-ray contrast/dye. Plan on being at [facility] for 30 minutes or longer, depending on the type of exam you are having performed.  This test typically takes 30-45 minutes to complete.  If you have any questions regarding your exam or if you need to reschedule, you may call the CT department at [phone] between the hours of 8:00 am and 5:00 pm, Monday-Friday.  ________________________________________________________________________\  Continue Promethazine 25 mg every morning.  Continue Zofran 4 mg every 6 hours as needed.  Continue Protonix 40 mg every morning.  Continue Bentyl 10 mg every 6 hours as needed.   Thank you  for entrusting me with your care and for choosing [employer] [employer].

## 2020-09-18 NOTE — Progress Notes (Signed)
Subjective:    Patient ID: Karen Hahn, female    DOB: 1976-01-04, 45 y.o.   MRN: [id number]  HPI Karen Hahn is a 45 year old female, established with Dr. Henrene Pastor who comes in today for follow-up of abdominal pain, and nausea. She has history of GERD, ADD, generalized anxiety disorder, depression, IBS and anorexia nervosa. She is status post remote laparoscopic duodenal switch procedure done in 2003 elsewhere for SMA syndrome. She was seen in our office in December 2021 by Ellouise Newer, PA-C with complaints of nausea and vomiting. She underwent EGD per Dr. Henrene Pastor December 2021 with finding of prior gastrojejunostomy with patent anastomosis and normal limbs. She says she is controlling the nausea fairly well with use of Phenergan 25 mg p.o. every morning and Zofran periodically throughout the day.  She says most days she is just requiring 1 Zofran.  She is not having any further vomiting but says if she was not taking the antiemetics regularly she feels as if she would be having persistent vomiting. She has chronically been on dicyclomine for years and continues on that. She is concerned because she has continued to lose some weight, and feels her weight overall is down about 10 pounds in the past 6 months.  She says she is eating 3 meals per day and usually has some small snacks at nighttime.  Her weight was 97 pounds in December and is 99 pounds today.  She reports a recent episode of "bad crampy and sharp abdominal pain" This lasted for 5 or 6 hours and then gradually resolved.  She had a sensation of a feeling of trapped gas that she was unable to relieve.  She has not had recurrence of this pain fortunately. Charts lists history of cannabis abuse, patient says she has not been using marijuana over the past 3 years.  She has also been having ongoing issues with anxiety though she does not feel that her nausea is related to anxiety.  She had recently started on Remeron.  She is awaiting  upcoming follow-up with her psychiatrist.  She had also been given a prescription for Paxil by Dr. Jenny Reichmann but says she has not started that as she wanted to discuss with her psychiatrist.  Review of Systems Pertinent positive and negative review of systems were noted in the above HPI section.  All other review of systems was otherwise negative.  Outpatient Encounter Medications as of 09/18/2020  Medication Sig  . Calcium-Iron-Vit D-Vit K [id number] MG-UNT-MCG CHEW Chew by mouth daily.  Marland Kitchen dicyclomine (BENTYL) 10 MG capsule TAKE 1 CAPSULE BY MOUTH FOUR TIMES DAILY BEFORE MEALS AND AT BEDTIME (Patient taking differently: 20 mg. Take 1 capsule by mouth four times daily before meals and at bedtime.)  . Erenumab-aooe (AIMOVIG) 140 MG/ML SOAJ Inject 140 mg into the skin every 30 (thirty) days.  . ferrous gluconate (FERGON) 324 MG tablet Take 1 tablet (324 mg total) by mouth daily with breakfast.  . ibuprofen (ADVIL) 600 MG tablet Take by mouth every 6 (six) hours as needed.  . lamoTRIgine (LAMICTAL) 150 MG tablet TAKE 2 TABLETS(300 MG) BY MOUTH DAILY  . mirtazapine (REMERON) 15 MG tablet Take 15 mg by mouth at bedtime.  . Multiple Vitamins-Minerals (MULTIVITAMIN GUMMIES ADULT) CHEW Chew by mouth.  . naproxen (NAPROSYN) 500 MG tablet Take 1 tablet (500 mg total) by mouth 2 (two) times daily as needed.  . ondansetron (ZOFRAN) 4 MG tablet TAKE 1 TABLET(4 MG) BY MOUTH EVERY 6 HOURS AS NEEDED FOR NAUSEA  OR VOMITING  . pantoprazole (PROTONIX) 40 MG tablet TAKE 1 TABLET BY MOUTH EVERY MORNING  . promethazine (PHENERGAN) 25 MG tablet TAKE 1 TABLET(25 MG) BY MOUTH TWICE DAILY AS NEEDED  . rizatriptan (MAXALT-MLT) 10 MG disintegrating tablet TAKE 1 TABLET BY MOUTH AS NEEDED FOR MIGRANE. MAY REPEAT IN 2 HOURS IF NEEDED  . rosuvastatin (CRESTOR) 20 MG tablet TAKE 1 TABLET(20 MG) BY MOUTH DAILY  . UBRELVY 100 MG TABS TAKE 1 TABLET BY MOUTH AS NEEDED(MAY REPEAT DOSE AFTER 2 HOURS) MAXIMUM 2 TABLETS IN 24 HOURS  .  PARoxetine (PAXIL) 10 MG tablet Take 1 tablet (10 mg total) by mouth daily. (Patient not taking: Reported on 09/18/2020)  . [DISCONTINUED] hydrocortisone (ANUSOL-HC) 2.5 % rectal cream Place 1 application rectally 2 (two) times daily. (Patient not taking: Reported on 07/27/2020)   No facility-administered encounter medications on file as of 09/18/2020.   Allergies  Allergen Reactions  . Bactrim [Sulfamethoxazole-Trimethoprim] Other (See Comments)    Yeast infection  . Penicillins Other (See Comments)    Has patient had a PCN reaction causing immediate rash, facial/tongue/throat swelling, SOB or lightheadedness with hypotension: NO (UTI, YEAST INFECTION) Has patient had a PCN reaction causing severe rash involving mucus membranes or skin necrosis: NO Has patient had a PCN reaction that required hospitalization NO Has patient had a PCN reaction occurring within the last 10 years: NO If all of the above answers are "NO", then may proceed with Cephalosporin use.   . Ciprofloxacin Itching  . Morphine And Related Itching and Other (See Comments)    "whole body feels tight feeling"  . Reglan [Metoclopramide]   . Benadryl [Diphenhydramine Hcl] Palpitations  . Diflucan [Fluconazole] Rash   Patient Active Problem List   Diagnosis Date Noted  . COVID 08/31/2020  . Weight loss 02/21/2020  . Right wrist pain 07/29/2019  . Right hand pain 07/29/2019  . Intractable headache 04/14/2019  . Hyperglycemia 02/18/2019  . HLD (hyperlipidemia) 02/16/2018  . Encounter for well adult exam with abnormal findings 05/21/2017  . UTI (urinary tract infection) 05/21/2017  . Thrush 05/21/2017  . Vitamin D deficiency 05/21/2017  . Osteopenia 05/21/2017  . Dyspnea 05/21/2017  . Hematuria 01/01/2017  . Lump in female breast 01/01/2017  . Fatigue 01/01/2017  . Candida vaginitis 01/01/2017  . Dysuria 12/19/2016  . Gastroesophageal reflux disease with esophagitis 11/14/2016  . Irritable bowel syndrome with  constipation 11/14/2016  . Severe episode of recurrent major depressive disorder, without psychotic features ([place]) 08/24/2015  . Cough 11/15/2014  . Major depressive disorder, recurrent episode, moderate ([place]) 07/14/2014  . ADD (attention deficit hyperactivity disorder, inattentive type) 07/14/2014  . Impulse control disorder 07/14/2014  . Depression 03/08/2014  . GAD (generalized anxiety disorder) 03/08/2014  . Anorexia nervosa 11/17/2013  . Impulse control disorder, unspecified 11/17/2013  . Cannabis abuse 11/17/2013  . Insomnia 11/17/2013   Social History   Socioeconomic History  . Marital status: Divorced    Spouse name: Not on file  . Number of children: 0  . Years of education: 67  . Highest education level: Not on file  Occupational History  . Occupation: Disability  Tobacco Use  . Smoking status: Former Smoker    Packs/day: 0.50    Years: 1.00    Pack years: 0.50    Types: Cigarettes    Quit date: 12/03/2016    Years since quitting: 3.7  . Smokeless tobacco: Never Used  . Tobacco comment: quit smoking 8 months ago as of 10/15/18  Vaping Use  . Vaping Use: Never used  Substance and Sexual Activity  . Alcohol use: No    Alcohol/week: 0.0 standard drinks  . Drug use: No    Frequency: 7.0 times per week    Types: Marijuana    Comment: hx of and last used cocaine 2012. she was smoking marjiuana 7g/week. smoking thru out the day but quit 4 months ago  . Sexual activity: Not Currently    Comment: 1st intercourse 45 yo-Fewer than 5 partners  Other Topics Concern  . Not on file  Social History Narrative   Fun: Play piano; Build Lego   Denies abuse and feels safe at home. Living with friend in [place].    Right handed    Drink coffee 1 cup eveyday   Leave with 2 friends   1 story home, 16 steps   Social Determinants of Health   Financial Resource Strain: Not on file  Food Insecurity: Not on file  Transportation Needs: Not on file  Physical Activity: Not on  file  Stress: Not on file  Social Connections: Not on file  Intimate Partner Violence: Not on file    Ms. Rosero's family history includes ADD / ADHD in her brother; Alcohol abuse in her maternal uncle and mother; Anxiety disorder in her father, maternal grandmother, and mother; Breast cancer (age of onset: 63) in her paternal grandmother; COPD in her father and mother; Cancer in her mother; Depression in her maternal grandmother and mother; Drug abuse in her father; Stroke in her mother.      Objective:    Vitals:   09/18/20 1457  BP: (!) 84/62  Pulse: (!) 105  SpO2: 98%    Physical Exam Well-developed very thin white female in no acute distress.  Height, Weight, 99  BMI 17.5  HEENT; nontraumatic normocephalic, EOMI, PE R LA, sclera anicteric. Oropharynx; not examined today Neck; supple, no JVD Cardiovascular; regular rate and rhythm with S1-S2, no murmur rub or gallop Pulmonary; Clear bilaterally Abdomen; soft, there is tenderness in the epigastrium/hypogastrium, nondistended, no palpable mass or hepatosplenomegaly, bowel sounds are active, midline incisional scar Rectal; not done today Skin; benign exam, no jaundice rash or appreciable lesions Extremities; no clubbing cyanosis or edema skin warm and dry Neuro/Psych; alert and oriented x4, grossly nonfocal mood and affect appropriate       Assessment & Plan:   #15 45 year old white female status post remote duodenal switch procedure/gastrojejunostomy 2003 for SMA syndrome now with persistent/chronic nausea over the past many months. Recent EGD - December 2021. Symptoms are controlled with ongoing use of antiemetics Etiology of persistent nausea and intermittent vomiting is not clear.  She does not have any symptoms consistent with early satiety, could consider gastroparesis. With intermittent episodes of severe crampy pain, consider intermittent obstructive symptoms, consider biliary colic. Nausea and vomiting may also be  secondary to chronic anxiety although patient does not relate symptoms to anxiety.  #2 General anxiety disorder #3.  ADD #4.  History of depression #5.  Anorexia nervosa  Plan; continue Protonix 40 mg p.o. daily for now Continue Phenergan 25 mg p.o. every morning Continue Zofran 4 mg 1-2 daily as needed Continue Bentyl 10 to 20 mg every 6 hours as needed for IBS CBC with differential, c-Met, We will proceed with CT of the abdomen pelvis with contrast. Patient has upcoming appointment with nutritionist, and psychiatrist.   Alfredia Ferguson PA-C 09/18/2020   Cc: Biagio Borg, MD

## 2020-09-20 ENCOUNTER — Encounter: Payer: Medicare Other | Attending: Internal Medicine | Admitting: Registered"

## 2020-09-20 ENCOUNTER — Encounter: Payer: Self-pay | Admitting: Registered"

## 2020-09-20 ENCOUNTER — Other Ambulatory Visit: Payer: Self-pay

## 2020-09-20 DIAGNOSIS — Z713 Dietary counseling and surveillance: Secondary | ICD-10-CM | POA: Insufficient documentation

## 2020-09-20 NOTE — Patient Instructions (Addendum)
-   Aim to have 1/2 plate of starch/grain + 1/4 plate of protein + 1/4 plate of fruit/vegetable + fat + dairy/calcium for each meal.   - Snacks include 2 food groups:   Cheese and crackers  Peanut butter crackers  Fruit and peanut butter  Yogurt + granola + fruit  1/2 peanut butter jelly sandwich  - Aim to have 2 snacks a day in addition to 3 meals.   12 pm  11 pm

## 2020-09-20 NOTE — Progress Notes (Signed)
Appointment start time: 3:10  Appointment end time: 3:50  Patient was seen on 09/20/2020 for nutrition counseling pertaining to disordered eating  Primary care provider: Oliver Barre, MD Therapist: none; has some numbers to call   ROI: N/A Any other medical team members: Chriss Driver, MD (psychiatrist)   Assessment  Pt arrives to re-establish care to prevent eating disorder from showing up again. Last visit with me was 08/2018 and pt was weighing ~104.5 lbs. States she's losing a lot of weight now. Weighed 99 lbs with clothes on at appt yesterday, 09/19/20. States she is having a lot of nausea; scheduled for a CT scan will check everything in abdomen. Taking medications daily daily, promethazine and Zofran to help with nausea. Reports sometimes she will add in chewable Pepto Bismol as needed.   States insurance covers some Ensure and Boost products for her but does not like the milky textures of them. States it feels heavy on her stomach during the day. States she likes Ensure Clear but insurance doesn't cover Clear version.   Reports she likes to snack at night. States she loves Takis although they are hot. Does not drink water, mostly drinks soda and coffee. Drinks 2 cans of soda a day.     Growth Metrics: Goal rate of weight gain: 0.5-1.0 lb/week  Eating history: Length of time: age 45 - early 101's. Previous treatments:  Goals for RD meetings:   Weight history:  Highest weight: 115 (6 years ago)    Lowest weight: 80 Most consistent weight: 105   How has weight changed in the past year: recently has dropped  Medical Information:  Changes in hair, skin, nails since ED started: no Chewing/swallowing difficulties: no Reflux or heartburn: yes, taking protonix Trouble with teeth: no LMP without the use of hormones: 2/1  Weight at that point: unsure Effect of exercise on menses: N/A   Effect of hormones on menses: N/A Constipation, diarrhea: yes, constipation has BM once a  day Dizziness/lightheadedness: no Headaches/body aches: yes, headaches; takes migraine medications Heart racing/chest pain: heart racing, increased HR due to anxiety, normal for her Mood: some depression present  Sleep:  Focus/concentration:  Cold intolerance:  Vision changes:   Mental health diagnosis: previous history of AN   Dietary assessment: A typical day consists of 3 meals and 1 snack  Safe foods include: all; not a picky eater Avoided foods include: none  24 hour recall:  B (10:30 am): [employer]-mini spinach egg wrap + 1/2 bagel + butter + orange + 1/2 c coffee S:  L (3:30 pm): 4-6" sub-ham, cheese, [place] + handful chips + 1/2 can soda S: D (8:30 pm): can of beef ravioli + salad (vegetables, cheese, italian dressing)  S (11 pm): cheese and crackers   Beverages: coffee, soda (2*12 oz; 24 oz)  What Methods Do You Use To Control Your Weight (Compensatory behaviors)?  none  Estimated energy intake: 1700-1800 kcal  Estimated energy needs: 2000-2400 kcal 250-300 g CHO 100-120 g pro 67-80 g fat  Nutrition Diagnosis: NB-1.5 Disordered eating pattern As related to history of anorexia nervosa.  As evidenced by pt reported.  Intervention/Goals: Pt was educated and counseled on eating to nourish the body, ways to increase nourishment, and meal/snack planning. Discussed how to have snack during the day along with bedtime snack. Discussed Takis potentially being a trigger for GI discomfort/acid reflux. Discussed having balanced meals to help restore pt's body using plate-by-plate method. Pt was in agreement with goals listed. Goals: - Aim  to have 1/2 plate of starch/grain + 1/4 plate of protein + 1/4 plate of fruit/vegetable + fat + dairy/calcium for each meal.  - Snacks include 2 food groups:   Cheese and crackers  Peanut butter crackers  Fruit and peanut butter  Yogurt + granola + fruit  1/2 peanut butter jelly sandwich - Aim to have 2 snacks a day in  addition to 3 meals.   12 pm  11 pm  Meal plan:    3 meals    2 snacks   Monitoring and Evaluation: Patient will follow up in 3 weeks.

## 2020-09-21 ENCOUNTER — Other Ambulatory Visit: Payer: Self-pay

## 2020-09-21 ENCOUNTER — Telehealth: Payer: Self-pay | Admitting: Physician Assistant

## 2020-09-21 MED ORDER — TRAMADOL HCL 50 MG PO TABS
50.0000 mg | ORAL_TABLET | Freq: Four times a day (QID) | ORAL | 0 refills | Status: DC | PRN
Start: 2020-09-21 — End: 2020-10-03

## 2020-09-21 NOTE — Telephone Encounter (Signed)
yes

## 2020-09-21 NOTE — Telephone Encounter (Signed)
She has been having episodes of pain, is scheduled for CT of the abdomen pelvis next week.  I am okay giving her a few tramadol-1 p.o. every 6 hours as needed for severe pain #15 and no refills -should be able to fax this.  I would probably back off to a liquid diet for 12 to 24 hours, then gradually increase.

## 2020-09-21 NOTE — Telephone Encounter (Signed)
Inbound call from patient requesting a call back from a nurse please; states she is experiencing severe abdominal pain.

## 2020-09-21 NOTE — Telephone Encounter (Signed)
Spoke with the patient. She is experiencing cramping sharp pain in her lower abdomen into her back. She has an urge to move her bowels but nothing is happening. She had a normal bowel movement yesterday. She is eating a bagel, a spinach wrap egg and OJ right now. No nausea. No change in the pain with taking Bentyl.

## 2020-09-21 NOTE — Telephone Encounter (Signed)
Patient advised. She wants to be certain this not a narcotic. Very concerned about addiction potential .  Pharmacy confirmed. Rx faxed to [facility]. She will switch to liquids as advised.

## 2020-09-25 DIAGNOSIS — M419 Scoliosis, unspecified: Secondary | ICD-10-CM | POA: Diagnosis not present

## 2020-09-25 DIAGNOSIS — M25539 Pain in unspecified wrist: Secondary | ICD-10-CM | POA: Diagnosis not present

## 2020-09-25 DIAGNOSIS — M8589 Other specified disorders of bone density and structure, multiple sites: Secondary | ICD-10-CM | POA: Diagnosis not present

## 2020-09-25 DIAGNOSIS — M8580 Other specified disorders of bone density and structure, unspecified site: Secondary | ICD-10-CM | POA: Diagnosis not present

## 2020-09-25 DIAGNOSIS — E785 Hyperlipidemia, unspecified: Secondary | ICD-10-CM | POA: Diagnosis not present

## 2020-09-25 DIAGNOSIS — M1990 Unspecified osteoarthritis, unspecified site: Secondary | ICD-10-CM | POA: Diagnosis not present

## 2020-09-25 DIAGNOSIS — K219 Gastro-esophageal reflux disease without esophagitis: Secondary | ICD-10-CM | POA: Diagnosis not present

## 2020-09-26 ENCOUNTER — Other Ambulatory Visit: Payer: Self-pay

## 2020-09-26 ENCOUNTER — Ambulatory Visit (HOSPITAL_COMMUNITY)
Admission: RE | Admit: 2020-09-26 | Discharge: 2020-09-26 | Disposition: A | Discharge status: Home / Self Care | Payer: Medicare Other | Source: Ambulatory Visit | Attending: Physician Assistant | Admitting: Physician Assistant

## 2020-09-26 DIAGNOSIS — R1013 Epigastric pain: Secondary | ICD-10-CM | POA: Insufficient documentation

## 2020-09-26 DIAGNOSIS — N8311 Corpus luteum cyst of right ovary: Secondary | ICD-10-CM | POA: Diagnosis not present

## 2020-09-26 DIAGNOSIS — R109 Unspecified abdominal pain: Secondary | ICD-10-CM | POA: Diagnosis not present

## 2020-09-26 DIAGNOSIS — R112 Nausea with vomiting, unspecified: Secondary | ICD-10-CM | POA: Diagnosis not present

## 2020-09-26 DIAGNOSIS — Z9682 Presence of neurostimulator: Secondary | ICD-10-CM | POA: Insufficient documentation

## 2020-09-26 DIAGNOSIS — R1110 Vomiting, unspecified: Secondary | ICD-10-CM | POA: Diagnosis not present

## 2020-09-26 DIAGNOSIS — Z934 Other artificial openings of gastrointestinal tract status: Secondary | ICD-10-CM | POA: Diagnosis not present

## 2020-09-26 DIAGNOSIS — R634 Abnormal weight loss: Secondary | ICD-10-CM | POA: Insufficient documentation

## 2020-09-26 MED ORDER — IOHEXOL 300 MG/ML  SOLN
100.0000 mL | Freq: Once | INTRAMUSCULAR | Status: AC | PRN
  Administered 2020-09-26: 100 mL via INTRAVENOUS

## 2020-09-27 ENCOUNTER — Telehealth: Payer: Self-pay | Admitting: Internal Medicine

## 2020-09-27 ENCOUNTER — Ambulatory Visit (INDEPENDENT_AMBULATORY_CARE_PROVIDER_SITE_OTHER): Payer: Medicare Other | Admitting: Internal Medicine

## 2020-09-27 ENCOUNTER — Encounter: Payer: Self-pay | Admitting: Internal Medicine

## 2020-09-27 VITALS — BP 120/70 | HR 102 | Temp 98.6°F | Ht 63.0 in | Wt 97.2 lb

## 2020-09-27 DIAGNOSIS — R739 Hyperglycemia, unspecified: Secondary | ICD-10-CM | POA: Diagnosis not present

## 2020-09-27 DIAGNOSIS — R350 Frequency of micturition: Secondary | ICD-10-CM | POA: Insufficient documentation

## 2020-09-27 DIAGNOSIS — F411 Generalized anxiety disorder: Secondary | ICD-10-CM

## 2020-09-27 LAB — URINALYSIS, ROUTINE W REFLEX MICROSCOPIC
Bilirubin Urine: NEGATIVE
Hgb urine dipstick: NEGATIVE
Ketones, ur: NEGATIVE
Leukocytes,Ua: NEGATIVE
Nitrite: NEGATIVE
Specific Gravity, Urine: 1.01 (ref 1.000–1.030)
Total Protein, Urine: NEGATIVE
Urine Glucose: NEGATIVE
Urobilinogen, UA: 0.2 (ref 0.0–1.0)
pH: 7 (ref 5.0–8.0)

## 2020-09-27 MED ORDER — AMOXICILLIN 500 MG PO CAPS: 500.0000 mg | ORAL_CAPSULE | Freq: Three times a day (TID) | ORAL | 0 refills | Status: DC

## 2020-09-27 MED ORDER — NITROFURANTOIN MACROCRYSTAL 50 MG PO CAPS: 50.0000 mg | ORAL_CAPSULE | Freq: Two times a day (BID) | ORAL | 0 refills | Status: DC

## 2020-09-27 MED ORDER — FLUCONAZOLE 150 MG PO TABS: ORAL_TABLET | ORAL | 1 refills | Status: DC

## 2020-09-27 NOTE — Assessment & Plan Note (Signed)
Lab Results  Component Value Date   HGBA1C 5.1 02/18/2019   Stable, pt to continue current medical treatment  - diet

## 2020-09-27 NOTE — Telephone Encounter (Signed)
Pharmacy called regarding the order for Amoxicillin 500 mg  Patient is allergic to Penicillin and is requesting an alternative.    Preferred Pharmacy:   [id number] [place], [place] - [address] AT [facility] [address] [facility] [address] Phone:  [phone]  Fax:  [phone]

## 2020-09-27 NOTE — Progress Notes (Signed)
Patient ID: Karen Hahn, female   DOB: Apr 01, 1976, 45 y.o.   MRN: [id number]        Chief Complaint: urinary frequency       HPI:  Karen Hahn is a 45 y.o. female here with c/o 2 days onset urinary frequency, low back and abd pain, nausea but no f/c, vomiting flank pain or gross hematuria.  No recent UTi but several in past and has multiple drug allergies.  Pt states she is ok with pcn related antibx as her reaction in the past was a sort of rash to a finger on the hand ("I know its weird"). Pt denies chest pain, increased sob or doe, wheezing, orthopnea, PND, increased LE swelling, palpitations, dizziness or syncope.  Denies focal neuro s/s.   Pt denies polydipsia, polyuria, Had CT abd/pelvis yesterday, results pending still at this time  Wt Readings from Last 3 Encounters:  09/27/20 97 lb 3.2 oz (44.1 kg)  09/18/20 99 lb (44.9 kg)  09/12/20 98 lb (44.5 kg)   BP Readings from Last 3 Encounters:  09/27/20 120/70  09/18/20 (!) 84/62  09/12/20 116/62         Past Medical History:  Diagnosis Date  . ADD (attention deficit disorder)   . Allergy   . Anorexia nervosa without bulimia   . Chronic constipation   . Drug abuse (HCC)   . GAD (generalized anxiety disorder)   . GERD (gastroesophageal reflux disease)   . History of gastric restrictive surgery   . History of gastric ulcer   . History of Helicobacter pylori infection    after 2003  . HLD (hyperlipidemia) 02/16/2018  . Impulse control disorder   . Moderate major depression (HCC)   . Osteopenia   . Osteoporosis   . Post concussion syndrome    fell 10/20/2015  . SMAS (superior mesenteric artery syndrome) (HCC)   . Urge urinary incontinence    Past Surgical History:  Procedure Laterality Date  . DILATION AND CURETTAGE OF UTERUS  2005  . INTERSTIM IMPLANT PLACEMENT N/A 10/18/2015   Procedure: Karen Hahn IMPLANT FIRST STAGE;  Surgeon: Jerilee Field, MD;  Location: [facility];  Service: Urology;   Laterality: N/A;  . INTERSTIM IMPLANT PLACEMENT N/A 10/18/2015   Procedure: Karen Hahn IMPLANT SECOND STAGE;  Surgeon: Jerilee Field, MD;  Location: [facility];  Service: Urology;  Laterality: N/A;  . LAPAROSCOPIC GASTRIC RESTRICTIVE DUODENAL PROCEDURE (DUODENAL SWITCH)  12/ 2003  . ORIF RIGHT WRIST FX  2013   REMOVAL HARDWARE X2 in  2013--  No retained hardware    reports that she quit smoking about 3 years ago. Her smoking use included cigarettes. She has a 0.50 pack-year smoking history. She has never used smokeless tobacco. She reports that she does not drink alcohol and does not use drugs. family history includes ADD / ADHD in her brother; Alcohol abuse in her maternal uncle and mother; Anxiety disorder in her father, maternal grandmother, and mother; Breast cancer (age of onset: 6) in her paternal grandmother; COPD in her father and mother; Cancer in her mother; Depression in her maternal grandmother and mother; Drug abuse in her father; Stroke in her mother. Allergies  Allergen Reactions  . Bactrim [Sulfamethoxazole-Trimethoprim] Other (See Comments)    Yeast infection  . Penicillins Other (See Comments)    Has patient had a PCN reaction causing immediate rash, facial/tongue/throat swelling, SOB or lightheadedness with hypotension: NO (UTI, YEAST INFECTION) Has patient had a PCN reaction causing severe  rash involving mucus membranes or skin necrosis: NO Has patient had a PCN reaction that required hospitalization NO Has patient had a PCN reaction occurring within the last 10 years: NO If all of the above answers are "NO", then may proceed with Cephalosporin use.   . Ciprofloxacin Itching  . Morphine And Related Itching and Other (See Comments)    "whole body feels tight feeling"  . Reglan [Metoclopramide]   . Benadryl [Diphenhydramine Hcl] Palpitations  . Diflucan [Fluconazole] Rash   Current Outpatient Medications on File Prior to Visit  Medication Sig Dispense  Refill  . Calcium-Iron-Vit D-Vit K 500-08-998-40 MG-UNT-MCG CHEW Chew by mouth daily.    Marland Kitchen dicyclomine (BENTYL) 10 MG capsule TAKE 1 CAPSULE BY MOUTH FOUR TIMES DAILY BEFORE MEALS AND AT BEDTIME (Patient taking differently: 20 mg. Take 1 capsule by mouth four times daily before meals and at bedtime.) 120 capsule 5  . Erenumab-aooe (AIMOVIG) 140 MG/ML SOAJ Inject 140 mg into the skin every 30 (thirty) days. 1 pen 11  . ferrous gluconate (FERGON) 324 MG tablet Take 1 tablet (324 mg total) by mouth daily with breakfast. 90 tablet 3  . ibuprofen (ADVIL) 600 MG tablet Take by mouth every 6 (six) hours as needed.    . lamoTRIgine (LAMICTAL) 150 MG tablet TAKE 2 TABLETS(300 MG) BY MOUTH DAILY 180 tablet 0  . Multiple Vitamins-Minerals (MULTIVITAMIN GUMMIES ADULT) CHEW Chew by mouth.    . naproxen (NAPROSYN) 500 MG tablet Take 1 tablet (500 mg total) by mouth 2 (two) times daily as needed. 20 tablet 3  . ondansetron (ZOFRAN) 4 MG tablet TAKE 1 TABLET(4 MG) BY MOUTH EVERY 6 HOURS AS NEEDED FOR NAUSEA OR VOMITING 30 tablet 2  . pantoprazole (PROTONIX) 40 MG tablet TAKE 1 TABLET BY MOUTH EVERY MORNING 90 tablet 0  . PARoxetine (PAXIL) 10 MG tablet Take 1 tablet (10 mg total) by mouth daily. 90 tablet 3  . promethazine (PHENERGAN) 25 MG tablet TAKE 1 TABLET(25 MG) BY MOUTH TWICE DAILY AS NEEDED 60 tablet 2  . rizatriptan (MAXALT-MLT) 10 MG disintegrating tablet TAKE 1 TABLET BY MOUTH AS NEEDED FOR MIGRANE. MAY REPEAT IN 2 HOURS IF NEEDED 10 tablet 2  . rosuvastatin (CRESTOR) 20 MG tablet TAKE 1 TABLET(20 MG) BY MOUTH DAILY 90 tablet 3  . UBRELVY 100 MG TABS TAKE 1 TABLET BY MOUTH AS NEEDED(MAY REPEAT DOSE AFTER 2 HOURS) MAXIMUM 2 TABLETS IN 24 HOURS 16 tablet 0  . mirtazapine (REMERON) 15 MG tablet Take 15 mg by mouth at bedtime. (Patient not taking: Reported on 09/27/2020)    . traMADol (ULTRAM) 50 MG tablet Take 1 tablet (50 mg total) by mouth every 6 (six) hours as needed for severe pain. 15 tablet 0   No  current facility-administered medications on file prior to visit.        ROS:  All others reviewed and negative.  Objective        PE:  BP 120/70   Pulse (!) 102   Temp 98.6 F (37 C) (Oral)   Ht 5\' 3"  (1.6 m)   Wt 97 lb 3.2 oz (44.1 kg)   LMP 09/07/2020 (Approximate)   SpO2 97%   BMI 17.22 kg/m                 Constitutional: Pt appears in NAD               HENT: Head: NCAT.  Right Ear: External ear normal.                 Left Ear: External ear normal.                Eyes: . Pupils are equal, round, and reactive to light. Conjunctivae and EOM are normal               Nose: without d/c or deformity               Neck: Neck supple. Gross normal ROM               Cardiovascular: Normal rate and regular rhythm.                 Pulmonary/Chest: Effort normal and breath sounds without rales or wheezing.                Abd:  Soft, NT, ND, + BS, no organomegaly, no flank tender               Neurological: Pt is alert. At baseline orientation, motor grossly intact               Skin: Skin is warm. No rashes, no other new lesions, LE edema - none               Psychiatric: Pt behavior is normal without agitation   Micro: none  Cardiac tracings I have personally interpreted today:  none  Pertinent Radiological findings (summarize): none   Lab Results  Component Value Date   WBC 7.4 09/18/2020   HGB 12.0 09/18/2020   HCT 35.3 (L) 09/18/2020   PLT 259.0 09/18/2020   GLUCOSE 96 09/18/2020   CHOL 180 02/21/2020   TRIG 93.0 02/21/2020   HDL 56.20 02/21/2020   LDLCALC 105 (H) 02/21/2020   ALT 29 09/18/2020   AST 25 09/18/2020   NA 137 09/18/2020   K 4.6 09/18/2020   CL 103 09/18/2020   CREATININE 0.63 09/18/2020   BUN 13 09/18/2020   CO2 29 09/18/2020   TSH 1.76 01/07/2020   HGBA1C 5.1 02/18/2019   Assessment/Plan:  Karen Hahn is a 45 y.o. White or Caucasian [1] female with  has a past medical history of ADD (attention deficit disorder), Allergy,  Anorexia nervosa without bulimia, Chronic constipation, Drug abuse (HCC), GAD (generalized anxiety disorder), GERD (gastroesophageal reflux disease), History of gastric restrictive surgery, History of gastric ulcer, History of Helicobacter pylori infection, HLD (hyperlipidemia) (02/16/2018), Impulse control disorder, Moderate major depression (HCC), Osteopenia, Osteoporosis, Post concussion syndrome, SMAS (superior mesenteric artery syndrome) (HCC), and Urge urinary incontinence.  Urinary frequency Hx is high suggestive, Exam benign, for urine studies, empiric rx ,  to f/u any worsening symptoms or concerns  Hyperglycemia Lab Results  Component Value Date   HGBA1C 5.1 02/18/2019   Stable, pt to continue current medical treatment  - diet   GAD (generalized anxiety disorder) Stable today, cont current med tx and behavioral health follow up  Followup: Return if symptoms worsen or fail to improve.  Oliver Barre, MD 09/27/2020 8:50 PM [facility]  Primary Care - [facility]

## 2020-09-27 NOTE — Assessment & Plan Note (Signed)
Stable today, cont current med tx and behavioral health follow up

## 2020-09-27 NOTE — Assessment & Plan Note (Signed)
Hx is high suggestive, Exam benign, for urine studies, empiric rx ,  to f/u any worsening symptoms or concerns

## 2020-09-27 NOTE — Patient Instructions (Signed)
Your allergies have been reviewed with you  Please take all new medication as prescribed- the amoxil, and diflucan as needed  Please continue all other medications as before, and refills have been done if requested.  Please have the pharmacy call with any other refills you may need.  Please keep your appointments with your specialists as you may have planned  Your CT scan results are pending from yesterday  Please go to the LAB at the blood drawing area for the tests to be done - just the urine testing today  You will be contacted by phone if any changes need to be made immediately.  Otherwise, you will receive a letter about your results with an explanation, but please check with MyChart first.  Please remember to sign up for MyChart if you have not done so, as this will be important to you in the future with finding out test results, communicating by private email, and scheduling acute appointments online when needed.

## 2020-09-28 ENCOUNTER — Encounter: Payer: Self-pay | Admitting: Internal Medicine

## 2020-09-28 LAB — URINE CULTURE: Result:: NO GROWTH

## 2020-09-28 NOTE — Telephone Encounter (Signed)
No antibiotic needed since the urine test is negative; ok to hold for now pending the culture

## 2020-10-01 ENCOUNTER — Other Ambulatory Visit: Payer: Self-pay | Admitting: Internal Medicine

## 2020-10-02 ENCOUNTER — Encounter: Payer: Medicare Other | Admitting: Obstetrics and Gynecology

## 2020-10-02 ENCOUNTER — Other Ambulatory Visit: Payer: Self-pay

## 2020-10-02 MED ORDER — ONDANSETRON HCL 4 MG PO TABS: 4.0000 mg | ORAL_TABLET | Freq: Three times a day (TID) | ORAL | 2 refills | Status: DC | PRN

## 2020-10-02 MED ORDER — OMEPRAZOLE 40 MG PO CPDR: 40.0000 mg | DELAYED_RELEASE_CAPSULE | Freq: Every day | ORAL | 3 refills | Status: DC

## 2020-10-03 ENCOUNTER — Ambulatory Visit (INDEPENDENT_AMBULATORY_CARE_PROVIDER_SITE_OTHER): Payer: Medicare Other | Admitting: Obstetrics and Gynecology

## 2020-10-03 ENCOUNTER — Other Ambulatory Visit: Payer: Self-pay

## 2020-10-03 ENCOUNTER — Encounter: Payer: Self-pay | Admitting: Obstetrics and Gynecology

## 2020-10-03 VITALS — BP 110/58 | HR 80 | Ht 62.75 in | Wt 98.8 lb

## 2020-10-03 DIAGNOSIS — Z01419 Encounter for gynecological examination (general) (routine) without abnormal findings: Secondary | ICD-10-CM

## 2020-10-03 DIAGNOSIS — M8580 Other specified disorders of bone density and structure, unspecified site: Secondary | ICD-10-CM

## 2020-10-03 DIAGNOSIS — N943 Premenstrual tension syndrome: Secondary | ICD-10-CM | POA: Diagnosis not present

## 2020-10-03 MED ORDER — NORETHINDRONE ACET-ETHINYL EST 1-20 MG-MCG PO TABS: 1.0000 | ORAL_TABLET | Freq: Every day | ORAL | 4 refills | Status: DC

## 2020-10-03 NOTE — Progress Notes (Signed)
Karen Hahn 05/21/1976 [id number]  SUBJECTIVE:  45 y.o. G59P0020 female here for annual routine gynecologic exam.  Has concerns about reactivation of PMS type symptoms with mood swings prior to the onset of her menses which resolve after her menstrual period.  Had been on birth control pills about 20 years ago which helped. She has no other gynecologic concerns.    Current Outpatient Medications  Medication Sig Dispense Refill  . Calcium-Iron-Vit D-Vit K [id number] MG-UNT-MCG CHEW Chew by mouth daily.    Marland Kitchen dicyclomine (BENTYL) 10 MG capsule TAKE 1 CAPSULE BY MOUTH FOUR TIMES DAILY BEFORE MEALS AND AT BEDTIME (Patient taking differently: 20 mg. Take 1 capsule by mouth four times daily before meals and at bedtime.) 120 capsule 5  . Erenumab-aooe (AIMOVIG) 140 MG/ML SOAJ Inject 140 mg into the skin every 30 (thirty) days. 1 pen 11  . ferrous gluconate (FERGON) 324 MG tablet Take 1 tablet (324 mg total) by mouth daily with breakfast. 90 tablet 3  . fluconazole (DIFLUCAN) 150 MG tablet 1 tab by mouth every 3 days as needed 2 tablet 1  . ibuprofen (ADVIL) 600 MG tablet TAKE 1 TABLET(600 MG) BY MOUTH EVERY 6 HOURS AS NEEDED 60 tablet 0  . lamoTRIgine (LAMICTAL) 150 MG tablet TAKE 2 TABLETS(300 MG) BY MOUTH DAILY 180 tablet 0  . mirtazapine (REMERON) 15 MG tablet Take 15 mg by mouth at bedtime.    . Multiple Vitamins-Minerals (MULTIVITAMIN GUMMIES ADULT) CHEW Chew by mouth.    . naproxen (NAPROSYN) 500 MG tablet Take 1 tablet (500 mg total) by mouth 2 (two) times daily as needed. 20 tablet 3  . nitrofurantoin (MACRODANTIN) 50 MG capsule Take 1 capsule (50 mg total) by mouth 2 (two) times daily. 20 capsule 0  . omeprazole (PRILOSEC) 40 MG capsule Take 1 capsule (40 mg total) by mouth daily. 90 capsule 3  . ondansetron (ZOFRAN) 4 MG tablet Take 1 tablet (4 mg total) by mouth every 8 (eight) hours as needed for nausea or vomiting. 30 tablet 2  . PARoxetine (PAXIL) 10 MG tablet Take 1 tablet (10  mg total) by mouth daily. 90 tablet 3  . promethazine (PHENERGAN) 25 MG tablet TAKE 1 TABLET(25 MG) BY MOUTH TWICE DAILY AS NEEDED 60 tablet 2  . rizatriptan (MAXALT-MLT) 10 MG disintegrating tablet TAKE 1 TABLET BY MOUTH AS NEEDED FOR MIGRANE. MAY REPEAT IN 2 HOURS IF NEEDED 10 tablet 2  . rosuvastatin (CRESTOR) 20 MG tablet TAKE 1 TABLET(20 MG) BY MOUTH DAILY 90 tablet 3  . UBRELVY 100 MG TABS TAKE 1 TABLET BY MOUTH AS NEEDED(MAY REPEAT DOSE AFTER 2 HOURS) MAXIMUM 2 TABLETS IN 24 HOURS 16 tablet 0   No current facility-administered medications for this visit.   Allergies: Bactrim [sulfamethoxazole-trimethoprim], Penicillins, Ciprofloxacin, Morphine and related, Reglan [metoclopramide], Benadryl [diphenhydramine hcl], and Diflucan [fluconazole]  Patient's last menstrual period was 09/07/2020 (approximate).  Past medical history,surgical history, problem list, medications, allergies, family history and social history were all reviewed and documented as reviewed in the EPIC chart.  ROS: Pertinent positives and negatives as reviewed in HPI  OBJECTIVE:  BP (!) 110/58 (BP Location: Right Arm, Patient Position: Sitting, Cuff Size: Normal)   Pulse 80   Ht 5' 2.75" (1.594 m)   Wt 98 lb 12.8 oz (44.8 kg)   LMP 09/07/2020 (Approximate)   BMI 17.64 kg/m  The patient appears well, alert, oriented x 3, in no distress. ENT normal.  Neck supple. No cervical or supraclavicular adenopathy or  thyromegaly.  Lungs are clear, good air entry, no wheezes, rhonchi or rales. S1 and S2 normal, no murmurs, regular rate and rhythm.  Abdomen soft without tenderness, guarding, mass or organomegaly.  Neurological is normal, no focal findings.  BREAST EXAM: breasts appear normal, no suspicious masses, no skin or nipple changes or axillary nodes  PELVIC EXAM: VULVA: normal appearing vulva with no masses, tenderness or lesions, VAGINA: normal appearing vagina with normal color and discharge, no lesions, CERVIX: normal  appearing cervix without discharge or lesions, UTERUS: uterus is normal size, shape, consistency and nontender, ADNEXA: normal adnexa in size, nontender and no masses   Chaperone: Zenovia Jordan present during the examination  ASSESSMENT:  45 y.o. G2P0020 here for annual gynecologic exam  PLAN:   1.  Normal menses.  History of PMS with recurrence of symptoms.  Discussed oral contraceptive pills are first-line treatment.  We reviewed her health history and she denies any history of thrombotic diseases, liver problems, and is a non-smoker.  Recent normal routine screening labs.  She says her friend smokes but she is not around this person frequently.  She does have migraine headaches which are treated by neurology.  Specifically denies any aura symptoms such as light sensitivity or paresthesias with the headaches.  We discussed birth control pills can increase headache symptoms and recurrence, or risk of stroke in those that have aura symptoms with the migraines.  Thrombotic and other age-related risks reviewed with use of birth control pills.  We will start her on Loestrin 1/20 equivalent see if this helps her PMS symptoms.   2. Pap smear/HPV 09/2017.  Repeat at the 5-year interval in 2024. 3. Contraception. Not using, not sexually active.  Prescribing Loestrin equivalent as above for PMS. 4. DEXA 02/2020 T score -2.1 in spine, FRAX 2% / 0.2%. She is high risk for bone loss.  Follows with Rheumatology elsewhere.  She says she was prescribed vitamin D and calcium which she is yet to start taking.  Follow-up DEXA in 2023. 5.  History of stable, probably benign fibroadenoma in right breast.  This was evaluated in 2018, 2019, and 2020 by ultrasound and mammogram.  No findings of evidence of malignancy.  Normal breast exam today.  Encouraged breast self awareness and notify us of any concerning changes.  Repeat mammogram this year when due. 6. Health maintenance.  Routine screening blood work is performed  elsewhere.  The patient is aware that I will only be at this practice until the end of this week so she will follow-up with one of my partners for ongoing care.  Return annually or sooner, prn.  Theresia Majors MD  10/03/20

## 2020-10-05 ENCOUNTER — Telehealth (INDEPENDENT_AMBULATORY_CARE_PROVIDER_SITE_OTHER): Payer: Medicare Other | Admitting: Psychiatry

## 2020-10-05 ENCOUNTER — Other Ambulatory Visit: Payer: Self-pay

## 2020-10-05 DIAGNOSIS — F411 Generalized anxiety disorder: Secondary | ICD-10-CM

## 2020-10-05 DIAGNOSIS — F5000 Anorexia nervosa, unspecified: Secondary | ICD-10-CM

## 2020-10-05 DIAGNOSIS — F3181 Bipolar II disorder: Secondary | ICD-10-CM

## 2020-10-05 DIAGNOSIS — F639 Impulse disorder, unspecified: Secondary | ICD-10-CM

## 2020-10-05 NOTE — Progress Notes (Signed)
Virtual Visit via Telephone Note  I connected with Karen Hahn on 10/05/20 at  4:00 PM EST by telephone and verified that I am speaking with the correct person using two identifiers.  Location: Patient: in car Provider: office   I discussed the limitations, risks, security and privacy concerns of performing an evaluation and management service by telephone and the availability of in person appointments. I also discussed with the patient that there may be a patient responsible charge related to this service. The patient expressed understanding and agreed to proceed.   History of Present Illness: Karen Hahn is having random severe GI pain. She is working with her PCP and a nutritionist. Her appetite is good and she is eating 5 times a day. She has tolerated the Paxil and is denying SE. She is no longer taking the Remeron. Her mood is stable. Her depression and anxiety are tolerable. Her irritability is ongoing and is working with her gynecologist. She is about to start OCT. Karen Hahn denies any angry outbursts. She denies any manic or hypomanic like episodes since our last visit 1 month ago.  She denies SI/HI.    Observations/Objective:  General Appearance: unable to assess  Eye Contact:  unable to assess  Speech:  Clear and Coherent and Normal Rate  Volume:  Normal  Mood:  Euthymic  Affect:  Full Range  Thought Process:  Goal Directed, Linear and Descriptions of Associations: Intact  Orientation:  Full (Time, Place, and Person)  Thought Content:  Logical  Suicidal Thoughts:  No  Homicidal Thoughts:  No  Memory:  Immediate;   Good  Judgement:  Good  Insight:  Good  Psychomotor Activity: unable to assess  Concentration:  Concentration: Good  Recall:  Good  Fund of Knowledge:  Good  Language:  Good  Akathisia:  unable to assess  Handed:  Right  AIMS (if indicated):     Assets:  Communication Skills Desire for Improvement Financial Resources/Insurance Housing Leisure  Time Resilience Social Support Talents/Skills Transportation Vocational/Educational  ADL's:  unable to assess  Cognition:  WNL  Sleep:         Assessment and Plan: 1. Bipolar II disorder (HCC)  2. GAD (generalized anxiety disorder)  3. Anorexia nervosa  4. Impulse control disorder  continue Paxil Continue Lamictal  D/c Remeron  Follow Up Instructions: In 2- 3 months or sooner if needed   I discussed the assessment and treatment plan with the patient. The patient was provided an opportunity to ask questions and all were answered. The patient agreed with the plan and demonstrated an understanding of the instructions.   The patient was advised to call back or seek an in-person evaluation if the symptoms worsen or if the condition fails to improve as anticipated.  I provided 8 minutes of non-face-to-face time during this encounter.   Oletta Darter, MD

## 2020-10-06 DIAGNOSIS — G5601 Carpal tunnel syndrome, right upper limb: Secondary | ICD-10-CM | POA: Diagnosis not present

## 2020-10-10 ENCOUNTER — Other Ambulatory Visit: Payer: Self-pay | Admitting: Internal Medicine

## 2020-10-12 ENCOUNTER — Encounter: Payer: Medicare Other | Attending: Internal Medicine | Admitting: Registered"

## 2020-10-12 ENCOUNTER — Other Ambulatory Visit: Payer: Self-pay

## 2020-10-12 ENCOUNTER — Encounter: Payer: Self-pay | Admitting: Registered"

## 2020-10-12 DIAGNOSIS — F5000 Anorexia nervosa, unspecified: Secondary | ICD-10-CM | POA: Insufficient documentation

## 2020-10-12 DIAGNOSIS — R634 Abnormal weight loss: Secondary | ICD-10-CM | POA: Diagnosis not present

## 2020-10-12 DIAGNOSIS — Z713 Dietary counseling and surveillance: Secondary | ICD-10-CM | POA: Insufficient documentation

## 2020-10-12 NOTE — Patient Instructions (Addendum)
-   Aim to have at least 3 bottles of water a day. Can add flavor packs for variety.   - Continue to follow plate method for meals.

## 2020-10-12 NOTE — Progress Notes (Signed)
Appointment start time: 4:35  Appointment end time: 5:12  Patient was seen on 10/12/2020 for nutrition counseling pertaining to disordered eating  Primary care provider: Oliver Barre, MD Therapist: none; has some numbers to call   ROI: N/A Any other medical team members: Chriss Driver, MD (psychiatrist)   Assessment  Pt states she craves salt and also craves Coca Cola at night. States she has been increasing intake by adding in another snack. States she knows she needs to increase water intake.   States recent CT scan found the gallbladder is extended. States she has an upcoming GI appt with 3/14 as follow-up. States she is still having a lot of constipation. States she has bowel movements daily but experiences bleeding and stomach discomfort as well.   Previous appt: Last visit with me was 08/2018 and pt was weighing ~104.5 lbs. States she's losing a lot of weight now. Weighed 99 lbs with clothes on at appt yesterday, 09/19/20. States she is having a lot of nausea. Taking medications daily, promethazine and Zofran to help with nausea. Reports sometimes she will add in chewable Pepto Bismol as needed.   Reports she likes to snack at night. States she loves Takis although they are hot. Does not drink water, mostly drinks soda and coffee.   Growth Metrics: Goal rate of weight gain: 0.5-1.0 lb/week  Eating history: Length of time: age 18 - early 53's. Previous treatments:  Goals for RD meetings:   Weight history:  Recent weight: 101 lbs Weight changes: -3.5 lbs from 2 years ago 104.5 lbs (08/2018) Highest weight: 115 (6 years ago)    Lowest weight: 80  Most consistent weight: 105   How has weight changed in the past year: recently has dropped  Medical Information:  Changes in hair, skin, nails since ED started: no Chewing/swallowing difficulties: no Reflux or heartburn: yes, taking protonix Trouble with teeth: no LMP without the use of hormones: 3/3  Weight at that point:  unsure Effect of exercise on menses: N/A   Effect of hormones on menses: monthly cycle Constipation, diarrhea: yes, constipation has BM once a day Dizziness/lightheadedness: no Headaches/body aches: yes, headaches; takes migraine medications Heart racing/chest pain: heart racing, increased HR due to anxiety, normal for her Mood: some depression present  Sleep: sleeps 8-8.5 hrs/night Focus/concentration: no Cold intolerance: no Vision changes: yes, has eye exam 3/28  Mental health diagnosis: previous history of AN   Dietary assessment: A typical day consists of 3 meals and 1-2 snacks  Safe foods include: all; not a picky eater Avoided foods include: none  24 hour recall:  B (10:30 am): blueberry english muffin + butter + 2 eggs + cottage cheese + orange  S:  L (3:30 pm): ham & cheese sandwich with mustard + handful chips + carrots + 1/2 can soda S: yogurt + granola D (8:30 pm): bowl chicken alfredo + bread and butter + 1/2 can soda S: can of Coke S (11 pm): Takis  Beverages: coffee, soda (2*12 oz; 24 oz)  What Methods Do You Use To Control Your Weight (Compensatory behaviors)?  none  Estimated energy intake: 2800-2900 kcal  Estimated energy needs: 2000-2400 kcal 250-300 g CHO 100-120 g pro 67-80 g fat  Nutrition Diagnosis: NB-1.5 Disordered eating pattern As related to history of anorexia nervosa.  As evidenced by pt reported.  Intervention/Goals: Pt was encouraged with changes made from previous appt. Discussed role of increasing water intake and ways to do this. Pt was in agreement with goals listed.  Goals: - Aim to have at least 3 bottles of water a day. Can add flavor packs for variety.  - Continue to follow plate method for meals.   Meal plan:    3 meals    2 snacks   Monitoring and Evaluation: Patient will follow up in 2 weeks.

## 2020-10-16 ENCOUNTER — Ambulatory Visit (INDEPENDENT_AMBULATORY_CARE_PROVIDER_SITE_OTHER): Payer: Medicare Other | Admitting: Physician Assistant

## 2020-10-16 ENCOUNTER — Encounter: Payer: Self-pay | Admitting: Physician Assistant

## 2020-10-16 VITALS — BP 100/60 | HR 92 | Ht 62.75 in | Wt 100.0 lb

## 2020-10-16 DIAGNOSIS — K5909 Other constipation: Secondary | ICD-10-CM | POA: Diagnosis not present

## 2020-10-16 DIAGNOSIS — R1013 Epigastric pain: Secondary | ICD-10-CM | POA: Diagnosis not present

## 2020-10-16 DIAGNOSIS — K589 Irritable bowel syndrome without diarrhea: Secondary | ICD-10-CM

## 2020-10-16 NOTE — Patient Instructions (Addendum)
If you are age 43 or older, your body mass index should be between 23-30. Your Body mass index is 17.86 kg/m. If this is out of the aforementioned range listed, please consider follow up with your Primary Care Provider.  If you are age 39 or younger, your body mass index should be between 19-25. Your Body mass index is 17.86 kg/m. If this is out of the aformentioned range listed, please consider follow up with your Primary Care Provider.   You will be contacted by [facility] Scheduling in the next 2 days to arrange a Abdominal US.  The number on your caller ID will be [phone], please answer when they call.  If you have not heard from them in 2 days please call [phone] to schedule.    Amy Esterwood, PA-C recommends that you complete a bowel purge (to clean out your bowels). Please do the following: Purchase a bottle of Miralax over the counter as well as a box of 5 mg dulcolax tablets. Take 4 dulcolax tablets. Wait 1 hour. You will then drink 6-8 capfuls of Miralax mixed in an adequate amount of water/juice/gatorade (you may choose which of these liquids to drink) over the next 2-3 hours. You should expect results within 1 to 6 hours after completing the bowel purge.  After you complete your bowel purge continue your Miralax at twice daily.  STOP your dicyclomine   We will contact you about a Ct in August.  Follow up as needed for now.  Thank you for entrusting me with your care and choosing [facility].  Amy Esterwood, PA-C

## 2020-10-16 NOTE — Progress Notes (Signed)
Subjective:    Patient ID: Karen Hahn, female    DOB: Apr 22, 1976, 45 y.o.   MRN: [id number]  HPI Karen Hahn is a pleasant 45 year old female, established with Dr. Marina Goodell.  She was last seen in the office on 09/18/2020 by myself and comes in today for follow-up.  She has history of IBS, anorexia nervosa, history of depression anxiety, ADD and GERD.  She is status post gastrojejunostomy done in 2003 for SMA syndrome and had come in with complaints of persistent/chronic nausea.  She had recent EGD in December 2021 per Dr. Marina Goodell which was unremarkable.  She was also complaining of intermittent episodes of severe crampy abdominal pain. She underwent CT of the abdomen pelvis on 09/26/2020 to rule out any partial obstruction.  She was found to have post gastrojejunostomy anatomy, no dilated bowel or evidence for partial obstruction, stool-filled colon, noted to have a distended gallbladder and pancreatic duct of 5 mm without sign of pancreatic inflammation.  Main pancreatic duct not significantly changed from September 2016 imaging and of uncertain significance clinically.  42-month follow-up MRCP was suggested to rule out IPMN, patient has a sacral nerve stimulator in the left gluteal region.  Today she says her nausea is a little bit better, she does use Phenergan 25 mg most mornings and then Zofran as needed.  She continues on omeprazole 40 mg p.o. daily.  She has been having more trouble with constipation which has been a chronic issue as well.  She says she always has hard large stools.  She had been on Linzess at one point in the remote past but says that that had caused diarrhea.  She had called the office and was asked to start on MiraLAX last week.  She has been taking 1 dose daily and did have a bowel movement x1 this weekend. She continues to have low level complaints of crampy abdominal discomfort and then has a intermittent sharper epigastric pain that will radiate through to her lower abdomen.  She  has been on Bentyl 20 mg 4 times daily over the past several months and says she is not sure how much good this is doing her.  Review of Systems Pertinent positive and negative review of systems were noted in the above HPI section.  All other review of systems was otherwise negative.  Outpatient Encounter Medications as of 10/16/2020  Medication Sig  . Calcium-Iron-Vit D-Vit K [id number] MG-UNT-MCG CHEW Chew by mouth daily.  Dorise Hiss (AIMOVIG) 140 MG/ML SOAJ Inject 140 mg into the skin every 30 (thirty) days.  . ferrous gluconate (FERGON) 324 MG tablet Take 1 tablet (324 mg total) by mouth daily with breakfast.  . fluconazole (DIFLUCAN) 150 MG tablet 1 tab by mouth every 3 days as needed  . ibuprofen (ADVIL) 600 MG tablet TAKE 1 TABLET(600 MG) BY MOUTH EVERY 6 HOURS AS NEEDED  . lamoTRIgine (LAMICTAL) 150 MG tablet TAKE 2 TABLETS(300 MG) BY MOUTH DAILY  . Multiple Vitamins-Minerals (MULTIVITAMIN GUMMIES ADULT) CHEW Chew by mouth.  . naproxen (NAPROSYN) 500 MG tablet Take 1 tablet (500 mg total) by mouth 2 (two) times daily as needed.  . nitrofurantoin (MACRODANTIN) 50 MG capsule Take 1 capsule (50 mg total) by mouth 2 (two) times daily.  . norethindrone-ethinyl estradiol (LOESTRIN) 1-20 MG-MCG tablet Take 1 tablet by mouth daily. Start 1st Sunday after menstrual period begins  . omeprazole (PRILOSEC) 40 MG capsule Take 1 capsule (40 mg total) by mouth daily.  . ondansetron (ZOFRAN) 4 MG tablet Take  1 tablet (4 mg total) by mouth every 8 (eight) hours as needed for nausea or vomiting.  Marland Kitchen PARoxetine (PAXIL) 10 MG tablet Take 1 tablet (10 mg total) by mouth daily.  . promethazine (PHENERGAN) 25 MG tablet TAKE 1 TABLET(25 MG) BY MOUTH TWICE DAILY AS NEEDED  . rizatriptan (MAXALT-MLT) 10 MG disintegrating tablet TAKE 1 TABLET BY MOUTH AS NEEDED FOR MIGRANE. MAY REPEAT IN 2 HOURS IF NEEDED  . rosuvastatin (CRESTOR) 20 MG tablet TAKE 1 TABLET(20 MG) BY MOUTH DAILY  . UBRELVY 100 MG TABS TAKE  1 TABLET BY MOUTH AS NEEDED(MAY REPEAT DOSE AFTER 2 HOURS) MAXIMUM 2 TABLETS IN 24 HOURS  . [DISCONTINUED] dicyclomine (BENTYL) 10 MG capsule TAKE 1 CAPSULE BY MOUTH FOUR TIMES DAILY BEFORE MEALS AND AT BEDTIME (Patient taking differently: 20 mg. Take 1 capsule by mouth four times daily before meals and at bedtime.)   No facility-administered encounter medications on file as of 10/16/2020.   Allergies  Allergen Reactions  . Bactrim [Sulfamethoxazole-Trimethoprim] Other (See Comments)    Yeast infection  . Penicillins Other (See Comments)    Has patient had a PCN reaction causing immediate rash, facial/tongue/throat swelling, SOB or lightheadedness with hypotension: NO (UTI, YEAST INFECTION) Has patient had a PCN reaction causing severe rash involving mucus membranes or skin necrosis: NO Has patient had a PCN reaction that required hospitalization NO Has patient had a PCN reaction occurring within the last 10 years: NO If all of the above answers are "NO", then may proceed with Cephalosporin use.   . Ciprofloxacin Itching  . Morphine And Related Itching and Other (See Comments)    "whole body feels tight feeling"  . Reglan [Metoclopramide]   . Benadryl [Diphenhydramine Hcl] Palpitations  . Diflucan [Fluconazole] Rash   Patient Active Problem List   Diagnosis Date Noted  . Urinary frequency 09/27/2020  . COVID 08/31/2020  . Weight loss 02/21/2020  . Right wrist pain 07/29/2019  . Right hand pain 07/29/2019  . Intractable headache 04/14/2019  . Hyperglycemia 02/18/2019  . HLD (hyperlipidemia) 02/16/2018  . Encounter for well adult exam with abnormal findings 05/21/2017  . UTI (urinary tract infection) 05/21/2017  . Thrush 05/21/2017  . Vitamin D deficiency 05/21/2017  . Osteopenia 05/21/2017  . Dyspnea 05/21/2017  . Hematuria 01/01/2017  . Lump in female breast 01/01/2017  . Fatigue 01/01/2017  . Candida vaginitis 01/01/2017  . Dysuria 12/19/2016  . Gastroesophageal reflux  disease with esophagitis 11/14/2016  . Irritable bowel syndrome with constipation 11/14/2016  . Severe episode of recurrent major depressive disorder, without psychotic features (HCC) 08/24/2015  . Cough 11/15/2014  . Major depressive disorder, recurrent episode, moderate (HCC) 07/14/2014  . ADD (attention deficit hyperactivity disorder, inattentive type) 07/14/2014  . Impulse control disorder 07/14/2014  . Depression 03/08/2014  . GAD (generalized anxiety disorder) 03/08/2014  . Anorexia nervosa 11/17/2013  . Impulse control disorder, unspecified 11/17/2013  . Cannabis abuse 11/17/2013  . Insomnia 11/17/2013   Social History   Socioeconomic History  . Marital status: Divorced    Spouse name: Not on file  . Number of children: 0  . Years of education: 48  . Highest education level: Not on file  Occupational History  . Occupation: Disability  Tobacco Use  . Smoking status: Former Smoker    Packs/day: 0.50    Years: 1.00    Pack years: 0.50    Types: Cigarettes    Quit date: 12/03/2016    Years since quitting: 3.8  . Smokeless  tobacco: Never Used  . Tobacco comment: quit smoking 8 months ago as of 10/15/18  Vaping Use  . Vaping Use: Never used  Substance and Sexual Activity  . Alcohol use: No    Alcohol/week: 0.0 standard drinks  . Drug use: No    Frequency: 7.0 times per week    Types: Marijuana    Comment: hx of and last used cocaine 2012. she was smoking marjiuana 7g/week. smoking thru out the day but quit 4 months ago  . Sexual activity: Not Currently    Comment: 1st intercourse 45 yo-Fewer than 5 partners  Other Topics Concern  . Not on file  Social History Narrative   Fun: Play piano; Build Lego   Denies abuse and feels safe at home. Living with friend in [place].    Right handed    Drink coffee 1 cup eveyday   Leave with 2 friends   1 story home, 16 steps   Social Determinants of Health   Financial Resource Strain: Not on file  Food Insecurity: No Food  Insecurity  . Worried About Programme researcher, broadcasting/film/video in the Last Year: Never true  . Ran Out of Food in the Last Year: Never true  Transportation Needs: Not on file  Physical Activity: Not on file  Stress: Not on file  Social Connections: Not on file  Intimate Partner Violence: Not on file    Ms. Tallarico's family history includes ADD / ADHD in her brother; Alcohol abuse in her maternal uncle and mother; Anxiety disorder in her father, maternal grandmother, and mother; Breast cancer (age of onset: 36) in her paternal grandmother; COPD in her father and mother; Cancer in her mother; Depression in her maternal grandmother and mother; Drug abuse in her father; Stroke in her mother.      Objective:    Vitals:   10/16/20 1534  BP: 100/60  Pulse: 92  SpO2: 98%    Physical Exam Well-developed  Thin WF  in no acute distress.  Height, Weight,100  BMI 17.8  HEENT; nontraumatic normocephalic, EOMI, PE R LA, sclera anicteric. Oropharynx;not done Neck; supple, no JVD Cardiovascular; regular rate and rhythm with S1-S2, no murmur rub or gallop Pulmonary; Clear bilaterally Abdomen; soft, nontender, nondistended, no palpable mass or hepatosplenomegaly, bowel sounds are active, midline scar Rectal;not done Skin; benign exam, no jaundice rash or appreciable lesions Extremities; no clubbing cyanosis or edema skin warm and dry Neuro/Psych; alert and oriented x4, grossly nonfocal mood and affect appropriate       Assessment & Plan:   #38 45 year old white female status post remote gastrojejunostomy for SMA syndrome with chronic nausea.  Symptoms are stable and somewhat improved since last visit. Recent CT scan with no findings to explain nausea and EGD December 2021 -. #2 intermittent sharp epigastric pain radiating to the lower abdomen-distended gallbladder without stones on CT, doubt biliary colic but will rule out. I think her episodic pain is more IBS related  #3 chronic constipation-constipation  may be driving some of her abdominal pain as well #4 mildly dilated main pancreatic duct 5 mm (not changed on imaging since 2016) Recent CT with suggestion of MRCP in 6 months  #5 sacral nerve stimulator in situ 6.  Anorexia nervosa 7.  ADD 8.  History of anxiety/depression 9.  GERD stable  Plan continue MiraLAX but increase to twice daily dosing Patient will do a MiraLAX purge at some point within the next week then resume MiraLAX twice daily We will give  a trial off Bentyl as this does not seem to be helping a lot for abdominal cramping and is likely worsening constipation Continue omeprazole 40 mg p.o. every morning Continue Phenergan 25 mg p.o. every morning, twice daily as needed Scheduled for upper abdominal ultrasound Consider trial of Trulance if constipation continues to be an issue despite twice daily MiraLAX. Patient cannot have an MRI due to sacral nerve stimulator, will plan for pancreas protocol CT August 2022.  Jadea Shiffer Oswald Hillock PA-C 10/16/2020   Cc: Corwin Levins, MD

## 2020-10-17 ENCOUNTER — Ambulatory Visit: Payer: Medicare Other | Admitting: Neurology

## 2020-10-17 ENCOUNTER — Other Ambulatory Visit: Payer: Self-pay | Admitting: Internal Medicine

## 2020-10-17 NOTE — Progress Notes (Signed)
GI PA assessment and plans noted

## 2020-10-20 ENCOUNTER — Ambulatory Visit (HOSPITAL_BASED_OUTPATIENT_CLINIC_OR_DEPARTMENT_OTHER)
Admission: RE | Admit: 2020-10-20 | Discharge: 2020-10-20 | Disposition: A | Discharge status: Home / Self Care | Payer: Medicare Other | Source: Ambulatory Visit | Attending: Physician Assistant | Admitting: Physician Assistant

## 2020-10-20 ENCOUNTER — Other Ambulatory Visit: Payer: Self-pay

## 2020-10-20 DIAGNOSIS — R109 Unspecified abdominal pain: Secondary | ICD-10-CM | POA: Diagnosis not present

## 2020-10-20 DIAGNOSIS — K589 Irritable bowel syndrome without diarrhea: Secondary | ICD-10-CM | POA: Insufficient documentation

## 2020-10-20 DIAGNOSIS — K5909 Other constipation: Secondary | ICD-10-CM | POA: Diagnosis not present

## 2020-10-20 DIAGNOSIS — R1013 Epigastric pain: Secondary | ICD-10-CM | POA: Diagnosis not present

## 2020-10-23 DIAGNOSIS — M419 Scoliosis, unspecified: Secondary | ICD-10-CM | POA: Diagnosis not present

## 2020-10-23 DIAGNOSIS — M25539 Pain in unspecified wrist: Secondary | ICD-10-CM | POA: Diagnosis not present

## 2020-10-23 DIAGNOSIS — E785 Hyperlipidemia, unspecified: Secondary | ICD-10-CM | POA: Diagnosis not present

## 2020-10-23 DIAGNOSIS — K219 Gastro-esophageal reflux disease without esophagitis: Secondary | ICD-10-CM | POA: Diagnosis not present

## 2020-10-23 DIAGNOSIS — M8580 Other specified disorders of bone density and structure, unspecified site: Secondary | ICD-10-CM | POA: Diagnosis not present

## 2020-10-23 DIAGNOSIS — M1990 Unspecified osteoarthritis, unspecified site: Secondary | ICD-10-CM | POA: Diagnosis not present

## 2020-10-25 ENCOUNTER — Encounter: Payer: Self-pay | Admitting: Registered"

## 2020-10-25 ENCOUNTER — Encounter: Payer: Medicare Other | Admitting: Registered"

## 2020-10-25 ENCOUNTER — Other Ambulatory Visit: Payer: Self-pay

## 2020-10-25 DIAGNOSIS — Z713 Dietary counseling and surveillance: Secondary | ICD-10-CM | POA: Diagnosis not present

## 2020-10-25 DIAGNOSIS — R634 Abnormal weight loss: Secondary | ICD-10-CM | POA: Diagnosis not present

## 2020-10-25 NOTE — Progress Notes (Signed)
Appointment start time: 4:09  Appointment end time: 5:12  Patient was seen on 10/25/2020 for nutrition counseling pertaining to disordered eating  Primary care provider: Oliver Barre, MD Therapist: none; has some numbers to call   ROI: N/A Any other medical team members: Chriss Driver, MD (psychiatrist)   Assessment  Pt states she is in the process of getting connected with therapist, Gertie Fey. Reports she is having carpal tunnel tests done. Reports she saw rheumatologist, all tests negative. Has an upcoming nerve test on 4/18.  States she has been having a lot of stomach pain. Reports keeping an eye on pancreatic duct and having tests run. Reports it may be IBS. States recent bone scan reveals presence of osteopenia. Shows left hip is more significant than right hip; also present in spine.   States she is trying to consume more water but its hard. States she eats dinner alone mostly unless having dinner at friends' parents house.    Previous appt: Visit with me was 08/2018 and pt was weighing ~104.5 lbs. States she's losing a lot of weight now. Weighed 99 lbs with clothes on at appt yesterday, 09/19/20. States she is having a lot of nausea. Taking medications daily, promethazine and Zofran to help with nausea. Reports sometimes she will add in chewable Pepto Bismol as needed.   Reports she likes to snack at night. States she loves [place] although they are hot. Does not drink water, mostly drinks soda and coffee.   Growth Metrics: Goal rate of weight gain: 0.5-1.0 lb/week  Eating history: Length of time: age 6 - early 55's. Previous treatments:  Goals for RD meetings:   Weight history:  Recent weight: 101 lbs Weight changes: maintained weight from previous visit 2 weeks ago (10/12/20) Highest weight: 115 (6 years ago)    Lowest weight: 80  Most consistent weight: 105   How has weight changed in the past year: recently has dropped  Medical Information:  Changes in hair, skin,  nails since ED started: no Chewing/swallowing difficulties: no Reflux or heartburn: yes, taking protonix Trouble with teeth: no LMP without the use of hormones: 3/3  Weight at that point: unsure Effect of exercise on menses: N/A   Effect of hormones on menses: monthly cycle Constipation, diarrhea: yes, constipation has BM once a day Dizziness/lightheadedness: no Headaches/body aches: yes, headaches; takes migraine medications Heart racing/chest pain: heart racing, increased HR due to anxiety, normal for her Mood: some depression present  Sleep: sleeps 8-8.5 hrs/night Focus/concentration: no Cold intolerance: no Vision changes: yes, has eye exam 3/28  Mental health diagnosis: previous history of AN   Dietary assessment: A typical day consists of 3 meals and 1-2 snacks  Safe foods include: all; not a picky eater Avoided foods include: none  24 hour recall:  B (10:30 am): blueberry english muffin + butter + 2 eggs + orange  S: nonfat black cherry yogurt + granola L (3:30 pm): ham & cheese sandwich with mustard + handful chips + carrots + 1/2 can soda S: apple + PB D (8:30 pm): 2 stuffed shells + side salad (tomato, cheese, lettuce) + apple/prunes with balsamic vinaigrette  S: can of Coke S (11 pm): individual size bag of chips  Beverages: coffee, soda (2*12 oz; 24 oz)  What Methods Do You Use To Control Your Weight (Compensatory behaviors)?  none  Estimated energy intake: 2000-2100 kcal  Estimated energy needs: 2000-2400 kcal 250-300 g CHO 100-120 g pro 67-80 g fat  Nutrition Diagnosis: NB-1.5 Disordered eating  pattern As related to history of anorexia nervosa.  As evidenced by pt reported.  Intervention/Goals: Pt was encouraged with changes made from previous appt. Discussed role of increasing water intake and ways to do this. Discussed difference in types of yogurt. Pt was in agreement with goals listed. Goals: - Have bottle of water while feeding nephew. Continue  to have bottle of water at night.  - Replace nonfat yogurt with whole milk yogurt.  - Continue to follow plate method for meals. Aim for 1/2 plate starch or grain with meals.  Meal plan:    3 meals    2 snacks   Monitoring and Evaluation: Patient will follow up in 2 weeks.

## 2020-10-25 NOTE — Patient Instructions (Signed)
-   Have bottle of water while feeding nephew. Continue to have bottle of water at night.   - Replace nonfat yogurt with whole milk yogurt.   - Continue to follow plate method for meals. Aim for 1/2 plate starch or grain with meals.

## 2020-10-26 ENCOUNTER — Telehealth: Payer: Self-pay | Admitting: Physician Assistant

## 2020-10-26 ENCOUNTER — Encounter: Payer: Self-pay | Admitting: Internal Medicine

## 2020-10-26 ENCOUNTER — Other Ambulatory Visit: Payer: Self-pay

## 2020-10-26 ENCOUNTER — Ambulatory Visit (INDEPENDENT_AMBULATORY_CARE_PROVIDER_SITE_OTHER): Payer: Medicare Other | Admitting: Internal Medicine

## 2020-10-26 VITALS — BP 100/66 | HR 90 | Ht 62.75 in | Wt 101.0 lb

## 2020-10-26 DIAGNOSIS — R634 Abnormal weight loss: Secondary | ICD-10-CM | POA: Diagnosis not present

## 2020-10-26 DIAGNOSIS — F5000 Anorexia nervosa, unspecified: Secondary | ICD-10-CM | POA: Diagnosis not present

## 2020-10-26 DIAGNOSIS — R739 Hyperglycemia, unspecified: Secondary | ICD-10-CM | POA: Diagnosis not present

## 2020-10-26 DIAGNOSIS — E559 Vitamin D deficiency, unspecified: Secondary | ICD-10-CM | POA: Diagnosis not present

## 2020-10-26 DIAGNOSIS — M419 Scoliosis, unspecified: Secondary | ICD-10-CM | POA: Insufficient documentation

## 2020-10-26 DIAGNOSIS — M1990 Unspecified osteoarthritis, unspecified site: Secondary | ICD-10-CM | POA: Insufficient documentation

## 2020-10-26 DIAGNOSIS — M8589 Other specified disorders of bone density and structure, multiple sites: Secondary | ICD-10-CM

## 2020-10-26 DIAGNOSIS — M069 Rheumatoid arthritis, unspecified: Secondary | ICD-10-CM

## 2020-10-26 DIAGNOSIS — K219 Gastro-esophageal reflux disease without esophagitis: Secondary | ICD-10-CM | POA: Insufficient documentation

## 2020-10-26 LAB — VITAMIN D 25 HYDROXY (VIT D DEFICIENCY, FRACTURES): VITD: 21.67 ng/mL — ABNORMAL LOW (ref 30.00–100.00)

## 2020-10-26 NOTE — Telephone Encounter (Signed)
Patient is returning a call for CT results.

## 2020-10-26 NOTE — Telephone Encounter (Signed)
Called patient back, she wanted the [place] results, which I  Gave her. She states she is still having nausea (but no emesis) and the Zofran helps a little. Also states she still has Sharp abdominal pain almost daily and the only thing that helps (sometimes) with that is taking a hot bath.

## 2020-10-26 NOTE — Progress Notes (Signed)
Patient ID: Karen Hahn, female   DOB: [birth year], 45 y.o.   MRN: [id number]        Chief Complaint: osteopenia, anorexia, wt loss, hyperglycemia       HPI:  Karen Hahn is a 45 y.o. female here with recent dxa per GYN with results of worst T score of -2.3 at the left femoral hip, c/w osteopenia.  Handwritten notes per gyn on the document indicate to f/u here to consider possible fosamax vs reclast.  Most recent Vitamin D level was low and due for f/u, pt not taking Vit D, no prior PTH levels.  Last calcium normal feb 2022.  Pt has long hx of anxiety, anorexia, underwt/wt loss and GI intolerance, Gerd,and also hx of cannabis abuse. Most recently has been stable - Denies worsening reflux, abd pain, dysphagia, n/v, bowel change or blood.  Wt has increased several lbs but still wary of eating in general or new medications. Has also new diagnosis RA but not currently on steroid therapy   Pt denies chest pain, increased sob or doe, wheezing, orthopnea, PND, increased LE swelling, palpitations, dizziness or syncope.   Pt denies polydipsia, polyuria, denies new worsening neuro s/s.   Pt denies fever, wt loss, night sweats, loss of appetite, or other constitutional symptoms   Denies worsening depressive symptoms, suicidal ideation, or panic; has ongoing anxiety       Wt Readings from Last 3 Encounters:  10/26/20 101 lb (45.8 kg)  10/16/20 100 lb (45.4 kg)  10/03/20 98 lb 12.8 oz (44.8 kg)   BP Readings from Last 3 Encounters:  10/26/20 100/66  10/16/20 100/60  10/03/20 (!) 110/58         Past Medical History:  Diagnosis Date  . ADD (attention deficit disorder)   . Allergy   . Anorexia nervosa without bulimia   . Chronic constipation   . Drug abuse (HCC)   . GAD (generalized anxiety disorder)   . GERD (gastroesophageal reflux disease)   . History of gastric restrictive surgery   . History of gastric ulcer   . History of Helicobacter pylori infection    after 2003  . HLD  (hyperlipidemia) 02/16/2018  . Impulse control disorder   . Moderate major depression (HCC)   . Osteopenia   . Osteoporosis   . Post concussion syndrome    fell 10/20/2015  . SMAS (superior mesenteric artery syndrome) (HCC)   . Urge urinary incontinence    Past Surgical History:  Procedure Laterality Date  . DILATION AND CURETTAGE OF UTERUS  2005  . INTERSTIM IMPLANT PLACEMENT N/A 10/18/2015   Procedure: [name] IMPLANT FIRST STAGE;  Surgeon: Jerilee Field, MD;  Location: [facility];  Service: Urology;  Laterality: N/A;  . INTERSTIM IMPLANT PLACEMENT N/A 10/18/2015   Procedure: [name] IMPLANT SECOND STAGE;  Surgeon: Jerilee Field, MD;  Location[facility];  Service: Urology;  Laterality: N/A;  . LAPAROSCOPIC GASTRIC RESTRICTIVE DUODENAL PROCEDURE (DUODENAL SWITCH)  12/ 2003  . ORIF RIGHT WRIST FX  2013   REMOVAL HARDWARE X2 in  2013--  No retained hardware    reports that she quit smoking about 3 years ago. Her smoking use included cigarettes. She has a 0.50 pack-year smoking history. She has never used smokeless tobacco. She reports that she does not drink alcohol and does not use drugs. family history includes ADD / ADHD in her brother; Alcohol abuse in her maternal uncle and mother; Anxiety disorder in her father, maternal grandmother,  and mother; Breast cancer (age of onset: 24) in her paternal grandmother; COPD in her father and mother; Cancer in her mother; Depression in her maternal grandmother and mother; Drug abuse in her father; Stroke in her mother. Allergies  Allergen Reactions  . Bactrim [Sulfamethoxazole-Trimethoprim] Other (See Comments)    Yeast infection  . Penicillins Other (See Comments)    Has patient had a PCN reaction causing immediate rash, facial/tongue/throat swelling, SOB or lightheadedness with hypotension: NO (UTI, YEAST INFECTION) Has patient had a PCN reaction causing severe rash involving mucus membranes or skin  necrosis: NO Has patient had a PCN reaction that required hospitalization NO Has patient had a PCN reaction occurring within the last 10 years: NO If all of the above answers are "NO", then may proceed with Cephalosporin use.   . Ciprofloxacin Itching  . Morphine And Related Itching and Other (See Comments)    "whole body feels tight feeling"  . Reglan [Metoclopramide]   . Benadryl [Diphenhydramine Hcl] Palpitations  . Diflucan [Fluconazole] Rash   Current Outpatient Medications on File Prior to Visit  Medication Sig Dispense Refill  . calcium carbonate (SUPER CALCIUM) 1500 (600 Ca) MG TABS tablet 1 tablet with meals    . Calcium-Iron-Vit D-Vit K 500-08-998-40 MG-UNT-MCG CHEW Chew by mouth daily.    Dorise Hiss (AIMOVIG) 140 MG/ML SOAJ Inject 140 mg into the skin every 30 (thirty) days. 1 pen 11  . ferrous gluconate (FERGON) 324 MG tablet Take 1 tablet (324 mg total) by mouth daily with breakfast. 90 tablet 3  . ibuprofen (ADVIL) 600 MG tablet TAKE 1 TABLET(600 MG) BY MOUTH EVERY 6 HOURS AS NEEDED 60 tablet 0  . lamoTRIgine (LAMICTAL) 150 MG tablet TAKE 2 TABLETS(300 MG) BY MOUTH DAILY 180 tablet 0  . Multiple Vitamins-Minerals (MULTIVITAMIN GUMMIES ADULT) CHEW Chew by mouth.    . naproxen (NAPROSYN) 500 MG tablet Take 1 tablet (500 mg total) by mouth 2 (two) times daily as needed. 20 tablet 3  . norethindrone-ethinyl estradiol (LOESTRIN) 1-20 MG-MCG tablet Take 1 tablet by mouth daily. Start 1st Sunday after menstrual period begins 84 tablet 4  . omeprazole (PRILOSEC) 40 MG capsule Take 1 capsule (40 mg total) by mouth daily. 90 capsule 3  . ondansetron (ZOFRAN) 4 MG tablet Take 1 tablet (4 mg total) by mouth every 8 (eight) hours as needed for nausea or vomiting. 30 tablet 2  . PARoxetine (PAXIL) 10 MG tablet Take 1 tablet (10 mg total) by mouth daily. 90 tablet 3  . promethazine (PHENERGAN) 25 MG tablet TAKE 1 TABLET(25 MG) BY MOUTH TWICE DAILY AS NEEDED 60 tablet 2  . rizatriptan  (MAXALT-MLT) 10 MG disintegrating tablet TAKE 1 TABLET BY MOUTH AS NEEDED FOR MIGRANE. MAY REPEAT IN 2 HOURS IF NEEDED 10 tablet 2  . rosuvastatin (CRESTOR) 20 MG tablet TAKE 1 TABLET(20 MG) BY MOUTH DAILY 90 tablet 3  . UBRELVY 100 MG TABS TAKE 1 TABLET BY MOUTH AS NEEDED(MAY REPEAT DOSE AFTER 2 HOURS) MAXIMUM 2 TABLETS IN 24 HOURS 16 tablet 0  . dicyclomine (BENTYL) 10 MG/ML injection 1 ml     No current facility-administered medications on file prior to visit.        ROS:  All others reviewed and negative.  Objective        PE:  BP 100/66   Pulse 90   Ht 5' 2.75" (1.594 m)   Wt 101 lb (45.8 kg)   SpO2 96%   BMI 18.03 kg/m  Constitutional: Pt appears in NAD               HENT: Head: NCAT.                Right Ear: External ear normal.                 Left Ear: External ear normal.                Eyes: . Pupils are equal, round, and reactive to light. Conjunctivae and EOM are normal               Nose: without d/c or deformity               Neck: Neck supple. Gross normal ROM               Cardiovascular: Normal rate and regular rhythm.                 Pulmonary/Chest: Effort normal and breath sounds without rales or wheezing.                Abd:  Soft, NT, ND, + BS, no organomegaly               Neurological: Pt is alert. At baseline orientation, motor grossly intact               Skin: Skin is warm. No rashes, no other new lesions, LE edema - none               Psychiatric: Pt behavior is normal without agitation   Micro: none  Cardiac tracings I have personally interpreted today:  none  Pertinent Radiological findings (summarize): none   Lab Results  Component Value Date   WBC 7.4 09/18/2020   HGB 12.0 09/18/2020   HCT 35.3 (L) 09/18/2020   PLT 259.0 09/18/2020   GLUCOSE 96 09/18/2020   CHOL 180 02/21/2020   TRIG 93.0 02/21/2020   HDL 56.20 02/21/2020   LDLCALC 105 (H) 02/21/2020   ALT 29 09/18/2020   AST 25 09/18/2020   NA 137 09/18/2020   K  4.6 09/18/2020   CL 103 09/18/2020   CREATININE 0.63 09/18/2020   BUN 13 09/18/2020   CO2 29 09/18/2020   TSH 1.76 01/07/2020   HGBA1C 5.1 02/18/2019   Assessment/Plan:  Karen Hahn is a 45 y.o. White or Caucasian [1] female with  has a past medical history of ADD (attention deficit disorder), Allergy, Anorexia nervosa without bulimia, Chronic constipation, Drug abuse (HCC), GAD (generalized anxiety disorder), GERD (gastroesophageal reflux disease), History of gastric restrictive surgery, History of gastric ulcer, History of Helicobacter pylori infection, HLD (hyperlipidemia) (02/16/2018), Impulse control disorder, Moderate major depression (HCC), Osteopenia, Osteoporosis, Post concussion syndrome, SMAS (superior mesenteric artery syndrome) (HCC), and Urge urinary incontinence.  Anorexia nervosa Stable overall, cont current management  Hyperglycemia Lab Results  Component Value Date   HGBA1C 5.1 02/18/2019   Stable, pt to continue current medical treatment  - diet  Current Outpatient Medications (Endocrine & Metabolic):  .  norethindrone-ethinyl estradiol (LOESTRIN) 1-20 MG-MCG tablet, Take 1 tablet by mouth daily. Start 1st Sunday after menstrual period begins  Current Outpatient Medications (Cardiovascular):  .  rosuvastatin (CRESTOR) 20 MG tablet, TAKE 1 TABLET(20 MG) BY MOUTH DAILY  Current Outpatient Medications (Respiratory):  .  promethazine (PHENERGAN) 25 MG tablet, TAKE 1 TABLET(25 MG) BY MOUTH TWICE DAILY AS NEEDED  Current Outpatient Medications (Analgesics):  Marland Kitchen  Dorise Hiss (  AIMOVIG) 140 MG/ML SOAJ, Inject 140 mg into the skin every 30 (thirty) days. Marland Kitchen  ibuprofen (ADVIL) 600 MG tablet, TAKE 1 TABLET(600 MG) BY MOUTH EVERY 6 HOURS AS NEEDED .  naproxen (NAPROSYN) 500 MG tablet, Take 1 tablet (500 mg total) by mouth 2 (two) times daily as needed. .  rizatriptan (MAXALT-MLT) 10 MG disintegrating tablet, TAKE 1 TABLET BY MOUTH AS NEEDED FOR MIGRANE. MAY REPEAT IN 2  HOURS IF NEEDED .  UBRELVY 100 MG TABS, TAKE 1 TABLET BY MOUTH AS NEEDED(MAY REPEAT DOSE AFTER 2 HOURS) MAXIMUM 2 TABLETS IN 24 HOURS  Current Outpatient Medications (Hematological):  .  ferrous gluconate (FERGON) 324 MG tablet, Take 1 tablet (324 mg total) by mouth daily with breakfast.  Current Outpatient Medications (Other):  .  calcium carbonate (SUPER CALCIUM) 1500 (600 Ca) MG TABS tablet, 1 tablet with meals .  Calcium-Iron-Vit D-Vit K 500-08-998-40 MG-UNT-MCG CHEW, Chew by mouth daily. Marland Kitchen  lamoTRIgine (LAMICTAL) 150 MG tablet, TAKE 2 TABLETS(300 MG) BY MOUTH DAILY .  Multiple Vitamins-Minerals (MULTIVITAMIN GUMMIES ADULT) CHEW, Chew by mouth. Marland Kitchen  omeprazole (PRILOSEC) 40 MG capsule, Take 1 capsule (40 mg total) by mouth daily. .  ondansetron (ZOFRAN) 4 MG tablet, Take 1 tablet (4 mg total) by mouth every 8 (eight) hours as needed for nausea or vomiting. Marland Kitchen  PARoxetine (PAXIL) 10 MG tablet, Take 1 tablet (10 mg total) by mouth daily. Marland Kitchen  dicyclomine (BENTYL) 10 MG/ML injection, 1 ml   Osteopenia Near osteoporosis at t score -2.3; advised pt to start oscal plus D bid, check vit d and pth levels, and avoid fosamax given high risk of adverse consequences of gi upset and intolerance; pt states will return to GYN for reclast as this was offered at her last visit for yearly iv therapy, with an eye to repeat DXA in 2-3 years.  Consider prolia for worsening.  Consider endo for any elevated PTH after vit d replaced.    Vitamin D deficiency Last vitamin D Lab Results  Component Value Date   VD25OH 21.67 (L) 10/26/2020   Low, to start oral replacement   Weight loss With some slight improvement recent, to continue work on increased calorie diet  Rheumatoid arthritis (HCC) No synovitis on exam today,  to f/u any worsening symptoms or concerns  Followup: Return if symptoms worsen or fail to improve.  Oliver Barre, MD 10/29/2020 9:45 AM [facility]

## 2020-10-26 NOTE — Telephone Encounter (Signed)
I would like her to have a  follow up with Dr Marina Goodell - first available, thanks

## 2020-10-26 NOTE — Patient Instructions (Signed)
I would advise to try to get the Reclast IV once yearly for your bone loss  Please continue all other medications as before, and refills have been done if requested.  Please have the pharmacy call with any other refills you may need.  Please continue your efforts at being more active, low cholesterol diet, and weight control.  Please keep your appointments with your specialists as you may have planned  Please go to the LAB at the blood drawing area for the tests to be done  You will be contacted by phone if any changes need to be made immediately.  Otherwise, you will receive a letter about your results with an explanation, but please check with MyChart first.  Please remember to sign up for MyChart if you have not done so, as this will be important to you in the future with finding out test results, communicating by private email, and scheduling acute appointments online when needed.

## 2020-10-27 ENCOUNTER — Encounter: Payer: Self-pay | Admitting: Neurology

## 2020-10-27 NOTE — Telephone Encounter (Signed)
Scheduled office visit with Dr. Marina Goodell on 11/13/20. Patient aware.

## 2020-10-29 ENCOUNTER — Encounter: Payer: Self-pay | Admitting: Internal Medicine

## 2020-10-29 LAB — PTH, INTACT AND CALCIUM
Calcium: 8.9 mg/dL (ref 8.6–10.2)
PTH: 55 pg/mL (ref 16–77)

## 2020-10-29 NOTE — Assessment & Plan Note (Signed)
With some slight improvement recent, to continue work on increased calorie diet

## 2020-10-29 NOTE — Assessment & Plan Note (Signed)
Lab Results  Component Value Date   HGBA1C 5.1 02/18/2019   Stable, pt to continue current medical treatment  - diet  Current Outpatient Medications (Endocrine & Metabolic):  .  norethindrone-ethinyl estradiol (LOESTRIN) 1-20 MG-MCG tablet, Take 1 tablet by mouth daily. Start 1st Sunday after menstrual period begins  Current Outpatient Medications (Cardiovascular):  .  rosuvastatin (CRESTOR) 20 MG tablet, TAKE 1 TABLET(20 MG) BY MOUTH DAILY  Current Outpatient Medications (Respiratory):  .  promethazine (PHENERGAN) 25 MG tablet, TAKE 1 TABLET(25 MG) BY MOUTH TWICE DAILY AS NEEDED  Current Outpatient Medications (Analgesics):  Marland Kitchen  Erenumab-aooe (AIMOVIG) 140 MG/ML SOAJ, Inject 140 mg into the skin every 30 (thirty) days. Marland Kitchen  ibuprofen (ADVIL) 600 MG tablet, TAKE 1 TABLET(600 MG) BY MOUTH EVERY 6 HOURS AS NEEDED .  naproxen (NAPROSYN) 500 MG tablet, Take 1 tablet (500 mg total) by mouth 2 (two) times daily as needed. .  rizatriptan (MAXALT-MLT) 10 MG disintegrating tablet, TAKE 1 TABLET BY MOUTH AS NEEDED FOR MIGRANE. MAY REPEAT IN 2 HOURS IF NEEDED .  UBRELVY 100 MG TABS, TAKE 1 TABLET BY MOUTH AS NEEDED(MAY REPEAT DOSE AFTER 2 HOURS) MAXIMUM 2 TABLETS IN 24 HOURS  Current Outpatient Medications (Hematological):  .  ferrous gluconate (FERGON) 324 MG tablet, Take 1 tablet (324 mg total) by mouth daily with breakfast.  Current Outpatient Medications (Other):  .  calcium carbonate (SUPER CALCIUM) 1500 (600 Ca) MG TABS tablet, 1 tablet with meals .  Calcium-Iron-Vit D-Vit K 500-08-998-40 MG-UNT-MCG CHEW, Chew by mouth daily. Marland Kitchen  lamoTRIgine (LAMICTAL) 150 MG tablet, TAKE 2 TABLETS(300 MG) BY MOUTH DAILY .  Multiple Vitamins-Minerals (MULTIVITAMIN GUMMIES ADULT) CHEW, Chew by mouth. Marland Kitchen  omeprazole (PRILOSEC) 40 MG capsule, Take 1 capsule (40 mg total) by mouth daily. .  ondansetron (ZOFRAN) 4 MG tablet, Take 1 tablet (4 mg total) by mouth every 8 (eight) hours as needed for nausea or  vomiting. Marland Kitchen  PARoxetine (PAXIL) 10 MG tablet, Take 1 tablet (10 mg total) by mouth daily. Marland Kitchen  dicyclomine (BENTYL) 10 MG/ML injection, 1 ml

## 2020-10-29 NOTE — Assessment & Plan Note (Signed)
Near osteoporosis at t score -2.3; advised pt to start oscal plus D bid, check vit d and pth levels, and avoid fosamax given high risk of adverse consequences of gi upset and intolerance; pt states will return to GYN for reclast as this was offered at her last visit for yearly iv therapy, with an eye to repeat DXA in 2-3 years.  Consider prolia for worsening.  Consider endo for any elevated PTH after vit d replaced.

## 2020-10-29 NOTE — Assessment & Plan Note (Signed)
No synovitis on exam today,  to f/u any worsening symptoms or concerns

## 2020-10-29 NOTE — Assessment & Plan Note (Signed)
Last vitamin D Lab Results  Component Value Date   VD25OH 21.67 (L) 10/26/2020   Low, to start oral replacement

## 2020-10-29 NOTE — Assessment & Plan Note (Signed)
Stable overall, cont current management

## 2020-10-30 ENCOUNTER — Telehealth: Payer: Self-pay

## 2020-10-30 ENCOUNTER — Encounter: Payer: Self-pay | Admitting: Internal Medicine

## 2020-10-30 DIAGNOSIS — L600 Ingrowing nail: Secondary | ICD-10-CM

## 2020-10-30 NOTE — Telephone Encounter (Signed)
Dr. Penni Bombard prescribed Loestrin 1/20 for patient at her 10/03/20 visit. Patient said she received a 21 day pack with no placebos and questions if she should stop for a week at end of 21 active pills? I did not see any mention in chart of her taking continuously and he was treated PMS sx. She is not sexually active.  Also, she thinks she might rather use D-P injection. Just have her schedule visit with you?

## 2020-10-31 DIAGNOSIS — H524 Presbyopia: Secondary | ICD-10-CM | POA: Diagnosis not present

## 2020-11-02 NOTE — Telephone Encounter (Signed)
Left message to call me.

## 2020-11-02 NOTE — Telephone Encounter (Signed)
Yes, visit with me for counseling on contraception and can receive DepoProvera at that time if decides to switch to it.  For the [facility], can stop for 5 days and start the new pack, this way she will have a shorter period.

## 2020-11-02 NOTE — Telephone Encounter (Signed)
Spoke with patient and informed her. She would like to schedule visit. Message sent to Toniann Fail to call her to arrange.

## 2020-11-07 ENCOUNTER — Other Ambulatory Visit: Payer: Self-pay

## 2020-11-07 ENCOUNTER — Encounter: Payer: Self-pay | Admitting: Podiatry

## 2020-11-07 ENCOUNTER — Ambulatory Visit (INDEPENDENT_AMBULATORY_CARE_PROVIDER_SITE_OTHER): Payer: Medicare Other | Admitting: Podiatry

## 2020-11-07 DIAGNOSIS — L600 Ingrowing nail: Secondary | ICD-10-CM | POA: Diagnosis not present

## 2020-11-07 NOTE — Patient Instructions (Signed)

## 2020-11-07 NOTE — Progress Notes (Signed)
  Subjective:  Patient ID: Karen Hahn, female    DOB: 23-Oct-1975,  MRN: [id number]  Chief Complaint  Patient presents with  . Ingrown Toenail    Nail check for ingrown     45 y.o. female presents with the above complaint. History confirmed with patient.  Has these recurrently Objective:  Physical Exam: warm, good capillary refill, no trophic changes or ulcerative lesions, normal DP and PT pulses and normal sensory exam.  Bilateral hallux before she has evidence of previous ingrowing nail, no current paronychia   Assessment:   1. Ingrowing left great toenail   2. Ingrowing right great toenail      Plan:  Patient was evaluated and treated and all questions answered.    Ingrown Nail, bilaterally -Patient elects to proceed with minor surgery to remove ingrown toenail today. Consent reviewed and signed by patient. -Ingrown nail excised. See procedure note. -Educated on post-procedure care including soaking. Written instructions provided and reviewed. -Patient to follow up in 2 weeks for nail check.  Procedure: Excision of Ingrown Toenail Location: Bilateral 1st toe medial and lateral nail borders. Anesthesia: Lidocaine 1% plain; 1.5 mL and Marcaine 0.5% plain; 1.5 mL, digital block. Skin Prep: Betadine. Dressing: Silvadene; telfa; dry, sterile, compression dressing. Technique: Following skin prep, the toe was exsanguinated and a tourniquet was secured at the base of the toe. The affected nail border was freed, split with a nail splitter, and excised. Chemical matrixectomy was then performed with phenol and irrigated out with alcohol. The tourniquet was then removed and sterile dressing applied. Disposition: Patient tolerated procedure well. Patient to return in 2 weeks for follow-up.     Return in about 2 weeks (around 11/21/2020) for nail re-check.

## 2020-11-09 ENCOUNTER — Encounter: Payer: Self-pay | Admitting: Registered"

## 2020-11-09 ENCOUNTER — Encounter: Payer: Medicare Other | Attending: Internal Medicine | Admitting: Registered"

## 2020-11-09 ENCOUNTER — Other Ambulatory Visit: Payer: Self-pay

## 2020-11-09 DIAGNOSIS — F5000 Anorexia nervosa, unspecified: Secondary | ICD-10-CM | POA: Diagnosis not present

## 2020-11-09 DIAGNOSIS — R634 Abnormal weight loss: Secondary | ICD-10-CM | POA: Diagnosis not present

## 2020-11-09 DIAGNOSIS — Z713 Dietary counseling and surveillance: Secondary | ICD-10-CM | POA: Insufficient documentation

## 2020-11-09 NOTE — Progress Notes (Signed)
Appointment start time: 4:08  Appointment end time: 4:45  Patient was seen on 11/09/2020 for nutrition counseling pertaining to disordered eating  Primary care provider: Oliver Barre, MD Therapist: none; has some numbers to call   ROI: N/A Any other medical team members: Chriss Driver, MD (psychiatrist)   Assessment  Pt states she is in the process of getting connected with therapist, Gertie Fey. Reports she has an appointment with GI doctor on Mon, 4/11 due to still having stomach pains at times. States she has been staying away from spicy foods but still consuming carbonation and caffeine daily. States she recognizes she needs to increase water intake.   States she has purchased whole milk yogurt and has been eating with frozen fruit; loves it.   Previous appt: Reports she saw rheumatologist, all tests negative. Has an upcoming nerve test on 4/18.  States she has been having a lot of stomach pain. Reports keeping an eye on pancreatic duct and having tests run. Reports it may be IBS. States recent bone scan reveals presence of osteopenia. Shows left hip is more significant than right hip; also present in spine.  Visit with me was 08/2018 and pt was weighing ~104.5 lbs.   Reports she likes to snack at night. States she loves [place] although they are hot. Does not drink water, mostly drinks soda and coffee.    Growth Metrics: Goal rate of weight gain: 0.5-1.0 lb/week  Eating history: Length of time: age 3 - early 59's. Previous treatments:  Goals for RD meetings:   Weight history:  Recent weight: 104.5 lbs Weight changes: +3.4 lbs from from 101 lbs previous visit 2 weeks ago (10/25/20) Highest weight: 115 (6 years ago)    Lowest weight: 80  Most consistent weight: 105   How has weight changed in the past year: recently has dropped  Medical Information:  Changes in hair, skin, nails since ED started: no Chewing/swallowing difficulties: no Reflux or heartburn: yes, taking  protonix Trouble with teeth: no LMP without the use of hormones: 3/3  Weight at that point: unsure Effect of exercise on menses: N/A   Effect of hormones on menses: monthly cycle Constipation, diarrhea: yes, constipation has BM once a day Dizziness/lightheadedness: no Headaches/body aches: not often, gets monthly shots for management Heart racing/chest pain: heart racing, increased HR due to anxiety, normal for her Mood: some depression present  Sleep: sleeps 8-8.5 hrs/night Focus/concentration: no Cold intolerance: no Vision changes: yes, had eye exam 3/28 awaiting new glasses  Mental health diagnosis: previous history of AN   Dietary assessment: A typical day consists of 3 meals and 1-2 snacks  Safe foods include: all; not a picky eater Avoided foods include: none  24 hour recall:  B (10:30 am): 2 slices of toast + butter + 2 eggs + shredded cheese + orange  S: L (3:30 pm): ham & cheese sandwich with balsamic dressing + broccoli + handful chips + 1/2 can soda S:  D (8:30 pm): frozen bean burrito + mixed fruit + side salad (tomato, cheese, lettuce) + french fries S: whole milk yogurt + fruit S (11 pm): pretzels + vinegar chips  Beverages: coffee, soda (2*12 oz; 24 oz)  What Methods Do You Use To Control Your Weight (Compensatory behaviors)?  none  Estimated energy intake: 2000-2100 kcal  Estimated energy needs: 2000-2400 kcal 250-300 g CHO 100-120 g pro 67-80 g fat  Nutrition Diagnosis: NB-1.5 Disordered eating pattern As related to history of anorexia nervosa.  As evidenced by  pt reported.  Intervention/Goals: Pt was encouraged with changes made from previous appt. Discussed role of increasing water intake and ways to do this. Pt agreed with goals listed.  Goals: - Continue to aim for 3 meals + 2-3 snacks a day.  [employer]ie [employer] job having whole milk yogurt option with fruit! - Aim to increase water intake to 1 bottle a day.   Meal plan:    3 meals    2  snacks   Monitoring and Evaluation: Patient will follow up in 3 weeks.

## 2020-11-09 NOTE — Patient Instructions (Signed)
-   Continue to aim for 3 meals +2-3 snacks a day.   Karen Hahn job having whole milk yogurt option with fruit!  - Aim to increase water intake to 1 bottle a day.

## 2020-11-13 ENCOUNTER — Other Ambulatory Visit: Payer: Self-pay

## 2020-11-13 ENCOUNTER — Ambulatory Visit (INDEPENDENT_AMBULATORY_CARE_PROVIDER_SITE_OTHER): Payer: Medicare Other | Admitting: Internal Medicine

## 2020-11-13 ENCOUNTER — Encounter: Payer: Self-pay | Admitting: Internal Medicine

## 2020-11-13 VITALS — BP 102/62 | HR 97 | Ht 62.75 in | Wt 101.4 lb

## 2020-11-13 DIAGNOSIS — K5900 Constipation, unspecified: Secondary | ICD-10-CM | POA: Diagnosis not present

## 2020-11-13 DIAGNOSIS — R112 Nausea with vomiting, unspecified: Secondary | ICD-10-CM

## 2020-11-13 DIAGNOSIS — R109 Unspecified abdominal pain: Secondary | ICD-10-CM

## 2020-11-13 NOTE — Progress Notes (Signed)
HISTORY OF PRESENT ILLNESS:  Karen Hahn is a 45 y.o. female who presents today for follow-up regarding chronic abdominal pain, constipation, and nausea.  She was initially evaluated October 2019 regarding inability to gain weight.  She has a history of anorexia nervosa, generalized anxiety disorder, impulse control disorder, and borderline personality traits with depression.  She also has a history of SMA syndrome and underwent surgical gastrojejunostomy.  GI evaluations with Dr. Carman Ching.  Colonoscopy May 16, 2015 was normal.  Upper endoscopy May 16, 2015 was normal.  Patient was seen March by the GI physician assistant regarding these same complaints.  September 26, 2020 she underwent CT scan.  No acute abnormalities.  Recent blood work has been unremarkable.  Her weight has been stable.  She tells me now that she has significant constipation.  Has a bowel movement once every few days.  She strains.  Occasional minor rectal bleeding with passage of hard stools.  For nausea she takes Phenergan daily and Zofran as needed.  She is also on omeprazole for dyspeptic symptoms.  No longer using NSAIDs.  She thinks that she had tried Linzess years ago but this resulted in diarrhea.  Dosage uncertain  REVIEW OF SYSTEMS:  All non-GI ROS negative unless otherwise stated in the HPI except for back pain, headaches, muscle cramps, night sweats  Past Medical History:  Diagnosis Date  . ADD (attention deficit disorder)   . Allergy   . Anorexia nervosa without bulimia   . Chronic constipation   . Drug abuse (HCC)   . GAD (generalized anxiety disorder)   . GERD (gastroesophageal reflux disease)   . History of gastric restrictive surgery   . History of gastric ulcer   . History of Helicobacter pylori infection    after 2003  . HLD (hyperlipidemia) 02/16/2018  . Impulse control disorder   . Moderate major depression (HCC)   . Osteopenia   . Osteoporosis   . Post concussion syndrome    fell  10/20/2015  . SMAS (superior mesenteric artery syndrome) (HCC)   . Urge urinary incontinence     Past Surgical History:  Procedure Laterality Date  . DILATION AND CURETTAGE OF UTERUS  2005  . INTERSTIM IMPLANT PLACEMENT N/A 10/18/2015   Procedure: [name] IMPLANT FIRST STAGE;  Surgeon: Jerilee Field, MD;  Location: [facility];  Service: Urology;  Laterality: N/A;  . INTERSTIM IMPLANT PLACEMENT N/A 10/18/2015   Procedure: [name] IMPLANT SECOND STAGE;  Surgeon: Jerilee Field, MD;  Location: [facility];  Service: Urology;  Laterality: N/A;  . LAPAROSCOPIC GASTRIC RESTRICTIVE DUODENAL PROCEDURE (DUODENAL SWITCH)  12/ 2003  . ORIF RIGHT WRIST FX  2013   REMOVAL HARDWARE X2 in  2013--  No retained hardware    Social History Dashley A Buske  reports that she quit smoking about 3 years ago. Her smoking use included cigarettes. She has a 0.50 pack-year smoking history. She has never used smokeless tobacco. She reports that she does not drink alcohol and does not use drugs.  family history includes ADD / ADHD in her brother; Alcohol abuse in her maternal uncle and mother; Anxiety disorder in her father, maternal grandmother, and mother; Breast cancer (age of onset: 63) in her paternal grandmother; COPD in her father and mother; Cancer in her mother; Depression in her maternal grandmother and mother; Drug abuse in her father; Stroke in her mother.  Allergies  Allergen Reactions  . Bactrim [Sulfamethoxazole-Trimethoprim] Other (See Comments)    Yeast  infection  . Penicillins Other (See Comments)    Has patient had a PCN reaction causing immediate rash, facial/tongue/throat swelling, SOB or lightheadedness with hypotension: NO (UTI, YEAST INFECTION) Has patient had a PCN reaction causing severe rash involving mucus membranes or skin necrosis: NO Has patient had a PCN reaction that required hospitalization NO Has patient had a PCN reaction occurring within the  last 10 years: NO If all of the above answers are "NO", then may proceed with Cephalosporin use.   . Ciprofloxacin Itching  . Morphine And Related Itching and Other (See Comments)    "whole body feels tight feeling"  . Reglan [Metoclopramide]   . Benadryl [Diphenhydramine Hcl] Palpitations  . Diflucan [Fluconazole] Rash       PHYSICAL EXAMINATION: Vital signs: BP 102/62   Pulse 97   Ht 5' 2.75" (1.594 m)   Wt 101 lb 6.4 oz (46 kg)   SpO2 98%   BMI 18.11 kg/m   Constitutional: generally well-appearing, no acute distress Psychiatric: alert and oriented x3, cooperative Eyes: extraocular movements intact, anicteric, conjunctiva pink Mouth: oral pharynx moist, no lesions Neck: supple no lymphadenopathy Cardiovascular: heart regular rate and rhythm, no murmur Lungs: clear to auscultation bilaterally Abdomen: soft, thin, nontender, nondistended, no obvious ascites, no peritoneal signs, normal bowel sounds, no organomegaly Rectal: Omitted Extremities: no clubbing, cyanosis, or lower extremity edema bilaterally Skin: no lesions on visible extremities Neuro: No focal deficits.  Cranial nerves intact  ASSESSMENT:  1.  Chronic abdominal pain.  Functional 2.  Chronic constipation. 3.  Chronic nausea 4.  Normal upper endoscopy and colonoscopy 2016. 5.  Normal upper endoscopy December 2021 with evidence of prior gastrojejunostomy 6.  Multiple behavioral health issues   PLAN:  1.  Continue PPI 2.  Antinausea medicine as needed 3.  Trial of Linzess 72 mcg daily.  May be adjusted based on effect.  She will provide feedback 4.  Routine colonoscopy around 2026 5.  Resume general medical care with PCP.  GI follow-up as needed

## 2020-11-13 NOTE — Patient Instructions (Signed)
You have been given some samples of Linzess 72 mcg.  Take 1 30 minutes before your first meal of the day

## 2020-11-16 ENCOUNTER — Other Ambulatory Visit: Payer: Self-pay

## 2020-11-16 MED ORDER — LUBIPROSTONE 8 MCG PO CAPS: 8.0000 ug | ORAL_CAPSULE | Freq: Two times a day (BID) | ORAL | 3 refills | Status: DC

## 2020-11-20 DIAGNOSIS — G5601 Carpal tunnel syndrome, right upper limb: Secondary | ICD-10-CM | POA: Diagnosis not present

## 2020-11-24 DIAGNOSIS — G5601 Carpal tunnel syndrome, right upper limb: Secondary | ICD-10-CM | POA: Insufficient documentation

## 2020-11-27 ENCOUNTER — Other Ambulatory Visit: Payer: Self-pay

## 2020-11-27 ENCOUNTER — Ambulatory Visit (INDEPENDENT_AMBULATORY_CARE_PROVIDER_SITE_OTHER): Payer: Medicare Other | Admitting: Podiatry

## 2020-11-27 DIAGNOSIS — L600 Ingrowing nail: Secondary | ICD-10-CM | POA: Diagnosis not present

## 2020-11-28 ENCOUNTER — Encounter: Payer: Medicare Other | Admitting: Registered"

## 2020-12-02 NOTE — Progress Notes (Signed)
  Subjective:  Patient ID: Karen Hahn, female    DOB: May 19, 1976,  MRN: [id number]  Follow-up for nail procedure  45 y.o. female presents with the above complaint. History confirmed with patient.   Objective:  Physical Exam: warm, good capillary refill, no trophic changes or ulcerative lesions, normal DP and PT pulses and normal sensory exam.  Matricectomy sites are healing well  Assessment:   No diagnosis found.   Plan:  Patient was evaluated and treated and all questions answered.    Ingrown Nail, bilaterally -Doing well can leave OTA and discontinue soaks and ointment at this point    No follow-ups on file.

## 2020-12-12 ENCOUNTER — Encounter: Payer: Self-pay | Admitting: Registered"

## 2020-12-12 ENCOUNTER — Other Ambulatory Visit: Payer: Self-pay

## 2020-12-12 ENCOUNTER — Encounter: Payer: Medicare Other | Attending: Internal Medicine | Admitting: Registered"

## 2020-12-12 DIAGNOSIS — Z713 Dietary counseling and surveillance: Secondary | ICD-10-CM | POA: Insufficient documentation

## 2020-12-12 DIAGNOSIS — F5000 Anorexia nervosa, unspecified: Secondary | ICD-10-CM | POA: Insufficient documentation

## 2020-12-12 DIAGNOSIS — R634 Abnormal weight loss: Secondary | ICD-10-CM | POA: Insufficient documentation

## 2020-12-12 NOTE — Patient Instructions (Signed)
-   Breakfast protein options: almonds, peanut butter, [place] bacon, salmon, shrimp, greek yogurt.   - Aim to increase water intake to 1 bottle a day.

## 2020-12-12 NOTE — Progress Notes (Signed)
Appointment start time: 4:09  Appointment end time: 4:40  Patient was seen on 12/12/2020 for nutrition counseling pertaining to disordered eating  Primary care provider: Oliver Barre, MD Therapist: none; has some numbers to call   ROI: N/A Any other medical team members: Chriss Driver, MD (psychiatrist)   Assessment  Pt states she is still in the process of getting connected with therapist, Gertie Fey. Reports she had an appointment with GI doctor on Mon, 4/11. States she does not need to have colonoscopy due to prior results being normal. Was prescribed new medication; sharp stomach pains have decreased. States she will be having surgery related to carpal tunnel on right wrist.   State she hasn't been drinking water. States she takes the water bottle out or housemate will take it out for her but she does not drink it. States she is not a big drinker.   States she has been staying away from spicy foods but still consuming carbonation and caffeine daily. States she recognizes she needs to increase water intake.   Previous appt: Reports she saw rheumatologist, all tests negative. Has an upcoming nerve test on 4/18.  States she has been having a lot of stomach pain. Reports keeping an eye on pancreatic duct and having tests run. Reports it may be IBS. States recent bone scan reveals presence of osteopenia. Shows left hip is more significant than right hip; also present in spine.  Visit with me was 08/2018 and pt was weighing ~104.5 lbs.   Reports she likes to snack at night. States she loves [place] although they are hot. Does not drink water, mostly drinks soda and coffee.    Growth Metrics: Goal rate of weight gain: 0.5-1.0 lb/week  Eating history: Length of time: age 45 - early 61's. Previous treatments:  Goals for RD meetings:   Weight history:  Recent weight: 101.7  Weight changes: -2.8 lbs from from 104.5 lbs previous visit 5 weeks ago (11/09/20) Highest weight: 115 (6 years  ago)    Lowest weight: 80  Most consistent weight: 105   How has weight changed in the past year: recently has dropped  Medical Information:  Changes in hair, skin, nails since ED started: no Chewing/swallowing difficulties: no Reflux or heartburn: yes, taking protonix Trouble with teeth: no LMP without the use of hormones: 3/3  Weight at that point: unsure Effect of exercise on menses: N/A   Effect of hormones on menses: monthly cycle Constipation, diarrhea: yes, constipation has BM once a day Dizziness/lightheadedness: no Headaches/body aches: not often, gets monthly shots for management Heart racing/chest pain: heart racing, increased HR due to anxiety, normal for her Mood: some depression present  Sleep: sleeps 8-8.5 hrs/night Focus/concentration: no Cold intolerance: no Vision changes: yes, had eye exam 3/28 awaiting new glasses  Mental health diagnosis: previous history of AN   Dietary assessment: A typical day consists of 3 meals and 1-2 snacks  Safe foods include: all; not a picky eater Avoided foods include: none  24 hour recall:  B (10:30 am): spinach wrap (eggs, shredded cheese, bacon) + 3 Tbs cottage cheese + orange + grapes  S: vinegar chips  L (3:30 pm): ham & cheese sandwich with balsamic dressing + carrot sticks with ranch + french fries/handful chips + 1/2 can soda S: [employer]aki[employer] + cinnamon bun D (7 pm): spaghetti and meatballs + fruit plate + french fries  S: pretzels S (11 pm):   Beverages: coffee, soda (2*12 oz; 24 oz), water  What Methods Do  You Use To Control Your Weight (Compensatory behaviors)?  none  Estimated energy intake: 2100-2200 kcal  Estimated energy needs: 2000-2400 kcal 250-300 g CHO 100-120 g pro 67-80 g fat  Nutrition Diagnosis: NB-1.5 Disordered eating pattern As related to history of anorexia nervosa.  As evidenced by pt reported.  Intervention/Goals: Pt was encouraged with changes made from previous appt. Discussed role of  increasing water intake and ways to do this. Pt agreed with goals listed.  Goals: - Breakfast protein options: almonds, peanut butter, [place] bacon, salmon, shrimp, greek yogurt.  - Aim to increase water intake to 1 bottle a day.   Meal plan:    3 meals    2 snacks   Monitoring and Evaluation: Patient will follow up in 3 weeks.

## 2020-12-15 ENCOUNTER — Ambulatory Visit (INDEPENDENT_AMBULATORY_CARE_PROVIDER_SITE_OTHER): Payer: Medicare Other | Admitting: Obstetrics & Gynecology

## 2020-12-15 ENCOUNTER — Encounter: Payer: Self-pay | Admitting: Obstetrics & Gynecology

## 2020-12-15 ENCOUNTER — Other Ambulatory Visit: Payer: Self-pay

## 2020-12-15 VITALS — BP 116/70

## 2020-12-15 DIAGNOSIS — Z30015 Encounter for initial prescription of vaginal ring hormonal contraceptive: Secondary | ICD-10-CM | POA: Diagnosis not present

## 2020-12-15 DIAGNOSIS — M8589 Other specified disorders of bone density and structure, multiple sites: Secondary | ICD-10-CM

## 2020-12-15 DIAGNOSIS — N943 Premenstrual tension syndrome: Secondary | ICD-10-CM | POA: Diagnosis not present

## 2020-12-15 MED ORDER — ETONOGESTREL-ETHINYL ESTRADIOL 0.12-0.015 MG/24HR VA RING: 1.0000 | VAGINAL_RING | VAGINAL | 4 refills | Status: DC

## 2020-12-15 NOTE — Progress Notes (Signed)
    Karen Hahn [birth year] [id number]        45 y.o.  G2P0020   RP: Counseling on contraception to control PMS  HPI: Would like to start on DepoProvera.  Started on the generic of LoEstrin 1/20 in 10/2020 for PMS control.  Has migraines.  Abstinent currently.  Has small red dots on the abdomen.   OB History  Gravida Para Term Preterm AB Living  2       2    SAB IAB Ectopic Multiple Live Births  2            # Outcome Date GA Lbr Len/2nd Weight Sex Delivery Anes PTL Lv  2 SAB           1 SAB             Past medical history,surgical history, problem list, medications, allergies, family history and social history were all reviewed and documented in the EPIC chart.   Directed ROS with pertinent positives and negatives documented in the history of present illness/assessment and plan.  Exam:  Vitals:   12/15/20 1133  BP: 116/70   General appearance:  Normal  Abdomen: Few very small hemangiomas wnl.  Gynecologic exam: Deferred   Assessment/Plan:  45 y.o. G2P0020   1. Encounter for initial prescription of vaginal ring hormonal contraceptive Different hormonal options discussed to control PMS.  Tends to have withdrawal migraines.  H/O Osteopenia.  After discussing the benefits and risks of different contraceptives, decision to start on Nuvaring continuously.  Usage reviewed and prescription sent to pharmacy.  2. PMS (premenstrual syndrome) As above, will attempt Nuvaring to control.  3. Osteopenia of multiple sites Vit D supplements, Ca++ 1.5 g/d and regular weight bearing physical activities.  Recommended against DepoProvera.  Other orders - etonogestrel-ethinyl estradiol (NUVARING) 0.12-0.015 MG/24HR vaginal ring; Place 1 each vaginally every 28 (twenty-eight) days. Insert vaginally and leave in place for 4 consecutive weeks, then switch ring.  Continuous use.  Genia Del MD, 12:15 PM 12/15/2020

## 2020-12-19 ENCOUNTER — Other Ambulatory Visit: Payer: Self-pay

## 2020-12-19 MED ORDER — NORETHIN ACE-ETH ESTRAD-FE 1-20 MG-MCG(24) PO TABS: 1.0000 | ORAL_TABLET | Freq: Every day | ORAL | 11 refills | Status: DC

## 2020-12-22 ENCOUNTER — Encounter: Payer: Self-pay | Admitting: Obstetrics & Gynecology

## 2020-12-22 DIAGNOSIS — G5601 Carpal tunnel syndrome, right upper limb: Secondary | ICD-10-CM | POA: Diagnosis not present

## 2021-01-02 ENCOUNTER — Telehealth: Payer: Self-pay | Admitting: *Deleted

## 2021-01-02 MED ORDER — IBANDRONATE SODIUM 150 MG PO TABS: 150.0000 mg | ORAL_TABLET | ORAL | 9 refills | Status: DC
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
## 2021-01-07 ENCOUNTER — Other Ambulatory Visit: Payer: Self-pay | Admitting: Neurology

## 2021-01-08 ENCOUNTER — Other Ambulatory Visit: Payer: Self-pay

## 2021-01-08 ENCOUNTER — Telehealth: Payer: Self-pay | Admitting: Neurology

## 2021-01-08 DIAGNOSIS — R202 Paresthesia of skin: Secondary | ICD-10-CM

## 2021-01-08 NOTE — Telephone Encounter (Signed)
Patient is going to contact pharmacy regarding this.

## 2021-01-08 NOTE — Telephone Encounter (Signed)
Patient called to check on the refill on Aimovig 140 (? Dosage).  [facility] on [address]

## 2021-01-09 ENCOUNTER — Other Ambulatory Visit: Payer: Self-pay

## 2021-01-09 ENCOUNTER — Ambulatory Visit (INDEPENDENT_AMBULATORY_CARE_PROVIDER_SITE_OTHER): Payer: Medicare Other | Admitting: Neurology

## 2021-01-09 DIAGNOSIS — G5621 Lesion of ulnar nerve, right upper limb: Secondary | ICD-10-CM

## 2021-01-09 DIAGNOSIS — R202 Paresthesia of skin: Secondary | ICD-10-CM | POA: Diagnosis not present

## 2021-01-09 NOTE — Procedures (Signed)
[facility]  [address] [place], [address]  [place], [place] [address] Tel: [phone] Fax:  [phone] Test Date:  01/09/2021  Patient: Karen Hahn DOB: 07/13/1976 Physician: Nita Sickle, DO  Sex: Female Height: 5\' 2"  Ref Phys: , MD  ID#: [id number] Salter   Technician:    Patient Complaints: This is a 45 year old female referred for evaluation of right hand numbness and tingling.  NCV & EMG Findings: Extensive electrodiagnostic testing of the right upper extremity shows:  1. Right median, ulnar, and mixed palmar sensory responses are within normal limits.  2. Right median motor responses within normal limits.  Right ulnar motor response shows slowed conduction velocity across the elbow (A Elbow-B Elbow, 43 m/s).   3. There is no evidence of active or chronic motor axonal loss changes affecting any of the tested muscles.  Motor unit configuration and recruitment pattern is within normal limits.    Impression: 1. Right ulnar neuropathy with slowing across the elbow, purely demyelinating, mild. 2. There is no evidence of carpal tunnel syndrome or cervical radiculopathy affecting the right upper extremity.   ___________________________ 47, DO    Nerve Conduction Studies Anti Sensory Summary Table   Stim Site NR Peak (ms) Norm Peak (ms) P-T Amp (V) Norm P-T Amp  Right Median Anti Sensory (2nd Digit)  33C  Wrist    2.9 <3.4 52.5 >20  Right Ulnar Anti Sensory (5th Digit)  33C  Wrist    2.4 <3.1 38.0 >12   Motor Summary Table   Stim Site NR Onset (ms) Norm Onset (ms) O-P Amp (mV) Norm O-P Amp Site1 Site2 Delta-0 (ms) Dist (cm) Vel (m/s) Norm Vel (m/s)  Right Median Motor (Abd Poll Brev)  33C  Wrist    2.7 <3.9 13.4 >6 Elbow Wrist 4.4 26.0 59 >50  Elbow    7.1  13.1         Right Ulnar Motor (Abd Dig Minimi)  33C  Wrist    1.8 <3.1 13.4 >7 B Elbow Wrist 2.6 18.0 69 >50  B Elbow    4.4  12.4  A Elbow B Elbow 2.3 10.0 43 >50  A Elbow    6.7   11.4          Comparison Summary Table   Stim Site NR Peak (ms) Norm Peak (ms) P-T Amp (V) Site1 Site2 Delta-P (ms) Norm Delta (ms)  Right Median/Ulnar Palm Comparison (Wrist - 8cm)  33C  Median Palm    1.8 <2.2 97.1 Median Palm Ulnar Palm 0.2   Ulnar Palm    1.6 <2.2 58.6       EMG   Side Muscle Ins Act Fibs Psw Fasc Number Recrt Dur Dur. Amp Amp. Poly Poly. Comment  Right 1stDorInt Nml Nml Nml Nml Nml Nml Nml Nml Nml Nml Nml Nml N/A  Right PronatorTeres Nml Nml Nml Nml Nml Nml Nml Nml Nml Nml Nml Nml N/A  Right ABD Dig Min Nml Nml Nml Nml Nml Nml Nml Nml Nml Nml Nml Nml N/A  Right FlexCarpiUln Nml Nml Nml Nml Nml Nml Nml Nml Nml Nml Nml Nml N/A  Right Biceps Nml Nml Nml Nml Nml Nml Nml Nml Nml Nml Nml Nml N/A  Right Triceps Nml Nml Nml Nml Nml Nml Nml Nml Nml Nml Nml Nml N/A  Right Deltoid Nml Nml Nml Nml Nml Nml Nml Nml Nml Nml Nml Nml N/A      Waveforms:

## 2021-01-10 ENCOUNTER — Other Ambulatory Visit: Payer: Self-pay

## 2021-01-10 DIAGNOSIS — R202 Paresthesia of skin: Secondary | ICD-10-CM

## 2021-01-11 ENCOUNTER — Telehealth (INDEPENDENT_AMBULATORY_CARE_PROVIDER_SITE_OTHER): Payer: Medicare Other | Admitting: Psychiatry

## 2021-01-11 ENCOUNTER — Encounter (HOSPITAL_COMMUNITY): Payer: Self-pay | Admitting: Psychiatry

## 2021-01-11 ENCOUNTER — Other Ambulatory Visit: Payer: Self-pay

## 2021-01-11 DIAGNOSIS — F411 Generalized anxiety disorder: Secondary | ICD-10-CM | POA: Diagnosis not present

## 2021-01-11 DIAGNOSIS — F900 Attention-deficit hyperactivity disorder, predominantly inattentive type: Secondary | ICD-10-CM

## 2021-01-11 DIAGNOSIS — F639 Impulse disorder, unspecified: Secondary | ICD-10-CM | POA: Diagnosis not present

## 2021-01-11 DIAGNOSIS — F5000 Anorexia nervosa, unspecified: Secondary | ICD-10-CM | POA: Diagnosis not present

## 2021-01-11 DIAGNOSIS — F3181 Bipolar II disorder: Secondary | ICD-10-CM

## 2021-01-11 DIAGNOSIS — G4700 Insomnia, unspecified: Secondary | ICD-10-CM

## 2021-01-11 MED ORDER — PAROXETINE HCL 20 MG PO TABS: 20.0000 mg | ORAL_TABLET | Freq: Every day | ORAL | 1 refills | Status: DC

## 2021-01-11 MED ORDER — LAMOTRIGINE 150 MG PO TABS: ORAL_TABLET | ORAL | 1 refills | Status: DC

## 2021-01-11 NOTE — Progress Notes (Signed)
Virtual Visit via Telephone Note  I connected with Karen Hahn on 01/11/21 at  3:00 PM EDT by telephone and verified that I am speaking with the correct person using two identifiers.  Location: Patient: home Provider: office   I discussed the limitations, risks, security and privacy concerns of performing an evaluation and management service by telephone and the availability of in person appointments. I also discussed with the patient that there may be a patient responsible charge related to this service. The patient expressed understanding and agreed to proceed.   History of Present Illness: Karen Hahn shares that she is doing well. She is watching her friend's son until the afternoon. Her depression is stable. Her anxiety is unchanged. It is manageable. Her sleep is good. She denies isolation, anhedonia and is active. Her irritability is on/off but no where near as bad as it was. Pt denies recent manic and hypomanic symptoms including periods of decreased need for sleep, increased energy, mood lability, impulsivity, FOI, and excessive spending. She is eating 3 meals a day and snacking at night. She has not been checking her weight and does not think she has lost any weight. She denies SI/HI. Karen Hahn has no concerns at this time. Karen Hahn was started on Paxil by her PCP for PMDD. She becomes very irritable around her period time.     Observations/Objective:  General Appearance: unable to assess  Eye Contact:  unable to assess  Speech:  Clear and Coherent and Normal Rate  Volume:  Normal  Mood:  Euthymic  Affect:  Full Range  Thought Process:  Goal Directed, Linear, and Descriptions of Associations: Intact  Orientation:  Full (Time, Place, and Person)  Thought Content:  Logical  Suicidal Thoughts:  No  Homicidal Thoughts:  No  Memory:  Immediate;   Good  Judgement:  Good  Insight:  Good  Psychomotor Activity: unable to assess  Concentration:  Concentration: Good  Recall:  Good  Fund of  Knowledge:  Good  Language:  Good  Akathisia:  unable to assess  Handed:  Right  AIMS (if indicated):     Assets:  Communication Skills Desire for Improvement Financial Resources/Insurance Housing Leisure Time Physical Health Resilience Social Support Talents/Skills Transportation Vocational/Educational  ADL's:  unable to assess  Cognition:  WNL  Sleep:        Assessment and Plan:  Depression screen [facility] 2/9 01/11/2021 09/27/2020 09/20/2020 02/21/2020 03/01/2019  Decreased Interest 0 0 0 0 0  Down, Depressed, Hopeless 1 0 0 0 0  PHQ - 2 Score 1 0 0 0 0  Altered sleeping - 0 - - 0  Tired, decreased energy - 0 - - 0  Change in appetite - 0 - - 0  Feeling bad or failure about yourself  - 0 - - 0  Trouble concentrating - 0 - - 0  Moving slowly or fidgety/restless - 0 - - 0  Suicidal thoughts - 0 - - 0  PHQ-9 Score - 0 - - 0  Some recent data might be hidden    Flowsheet Row Video Visit from 01/11/2021 in [facility]  C-SSRS RISK CATEGORY No Risk       1. Impulse control disorder - lamoTRIgine (LAMICTAL) 150 MG tablet; TAKE 2 TABLETS(300 MG) BY MOUTH DAILY  Dispense: 180 tablet; Refill: 1  2. Bipolar II disorder (HCC) - lamoTRIgine (LAMICTAL) 150 MG tablet; TAKE 2 TABLETS(300 MG) BY MOUTH DAILY  Dispense: 180 tablet; Refill: 1 - increase PARoxetine (PAXIL)  20 MG tablet; Take 1 tablet (20 mg total) by mouth daily.  Dispense: 90 tablet; Refill: 1   GAD (generalized anxiety disorder)  Impulse control disorder - Plan: lamoTRIgine (LAMICTAL) 150 MG tablet  Bipolar II disorder (HCC) - Plan: lamoTRIgine (LAMICTAL) 150 MG tablet, PARoxetine (PAXIL) 20 MG tablet  Anorexia nervosa  Insomnia, unspecified type  Attention deficit hyperactivity disorder (ADHD), predominantly inattentive type   Follow Up Instructions: In 5-6 month or sooner if needed   I discussed the assessment and treatment plan with the patient. The patient was  provided an opportunity to ask questions and all were answered. The patient agreed with the plan and demonstrated an understanding of the instructions.   The patient was advised to call back or seek an in-person evaluation if the symptoms worsen or if the condition fails to improve as anticipated.  I provided 12 minutes of non-face-to-face time during this encounter.   Oletta Darter, MD

## 2021-01-12 ENCOUNTER — Other Ambulatory Visit: Payer: Self-pay

## 2021-01-12 MED ORDER — AIMOVIG 140 MG/ML ~~LOC~~ SOAJ: 140.0000 mg | SUBCUTANEOUS | 3 refills | Status: DC

## 2021-01-12 NOTE — Progress Notes (Signed)
Pt request refill.

## 2021-01-16 ENCOUNTER — Ambulatory Visit: Payer: Medicare Other | Admitting: Registered"

## 2021-01-22 DIAGNOSIS — G5602 Carpal tunnel syndrome, left upper limb: Secondary | ICD-10-CM | POA: Diagnosis not present

## 2021-01-29 DIAGNOSIS — G5602 Carpal tunnel syndrome, left upper limb: Secondary | ICD-10-CM | POA: Diagnosis not present

## 2021-02-01 ENCOUNTER — Other Ambulatory Visit: Payer: Self-pay | Admitting: Neurology

## 2021-02-01 DIAGNOSIS — G5621 Lesion of ulnar nerve, right upper limb: Secondary | ICD-10-CM | POA: Diagnosis not present

## 2021-02-01 DIAGNOSIS — G5603 Carpal tunnel syndrome, bilateral upper limbs: Secondary | ICD-10-CM | POA: Diagnosis not present

## 2021-02-07 ENCOUNTER — Telehealth (HOSPITAL_COMMUNITY): Payer: Self-pay

## 2021-02-07 NOTE — Telephone Encounter (Signed)
Patient called requesting a letter to be excused from jury duty in August. Is it ok to do the letter? Please review and advise. Thank you

## 2021-02-08 ENCOUNTER — Encounter: Payer: Self-pay | Admitting: Internal Medicine

## 2021-02-08 ENCOUNTER — Ambulatory Visit (INDEPENDENT_AMBULATORY_CARE_PROVIDER_SITE_OTHER): Payer: Medicare Other | Admitting: Internal Medicine

## 2021-02-08 ENCOUNTER — Other Ambulatory Visit: Payer: Self-pay

## 2021-02-08 ENCOUNTER — Encounter (HOSPITAL_COMMUNITY): Payer: Self-pay

## 2021-02-08 DIAGNOSIS — L03031 Cellulitis of right toe: Secondary | ICD-10-CM | POA: Diagnosis not present

## 2021-02-08 DIAGNOSIS — E559 Vitamin D deficiency, unspecified: Secondary | ICD-10-CM

## 2021-02-08 DIAGNOSIS — F411 Generalized anxiety disorder: Secondary | ICD-10-CM | POA: Diagnosis not present

## 2021-02-08 MED ORDER — DOXYCYCLINE HYCLATE 100 MG PO TABS: 100.0000 mg | ORAL_TABLET | Freq: Two times a day (BID) | ORAL | 0 refills | Status: DC

## 2021-02-08 NOTE — Assessment & Plan Note (Signed)
Last vitamin D Lab Results  Component Value Date   VD25OH 21.67 (L) 10/26/2020   Low, to start oral replacement

## 2021-02-08 NOTE — Progress Notes (Signed)
Patient ID: Karen Hahn, female   DOB: [birth year], 45 y.o.   MRN: [id number]        Chief Complaint: follow up note for jury duty, upcoming neurosurgury, and facial rash       HPI:  Karen Hahn is a 45 y.o. female here with c/o presistent anxiety and unable to function as jury member, asks for jury duty notice.  Also with recent dx bilateral CTS and right ulnar neuritis by EMG/NCS due for RUE surgury soon.  Also admits to picking at face with fingernails as an anxiety relaese for many years since a teen and doing this more recently now with multiple sores to the face but no red, tender swelling.  Does incidentally have right great toe lateral nail aspect red, tender, swelling x 3 days with mild swelling, but no fever, chills, red streaks, ulcer or drainage.  Pt denies chest pain, increased sob or doe, wheezing, orthopnea, PND, increased LE swelling, palpitations, dizziness or syncope.   Pt denies polydipsia, polyuria, or new focal neuro s/s.  Not taking vit d       Wt Readings from Last 3 Encounters:  02/08/21 107 lb (48.5 kg)  11/13/20 101 lb 6.4 oz (46 kg)  10/26/20 101 lb (45.8 kg)   BP Readings from Last 3 Encounters:  02/08/21 102/60  12/15/20 116/70  11/13/20 102/62         Past Medical History:  Diagnosis Date   ADD (attention deficit disorder)    Allergy    Anorexia nervosa without bulimia    Chronic constipation    Drug abuse (HCC)    GAD (generalized anxiety disorder)    GERD (gastroesophageal reflux disease)    History of gastric restrictive surgery    History of gastric ulcer    History of Helicobacter pylori infection    after 2003   HLD (hyperlipidemia) 02/16/2018   Impulse control disorder    Moderate major depression (HCC)    Osteopenia    Osteoporosis    Post concussion syndrome    fell 10/20/2015   SMAS (superior mesenteric artery syndrome) (HCC)    Ulnar neuropathy    Urge urinary incontinence    Past Surgical History:  Procedure Laterality Date    DILATION AND CURETTAGE OF UTERUS  2005   INTERSTIM IMPLANT PLACEMENT N/A 10/18/2015   Procedure: [name] IMPLANT FIRST STAGE;  Surgeon: Jerilee Field, MD;  Location: [facility];  Service: Urology;  Laterality: N/A;   INTERSTIM IMPLANT PLACEMENT N/A 10/18/2015   Procedure: [name] IMPLANT SECOND STAGE;  Surgeon: Jerilee Field, MD;  Location: [facility];  Service: Urology;  Laterality: N/A;   LAPAROSCOPIC GASTRIC RESTRICTIVE DUODENAL PROCEDURE (DUODENAL SWITCH)  12/ 2003   ORIF RIGHT WRIST FX  2013   REMOVAL HARDWARE X2 in  2013--  No retained hardware    reports that she quit smoking about 4 years ago. Her smoking use included cigarettes. She has a 0.50 pack-year smoking history. She has never used smokeless tobacco. She reports that she does not drink alcohol and does not use drugs. family history includes ADD / ADHD in her brother; Alcohol abuse in her maternal uncle and mother; Anxiety disorder in her father, maternal grandmother, and mother; Breast cancer (age of onset: 33) in her paternal grandmother; COPD in her father and mother; Cancer in her mother; Depression in her maternal grandmother and mother; Drug abuse in her father; Stroke in her mother. Allergies  Allergen Reactions   Bactrim [  Sulfamethoxazole-Trimethoprim] Other (See Comments)    Yeast infection   Penicillins Other (See Comments)    Has patient had a PCN reaction causing immediate rash, facial/tongue/throat swelling, SOB or lightheadedness with hypotension: NO (UTI, YEAST INFECTION) Has patient had a PCN reaction causing severe rash involving mucus membranes or skin necrosis: NO Has patient had a PCN reaction that required hospitalization NO Has patient had a PCN reaction occurring within the last 10 years: NO If all of the above answers are "NO", then may proceed with Cephalosporin use.    Ciprofloxacin Itching   Morphine And Related Itching and Other (See Comments)    "whole body  feels tight feeling"   Reglan [Metoclopramide]    Benadryl [Diphenhydramine Hcl] Palpitations   Diflucan [Fluconazole] Rash   Current Outpatient Medications on File Prior to Visit  Medication Sig Dispense Refill   Calcium-Iron-Vit D-Vit K 500-08-998-40 MG-UNT-MCG CHEW Chew 1 each by mouth daily.     Erenumab-aooe (AIMOVIG) 140 MG/ML SOAJ Inject 140 mg into the skin every 30 (thirty) days. 1 mL 3   ferrous gluconate (FERGON) 324 MG tablet Take 1 tablet (324 mg total) by mouth daily with breakfast. 90 tablet 3   ibandronate (BONIVA) 150 MG tablet Take 1 tablet (150 mg total) by mouth every 30 (thirty) days. Take in the morning with a full glass of water, on an empty stomach, and do not take anything else by mouth or lie down for the next 30 min. 3 tablet 9   ibuprofen (ADVIL) 600 MG tablet TAKE 1 TABLET(600 MG) BY MOUTH EVERY 6 HOURS AS NEEDED 60 tablet 0   lamoTRIgine (LAMICTAL) 150 MG tablet TAKE 2 TABLETS(300 MG) BY MOUTH DAILY 180 tablet 1   lubiprostone (AMITIZA) 8 MCG capsule Take 1 capsule (8 mcg total) by mouth 2 (two) times daily with a meal. 60 capsule 3   naproxen (NAPROSYN) 500 MG tablet Take 1 tablet (500 mg total) by mouth 2 (two) times daily as needed. 20 tablet 3   Norethindrone Acetate-Ethinyl Estrad-FE (LOESTRIN 24 FE) 1-20 MG-MCG(24) tablet Take 1 tablet by mouth daily. 28 tablet 11   omeprazole (PRILOSEC) 40 MG capsule Take 1 capsule (40 mg total) by mouth daily. 90 capsule 3   ondansetron (ZOFRAN) 4 MG tablet Take 1 tablet (4 mg total) by mouth every 8 (eight) hours as needed for nausea or vomiting. 30 tablet 2   PARoxetine (PAXIL) 20 MG tablet Take 1 tablet (20 mg total) by mouth daily. 90 tablet 1   Pediatric Multivitamins-Iron (FLINTSTONES COMPLETE PO) Take 2 tablets by mouth daily.     promethazine (PHENERGAN) 25 MG tablet TAKE 1 TABLET(25 MG) BY MOUTH TWICE DAILY AS NEEDED (Patient taking differently: Take 25 mg by mouth daily.) 60 tablet 2   rizatriptan (MAXALT-MLT) 10  MG disintegrating tablet TAKE 1 TABLET BY MOUTH AS NEEDED FOR MIGRANE. MAY REPEAT IN 2 HOURS IF NEEDED 10 tablet 2   rosuvastatin (CRESTOR) 20 MG tablet TAKE 1 TABLET(20 MG) BY MOUTH DAILY 90 tablet 3   UBRELVY 100 MG TABS TAKE 1 TABLET BY MOUTH AS NEEDED. MAY REPEAT DOSE AFTER 2 HOURS. MAX 2 TABLETS IN 24 HOURS 30 tablet 0   No current facility-administered medications on file prior to visit.        ROS:  All others reviewed and negative.  Objective        PE:  BP 102/60 (BP Location: Left Arm, Patient Position: Sitting, Cuff Size: Normal)   Pulse 93  Ht 5' 2.75" (1.594 m)   Wt 107 lb (48.5 kg)   SpO2 98%   BMI 19.11 kg/m                 Constitutional: Pt appears in NAD               HENT: Head: NCAT.                Right Ear: External ear normal.                 Left Ear: External ear normal.                Eyes: . Pupils are equal, round, and reactive to light. Conjunctivae and EOM are normal               Nose: without d/c or deformity               Neck: Neck supple. Gross normal ROM               Cardiovascular: Normal rate and regular rhythm.                 Pulmonary/Chest: Effort normal and breath sounds without rales or wheezing.                Abd:  Soft, NT, ND, + BS, no organomegaly               Neurological: Pt is alert. At baseline orientation, motor grossly intact               Skin: Skin is warm. No rashes, no other new lesions, LE edema - right great toenail lateal aspect with local mild red, tender, swelling without abscess or drainage or nail loosening               Psychiatric: Pt behavior is normal without agitation   Micro: none  Cardiac tracings I have personally interpreted today:  none  Pertinent Radiological findings (summarize): none   Lab Results  Component Value Date   WBC 7.4 09/18/2020   HGB 12.0 09/18/2020   HCT 35.3 (L) 09/18/2020   PLT 259.0 09/18/2020   GLUCOSE 96 09/18/2020   CHOL 180 02/21/2020   TRIG 93.0 02/21/2020   HDL 56.20  02/21/2020   LDLCALC 105 (H) 02/21/2020   ALT 29 09/18/2020   AST 25 09/18/2020   NA 137 09/18/2020   K 4.6 09/18/2020   CL 103 09/18/2020   CREATININE 0.63 09/18/2020   BUN 13 09/18/2020   CO2 29 09/18/2020   TSH 1.76 01/07/2020   HGBA1C 5.1 02/18/2019   Assessment/Plan:  Karen Hahn is a 45 y.o. White or Caucasian [1] female with  has a past medical history of ADD (attention deficit disorder), Allergy, Anorexia nervosa without bulimia, Chronic constipation, Drug abuse (HCC), GAD (generalized anxiety disorder), GERD (gastroesophageal reflux disease), History of gastric restrictive surgery, History of gastric ulcer, History of Helicobacter pylori infection, HLD (hyperlipidemia) (02/16/2018), Impulse control disorder, Moderate major depression (HCC), Osteopenia, Osteoporosis, Post concussion syndrome, SMAS (superior mesenteric artery syndrome) (HCC), Ulnar neuropathy, and Urge urinary incontinence.  Cellulitis of great toe, right Mild to mod, for antibx course,  to f/u any worsening symptoms or concerns  GAD (generalized anxiety disorder) Chronic persistent, declines change in tx for now, advised to stop picking at face, ok for jury duty letter  Vitamin D deficiency Last vitamin D Lab Results  Component Value Date  VD25OH 21.67 (L) 10/26/2020   Low, to start oral replacement  Followup: Return if symptoms worsen or fail to improve.  Oliver Barre, MD 02/08/2021 10:33 PM [facility]

## 2021-02-08 NOTE — Assessment & Plan Note (Addendum)
Chronic persistent, declines change in tx for now, advised to stop picking at face, ok for jury duty letter

## 2021-02-08 NOTE — Assessment & Plan Note (Signed)
Mild to mod, for antibx course,  to f/u any worsening symptoms or concerns 

## 2021-02-08 NOTE — Patient Instructions (Signed)
Please take all new medication as prescribed - the antibiotic  You are given the jury duty letter today  Please continue all other medications as before, and refills have been done if requested.  Please have the pharmacy call with any other refills you may need.  Please keep your appointments with your specialists as you may have planned

## 2021-02-12 DIAGNOSIS — G5620 Lesion of ulnar nerve, unspecified upper limb: Secondary | ICD-10-CM | POA: Insufficient documentation

## 2021-02-12 DIAGNOSIS — G5603 Carpal tunnel syndrome, bilateral upper limbs: Secondary | ICD-10-CM | POA: Diagnosis not present

## 2021-02-12 DIAGNOSIS — G5602 Carpal tunnel syndrome, left upper limb: Secondary | ICD-10-CM | POA: Diagnosis not present

## 2021-02-12 DIAGNOSIS — G5601 Carpal tunnel syndrome, right upper limb: Secondary | ICD-10-CM | POA: Diagnosis not present

## 2021-02-12 DIAGNOSIS — G8918 Other acute postprocedural pain: Secondary | ICD-10-CM | POA: Diagnosis not present

## 2021-02-12 DIAGNOSIS — G5621 Lesion of ulnar nerve, right upper limb: Secondary | ICD-10-CM | POA: Diagnosis not present

## 2021-02-14 NOTE — Telephone Encounter (Signed)
Please advise.  Patient scheduled for an EMG on 02/20/21.

## 2021-02-16 ENCOUNTER — Other Ambulatory Visit: Payer: Self-pay

## 2021-02-16 DIAGNOSIS — K8689 Other specified diseases of pancreas: Secondary | ICD-10-CM

## 2021-02-17 DIAGNOSIS — G5603 Carpal tunnel syndrome, bilateral upper limbs: Secondary | ICD-10-CM | POA: Diagnosis not present

## 2021-02-17 DIAGNOSIS — G5621 Lesion of ulnar nerve, right upper limb: Secondary | ICD-10-CM | POA: Diagnosis not present

## 2021-02-20 ENCOUNTER — Ambulatory Visit (INDEPENDENT_AMBULATORY_CARE_PROVIDER_SITE_OTHER): Payer: Medicare Other | Admitting: Neurology

## 2021-02-20 ENCOUNTER — Other Ambulatory Visit: Payer: Self-pay

## 2021-02-20 DIAGNOSIS — G5602 Carpal tunnel syndrome, left upper limb: Secondary | ICD-10-CM

## 2021-02-20 DIAGNOSIS — R202 Paresthesia of skin: Secondary | ICD-10-CM | POA: Diagnosis not present

## 2021-02-20 NOTE — Procedures (Signed)
[facility]  [address] [place], [address]  [place], [place] [address] Tel: [phone] Fax:  [phone] Test Date:  02/20/2021  Patient: Karen Hahn DOB: 08/23/1975 Physician: Nita Sickle, DO  Sex: Female Height: 5\' 2"  Ref Phys: , D.O.  ID#: Shon Millet   Technician:    Patient Complaints: This is a 45 year old female referred for evaluation of left hand paresthesias.  NCV & EMG Findings: Extensive electrodiagnostic testing of the left upper extremity shows:  Left median sensory response shows prolonged latency (3.7 ms).  Left ulnar sensory responses within normal limits.   Left median and ulnar motor responses within normal limits.   There is no evidence of active or chronic motor axonal loss changes affecting any of the tested muscles.  Motor unit configuration and recruitment pattern is within normal limits.    Impression: Left median neuropathy at or distal to the wrist, consistent with a clinical diagnosis of carpal tunnel syndrome.  Overall, these findings are mild in degree electrically.   ___________________________ 47, DO    Nerve Conduction Studies Anti Sensory Summary Table   Stim Site NR Peak (ms) Norm Peak (ms) P-T Amp (V) Norm P-T Amp  Left Median Anti Sensory (2nd Digit)  33C  Wrist    3.7 <3.4 52.9 >20  Left Ulnar Anti Sensory (5th Digit)  33C  Wrist    2.8 <3.1 46.4 >12   Motor Summary Table   Stim Site NR Onset (ms) Norm Onset (ms) O-P Amp (mV) Norm O-P Amp Site1 Site2 Delta-0 (ms) Dist (cm) Vel (m/s) Norm Vel (m/s)  Left Median Motor (Abd Poll Brev)  33C  Wrist    3.0 <3.9 12.1 >6 Elbow Wrist 4.5 27.0 60 >50  Elbow    7.5  11.8         Left Ulnar Motor (Abd Dig Minimi)  33C  Wrist    2.1 <3.1 10.3 >7 B Elbow Wrist 3.0 20.0 67 >50  B Elbow    5.1  9.4  A Elbow B Elbow 1.6 10.0 62 >50  A Elbow    6.7  9.4          EMG   Side Muscle Ins Act Fibs Psw Fasc Number Recrt Dur Dur. Amp Amp. Poly Poly. Comment  Left  1stDorInt Nml Nml Nml Nml Nml Nml Nml Nml Nml Nml Nml Nml N/A  Left Abd Poll Brev Nml Nml Nml Nml Nml Nml Nml Nml Nml Nml Nml Nml N/A  Left PronatorTeres Nml Nml Nml Nml Nml Nml Nml Nml Nml Nml Nml Nml N/A  Left Biceps Nml Nml Nml Nml Nml Nml Nml Nml Nml Nml Nml Nml N/A  Left Triceps Nml Nml Nml Nml Nml Nml Nml Nml Nml Nml Nml Nml N/A  Left Deltoid Nml Nml Nml Nml Nml Nml Nml Nml Nml Nml Nml Nml N/A      Waveforms:

## 2021-02-21 ENCOUNTER — Encounter: Payer: Medicare Other | Admitting: Internal Medicine

## 2021-02-27 ENCOUNTER — Other Ambulatory Visit: Payer: Self-pay

## 2021-02-27 ENCOUNTER — Ambulatory Visit (INDEPENDENT_AMBULATORY_CARE_PROVIDER_SITE_OTHER): Payer: Medicare Other | Admitting: Podiatry

## 2021-02-27 DIAGNOSIS — M79601 Pain in right arm: Secondary | ICD-10-CM | POA: Diagnosis not present

## 2021-02-27 DIAGNOSIS — L02619 Cutaneous abscess of unspecified foot: Secondary | ICD-10-CM | POA: Diagnosis not present

## 2021-02-27 DIAGNOSIS — L03039 Cellulitis of unspecified toe: Secondary | ICD-10-CM | POA: Diagnosis not present

## 2021-02-27 DIAGNOSIS — S91209A Unspecified open wound of unspecified toe(s) with damage to nail, initial encounter: Secondary | ICD-10-CM

## 2021-02-27 MED ORDER — CEPHALEXIN 500 MG PO CAPS: 500.0000 mg | ORAL_CAPSULE | Freq: Three times a day (TID) | ORAL | 0 refills | Status: DC

## 2021-02-27 MED ORDER — LIDOCAINE 5 % EX OINT: 1.0000 "application " | TOPICAL_OINTMENT | CUTANEOUS | 0 refills | Status: DC | PRN

## 2021-02-27 NOTE — Progress Notes (Signed)
  Subjective:  Patient ID: Karen Hahn, female    DOB: May 16, 1976,  MRN: [id number]  Toenails came off and are infected  45 y.o. female returns with the above complaint. History confirmed with patient.   Objective:  Physical Exam: warm, good capillary refill, no trophic changes or ulcerative lesions, normal DP and PT pulses and normal sensory exam.  Nail plates of both hallux have avulsed with a granular wound bed and lateral fourth toes have previous hangnails  Assessment:   No diagnosis found.   Plan:  Patient was evaluated and treated and all questions answered.  Has had nontraumatic avulsion of the bilateral hallux.  I do think she has a minor infection with cellulitis localized to the toe.  Recommended antibiotic control with Keflex (she has an allergy to penicillins but this is due to yeast infections not true allergy) I sent this to her pharmacy.  Also recommended application of antibiotic ointment and lidocaine ointment for pain control daily for the next weeks then leave open to air.  Continue Epsom salt soaks which she is already doing.  Return in 2 weeks for follow-up.    Return in about 2 weeks (around 03/13/2021).

## 2021-02-27 NOTE — Patient Instructions (Signed)
Do the soaks 2-3 times daily for 2 weeks   Ointments for 1 week then leave open to air   You can use betadine if it remains oozy

## 2021-03-05 ENCOUNTER — Ambulatory Visit (INDEPENDENT_AMBULATORY_CARE_PROVIDER_SITE_OTHER): Payer: Medicare Other | Admitting: Internal Medicine

## 2021-03-05 ENCOUNTER — Encounter: Payer: Self-pay | Admitting: Internal Medicine

## 2021-03-05 ENCOUNTER — Other Ambulatory Visit: Payer: Self-pay

## 2021-03-05 VITALS — BP 100/64 | HR 102 | Temp 98.8°F | Ht 62.75 in | Wt 103.8 lb

## 2021-03-05 DIAGNOSIS — Z23 Encounter for immunization: Secondary | ICD-10-CM

## 2021-03-05 DIAGNOSIS — E7800 Pure hypercholesterolemia, unspecified: Secondary | ICD-10-CM | POA: Diagnosis not present

## 2021-03-05 DIAGNOSIS — L03031 Cellulitis of right toe: Secondary | ICD-10-CM

## 2021-03-05 DIAGNOSIS — R739 Hyperglycemia, unspecified: Secondary | ICD-10-CM

## 2021-03-05 DIAGNOSIS — E538 Deficiency of other specified B group vitamins: Secondary | ICD-10-CM

## 2021-03-05 DIAGNOSIS — G5620 Lesion of ulnar nerve, unspecified upper limb: Secondary | ICD-10-CM | POA: Insufficient documentation

## 2021-03-05 DIAGNOSIS — L03115 Cellulitis of right lower limb: Secondary | ICD-10-CM | POA: Diagnosis not present

## 2021-03-05 DIAGNOSIS — E559 Vitamin D deficiency, unspecified: Secondary | ICD-10-CM | POA: Diagnosis not present

## 2021-03-05 LAB — CBC WITH DIFFERENTIAL/PLATELET
Basophils Absolute: 0 10*3/uL (ref 0.0–0.1)
Basophils Relative: 0.8 % (ref 0.0–3.0)
Eosinophils Absolute: 0.2 10*3/uL (ref 0.0–0.7)
Eosinophils Relative: 3.9 % (ref 0.0–5.0)
HCT: 37.8 % (ref 36.0–46.0)
Hemoglobin: 12.7 g/dL (ref 12.0–15.0)
Lymphocytes Relative: 33.9 % (ref 12.0–46.0)
Lymphs Abs: 1.8 10*3/uL (ref 0.7–4.0)
MCHC: 33.5 g/dL (ref 30.0–36.0)
MCV: 87.7 fl (ref 78.0–100.0)
Monocytes Absolute: 0.4 10*3/uL (ref 0.1–1.0)
Monocytes Relative: 8.2 % (ref 3.0–12.0)
Neutro Abs: 2.9 10*3/uL (ref 1.4–7.7)
Neutrophils Relative %: 53.2 % (ref 43.0–77.0)
Platelets: 272 10*3/uL (ref 150.0–400.0)
RBC: 4.32 Mil/uL (ref 3.87–5.11)
RDW: 12.7 % (ref 11.5–15.5)
WBC: 5.4 10*3/uL (ref 4.0–10.5)

## 2021-03-05 LAB — BASIC METABOLIC PANEL
BUN: 9 mg/dL (ref 6–23)
CO2: 25 mEq/L (ref 19–32)
Calcium: 9.2 mg/dL (ref 8.4–10.5)
Chloride: 104 mEq/L (ref 96–112)
Creatinine, Ser: 0.65 mg/dL (ref 0.40–1.20)
GFR: 108.45 mL/min (ref >60.00)
Glucose, Bld: 54 mg/dL — ABNORMAL LOW (ref 70–99)
Potassium: 4.2 mEq/L (ref 3.5–5.1)
Sodium: 137 mEq/L (ref 135–145)

## 2021-03-05 LAB — URINALYSIS, ROUTINE W REFLEX MICROSCOPIC
Bilirubin Urine: NEGATIVE
Hgb urine dipstick: NEGATIVE
Ketones, ur: NEGATIVE
Leukocytes,Ua: NEGATIVE
Nitrite: NEGATIVE
Specific Gravity, Urine: 1.01 (ref 1.000–1.030)
Total Protein, Urine: NEGATIVE
Urine Glucose: NEGATIVE
Urobilinogen, UA: 0.2 (ref 0.0–1.0)
pH: 7 (ref 5.0–8.0)

## 2021-03-05 LAB — VITAMIN D 25 HYDROXY (VIT D DEFICIENCY, FRACTURES): VITD: 23.8 ng/mL — ABNORMAL LOW (ref 30.00–100.00)

## 2021-03-05 LAB — LIPID PANEL
Cholesterol: 183 mg/dL (ref 0–200)
HDL: 53.7 mg/dL (ref >39.00)
LDL Cholesterol: 111 mg/dL — ABNORMAL HIGH (ref 0–99)
NonHDL: 129.68
Total CHOL/HDL Ratio: 3
Triglycerides: 94 mg/dL (ref 0.0–149.0)
VLDL: 18.8 mg/dL (ref 0.0–40.0)

## 2021-03-05 LAB — HEPATIC FUNCTION PANEL
ALT: 30 U/L (ref 0–35)
AST: 24 U/L (ref 0–37)
Albumin: 4.2 g/dL (ref 3.5–5.2)
Alkaline Phosphatase: 65 U/L (ref 39–117)
Bilirubin, Direct: 0.1 mg/dL (ref 0.0–0.3)
Total Bilirubin: 0.4 mg/dL (ref 0.2–1.2)
Total Protein: 7.2 g/dL (ref 6.0–8.3)

## 2021-03-05 LAB — HEMOGLOBIN A1C: Hgb A1c MFr Bld: 5.2 % (ref 4.6–6.5)

## 2021-03-05 LAB — VITAMIN B12: Vitamin B-12: 599 pg/mL (ref 211–911)

## 2021-03-05 LAB — TSH: TSH: 0.59 u[IU]/mL (ref 0.35–5.50)

## 2021-03-05 MED ORDER — DOXYCYCLINE HYCLATE 100 MG PO TABS: 100.0000 mg | ORAL_TABLET | Freq: Two times a day (BID) | ORAL | 0 refills | Status: DC

## 2021-03-05 NOTE — Assessment & Plan Note (Signed)
Lab Results  Component Value Date   HGBA1C 5.1 02/18/2019   Stable, pt to continue current medical treatment  - diet

## 2021-03-05 NOTE — Patient Instructions (Addendum)
Please remember to call for your yearly GYN appt for pap.    You had the pneumovax shot today  Please take all new medication as prescribed - the antibiotic  Please continue all other medications as before, and refills have been done if requested.  Please have the pharmacy call with any other refills you may need.  Please continue your efforts at being more active, low cholesterol diet, and weight control.  Please keep your appointments with your specialists as you may have planned  Please go to the LAB at the blood drawing area for the tests to be done  You will be contacted by phone if any changes need to be made immediately.  Otherwise, you will receive a letter about your results with an explanation, but please check with MyChart first.  Please remember to sign up for MyChart if you have not done so, as this will be important to you in the future with finding out test results, communicating by private email, and scheduling acute appointments online when needed.  Please make an Appointment to return in 6 months, or sooner if needed

## 2021-03-05 NOTE — Progress Notes (Signed)
Patient ID: Karen Hahn, female   DOB: 05/12/1976, 45 y.o.   MRN: [id number]        Chief Complaint: follow up low vit d, cellulitis right thigh , hyperglycemia, hld, right toe cellulitis       HPI:  Karen Hahn is a 45 y.o. female here overall doing ok with right gret toe cellulitis healed.  Unfortuantely now ahs a lesion to right upper anterior thigh after picking but 3 days warm, tender, redness betting bigger.  Pt denies chest pain, increased sob or doe, wheezing, orthopnea, PND, increased LE swelling, palpitations, dizziness or syncope.   Pt denies polydipsia, polyuria, or new focal neuro s/s.  Other picked at skin lesion to face resolved.  Due for pneumovax,.  Right great toe infection healed.   Wt Readings from Last 3 Encounters:  03/05/21 103 lb 12.8 oz (47.1 kg)  02/08/21 107 lb (48.5 kg)  11/13/20 101 lb 6.4 oz (46 kg)   BP Readings from Last 3 Encounters:  03/05/21 100/64  02/08/21 102/60  12/15/20 116/70         Past Medical History:  Diagnosis Date   ADD (attention deficit disorder)    Allergy    Anorexia nervosa without bulimia    Chronic constipation    Drug abuse (HCC)    GAD (generalized anxiety disorder)    GERD (gastroesophageal reflux disease)    History of gastric restrictive surgery    History of gastric ulcer    History of Helicobacter pylori infection    after 2003   HLD (hyperlipidemia) 02/16/2018   Impulse control disorder    Moderate major depression (HCC)    Osteopenia    Osteoporosis    Post concussion syndrome    fell 10/20/2015   SMAS (superior mesenteric artery syndrome) (HCC)    Ulnar neuropathy    Urge urinary incontinence    Past Surgical History:  Procedure Laterality Date   DILATION AND CURETTAGE OF UTERUS  2005   INTERSTIM IMPLANT PLACEMENT N/A 10/18/2015   Procedure: [name] IMPLANT FIRST STAGE;  Surgeon: Jerilee Field, MD;  Location: [facility];  Service: Urology;  Laterality: N/A;   INTERSTIM IMPLANT  PLACEMENT N/A 10/18/2015   Procedure: [name] IMPLANT SECOND STAGE;  Surgeon: Jerilee Field, MD;  Location: [facility];  Service: Urology;  Laterality: N/A;   LAPAROSCOPIC GASTRIC RESTRICTIVE DUODENAL PROCEDURE (DUODENAL SWITCH)  12/ 2003   ORIF RIGHT WRIST FX  2013   REMOVAL HARDWARE X2 in  2013--  No retained hardware    reports that she quit smoking about 4 years ago. Her smoking use included cigarettes. She has a 0.50 pack-year smoking history. She has never used smokeless tobacco. She reports that she does not drink alcohol and does not use drugs. family history includes ADD / ADHD in her brother; Alcohol abuse in her maternal uncle and mother; Anxiety disorder in her father, maternal grandmother, and mother; Breast cancer (age of onset: 38) in her paternal grandmother; COPD in her father and mother; Cancer in her mother; Depression in her maternal grandmother and mother; Drug abuse in her father; Stroke in her mother. Allergies  Allergen Reactions   Bactrim [Sulfamethoxazole-Trimethoprim] Other (See Comments)    Yeast infection   Penicillins Other (See Comments)    Has patient had a PCN reaction causing immediate rash, facial/tongue/throat swelling, SOB or lightheadedness with hypotension: NO (UTI, YEAST INFECTION) Has patient had a PCN reaction causing severe rash involving mucus membranes or skin necrosis:  NO Has patient had a PCN reaction that required hospitalization NO Has patient had a PCN reaction occurring within the last 10 years: NO If all of the above answers are "NO", then may proceed with Cephalosporin use.    Ciprofloxacin Itching   Morphine And Related Itching and Other (See Comments)    "whole body feels tight feeling"   Reglan [Metoclopramide]    Benadryl [Diphenhydramine Hcl] Palpitations   Diflucan [Fluconazole] Rash   Current Outpatient Medications on File Prior to Visit  Medication Sig Dispense Refill   Calcium-Iron-Vit D-Vit K 500-08-998-40  MG-UNT-MCG CHEW Chew 1 each by mouth daily.     Erenumab-aooe (AIMOVIG) 140 MG/ML SOAJ Inject 140 mg into the skin every 30 (thirty) days. 1 mL 3   ferrous gluconate (FERGON) 324 MG tablet Take 1 tablet (324 mg total) by mouth daily with breakfast. 90 tablet 3   ibandronate (BONIVA) 150 MG tablet Take 1 tablet (150 mg total) by mouth every 30 (thirty) days. Take in the morning with a full glass of water, on an empty stomach, and do not take anything else by mouth or lie down for the next 30 min. 3 tablet 9   ibuprofen (ADVIL) 600 MG tablet TAKE 1 TABLET(600 MG) BY MOUTH EVERY 6 HOURS AS NEEDED 60 tablet 0   lamoTRIgine (LAMICTAL) 150 MG tablet TAKE 2 TABLETS(300 MG) BY MOUTH DAILY 180 tablet 1   lidocaine (XYLOCAINE) 5 % ointment Apply 1 application topically as needed. 35.44 g 0   lubiprostone (AMITIZA) 8 MCG capsule Take 1 capsule (8 mcg total) by mouth 2 (two) times daily with a meal. 60 capsule 3   naproxen (NAPROSYN) 500 MG tablet Take 1 tablet (500 mg total) by mouth 2 (two) times daily as needed. 20 tablet 3   Norethindrone Acetate-Ethinyl Estrad-FE (LOESTRIN 24 FE) 1-20 MG-MCG(24) tablet Take 1 tablet by mouth daily. 28 tablet 11   omeprazole (PRILOSEC) 40 MG capsule Take 1 capsule (40 mg total) by mouth daily. 90 capsule 3   ondansetron (ZOFRAN) 4 MG tablet Take 1 tablet (4 mg total) by mouth every 8 (eight) hours as needed for nausea or vomiting. 30 tablet 2   PARoxetine (PAXIL) 20 MG tablet Take 1 tablet (20 mg total) by mouth daily. 90 tablet 1   Pediatric Multivitamins-Iron (FLINTSTONES COMPLETE PO) Take 2 tablets by mouth daily.     promethazine (PHENERGAN) 25 MG tablet TAKE 1 TABLET(25 MG) BY MOUTH TWICE DAILY AS NEEDED (Patient taking differently: Take 25 mg by mouth daily.) 60 tablet 2   rizatriptan (MAXALT-MLT) 10 MG disintegrating tablet TAKE 1 TABLET BY MOUTH AS NEEDED FOR MIGRANE. MAY REPEAT IN 2 HOURS IF NEEDED 10 tablet 2   rosuvastatin (CRESTOR) 20 MG tablet TAKE 1 TABLET(20  MG) BY MOUTH DAILY 90 tablet 3   UBRELVY 100 MG TABS TAKE 1 TABLET BY MOUTH AS NEEDED. MAY REPEAT DOSE AFTER 2 HOURS. MAX 2 TABLETS IN 24 HOURS 30 tablet 0   No current facility-administered medications on file prior to visit.        ROS:  All others reviewed and negative.  Objective        PE:  BP 100/64 (BP Location: Left Arm, Patient Position: Sitting, Cuff Size: Normal)   Pulse (!) 102   Temp 98.8 F (37.1 C) (Oral)   Ht 5' 2.75" (1.594 m)   Wt 103 lb 12.8 oz (47.1 kg)   SpO2 98%   BMI 18.53 kg/m  Constitutional: Pt appears in NAD               HENT: Head: NCAT.                Right Ear: External ear normal.                 Left Ear: External ear normal.                Eyes: . Pupils are equal, round, and reactive to light. Conjunctivae and EOM are normal               Nose: without d/c or deformity               Neck: Neck supple. Gross normal ROM               Cardiovascular: Normal rate and regular rhythm.                 Pulmonary/Chest: Effort normal and breath sounds without rales or wheezing.                Abd:  Soft, NT, ND, + BS, no organomegaly               Neurological: Pt is alert. At baseline orientation, motor grossly intact               Skin: right upper ant thigh with 2 cm are red, tender , swlellig LE edema - none               Psychiatric: Pt behavior is normal without agitation   Micro: none  Cardiac tracings I have personally interpreted today:  none  Pertinent Radiological findings (summarize): none   Lab Results  Component Value Date   WBC 5.4 03/05/2021   HGB 12.7 03/05/2021   HCT 37.8 03/05/2021   PLT 272.0 03/05/2021   GLUCOSE 54 (L) 03/05/2021   CHOL 183 03/05/2021   TRIG 94.0 03/05/2021   HDL 53.70 03/05/2021   LDLCALC 111 (H) 03/05/2021   ALT 30 03/05/2021   AST 24 03/05/2021   NA 137 03/05/2021   K 4.2 03/05/2021   CL 104 03/05/2021   CREATININE 0.65 03/05/2021   BUN 9 03/05/2021   CO2 25 03/05/2021   TSH  0.59 03/05/2021   HGBA1C 5.2 03/05/2021   Assessment/Plan:  Keyri A Oakey is a 45 y.o. White or Caucasian [1] female with  has a past medical history of ADD (attention deficit disorder), Allergy, Anorexia nervosa without bulimia, Chronic constipation, Drug abuse (HCC), GAD (generalized anxiety disorder), GERD (gastroesophageal reflux disease), History of gastric restrictive surgery, History of gastric ulcer, History of Helicobacter pylori infection, HLD (hyperlipidemia) (02/16/2018), Impulse control disorder, Moderate major depression (HCC), Osteopenia, Osteoporosis, Post concussion syndrome, SMAS (superior mesenteric artery syndrome) (HCC), Ulnar neuropathy, and Urge urinary incontinence.  Vitamin D deficiency Last vitamin D Lab Results  Component Value Date   VD25OH 21.67 (L) 10/26/2020   Low, to start oral replacement at 4000 u qd  Hyperglycemia Lab Results  Component Value Date   HGBA1C 5.1 02/18/2019   Stable, pt to continue current medical treatment  - diet   HLD (hyperlipidemia) Lab Results  Component Value Date   LDLCALC 105 (H) 02/21/2020   Uncontrolled, goal ldl < 100, cont diet and crestor 20 as pt does not want check now, also for recheck labs  Cellulitis of right leg Right upper thigh new worsening, for doxy course,  to  f/u any worsening symptoms or concerns  Cellulitis of great toe, right Nicely resolved, ok to follow, has also seen podiatry  Followup: Return in about 6 months (around 09/05/2021).  Oliver Barre, MD 03/05/2021 10:58 PM [facility]

## 2021-03-05 NOTE — Assessment & Plan Note (Addendum)
Lab Results  Component Value Date   LDLCALC 105 (H) 02/21/2020   Uncontrolled, goal ldl < 100, cont diet and crestor 20 as pt does not want check now, also for recheck labs

## 2021-03-05 NOTE — Assessment & Plan Note (Signed)
Nicely resolved, ok to follow, has also seen podiatry

## 2021-03-05 NOTE — Assessment & Plan Note (Signed)
Last vitamin D Lab Results  Component Value Date   VD25OH 21.67 (L) 10/26/2020   Low, to start oral replacement at 4000 u qd

## 2021-03-05 NOTE — Assessment & Plan Note (Signed)
Right upper thigh new worsening, for doxy course,  to f/u any worsening symptoms or concerns

## 2021-03-14 DIAGNOSIS — M79641 Pain in right hand: Secondary | ICD-10-CM | POA: Diagnosis not present

## 2021-03-19 ENCOUNTER — Other Ambulatory Visit: Payer: Self-pay | Admitting: Internal Medicine

## 2021-03-19 ENCOUNTER — Ambulatory Visit: Payer: Medicare Other | Admitting: Podiatry

## 2021-03-20 ENCOUNTER — Other Ambulatory Visit: Payer: Self-pay

## 2021-03-20 ENCOUNTER — Encounter (HOSPITAL_COMMUNITY): Payer: Self-pay

## 2021-03-20 ENCOUNTER — Ambulatory Visit (HOSPITAL_COMMUNITY)
Admission: RE | Admit: 2021-03-20 | Discharge: 2021-03-20 | Disposition: A | Discharge status: Home / Self Care | Payer: Medicare Other | Source: Ambulatory Visit | Attending: Physician Assistant | Admitting: Physician Assistant

## 2021-03-20 DIAGNOSIS — K8689 Other specified diseases of pancreas: Secondary | ICD-10-CM

## 2021-03-20 DIAGNOSIS — Z9889 Other specified postprocedural states: Secondary | ICD-10-CM | POA: Diagnosis not present

## 2021-03-20 DIAGNOSIS — Z8719 Personal history of other diseases of the digestive system: Secondary | ICD-10-CM | POA: Diagnosis not present

## 2021-03-20 MED ORDER — IOHEXOL 350 MG/ML SOLN
80.0000 mL | Freq: Once | INTRAVENOUS | Status: AC | PRN
  Administered 2021-03-20: 80 mL via INTRAVENOUS

## 2021-03-22 DIAGNOSIS — M79641 Pain in right hand: Secondary | ICD-10-CM | POA: Diagnosis not present

## 2021-03-27 ENCOUNTER — Ambulatory Visit: Payer: Medicare Other | Admitting: Podiatry

## 2021-03-30 DIAGNOSIS — M79641 Pain in right hand: Secondary | ICD-10-CM | POA: Diagnosis not present

## 2021-04-02 ENCOUNTER — Encounter: Payer: Self-pay | Admitting: Internal Medicine

## 2021-04-05 NOTE — Progress Notes (Signed)
NEUROLOGY FOLLOW UP OFFICE NOTE  Karen Hahn [id number]  Assessment/Plan:   Migraine without aura, without status migrainosus, not intractable Left-sided carpal tunnel syndrome - symptoms aggravated Right-sided carpal tunnel syndrome and ulnar neuropathy at the elbow  Regarding left carpal tunnel syndrome:  advised to wear wrist splint and if no improvement in next 2 weeks, will recheck NCV-EMG due to worsening symptoms. Migraine prevention:  Aimovig 140mg  refilled Migraine rescue:  rizatriptan and Ubrelvy 100mg  Limit use of pain relievers to no more than 2 days out of week to prevent risk of rebound or medication-overuse headache. Keep headache diary Follow up 9 months   Subjective:  Karen Hahn is a 45 year old female with ADD, generalized anxiety disorder, depression and history of concussion and substance abuse whom I see for migraines presents for new issue, "delay of movement".    UPDATE: She underwent workup for her cognitive symptoms.  She underwent neuropsychological testing on 06/06/2020.  She gave low performance effort but overall did not demonstrate any abnormalities to suggest cognitive impairment but did demonstrate findings consistent with ADHD, depression and anxiety.    She also endorsed numbness in her hands.  NCV-EMG of right upper extremity on 01/09/2021 demonstrated right ulnar neuropathy with slowing at the elbow.  She underwent surgery for both.  She then noted numbness in the left hand and forearm.  NCV-EMG of left upper extremity on 02/20/2021 showed evidence of mild carpal tunnel syndrome.  She had a steroid injection in the left carpal tunnel which was ineffective.  Over past 2 days, she has had significant increase in paresthesias of the hand and forearm as well as pain in the wrist.  Trouble closing hand.     Migraines are controlled. Intensity:  Moderate to severe Duration:  2 hours Frequency:  once a week. Current NSAIDS: none Current analgesics:   Percocet  Current triptans:  Maxalt MLT10mg  (rarely takes) Current ergotamine:  none Current anti-emetic:  Promethazine 25mg  Current muscle relaxants:  none Current anti-anxiolytic:  none Current sleep aide:  none Current Antihypertensive medications:  none Current Antidepressant medications:  none Current Anticonvulsant medications:  Lamotrigine 300mg  daily Current anti-CGRP:  Aimovig 140mg  monthly; Ubrelvy Current Vitamins/Herbal/Supplements:  D, ferrous gluconate Current Antihistamines/Decongestants:  none Other therapy:  none Hormone/birth control:  none   Caffeine:  1 cup of coffee daily Diet:  Tries to drink water throughout day.  Occasional soda. Exercise:  No Depression:  Not currently; Anxiety:  Yes, but manageable Other pain:  no Sleep hygiene:  Good.  6-8 hours a night   HISTORY: No prior history of headache.  No preceding trigger such as recent head trauma, new medication or exposure to new environmentIn July, she had 3 episodes where she woke up with blurred vision for 5 minutes and resolved.  In August 2020, she began experiencing a daily headaches that soon became a persistent headache that has been gradually increasing in intensity.  They are now severe, throbbing, bitemporal and frontal headaches associated with intermittent photophobia. On a few occasions, she had felt nauseous and vomited.  She reports fatigue.  No fever, chills,unilateral numbness or weakness.  Sometimes closing her eyes help reduce intensity.  She has prior concussion in 2017 but no recent preceding head trauma.  History of depression and anxiety but this has been stable.     She was prescribed rizatriptan and topiramate by her PCP.  Rizatriptan helped a little bit.  Due to potential anorexia, she did not start topiramate.  She was told by radiology that she cannot have a brain MRI due to the interstem implant in her back.   Since around 2017, she frequently feels cold.  She reports that her body  (particularly hands and feet) fell cold (internally but also to the touch).  No numbness, tingling, skin discoloration, palpitations, increased/decreased sweating, or lightheadedness.  TSH and B12 from July 2020 were 2.60 and 473 respectively.  To further evaluate sensory changes/cold intolerance, she underwent NCV-EMG on 08/11/2019, which was normal.  In 2021, she began noticing a "delay of movement".  If she wants to tie a trash bag or buckle her seatbelt and she can't do it.  She needs to shake her hands out and it may take up to 5 minutes.  She reports dropping objects.  She also reports difficulty speaking.  When she speaks, it sometimes comes out jumbled.  She also reports long-term and short-term memory problems.     05/19/2019 CTA of head and neck:  Normal.   Past NSAIDS:  Ibuprofen (not effective) Past analgesics:  tramadol Past abortive triptans:  none Past abortive ergotamine:  none Past muscle relaxants:  Flexeril Past anti-emetic:  Reglan, Zofran 8mg  Past antihypertensive medications:  none Past antidepressant/antipsychotic medications:  nortriptyline (photosensitivity), bupropion, Haldol, mirtazapine, Zyprexa, sertraline 200mg  Past anticonvulsant medications:  topiramate (contraindicated due to history of anorexia) Past anti-CGRP:  none Past vitamins/Herbal/Supplements:  none Past antihistamines/decongestants:  Zyrtec Other past therapies:  none     Family history of headache:  Mother (migraines, stopped after CEA for bilateral ICA stenosis.  PAST MEDICAL HISTORY: Past Medical History:  Diagnosis Date   ADD (attention deficit disorder)    Allergy    Anorexia nervosa without bulimia    Chronic constipation    Drug abuse (HCC)    GAD (generalized anxiety disorder)    GERD (gastroesophageal reflux disease)    History of gastric restrictive surgery    History of gastric ulcer    History of Helicobacter pylori infection    after 2003   HLD (hyperlipidemia) 02/16/2018    Impulse control disorder    Moderate major depression (HCC)    Osteopenia    Osteoporosis    Post concussion syndrome    fell 10/20/2015   SMAS (superior mesenteric artery syndrome) (HCC)    Ulnar neuropathy    Urge urinary incontinence     MEDICATIONS: Current Outpatient Medications on File Prior to Visit  Medication Sig Dispense Refill   Calcium-Iron-Vit D-Vit K 500-08-998-40 MG-UNT-MCG CHEW Chew 1 each by mouth daily.     doxycycline (VIBRA-TABS) 100 MG tablet Take 1 tablet (100 mg total) by mouth 2 (two) times daily. 20 tablet 0   Erenumab-aooe (AIMOVIG) 140 MG/ML SOAJ Inject 140 mg into the skin every 30 (thirty) days. 1 mL 3   ferrous gluconate (FERGON) 324 MG tablet Take 1 tablet (324 mg total) by mouth daily with breakfast. 90 tablet 3   ibandronate (BONIVA) 150 MG tablet Take 1 tablet (150 mg total) by mouth every 30 (thirty) days. Take in the morning with a full glass of water, on an empty stomach, and do not take anything else by mouth or lie down for the next 30 min. 3 tablet 9   ibuprofen (ADVIL) 600 MG tablet TAKE 1 TABLET(600 MG) BY MOUTH EVERY 6 HOURS AS NEEDED 60 tablet 0   lamoTRIgine (LAMICTAL) 150 MG tablet TAKE 2 TABLETS(300 MG) BY MOUTH DAILY 180 tablet 1   lidocaine (XYLOCAINE) 5 % ointment Apply 1  application topically as needed. 35.44 g 0   lubiprostone (AMITIZA) 8 MCG capsule TAKE 1 CAPSULE(8 MCG) BY MOUTH TWICE DAILY WITH A MEAL 60 capsule 6   naproxen (NAPROSYN) 500 MG tablet Take 1 tablet (500 mg total) by mouth 2 (two) times daily as needed. 20 tablet 3   Norethindrone Acetate-Ethinyl Estrad-FE (LOESTRIN 24 FE) 1-20 MG-MCG(24) tablet Take 1 tablet by mouth daily. 28 tablet 11   omeprazole (PRILOSEC) 40 MG capsule Take 1 capsule (40 mg total) by mouth daily. 90 capsule 3   ondansetron (ZOFRAN) 4 MG tablet Take 1 tablet (4 mg total) by mouth every 8 (eight) hours as needed for nausea or vomiting. 30 tablet 2   PARoxetine (PAXIL) 20 MG tablet Take 1 tablet (20 mg  total) by mouth daily. 90 tablet 1   Pediatric Multivitamins-Iron (FLINTSTONES COMPLETE PO) Take 2 tablets by mouth daily.     promethazine (PHENERGAN) 25 MG tablet TAKE 1 TABLET(25 MG) BY MOUTH TWICE DAILY AS NEEDED (Patient taking differently: Take 25 mg by mouth daily.) 60 tablet 2   rizatriptan (MAXALT-MLT) 10 MG disintegrating tablet TAKE 1 TABLET BY MOUTH AS NEEDED FOR MIGRANE. MAY REPEAT IN 2 HOURS IF NEEDED 10 tablet 2   rosuvastatin (CRESTOR) 20 MG tablet TAKE 1 TABLET(20 MG) BY MOUTH DAILY 90 tablet 3   UBRELVY 100 MG TABS TAKE 1 TABLET BY MOUTH AS NEEDED. MAY REPEAT DOSE AFTER 2 HOURS. MAX 2 TABLETS IN 24 HOURS 30 tablet 0   No current facility-administered medications on file prior to visit.    ALLERGIES: Allergies  Allergen Reactions   Bactrim [Sulfamethoxazole-Trimethoprim] Other (See Comments)    Yeast infection   Penicillins Other (See Comments)    Has patient had a PCN reaction causing immediate rash, facial/tongue/throat swelling, SOB or lightheadedness with hypotension: NO (UTI, YEAST INFECTION) Has patient had a PCN reaction causing severe rash involving mucus membranes or skin necrosis: NO Has patient had a PCN reaction that required hospitalization NO Has patient had a PCN reaction occurring within the last 10 years: NO If all of the above answers are "NO", then may proceed with Cephalosporin use.    Ciprofloxacin Itching   Morphine And Related Itching and Other (See Comments)    "whole body feels tight feeling"   Reglan [Metoclopramide]    Benadryl [Diphenhydramine Hcl] Palpitations   Diflucan [Fluconazole] Rash    FAMILY HISTORY: Family History  Problem Relation Age of Onset   Alcohol abuse Mother    Anxiety disorder Mother    Depression Mother        severe with hx of suicide attempt. treated with ECT   COPD Mother    Cancer Mother        Cervical   Stroke Mother    Anxiety disorder Father    Drug abuse Father    COPD Father    Alcohol abuse  Maternal Uncle    Anxiety disorder Maternal Grandmother    Depression Maternal Grandmother    ADD / ADHD Brother    Breast cancer Paternal Grandmother 28   Stomach cancer Neg Hx    Colon cancer Neg Hx    Rectal cancer Neg Hx    Esophageal cancer Neg Hx       Objective:  Blood pressure 106/67, pulse (!) 103, height  (1.575 m), weight 107 lb 6.4 oz (48.7 kg), last menstrual period 03/18/2021, SpO2 100 %. General: No acute distress.  Patient appears well-groomed.   Head:  Normocephalic/atraumatic Eyes:  Fundi examined but not visualized Neck: supple, no paraspinal tenderness, full range of motion Heart:  Regular rate and rhythm Lungs:  Clear to auscultation bilaterally Back: No paraspinal tenderness Neurological Exam: alert and oriented to person, place, and time.  Speech fluent and not dysarthric, language intact.  Positive Tinel at left wrist and elbow.  Grip, APB and interossei intact.   Shon Millet, DO  CC: Oliver Barre, MD

## 2021-04-06 ENCOUNTER — Encounter: Payer: Self-pay | Admitting: Internal Medicine

## 2021-04-06 ENCOUNTER — Ambulatory Visit (INDEPENDENT_AMBULATORY_CARE_PROVIDER_SITE_OTHER): Payer: Medicare Other | Admitting: Neurology

## 2021-04-06 ENCOUNTER — Other Ambulatory Visit: Payer: Self-pay

## 2021-04-06 ENCOUNTER — Encounter: Payer: Self-pay | Admitting: Neurology

## 2021-04-06 VITALS — BP 106/67 | HR 103 | Ht 62.0 in | Wt 107.4 lb

## 2021-04-06 DIAGNOSIS — G5621 Lesion of ulnar nerve, right upper limb: Secondary | ICD-10-CM

## 2021-04-06 DIAGNOSIS — G43009 Migraine without aura, not intractable, without status migrainosus: Secondary | ICD-10-CM | POA: Diagnosis not present

## 2021-04-06 DIAGNOSIS — G5602 Carpal tunnel syndrome, left upper limb: Secondary | ICD-10-CM | POA: Diagnosis not present

## 2021-04-06 DIAGNOSIS — G5601 Carpal tunnel syndrome, right upper limb: Secondary | ICD-10-CM | POA: Diagnosis not present

## 2021-04-06 MED ORDER — AIMOVIG 140 MG/ML ~~LOC~~ SOAJ: 140.0000 mg | SUBCUTANEOUS | 5 refills | Status: DC

## 2021-04-06 NOTE — Patient Instructions (Signed)
Refilled Aimovig When you get a migraine, use rizatriptan and Ubrelvy as needed Wear wrist splint at all times on left wrist - if no improvement in 2 weeks, contact me and we can repeat nerve study

## 2021-04-19 DIAGNOSIS — M79601 Pain in right arm: Secondary | ICD-10-CM | POA: Diagnosis not present

## 2021-04-19 DIAGNOSIS — M79641 Pain in right hand: Secondary | ICD-10-CM | POA: Diagnosis not present

## 2021-04-20 DIAGNOSIS — G5621 Lesion of ulnar nerve, right upper limb: Secondary | ICD-10-CM | POA: Diagnosis not present

## 2021-05-05 ENCOUNTER — Other Ambulatory Visit: Payer: Self-pay | Admitting: Neurology

## 2021-05-05 ENCOUNTER — Other Ambulatory Visit: Payer: Self-pay | Admitting: Internal Medicine

## 2021-05-15 ENCOUNTER — Other Ambulatory Visit (HOSPITAL_COMMUNITY): Payer: Self-pay | Admitting: Psychiatry

## 2021-05-15 DIAGNOSIS — F3181 Bipolar II disorder: Secondary | ICD-10-CM

## 2021-05-16 ENCOUNTER — Ambulatory Visit (INDEPENDENT_AMBULATORY_CARE_PROVIDER_SITE_OTHER): Payer: Medicare Other

## 2021-05-16 ENCOUNTER — Ambulatory Visit (INDEPENDENT_AMBULATORY_CARE_PROVIDER_SITE_OTHER): Payer: Medicare Other | Admitting: Internal Medicine

## 2021-05-16 ENCOUNTER — Encounter: Payer: Self-pay | Admitting: Internal Medicine

## 2021-05-16 ENCOUNTER — Other Ambulatory Visit: Payer: Self-pay

## 2021-05-16 VITALS — BP 104/60 | HR 105 | Temp 99.1°F | Ht 62.0 in | Wt 106.0 lb

## 2021-05-16 DIAGNOSIS — R110 Nausea: Secondary | ICD-10-CM | POA: Diagnosis not present

## 2021-05-16 DIAGNOSIS — E559 Vitamin D deficiency, unspecified: Secondary | ICD-10-CM

## 2021-05-16 DIAGNOSIS — R1033 Periumbilical pain: Secondary | ICD-10-CM

## 2021-05-16 DIAGNOSIS — R739 Hyperglycemia, unspecified: Secondary | ICD-10-CM

## 2021-05-16 DIAGNOSIS — R109 Unspecified abdominal pain: Secondary | ICD-10-CM | POA: Diagnosis not present

## 2021-05-16 DIAGNOSIS — K432 Incisional hernia without obstruction or gangrene: Secondary | ICD-10-CM | POA: Diagnosis not present

## 2021-05-16 NOTE — Assessment & Plan Note (Signed)
Last vitamin D °Lab Results  °Component Value Date  ° VD25OH 23.80 (L) 03/05/2021  ° °Low, to start oral replacement ° °

## 2021-05-16 NOTE — Progress Notes (Signed)
Patient ID: Karen Hahn, female   DOB: 1976-05-12, 45 y.o.   MRN: [id number]       Chief Complaint: follow up ? Hernia with pain, vit d def and hyperglycemia       HPI:  Karen Hahn is a 45 y.o. female here with c/o 3 wks onset new small swelling to the mid aspect of a prior central abdomen surgical scar; the swelling itself is soft, mild tender but reducible and comes out with valsalva maneuver.  Denies worsening reflux, dysphagia, bowel change or blood, but has recurring n/v and mid abd discomfort ongoing for months.  Pt denies chest pain, increased sob or doe, wheezing, orthopnea, PND, increased LE swelling, palpitations, dizziness or syncope.   Pt denies polydipsia, polyuria, or new focal neuro s/s.   Pt denies fever, wt loss, night sweats, loss of appetite, or other constitutional symptoms         Wt Readings from Last 3 Encounters:  05/16/21 106 lb (48.1 kg)  04/06/21 107 lb 6.4 oz (48.7 kg)  03/05/21 103 lb 12.8 oz (47.1 kg)   BP Readings from Last 3 Encounters:  05/16/21 104/60  04/06/21 106/67  03/05/21 100/64         Past Medical History:  Diagnosis Date   ADD (attention deficit disorder)    Allergy    Anorexia nervosa without bulimia    Chronic constipation    Drug abuse (HCC)    GAD (generalized anxiety disorder)    GERD (gastroesophageal reflux disease)    History of gastric restrictive surgery    History of gastric ulcer    History of Helicobacter pylori infection    after 2003   HLD (hyperlipidemia) 02/16/2018   Impulse control disorder    Moderate major depression (HCC)    Osteopenia    Osteoporosis    Post concussion syndrome    fell 10/20/2015   SMAS (superior mesenteric artery syndrome) (HCC)    Ulnar neuropathy    Urge urinary incontinence    Past Surgical History:  Procedure Laterality Date   DILATION AND CURETTAGE OF UTERUS  2005   INTERSTIM IMPLANT PLACEMENT N/A 10/18/2015   Procedure: Karen Hahn IMPLANT FIRST STAGE;  Surgeon: Karen Field, MD;  Location: [facility];  Service: Urology;  Laterality: N/A;   INTERSTIM IMPLANT PLACEMENT N/A 10/18/2015   Procedure: Karen Hahn IMPLANT SECOND STAGE;  Surgeon: Karen Field, MD;  Location: [facility];  Service: Urology;  Laterality: N/A;   LAPAROSCOPIC GASTRIC RESTRICTIVE DUODENAL PROCEDURE (DUODENAL SWITCH)  12/ 2003   ORIF RIGHT WRIST FX  2013   REMOVAL HARDWARE X2 in  2013--  No retained hardware    reports that she quit smoking about 4 years ago. Her smoking use included cigarettes. She has a 0.50 pack-year smoking history. She has never used smokeless tobacco. She reports that she does not drink alcohol and does not use drugs. family history includes ADD / ADHD in her brother; Alcohol abuse in her maternal uncle and mother; Anxiety disorder in her father, maternal grandmother, and mother; Breast cancer (age of onset: 5) in her paternal grandmother; COPD in her father and mother; Cancer in her mother; Depression in her maternal grandmother and mother; Drug abuse in her father; Stroke in her mother. Allergies  Allergen Reactions   Bactrim [Sulfamethoxazole-Trimethoprim] Other (See Comments)    Yeast infection   Penicillins Other (See Comments)    Has patient had a PCN reaction causing immediate rash, facial/tongue/throat swelling, SOB  or lightheadedness with hypotension: NO (UTI, YEAST INFECTION) Has patient had a PCN reaction causing severe rash involving mucus membranes or skin necrosis: NO Has patient had a PCN reaction that required hospitalization NO Has patient had a PCN reaction occurring within the last 10 years: NO If all of the above answers are "NO", then may proceed with Cephalosporin use.    Ciprofloxacin Itching   Morphine And Related Itching and Other (See Comments)    "whole body feels tight feeling"   Reglan [Metoclopramide]    Benadryl [Diphenhydramine Hcl] Palpitations   Diflucan [Fluconazole] Rash   Current Outpatient  Medications on File Prior to Visit  Medication Sig Dispense Refill   Calcium-Iron-Vit D-Vit K [id number] MG-UNT-MCG CHEW Chew 1 each by mouth daily.     Erenumab-aooe (AIMOVIG) 140 MG/ML SOAJ Inject 140 mg into the skin every 30 (thirty) days. 1 mL 5   ferrous gluconate (FERGON) 324 MG tablet Take 1 tablet (324 mg total) by mouth daily with breakfast. 90 tablet 3   ibandronate (BONIVA) 150 MG tablet Take 1 tablet (150 mg total) by mouth every 30 (thirty) days. Take in the morning with a full glass of water, on an empty stomach, and do not take anything else by mouth or lie down for the next 30 min. 3 tablet 9   ibuprofen (ADVIL) 600 MG tablet TAKE 1 TABLET(600 MG) BY MOUTH EVERY 6 HOURS AS NEEDED 60 tablet 0   lamoTRIgine (LAMICTAL) 150 MG tablet TAKE 2 TABLETS(300 MG) BY MOUTH DAILY 180 tablet 1   lidocaine (XYLOCAINE) 5 % ointment Apply 1 application topically as needed. 35.44 g 0   lubiprostone (AMITIZA) 8 MCG capsule TAKE 1 CAPSULE(8 MCG) BY MOUTH TWICE DAILY WITH A MEAL 60 capsule 6   naproxen (NAPROSYN) 500 MG tablet Take 1 tablet (500 mg total) by mouth 2 (two) times daily as needed. 20 tablet 3   omeprazole (PRILOSEC) 40 MG capsule Take 1 capsule (40 mg total) by mouth daily. 90 capsule 3   ondansetron (ZOFRAN) 4 MG tablet Take 1 tablet (4 mg total) by mouth every 8 (eight) hours as needed for nausea or vomiting. 30 tablet 2   PARoxetine (PAXIL) 20 MG tablet Take 1 tablet (20 mg total) by mouth daily. 90 tablet 1   Pediatric Multivitamins-Iron (FLINTSTONES COMPLETE PO) Take 2 tablets by mouth daily.     promethazine (PHENERGAN) 25 MG tablet TAKE 1 TABLET(25 MG) BY MOUTH TWICE DAILY AS NEEDED (Patient taking differently: Take 25 mg by mouth daily.) 60 tablet 2   rizatriptan (MAXALT-MLT) 10 MG disintegrating tablet TAKE 1 TABLET BY MOUTH AS NEEDED FOR MIGRANE. MAY REPEAT IN 2 HOURS IF NEEDED 10 tablet 2   rosuvastatin (CRESTOR) 20 MG tablet TAKE 1 TABLET(20 MG) BY MOUTH DAILY 90 tablet 3    UBRELVY 100 MG TABS TAKE 1 TABLET BY MOUTH AS NEEDED MAY REPEAT DOSE AFTER 2 HOURS MAX 2 TABLETS IN 24 HOURS 30 tablet 0   Norethindrone Acetate-Ethinyl Estrad-FE (LOESTRIN 24 FE) 1-20 MG-MCG(24) tablet Take 1 tablet by mouth daily. (Patient not taking: No sig reported) 28 tablet 11   No current facility-administered medications on file prior to visit.        ROS:  All others reviewed and negative.  Objective        PE:  BP 104/60 (BP Location: Right Arm, Patient Position: Sitting, Cuff Size: Normal)   Pulse (!) 105   Temp 99.1 F (37.3 C) (Oral)   Ht 5\' 2"  (  1.575 m)   Wt 106 lb (48.1 kg)   LMP 05/09/2021   SpO2 97%   BMI 19.39 kg/m                 Constitutional: Pt appears in NAD               HENT: Head: NCAT.                Right Ear: External ear normal.                 Left Ear: External ear normal.                Eyes: . Pupils are equal, round, and reactive to light. Conjunctivae and EOM are normal               Nose: without d/c or deformity               Neck: Neck supple. Gross normal ROM               Cardiovascular: Normal rate and regular rhythm.                 Pulmonary/Chest: Effort normal and breath sounds without rales or wheezing.                Abd:  Soft, NT, ND, + BS, no organomegaly               Neurological: Pt is alert. At baseline orientation, motor grossly intact               Skin: Skin is warm. No rashes, no other new lesions, LE edema - none               Psychiatric: Pt behavior is normal without agitation   Micro: none  Cardiac tracings I have personally interpreted today:  none  Pertinent Radiological findings (summarize): none   Lab Results  Component Value Date   WBC 5.4 03/05/2021   HGB 12.7 03/05/2021   HCT 37.8 03/05/2021   PLT 272.0 03/05/2021   GLUCOSE 54 (L) 03/05/2021   CHOL 183 03/05/2021   TRIG 94.0 03/05/2021   HDL 53.70 03/05/2021   LDLCALC 111 (H) 03/05/2021   ALT 30 03/05/2021   AST 24 03/05/2021   NA 137  03/05/2021   K 4.2 03/05/2021   CL 104 03/05/2021   CREATININE 0.65 03/05/2021   BUN 9 03/05/2021   CO2 25 03/05/2021   TSH 0.59 03/05/2021   HGBA1C 5.2 03/05/2021   Assessment/Plan:  Karen Hahn is a 45 y.o. White or Caucasian [1] female with  has a past medical history of ADD (attention deficit disorder), Allergy, Anorexia nervosa without bulimia, Chronic constipation, Drug abuse (HCC), GAD (generalized anxiety disorder), GERD (gastroesophageal reflux disease), History of gastric restrictive surgery, History of gastric ulcer, History of Helicobacter pylori infection, HLD (hyperlipidemia) (02/16/2018), Impulse control disorder, Moderate major depression (HCC), Osteopenia, Osteoporosis, Post concussion syndrome, SMAS (superior mesenteric artery syndrome) (HCC), Ulnar neuropathy, and Urge urinary incontinence.  Vitamin D deficiency Last vitamin D Lab Results  Component Value Date   VD25OH 23.80 (L) 03/05/2021   Low, to start oral replacement   Hyperglycemia Lab Results  Component Value Date   HGBA1C 5.2 03/05/2021   Stable, pt to continue current medical treatment  - diet   Incisional hernia, without obstruction or gangrene Relatively small, mild symptomatic, for general surgury referral  Periumbilical abdominal pain Has recurring  n/v but doubt obstruction but for abd film today  Followup: Return if symptoms worsen or fail to improve.  Oliver Barre, MD 05/19/2021 3:38 PM [facility]

## 2021-05-16 NOTE — Patient Instructions (Addendum)
You appear to have an "Incisional Hernia" or some might call it a "Ventral Hernia"  It is mildly painful but not an emergency, so ;    Please go to the XRAY Department in the first floor for the x-ray testing  You will be contacted regarding the referral for: General Surgury - hopefully you will be called soon  You will be contacted by phone if any changes need to be made immediately.  Otherwise, you will receive a letter about your results with an explanation, but please check with MyChart first.  Please remember to sign up for MyChart if you have not done so, as this will be important to you in the future with finding out test results, communicating by private email, and scheduling acute appointments online when needed.

## 2021-05-17 ENCOUNTER — Encounter: Payer: Self-pay | Admitting: Internal Medicine

## 2021-05-18 DIAGNOSIS — G5603 Carpal tunnel syndrome, bilateral upper limbs: Secondary | ICD-10-CM | POA: Diagnosis not present

## 2021-05-19 ENCOUNTER — Encounter: Payer: Self-pay | Admitting: Internal Medicine

## 2021-05-19 DIAGNOSIS — K432 Incisional hernia without obstruction or gangrene: Secondary | ICD-10-CM | POA: Insufficient documentation

## 2021-05-19 DIAGNOSIS — R1033 Periumbilical pain: Secondary | ICD-10-CM | POA: Insufficient documentation

## 2021-05-19 NOTE — Assessment & Plan Note (Signed)
Lab Results  Component Value Date   HGBA1C 5.2 03/05/2021   Stable, pt to continue current medical treatment  - diet

## 2021-05-19 NOTE — Assessment & Plan Note (Signed)
Has recurring n/v but doubt obstruction but for abd film today

## 2021-05-19 NOTE — Assessment & Plan Note (Signed)
Relatively small, mild symptomatic, for general surgury referral

## 2021-06-05 ENCOUNTER — Encounter: Payer: Self-pay | Admitting: Internal Medicine

## 2021-06-07 ENCOUNTER — Telehealth (HOSPITAL_BASED_OUTPATIENT_CLINIC_OR_DEPARTMENT_OTHER): Payer: Medicare Other | Admitting: Psychiatry

## 2021-06-07 ENCOUNTER — Other Ambulatory Visit: Payer: Self-pay

## 2021-06-07 ENCOUNTER — Other Ambulatory Visit: Payer: Self-pay | Admitting: Internal Medicine

## 2021-06-07 DIAGNOSIS — F411 Generalized anxiety disorder: Secondary | ICD-10-CM | POA: Diagnosis not present

## 2021-06-07 DIAGNOSIS — F3181 Bipolar II disorder: Secondary | ICD-10-CM | POA: Diagnosis not present

## 2021-06-07 DIAGNOSIS — F639 Impulse disorder, unspecified: Secondary | ICD-10-CM | POA: Diagnosis not present

## 2021-06-07 DIAGNOSIS — F3281 Premenstrual dysphoric disorder: Secondary | ICD-10-CM | POA: Diagnosis not present

## 2021-06-07 DIAGNOSIS — F5000 Anorexia nervosa, unspecified: Secondary | ICD-10-CM

## 2021-06-07 MED ORDER — LAMOTRIGINE 150 MG PO TABS: ORAL_TABLET | ORAL | 0 refills | Status: DC

## 2021-06-07 MED ORDER — SERTRALINE HCL 50 MG PO TABS: 50.0000 mg | ORAL_TABLET | Freq: Every day | ORAL | 1 refills | Status: DC

## 2021-06-07 NOTE — Progress Notes (Signed)
Virtual Visit via Telephone Note  I connected with Karen Hahn on 06/07/21 at  4:00 PM EDT by telephone and verified that I am speaking with the correct person using two identifiers.  Location: Patient: driving Provider: office   I discussed the limitations, risks, security and privacy concerns of performing an evaluation and management service by telephone and the availability of in person appointments. I also discussed with the patient that there may be a patient responsible charge related to this service. The patient expressed understanding and agreed to proceed.   History of Present Illness: Karen Hahn shares she is doing well. She is worried that her nephew might be autistic. Her anxiety is situational and manageable. It is the same as it has always been. She has not feeling depressed at all. Karen Hahn states sleep and appetite are good. She has not lost any weight and is not focused on her weight at all. Irritability is only an issue when she has her menstrual cycle and the Paxil is not helping.  It helps to care for nephew. She denies SI/HI. Pt denies recent manic and hypomanic symptoms including periods of decreased need for sleep, increased energy, mood lability, impulsivity, FOI, and excessive spending. Karen Hahn is having carpel tunnel surgery on her left hand next month. She has an abdominal hernia and is being referred to a Careers adviser.     Observations/Objective:  General Appearance: unable to assess  Eye Contact:  unable to assess  Speech:  Clear and Coherent and Normal Rate  Volume:  Normal  Mood:  Euthymic  Affect:  Full Range  Thought Process:  Goal Directed, Linear, and Descriptions of Associations: Intact  Orientation:  Full (Time, Place, and Person)  Thought Content:  Logical  Suicidal Thoughts:  No  Homicidal Thoughts:  No  Memory:  Immediate;   Good  Judgement:  Good  Insight:  Good  Psychomotor Activity: unable to assess  Concentration:  Concentration: Good  Recall:  Good   Fund of Knowledge:  Good  Language:  Good  Akathisia:  unable to assess  Handed:  unable to assess  AIMS (if indicated):     Assets:  Communication Skills Desire for Improvement Financial Resources/Insurance Housing Social Support Talents/Skills Transportation Vocational/Educational  ADL's:  unable to assess  Cognition:  WNL  Sleep:        Assessment and Plan: Depression screen [facility] 2/9 06/07/2021 01/11/2021 09/27/2020 09/20/2020 02/21/2020  Decreased Interest 0 0 0 0 0  Down, Depressed, Hopeless 0 1 0 0 0  PHQ - 2 Score 0 1 0 0 0  Altered sleeping - - 0 - -  Tired, decreased energy - - 0 - -  Change in appetite - - 0 - -  Feeling bad or failure about yourself  - - 0 - -  Trouble concentrating - - 0 - -  Moving slowly or fidgety/restless - - 0 - -  Suicidal thoughts - - 0 - -  PHQ-9 Score - - 0 - -  Some recent data might be hidden    Flowsheet Row Video Visit from 06/07/2021 in [facility] Video Visit from 01/11/2021 in [facility]  C-SSRS RISK CATEGORY No Risk No Risk      D/c Paxil. Previously SE of Prozac was serious SI.  Start trial of Zoloft and cross taper with Paxil  1. Impulse control disorder - lamoTRIgine (LAMICTAL) 150 MG tablet; TAKE 2 TABLETS(300 MG) BY MOUTH DAILY  Dispense: 180 tablet;  Refill: 0  2. Bipolar II disorder (HCC) - lamoTRIgine (LAMICTAL) 150 MG tablet; TAKE 2 TABLETS(300 MG) BY MOUTH DAILY  Dispense: 180 tablet; Refill: 0  3. GAD (generalized anxiety disorder) - sertraline (ZOLOFT) 50 MG tablet; Take 1 tablet (50 mg total) by mouth daily.  Dispense: 30 tablet; Refill: 1  4. Anorexia nervosa  5. PMDD (premenstrual dysphoric disorder) - lamoTRIgine (LAMICTAL) 150 MG tablet; TAKE 2 TABLETS(300 MG) BY MOUTH DAILY  Dispense: 180 tablet; Refill: 0 - sertraline (ZOLOFT) 50 MG tablet; Take 1 tablet (50 mg total) by mouth daily.  Dispense: 30 tablet; Refill:  1   Follow Up Instructions: In 1-2 months or sooner if needed   I discussed the assessment and treatment plan with the patient. The patient was provided an opportunity to ask questions and all were answered. The patient agreed with the plan and demonstrated an understanding of the instructions.   The patient was advised to call back or seek an in-person evaluation if the symptoms worsen or if the condition fails to improve as anticipated.  I provided 23 minutes of non-face-to-face time during this encounter.   Oletta Darter, MD

## 2021-06-20 ENCOUNTER — Encounter: Payer: Self-pay | Admitting: Internal Medicine

## 2021-06-20 DIAGNOSIS — R922 Inconclusive mammogram: Secondary | ICD-10-CM | POA: Diagnosis not present

## 2021-06-20 DIAGNOSIS — N6452 Nipple discharge: Secondary | ICD-10-CM | POA: Diagnosis not present

## 2021-06-20 LAB — HM MAMMOGRAPHY

## 2021-06-22 DIAGNOSIS — K432 Incisional hernia without obstruction or gangrene: Secondary | ICD-10-CM | POA: Diagnosis not present

## 2021-06-24 ENCOUNTER — Other Ambulatory Visit: Payer: Self-pay | Admitting: Internal Medicine

## 2021-07-10 ENCOUNTER — Other Ambulatory Visit: Payer: Self-pay | Admitting: Physician Assistant

## 2021-07-19 ENCOUNTER — Other Ambulatory Visit: Payer: Self-pay

## 2021-07-19 ENCOUNTER — Telehealth (HOSPITAL_BASED_OUTPATIENT_CLINIC_OR_DEPARTMENT_OTHER): Payer: Medicare Other | Admitting: Psychiatry

## 2021-07-19 ENCOUNTER — Encounter (HOSPITAL_COMMUNITY): Payer: Self-pay | Admitting: Psychiatry

## 2021-07-19 DIAGNOSIS — F3281 Premenstrual dysphoric disorder: Secondary | ICD-10-CM | POA: Diagnosis not present

## 2021-07-19 DIAGNOSIS — F411 Generalized anxiety disorder: Secondary | ICD-10-CM | POA: Diagnosis not present

## 2021-07-19 DIAGNOSIS — F639 Impulse disorder, unspecified: Secondary | ICD-10-CM | POA: Diagnosis not present

## 2021-07-19 DIAGNOSIS — F3181 Bipolar II disorder: Secondary | ICD-10-CM | POA: Diagnosis not present

## 2021-07-19 DIAGNOSIS — F5000 Anorexia nervosa, unspecified: Secondary | ICD-10-CM

## 2021-07-19 MED ORDER — LAMOTRIGINE 150 MG PO TABS: ORAL_TABLET | ORAL | 0 refills | Status: DC

## 2021-07-19 MED ORDER — SERTRALINE HCL 100 MG PO TABS: 100.0000 mg | ORAL_TABLET | Freq: Every day | ORAL | 0 refills | Status: DC

## 2021-07-19 NOTE — Progress Notes (Signed)
Virtual Visit via Video Note  I connected with Karen Hahn on 07/19/21 at  3:30 PM EST by a video enabled telemedicine application and verified that I am speaking with the correct person using two identifiers.  Location: Patient: home Provider: office   I discussed the limitations of evaluation and management by telemedicine and the availability of in person appointments. The patient expressed understanding and agreed to proceed.  History of Present Illness: Karen Hahn is doing well. She is busy taking care of her nephew most days. She really loves him and enjoys doing it. Her appetite is good and she is eating well. She denies any weight loss and has not been focusing on her weight. She has been a little more irritable and a little more depressed. She is significantly worse and she is picking her face more than usual. She is not aware of any triggers. She has been depressed about 3 days out of the last 14. Pt denies recent manic and hypomanic symptoms including periods of decreased need for sleep, increased energy, mood lability, impulsivity, FOI, and excessive spending. She denies any impulsive behaviors. Karen Hahn denies any fighting and states she is getting along with her roommates.     Observations/Objective: Psychiatric Specialty Exam: ROS  There were no vitals taken for this visit.There is no height or weight on file to calculate BMI.  General Appearance: Casual  Eye Contact:  Good  Speech:  Clear and Coherent and Normal Rate  Volume:  Normal  Mood:  Anxious and Depressed  Affect:  Full Range  Thought Process:  Goal Directed, Linear, and Descriptions of Associations: Intact  Orientation:  Full (Time, Place, and Person)  Thought Content:  Logical  Suicidal Thoughts:  No  Homicidal Thoughts:  No  Memory:  Immediate;   Good  Judgement:  Good  Insight:  Good  Psychomotor Activity:  Normal  Concentration:  Concentration: Good  Recall:  Good  Fund of Knowledge:  Good  Language:   Good  Akathisia:  No  Handed:  Right  AIMS (if indicated):     Assets:  Communication Skills Desire for Improvement Financial Resources/Insurance Housing Leisure Time Resilience Social Support Talents/Skills Transportation Vocational/Educational  ADL's:  Intact  Cognition:  WNL  Sleep:        Assessment and Plan: Depression screen [facility] 2/9 07/19/2021 06/07/2021 01/11/2021 09/27/2020 09/20/2020  Decreased Interest 0 0 0 0 0  Down, Depressed, Hopeless 1 0 1 0 0  PHQ - 2 Score 1 0 1 0 0  Altered sleeping - - - 0 -  Tired, decreased energy - - - 0 -  Change in appetite - - - 0 -  Feeling bad or failure about yourself  - - - 0 -  Trouble concentrating - - - 0 -  Moving slowly or fidgety/restless - - - 0 -  Suicidal thoughts - - - 0 -  PHQ-9 Score - - - 0 -  Some recent data might be hidden    Flowsheet Row Video Visit from 07/19/2021 in [facility] Video Visit from 06/07/2021 in [facility] Video Visit from 01/11/2021 in [facility]  C-SSRS RISK CATEGORY No Risk No Risk No Risk      - increase Zoloft to 100mg  po qD to target mood and anxiety symptoms.   1. Impulse control disorder - lamoTRIgine (LAMICTAL) 150 MG tablet; TAKE 2 TABLETS(300 MG) BY MOUTH DAILY  Dispense: 180 tablet; Refill: 0  2. Bipolar II disorder (HCC) - lamoTRIgine (LAMICTAL) 150 MG tablet; TAKE 2 TABLETS(300 MG) BY MOUTH DAILY  Dispense: 180 tablet; Refill: 0  3. PMDD (premenstrual dysphoric disorder) - lamoTRIgine (LAMICTAL) 150 MG tablet; TAKE 2 TABLETS(300 MG) BY MOUTH DAILY  Dispense: 180 tablet; Refill: 0 - sertraline (ZOLOFT) 100 MG tablet; Take 1 tablet (100 mg total) by mouth daily.  Dispense: 90 tablet; Refill: 0  4. GAD (generalized anxiety disorder) - sertraline (ZOLOFT) 100 MG tablet; Take 1 tablet (100 mg total) by mouth daily.  Dispense: 90 tablet; Refill: 0   Follow Up  Instructions: In 1-2 months or sooner if needed   I discussed the assessment and treatment plan with the patient. The patient was provided an opportunity to ask questions and all were answered. The patient agreed with the plan and demonstrated an understanding of the instructions.   The patient was advised to call back or seek an in-person evaluation if the symptoms worsen or if the condition fails to improve as anticipated.  I provided 11 minutes of non-face-to-face time during this encounter.   Oletta Darter, MD

## 2021-07-24 ENCOUNTER — Telehealth: Payer: Self-pay

## 2021-07-24 NOTE — Telephone Encounter (Signed)
New message   Your information has been sent to [facility].  Enrica Gulbranson Key: [id number] - PA Case ID: [id number] Need help? Call us at [phone] Status Sent to [facility] Bernita Raisin 100MG  tablets Form [facility] Medicare Part D Electronic Prior Authorization Form (2017 [place]CPDP)

## 2021-08-01 MED ORDER — UBRELVY 100 MG PO TABS: 100.0000 mg | ORAL_TABLET | ORAL | 5 refills | Status: DC | PRN

## 2021-08-01 NOTE — Telephone Encounter (Signed)
PA Denied, quantity exceeds limits requirements.    30 for 30 day.  Script changed to 16 tables for 30 days

## 2021-08-06 ENCOUNTER — Other Ambulatory Visit (HOSPITAL_COMMUNITY): Payer: Self-pay | Admitting: Psychiatry

## 2021-08-06 DIAGNOSIS — F411 Generalized anxiety disorder: Secondary | ICD-10-CM

## 2021-08-06 DIAGNOSIS — F3281 Premenstrual dysphoric disorder: Secondary | ICD-10-CM

## 2021-08-15 ENCOUNTER — Encounter: Payer: Self-pay | Admitting: Internal Medicine

## 2021-08-15 MED ORDER — TRAMADOL HCL 50 MG PO TABS: 50.0000 mg | ORAL_TABLET | Freq: Four times a day (QID) | ORAL | 0 refills | Status: DC | PRN

## 2021-08-21 ENCOUNTER — Other Ambulatory Visit: Payer: Self-pay

## 2021-08-21 ENCOUNTER — Encounter: Payer: Self-pay | Admitting: Internal Medicine

## 2021-08-21 ENCOUNTER — Ambulatory Visit (INDEPENDENT_AMBULATORY_CARE_PROVIDER_SITE_OTHER): Payer: Medicare Other | Admitting: Obstetrics and Gynecology

## 2021-08-21 ENCOUNTER — Encounter: Payer: Self-pay | Admitting: Obstetrics and Gynecology

## 2021-08-21 ENCOUNTER — Other Ambulatory Visit: Payer: Self-pay | Admitting: Physician Assistant

## 2021-08-21 VITALS — BP 98/56 | HR 95 | Ht 62.75 in | Wt 105.0 lb

## 2021-08-21 DIAGNOSIS — R350 Frequency of micturition: Secondary | ICD-10-CM | POA: Diagnosis not present

## 2021-08-21 DIAGNOSIS — N898 Other specified noninflammatory disorders of vagina: Secondary | ICD-10-CM

## 2021-08-21 MED ORDER — TRAMADOL HCL 50 MG PO TABS: 50.0000 mg | ORAL_TABLET | Freq: Four times a day (QID) | ORAL | 0 refills | Status: DC | PRN

## 2021-08-21 NOTE — Progress Notes (Signed)
GYNECOLOGY  VISIT   HPI: 46 y.o.   Divorced  Caucasian  female   [id number] with Patient's last menstrual period was 08/07/2021 (approximate).   here for urinary urgency, frequency and low back discomfort.   Has Interstim placed by Dr. Junious Silk 10/18/15. Has not had follow up in a long time.  Feels the Interstim is not working as well as it did in past. Not taking medication for overactive bladder.  Has urinary incontinence.   States she has hx of UTIs and yeast infections.  Has white vaginal discharge.  Some itching.  Not using over the counter medication.  Allergy to Diflucan - rash and itching on her fingers.  Monistat causes swelling.  Drinks 2 Mt. Dew eqivalents per day. 1/2 cup coffee per day.   Not sexually active for years.   GYNECOLOGIC HISTORY: Patient's last menstrual period was 08/07/2021 (approximate). Contraception:  abstinence Menopausal hormone therapy:  none Last mammogram:  2022 normal w/[facility] Last pap smear:   09-19-17         OB History     Gravida  2   Para      Term      Preterm      AB  2   Living         SAB  2   IAB      Ectopic      Multiple      Live Births                 Patient Active Problem List   Diagnosis Date Noted   Incisional hernia, without obstruction or gangrene [id number]   Periumbilical abdominal pain 05/19/2021   Ulnar neuropathy 03/05/2021   Cellulitis of right leg 03/05/2021   Ulnar nerve entrapment at elbow 02/12/2021   Cellulitis of great toe, right 02/08/2021   Carpal tunnel syndrome of right wrist 11/24/2020   Gastroesophageal reflux disease 10/26/2020   Inflammatory arthritis 10/26/2020   Scoliosis 10/26/2020   Urinary frequency 09/27/2020   COVID 08/31/2020   Weight loss 02/21/2020   Encounter for orthopedic follow-up care 12/09/2019   Rheumatoid arthritis ([place]) 11/24/2019   Right wrist pain 07/29/2019   Right hand pain 07/29/2019   Intractable headache 04/14/2019   Hyperglycemia  02/18/2019   HLD (hyperlipidemia) 02/16/2018   Migraine 02/16/2018   Encounter for well adult exam with abnormal findings 05/21/2017   UTI (urinary tract infection) 05/21/2017   Thrush 05/21/2017   Vitamin D deficiency 05/21/2017   Osteopenia 05/21/2017   Dyspnea 05/21/2017   Hematuria 01/01/2017   Lump in female breast 01/01/2017   Fatigue 01/01/2017   Candida vaginitis 01/01/2017   Dysuria 12/19/2016   Gastroesophageal reflux disease with esophagitis 11/14/2016   Irritable bowel syndrome with constipation 11/14/2016   Severe episode of recurrent major depressive disorder, without psychotic features ([place]) 08/24/2015   Cough 11/15/2014   Major depressive disorder, recurrent episode, moderate ([place]) 07/14/2014   ADD (attention deficit hyperactivity disorder, inattentive type) 07/14/2014   Impulse control disorder 07/14/2014   Pain of upper abdomen 05/09/2014   Depression 03/08/2014   GAD (generalized anxiety disorder) 03/08/2014   Anorexia nervosa 11/17/2013   Impulse control disorder, unspecified 11/17/2013   Cannabis abuse 11/17/2013   Insomnia 11/17/2013    Past Medical History:  Diagnosis Date   Abdominal hernia    ADD (attention deficit disorder)    Allergy    Anorexia nervosa without bulimia    Chronic constipation    Drug abuse ([place])  GAD (generalized anxiety disorder)    GERD (gastroesophageal reflux disease)    History of gastric restrictive surgery    History of gastric ulcer    History of Helicobacter pylori infection    after 2003   HLD (hyperlipidemia) 02/16/2018   Impulse control disorder    Moderate major depression (HCC)    Osteopenia    Osteoporosis    Post concussion syndrome    fell 10/20/2015   SMAS (superior mesenteric artery syndrome) (HCC)    Ulnar neuropathy    Urge urinary incontinence     Past Surgical History:  Procedure Laterality Date   DILATION AND CURETTAGE OF UTERUS  2005   INTERSTIM IMPLANT PLACEMENT N/A 10/18/2015    Procedure: Barrie Lyme IMPLANT FIRST STAGE;  Surgeon: Festus Aloe, MD;  Location[facility];  Service: Urology;  Laterality: N/A;   INTERSTIM IMPLANT PLACEMENT N/A 10/18/2015   Procedure: Barrie Lyme IMPLANT SECOND STAGE;  Surgeon: Festus Aloe, MD;  Location: [facility];  Service: Urology;  Laterality: N/A;   LAPAROSCOPIC GASTRIC RESTRICTIVE DUODENAL PROCEDURE (DUODENAL SWITCH)  12/ 2003   ORIF RIGHT WRIST FX  2013   REMOVAL HARDWARE X2 in  2013--  No retained hardware    Current Outpatient Medications  Medication Sig Dispense Refill   Calcium-Iron-Vit D-Vit K 500-08-998-40 MG-UNT-MCG CHEW Chew 1 each by mouth daily.     dicyclomine (BENTYL) 10 MG capsule TAKE 1 CAPSULE BY MOUTH FOUR TIMES DAILY BEFORE MEALS AND AT BEDTIME     Erenumab-aooe (AIMOVIG) 140 MG/ML SOAJ Inject 140 mg into the skin every 30 (thirty) days. 1 mL 5   ergocalciferol (VITAMIN D2) 1.25 MG (50000 UT) capsule ergocalciferol (vitamin D2) 1,250 mcg (50,000 unit) capsule  TAKE 1 CAPSULE BY MOUTH EVERY 7 DAYS     ferrous gluconate (FERGON) 324 MG tablet Take 1 tablet (324 mg total) by mouth daily with breakfast. 90 tablet 3   ibuprofen (ADVIL) 600 MG tablet TAKE 1 TABLET(600 MG) BY MOUTH EVERY 6 HOURS AS NEEDED 60 tablet 0   lamoTRIgine (LAMICTAL) 150 MG tablet TAKE 2 TABLETS(300 MG) BY MOUTH DAILY 180 tablet 0   lubiprostone (AMITIZA) 8 MCG capsule TAKE 1 CAPSULE(8 MCG) BY MOUTH TWICE DAILY WITH A MEAL 60 capsule 6   naproxen (NAPROSYN) 500 MG tablet Take 1 tablet (500 mg total) by mouth 2 (two) times daily as needed. 20 tablet 3   omeprazole (PRILOSEC) 40 MG capsule Take 1 capsule (40 mg total) by mouth daily. 90 capsule 3   ondansetron (ZOFRAN) 4 MG tablet TAKE 1 TABLET(4 MG) BY MOUTH EVERY 8 HOURS AS NEEDED FOR NAUSEA OR VOMITING 30 tablet 2   PARoxetine (PAXIL) 10 MG tablet paroxetine 10 mg tablet     Pediatric Multivitamins-Iron (FLINTSTONES COMPLETE PO) Take 2 tablets by mouth daily.      promethazine (PHENERGAN) 25 MG tablet TAKE 1 TABLET(25 MG) BY MOUTH TWICE DAILY AS NEEDED (Patient taking differently: Take 25 mg by mouth daily.) 60 tablet 2   rizatriptan (MAXALT-MLT) 10 MG disintegrating tablet TAKE 1 TABLET BY MOUTH AS NEEDED FOR MIGRANE. MAY REPEAT IN 2 HOURS IF NEEDED 10 tablet 2   rosuvastatin (CRESTOR) 20 MG tablet TAKE 1 TABLET(20 MG) BY MOUTH DAILY 90 tablet 3   sertraline (ZOLOFT) 100 MG tablet Take 1 tablet (100 mg total) by mouth daily. 90 tablet 0   traMADol (ULTRAM) 50 MG tablet Take 1 tablet (50 mg total) by mouth every 6 (six) hours as needed. 30 tablet  0   Ubrogepant (UBRELVY) 100 MG TABS Take 100 mg by mouth as needed (Take 1 tablet at the earlist on set of a Migraine. Max 2 tablets in a 24 hour period). 16 tablet 5   No current facility-administered medications for this visit.     ALLERGIES: Bactrim [sulfamethoxazole-trimethoprim], Ciprofloxacin, Penicillins, Metoclopramide, Monistat [miconazole], Morphine and related, Benadryl [diphenhydramine hcl], Diflucan [fluconazole], and Diphenhydramine hcl  Family History  Problem Relation Age of Onset   Alcohol abuse Mother    Anxiety disorder Mother    Depression Mother        severe with hx of suicide attempt. treated with ECT   COPD Mother    Cancer Mother        Cervical   Stroke Mother    Anxiety disorder Father    Drug abuse Father    COPD Father    Alcohol abuse Maternal Uncle    Anxiety disorder Maternal Grandmother    Depression Maternal Grandmother    ADD / ADHD Brother    Breast cancer Paternal Grandmother 47   Stomach cancer Neg Hx    Colon cancer Neg Hx    Rectal cancer Neg Hx    Esophageal cancer Neg Hx     Social History   Socioeconomic History   Marital status: Divorced    Spouse name: Not on file   Number of children: 0   Years of education: 14   Highest education level: Not on file  Occupational History   Occupation: Disability  Tobacco Use   Smoking status: Former     Packs/day: 0.50    Years: 1.00    Pack years: 0.50    Types: Cigarettes    Quit date: 12/03/2016    Years since quitting: 4.7   Smokeless tobacco: Never   Tobacco comments:    quit smoking 8 months ago as of 10/15/18  Vaping Use   Vaping Use: Never used  Substance and Sexual Activity   Alcohol use: No   Drug use: No    Frequency: 7.0 times per week    Types: Marijuana    Comment: hx of and last used cocaine 2012. she was smoking marjiuana 7g/week. smoking thru out the day but quit 4 months ago   Sexual activity: Not Currently    Comment: 1st intercourse 46 yo-Fewer than 5 partners  Other Topics Concern   Not on file  Social History Narrative   Fun: Play piano; Build Lego   Denies abuse and feels safe at home. Living with friend in [place].    Right handed    Drink coffee 1 cup eveyday   Leave with 2 friends   1 story home, 16 steps   Social Determinants of Radio broadcast assistant Strain: Not on file  Food Insecurity: No Food Insecurity   Worried About Charity fundraiser in the Last Year: Never true   Arboriculturist in the Last Year: Never true  Transportation Needs: Not on file  Physical Activity: Not on file  Stress: Not on file  Social Connections: Not on file  Intimate Partner Violence: Not on file    Review of Systems  Genitourinary:  Positive for dysuria, frequency and urgency.  All other systems reviewed and are negative.  PHYSICAL EXAMINATION:    BP (!) 98/56    Pulse 95    Ht 5' 2.75" (1.594 m)    Wt 105 lb (47.6 kg)    LMP 08/07/2021 (Approximate)  SpO2 97%    BMI 18.75 kg/m     General appearance: alert, cooperative and appears stated age  Pelvic: External genitalia:  no lesions              Urethra:  normal appearing urethra with no masses, tenderness or lesions              Bartholins and Skenes: normal                 Vagina: normal appearing vagina with normal color and discharge, no lesions              Cervix: no lesions                 Bimanual Exam:  Uterus:  normal size, contour, position, consistency, mobility, non-tender              Adnexa: no mass, fullness, tenderness         Chaperone was present for exam:  Estill Bamberg, CMA  ASSESSMENT  Urinary frequency.  Vaginal discharge.  Interstim device in place.  PLAN  Urinalysis:  sg 1.015, pH 6.0, Adams WBC, NS RBC, NS bacteria, 0- 5 squams.  UC sent.  No Abx at this time.  Wet prep:  negative yeast, negative clue cells and odor, negative trichomonas.  Bladder irritants discussed.  I recommend she follow up with her urologist to evaluate her Interstim device.  Questions invited and answered.  An After Visit Summary was printed and given to the patient.  27 min  total time was spent for this patient encounter, including preparation, face-to-face counseling with the patient, coordination of care, and documentation of the encounter.

## 2021-08-22 ENCOUNTER — Encounter: Payer: Self-pay | Admitting: Obstetrics and Gynecology

## 2021-08-22 LAB — URINALYSIS, COMPLETE W/RFL CULTURE
Bacteria, UA: NONE SEEN /HPF
Bilirubin Urine: NEGATIVE
Glucose, UA: NEGATIVE
Hgb urine dipstick: NEGATIVE
Hyaline Cast: NONE SEEN /LPF
Ketones, ur: NEGATIVE
Leukocyte Esterase: NEGATIVE
Nitrites, Initial: NEGATIVE
Protein, ur: NEGATIVE
RBC / HPF: NONE SEEN /HPF (ref 0–2)
Specific Gravity, Urine: 1.015 (ref 1.001–1.035)
WBC, UA: NONE SEEN /HPF (ref 0–5)
pH: 6 (ref 5.0–8.0)

## 2021-08-22 LAB — URINE CULTURE
MICRO NUMBER:: 12880470
Result:: NO GROWTH
SPECIMEN QUALITY:: ADEQUATE

## 2021-08-22 LAB — NO CULTURE INDICATED

## 2021-08-22 LAB — WET PREP FOR TRICH, YEAST, CLUE

## 2021-08-24 ENCOUNTER — Encounter: Payer: Self-pay | Admitting: Internal Medicine

## 2021-08-24 DIAGNOSIS — R5383 Other fatigue: Secondary | ICD-10-CM

## 2021-08-25 ENCOUNTER — Encounter: Payer: Self-pay | Admitting: Internal Medicine

## 2021-08-28 ENCOUNTER — Encounter: Payer: Self-pay | Admitting: Internal Medicine

## 2021-08-28 ENCOUNTER — Other Ambulatory Visit (INDEPENDENT_AMBULATORY_CARE_PROVIDER_SITE_OTHER): Payer: Medicare Other

## 2021-08-28 DIAGNOSIS — R5383 Other fatigue: Secondary | ICD-10-CM

## 2021-08-28 LAB — URINALYSIS, ROUTINE W REFLEX MICROSCOPIC
Bilirubin Urine: NEGATIVE
Ketones, ur: NEGATIVE
Leukocytes,Ua: NEGATIVE
Nitrite: NEGATIVE
Specific Gravity, Urine: 1.025 (ref 1.000–1.030)
Total Protein, Urine: NEGATIVE
Urine Glucose: NEGATIVE
Urobilinogen, UA: 0.2 (ref 0.0–1.0)
pH: 6.5 (ref 5.0–8.0)

## 2021-08-28 LAB — CBC WITH DIFFERENTIAL/PLATELET
Basophils Absolute: 0.1 10*3/uL (ref 0.0–0.1)
Basophils Relative: 0.9 % (ref 0.0–3.0)
Eosinophils Absolute: 0.2 10*3/uL (ref 0.0–0.7)
Eosinophils Relative: 2.4 % (ref 0.0–5.0)
HCT: 37.6 % (ref 36.0–46.0)
Hemoglobin: 12.6 g/dL (ref 12.0–15.0)
Lymphocytes Relative: 35.9 % (ref 12.0–46.0)
Lymphs Abs: 2.4 10*3/uL (ref 0.7–4.0)
MCHC: 33.5 g/dL (ref 30.0–36.0)
MCV: 87.1 fl (ref 78.0–100.0)
Monocytes Absolute: 0.4 10*3/uL (ref 0.1–1.0)
Monocytes Relative: 6.1 % (ref 3.0–12.0)
Neutro Abs: 3.7 10*3/uL (ref 1.4–7.7)
Neutrophils Relative %: 54.7 % (ref 43.0–77.0)
Platelets: 315 10*3/uL (ref 150.0–400.0)
RBC: 4.31 Mil/uL (ref 3.87–5.11)
RDW: 13 % (ref 11.5–15.5)
WBC: 6.7 10*3/uL (ref 4.0–10.5)

## 2021-08-28 LAB — BASIC METABOLIC PANEL
BUN: 12 mg/dL (ref 6–23)
CO2: 30 mEq/L (ref 19–32)
Calcium: 9.6 mg/dL (ref 8.4–10.5)
Chloride: 103 mEq/L (ref 96–112)
Creatinine, Ser: 0.64 mg/dL (ref 0.40–1.20)
GFR: 108.49 mL/min (ref >60.00)
Glucose, Bld: 83 mg/dL (ref 70–99)
Potassium: 4.3 mEq/L (ref 3.5–5.1)
Sodium: 138 mEq/L (ref 135–145)

## 2021-08-28 LAB — HEPATIC FUNCTION PANEL
ALT: 19 U/L (ref 0–35)
AST: 21 U/L (ref 0–37)
Albumin: 4.4 g/dL (ref 3.5–5.2)
Alkaline Phosphatase: 70 U/L (ref 39–117)
Bilirubin, Direct: 0.1 mg/dL (ref 0.0–0.3)
Total Bilirubin: 0.5 mg/dL (ref 0.2–1.2)
Total Protein: 7.4 g/dL (ref 6.0–8.3)

## 2021-08-28 LAB — TSH: TSH: 1.94 u[IU]/mL (ref 0.35–5.50)

## 2021-08-29 ENCOUNTER — Encounter: Payer: Self-pay | Admitting: Internal Medicine

## 2021-08-29 ENCOUNTER — Other Ambulatory Visit: Payer: Self-pay

## 2021-08-29 ENCOUNTER — Ambulatory Visit (INDEPENDENT_AMBULATORY_CARE_PROVIDER_SITE_OTHER): Payer: Medicare Other | Admitting: Internal Medicine

## 2021-08-29 VITALS — BP 108/74 | HR 90 | Ht 62.75 in | Wt 103.0 lb

## 2021-08-29 DIAGNOSIS — J342 Deviated nasal septum: Secondary | ICD-10-CM

## 2021-08-29 DIAGNOSIS — E538 Deficiency of other specified B group vitamins: Secondary | ICD-10-CM

## 2021-08-29 DIAGNOSIS — E7800 Pure hypercholesterolemia, unspecified: Secondary | ICD-10-CM | POA: Diagnosis not present

## 2021-08-29 DIAGNOSIS — Z0001 Encounter for general adult medical examination with abnormal findings: Secondary | ICD-10-CM

## 2021-08-29 DIAGNOSIS — G4710 Hypersomnia, unspecified: Secondary | ICD-10-CM

## 2021-08-29 DIAGNOSIS — E559 Vitamin D deficiency, unspecified: Secondary | ICD-10-CM

## 2021-08-29 DIAGNOSIS — F339 Major depressive disorder, recurrent, unspecified: Secondary | ICD-10-CM

## 2021-08-29 DIAGNOSIS — R739 Hyperglycemia, unspecified: Secondary | ICD-10-CM | POA: Diagnosis not present

## 2021-08-29 NOTE — Progress Notes (Deleted)
Patient ID: Karen Hahn, female   DOB: 1976-04-29, 46 y.o.   MRN: [id number]        Chief Complaint: follow up HTN, HLD and hyperglycemia ***       HPI:  Karen Hahn is a 46 y.o. female here with c/o        Wt Readings from Last 3 Encounters:  08/29/21 103 lb (46.7 kg)  08/21/21 105 lb (47.6 kg)  05/16/21 106 lb (48.1 kg)   BP Readings from Last 3 Encounters:  08/29/21 108/74  08/21/21 (!) 98/56  05/16/21 104/60         Past Medical History:  Diagnosis Date   Abdominal hernia    ADD (attention deficit disorder)    Allergy    Anorexia nervosa without bulimia    Chronic constipation    Drug abuse (HCC)    GAD (generalized anxiety disorder)    GERD (gastroesophageal reflux disease)    History of gastric restrictive surgery    History of gastric ulcer    History of Helicobacter pylori infection    after 2003   HLD (hyperlipidemia) 02/16/2018   Impulse control disorder    Moderate major depression (HCC)    Osteopenia    Osteoporosis    Post concussion syndrome    fell 10/20/2015   SMAS (superior mesenteric artery syndrome) (HCC)    Ulnar neuropathy    Urge urinary incontinence    Past Surgical History:  Procedure Laterality Date   DILATION AND CURETTAGE OF UTERUS  2005   INTERSTIM IMPLANT PLACEMENT N/A 10/18/2015   Procedure: [name] IMPLANT FIRST STAGE;  Surgeon: Jerilee Field, MD;  Location: [facility];  Service: Urology;  Laterality: N/A;   INTERSTIM IMPLANT PLACEMENT N/A 10/18/2015   Procedure: [name] IMPLANT SECOND STAGE;  Surgeon: Jerilee Field, MD;  Location: [facility];  Service: Urology;  Laterality: N/A;   LAPAROSCOPIC GASTRIC RESTRICTIVE DUODENAL PROCEDURE (DUODENAL SWITCH)  12/ 2003   ORIF RIGHT WRIST FX  2013   REMOVAL HARDWARE X2 in  2013--  No retained hardware    reports that she quit smoking about 4 years ago. Her smoking use included cigarettes. She has a 0.50 pack-year smoking history. She has never  used smokeless tobacco. She reports that she does not drink alcohol and does not use drugs. family history includes ADD / ADHD in her brother; Alcohol abuse in her maternal uncle and mother; Anxiety disorder in her father, maternal grandmother, and mother; Breast cancer (age of onset: 64) in her paternal grandmother; COPD in her father and mother; Cancer in her mother; Depression in her maternal grandmother and mother; Drug abuse in her father; Stroke in her mother. Allergies  Allergen Reactions   Bactrim [Sulfamethoxazole-Trimethoprim] Other (See Comments)    Yeast infection   Ciprofloxacin Itching   Penicillins Other (See Comments)    Has patient had a PCN reaction causing immediate rash, facial/tongue/throat swelling, SOB or lightheadedness with hypotension: NO (UTI, YEAST INFECTION) Has patient had a PCN reaction causing severe rash involving mucus membranes or skin necrosis: NO Has patient had a PCN reaction that required hospitalization NO Has patient had a PCN reaction occurring within the last 10 years: NO If all of the above answers are "NO", then may proceed with Cephalosporin use.    Metoclopramide Other (See Comments)   Monistat [Miconazole] Swelling   Morphine And Related Itching and Other (See Comments)    "whole body feels tight feeling"   Benadryl [Diphenhydramine Hcl]  Palpitations   Diflucan [Fluconazole] Rash   Diphenhydramine Hcl Palpitations   Current Outpatient Medications on File Prior to Visit  Medication Sig Dispense Refill   Calcium-Iron-Vit D-Vit K 500-08-998-40 MG-UNT-MCG CHEW Chew 1 each by mouth daily.     dicyclomine (BENTYL) 10 MG capsule TAKE 1 CAPSULE BY MOUTH FOUR TIMES DAILY BEFORE MEALS AND AT BEDTIME     Erenumab-aooe (AIMOVIG) 140 MG/ML SOAJ Inject 140 mg into the skin every 30 (thirty) days. 1 mL 5   ergocalciferol (VITAMIN D2) 1.25 MG (50000 UT) capsule ergocalciferol (vitamin D2) 1,250 mcg (50,000 unit) capsule  TAKE 1 CAPSULE BY MOUTH EVERY 7 DAYS      ferrous gluconate (FERGON) 324 MG tablet Take 1 tablet (324 mg total) by mouth daily with breakfast. 90 tablet 3   ibuprofen (ADVIL) 600 MG tablet TAKE 1 TABLET(600 MG) BY MOUTH EVERY 6 HOURS AS NEEDED 60 tablet 0   lamoTRIgine (LAMICTAL) 150 MG tablet TAKE 2 TABLETS(300 MG) BY MOUTH DAILY 180 tablet 0   lubiprostone (AMITIZA) 8 MCG capsule TAKE 1 CAPSULE(8 MCG) BY MOUTH TWICE DAILY WITH A MEAL 60 capsule 6   naproxen (NAPROSYN) 500 MG tablet Take 1 tablet (500 mg total) by mouth 2 (two) times daily as needed. 20 tablet 3   omeprazole (PRILOSEC) 40 MG capsule Take 1 capsule (40 mg total) by mouth daily. 90 capsule 3   ondansetron (ZOFRAN) 4 MG tablet TAKE 1 TABLET(4 MG) BY MOUTH EVERY 8 HOURS AS NEEDED FOR NAUSEA OR VOMITING 30 tablet 2   PARoxetine (PAXIL) 10 MG tablet paroxetine 10 mg tablet     Pediatric Multivitamins-Iron (FLINTSTONES COMPLETE PO) Take 2 tablets by mouth daily.     promethazine (PHENERGAN) 25 MG tablet TAKE 1 TABLET(25 MG) BY MOUTH TWICE DAILY AS NEEDED (Patient taking differently: Take 25 mg by mouth daily.) 60 tablet 2   rizatriptan (MAXALT-MLT) 10 MG disintegrating tablet TAKE 1 TABLET BY MOUTH AS NEEDED FOR MIGRANE. MAY REPEAT IN 2 HOURS IF NEEDED 10 tablet 2   rosuvastatin (CRESTOR) 20 MG tablet TAKE 1 TABLET(20 MG) BY MOUTH DAILY 90 tablet 3   sertraline (ZOLOFT) 100 MG tablet Take 1 tablet (100 mg total) by mouth daily. 90 tablet 0   traMADol (ULTRAM) 50 MG tablet Take 1 tablet (50 mg total) by mouth every 6 (six) hours as needed. 30 tablet 0   Ubrogepant (UBRELVY) 100 MG TABS Take 100 mg by mouth as needed (Take 1 tablet at the earlist on set of a Migraine. Max 2 tablets in a 24 hour period). 16 tablet 5   No current facility-administered medications on file prior to visit.        ROS:  All others reviewed and negative.  Objective        PE:  BP 108/74 (BP Location: Right Arm, Patient Position: Sitting, Cuff Size: Normal)    Pulse 90    Ht 5' 2.75" (1.594 m)     Wt 103 lb (46.7 kg)    LMP 08/07/2021 (Approximate)    SpO2 98%    BMI 18.39 kg/m                 Constitutional: Pt appears in NAD               HENT: Head: NCAT.                Right Ear: External ear normal.  Left Ear: External ear normal.                Eyes: . Pupils are equal, round, and reactive to light. Conjunctivae and EOM are normal               Nose: without d/c or deformity               Neck: Neck supple. Gross normal ROM               Cardiovascular: Normal rate and regular rhythm.                 Pulmonary/Chest: Effort normal and breath sounds without rales or wheezing.                Abd:  Soft, NT, ND, + BS, no organomegaly               Neurological: Pt is alert. At baseline orientation, motor grossly intact               Skin: Skin is warm. No rashes, no other new lesions, LE edema - ***               Psychiatric: Pt behavior is normal without agitation   Micro: none  Cardiac tracings I have personally interpreted today:  none  Pertinent Radiological findings (summarize): none   Lab Results  Component Value Date   WBC 6.7 08/28/2021   HGB 12.6 08/28/2021   HCT 37.6 08/28/2021   PLT 315.0 08/28/2021   GLUCOSE 83 08/28/2021   CHOL 183 03/05/2021   TRIG 94.0 03/05/2021   HDL 53.70 03/05/2021   LDLCALC 111 (H) 03/05/2021   ALT 19 08/28/2021   AST 21 08/28/2021   NA 138 08/28/2021   K 4.3 08/28/2021   CL 103 08/28/2021   CREATININE 0.64 08/28/2021   BUN 12 08/28/2021   CO2 30 08/28/2021   TSH 1.94 08/28/2021   HGBA1C 5.2 03/05/2021   Assessment/Plan:  Dejanique A Engert is a 46 y.o. White or Caucasian [1] female with  has a past medical history of Abdominal hernia, ADD (attention deficit disorder), Allergy, Anorexia nervosa without bulimia, Chronic constipation, Drug abuse (HCC), GAD (generalized anxiety disorder), GERD (gastroesophageal reflux disease), History of gastric restrictive surgery, History of gastric ulcer, History of  Helicobacter pylori infection, HLD (hyperlipidemia) (02/16/2018), Impulse control disorder, Moderate major depression (HCC), Osteopenia, Osteoporosis, Post concussion syndrome, SMAS (superior mesenteric artery syndrome) (HCC), Ulnar neuropathy, and Urge urinary incontinence.  No problem-specific Assessment & Plan notes found for this encounter.  Followup: No follow-ups on file.  Oliver Barre, MD 08/29/2021 3:33 PM  [facility] - [facility]

## 2021-08-29 NOTE — Patient Instructions (Addendum)
Please continue all other medications as before, and refills have been done if requested.  Please have the pharmacy call with any other refills you may need.  Please continue your efforts at being more active, low cholesterol diet, and weight control.  Please keep your appointments with your specialists as you may have planned  You will be contacted regarding the referral for: ENT and pulmonary  OK to CANCEL the Feb 1 appointment with me here, since you were seen today  Please make an Appointment to return in 6 months, or sooner if needed, also with Lab Appointment for testing done 3-5 days before at the FIRST FLOOR Lab (so this is for TWO appointments - please see the scheduling desk as you leave)  Due to the ongoing Covid 19 pandemic, our lab now requires an appointment for any labs done at our office.  If you need labs done and do not have an appointment, please call our office ahead of time to schedule before presenting to the lab for your testing.

## 2021-09-01 NOTE — Progress Notes (Signed)
Patient ID: Karen Hahn, female [name], 46 y.o.   MRN: [id number]         Chief Complaint:: wellness exam and Fatigue With hypersomnolence, deviated nasal septum, low vit d       HPI:  Karen Hahn is a 46 y.o. female here for wellness exam; plans to call for pap smear soon, declines tdap, o/w up to date                        Also Pt denies chest pain, increased sob or doe, wheezing, orthopnea, PND, increased LE swelling, palpitations, dizziness or syncope.   Pt denies polydipsia, polyuria, or new focal neuro s/s. But has worsening generalized fatigue x 2 mo with hypersomnolence and known deviated nasal septum.  Not sure about snoring or altered breathing as she sleeps alone.   Pt denies fever, wt loss, night sweats, loss of appetite, or other constitutional symptoms  Denies worsening depressive symptoms, suicidal ideation, or panic.     Wt Readings from Last 3 Encounters:  08/29/21 103 lb (46.7 kg)  08/21/21 105 lb (47.6 kg)  05/16/21 106 lb (48.1 kg)   BP Readings from Last 3 Encounters:  08/29/21 108/74  08/21/21 (!) 98/56  05/16/21 104/60   Immunization History  Administered Date(s) Administered   Influenza,inj,Quad PF,6+ Mos 06/12/2017, 05/04/2018   Influenza-Unspecified 06/02/2017   Pneumococcal Polysaccharide-23 03/05/2021   Tdap 05/21/2017  There are no preventive care reminders to display for this patient.    Past Medical History:  Diagnosis Date   Abdominal hernia    ADD (attention deficit disorder)    Allergy    Anorexia nervosa without bulimia    Chronic constipation    Drug abuse (HCC)    GAD (generalized anxiety disorder)    GERD (gastroesophageal reflux disease)    History of gastric restrictive surgery    History of gastric ulcer    History of Helicobacter pylori infection    after 2003   HLD (hyperlipidemia) 02/16/2018   Impulse control disorder    Moderate major depression (HCC)    Osteopenia    Osteoporosis    Post concussion  syndrome    fell 10/20/2015   SMAS (superior mesenteric artery syndrome) (HCC)    Ulnar neuropathy    Urge urinary incontinence    Past Surgical History:  Procedure Laterality Date   DILATION AND CURETTAGE OF UTERUS  2005   INTERSTIM IMPLANT PLACEMENT N/A 10/18/2015   Procedure: [name] IMPLANT FIRST STAGE;  Surgeon: Jerilee Field, MD;  Location: [facility];  Service: Urology;  Laterality: N/A;   INTERSTIM IMPLANT PLACEMENT N/A 10/18/2015   Procedure: [name] IMPLANT SECOND STAGE;  Surgeon: Jerilee Field, MD;  Location: [facility];  Service: Urology;  Laterality: N/A;   LAPAROSCOPIC GASTRIC RESTRICTIVE DUODENAL PROCEDURE (DUODENAL SWITCH)  12/ 2003   ORIF RIGHT WRIST FX  2013   REMOVAL HARDWARE X2 in  2013--  No retained hardware    reports that she quit smoking about 4 years ago. Her smoking use included cigarettes. She has a 0.50 pack-year smoking history. She has never used smokeless tobacco. She reports that she does not drink alcohol and does not use drugs. family history includes ADD / ADHD in her brother; Alcohol abuse in her maternal uncle and mother; Anxiety disorder in her father, maternal grandmother, and mother; Breast cancer (age of onset: 52) in her paternal grandmother; COPD in her father and mother;  Cancer in her mother; Depression in her maternal grandmother and mother; Drug abuse in her father; Stroke in her mother. Allergies  Allergen Reactions   Bactrim [Sulfamethoxazole-Trimethoprim] Other (See Comments)    Yeast infection   Ciprofloxacin Itching   Penicillins Other (See Comments)    Has patient had a PCN reaction causing immediate rash, facial/tongue/throat swelling, SOB or lightheadedness with hypotension: NO (UTI, YEAST INFECTION) Has patient had a PCN reaction causing severe rash involving mucus membranes or skin necrosis: NO Has patient had a PCN reaction that required hospitalization NO Has patient had a PCN reaction  occurring within the last 10 years: NO If all of the above answers are "NO", then may proceed with Cephalosporin use.    Metoclopramide Other (See Comments)   Monistat [Miconazole] Swelling   Morphine And Related Itching and Other (See Comments)    "whole body feels tight feeling"   Benadryl [Diphenhydramine Hcl] Palpitations   Diflucan [Fluconazole] Rash   Diphenhydramine Hcl Palpitations   Current Outpatient Medications on File Prior to Visit  Medication Sig Dispense Refill   Calcium-Iron-Vit D-Vit K [id number] MG-UNT-MCG CHEW Chew 1 each by mouth daily.     dicyclomine (BENTYL) 10 MG capsule TAKE 1 CAPSULE BY MOUTH FOUR TIMES DAILY BEFORE MEALS AND AT BEDTIME     Erenumab-aooe (AIMOVIG) 140 MG/ML SOAJ Inject 140 mg into the skin every 30 (thirty) days. 1 mL 5   ergocalciferol (VITAMIN D2) 1.25 MG (50000 UT) capsule ergocalciferol (vitamin D2) 1,250 mcg (50,000 unit) capsule  TAKE 1 CAPSULE BY MOUTH EVERY 7 DAYS     ferrous gluconate (FERGON) 324 MG tablet Take 1 tablet (324 mg total) by mouth daily with breakfast. 90 tablet 3   ibuprofen (ADVIL) 600 MG tablet TAKE 1 TABLET(600 MG) BY MOUTH EVERY 6 HOURS AS NEEDED 60 tablet 0   lamoTRIgine (LAMICTAL) 150 MG tablet TAKE 2 TABLETS(300 MG) BY MOUTH DAILY 180 tablet 0   lubiprostone (AMITIZA) 8 MCG capsule TAKE 1 CAPSULE(8 MCG) BY MOUTH TWICE DAILY WITH A MEAL 60 capsule 6   naproxen (NAPROSYN) 500 MG tablet Take 1 tablet (500 mg total) by mouth 2 (two) times daily as needed. 20 tablet 3   omeprazole (PRILOSEC) 40 MG capsule Take 1 capsule (40 mg total) by mouth daily. 90 capsule 3   ondansetron (ZOFRAN) 4 MG tablet TAKE 1 TABLET(4 MG) BY MOUTH EVERY 8 HOURS AS NEEDED FOR NAUSEA OR VOMITING 30 tablet 2   PARoxetine (PAXIL) 10 MG tablet paroxetine 10 mg tablet     Pediatric Multivitamins-Iron (FLINTSTONES COMPLETE PO) Take 2 tablets by mouth daily.     promethazine (PHENERGAN) 25 MG tablet TAKE 1 TABLET(25 MG) BY MOUTH TWICE DAILY AS  NEEDED (Patient taking differently: Take 25 mg by mouth daily.) 60 tablet 2   rizatriptan (MAXALT-MLT) 10 MG disintegrating tablet TAKE 1 TABLET BY MOUTH AS NEEDED FOR MIGRANE. MAY REPEAT IN 2 HOURS IF NEEDED 10 tablet 2   rosuvastatin (CRESTOR) 20 MG tablet TAKE 1 TABLET(20 MG) BY MOUTH DAILY 90 tablet 3   sertraline (ZOLOFT) 100 MG tablet Take 1 tablet (100 mg total) by mouth daily. 90 tablet 0   traMADol (ULTRAM) 50 MG tablet Take 1 tablet (50 mg total) by mouth every 6 (six) hours as needed. 30 tablet 0   Ubrogepant (UBRELVY) 100 MG TABS Take 100 mg by mouth as needed (Take 1 tablet at the earlist on set of a Migraine. Max 2 tablets in a 24 hour period). 16 tablet  5   No current facility-administered medications on file prior to visit.        ROS:  All others reviewed and negative.  Objective        PE:  BP 108/74 (BP Location: Right Arm, Patient Position: Sitting, Cuff Size: Normal)    Pulse 90    Ht 5' 2.75" (1.594 m)    Wt 103 lb (46.7 kg)    LMP 08/07/2021 (Approximate)    SpO2 98%    BMI 18.39 kg/m                 Constitutional: Pt appears in NAD               HENT: Head: NCAT.                Right Ear: External ear normal.                 Left Ear: External ear normal.                Eyes: . Pupils are equal, round, and reactive to light. Conjunctivae and EOM are normal               Nose: without d/c or deformity               Neck: Neck supple. Gross normal ROM               Cardiovascular: Normal rate and regular rhythm.                 Pulmonary/Chest: Effort normal and breath sounds without rales or wheezing.                Abd:  Soft, NT, ND, + BS, no organomegaly               Neurological: Pt is alert. At baseline orientation, motor grossly intact               Skin: Skin is warm. No rashes, no other new lesions, LE edema - none               Psychiatric: Pt behavior is normal without agitation   Micro: none  Cardiac tracings I have personally interpreted today:   none  Pertinent Radiological findings (summarize): none   Lab Results  Component Value Date   WBC 6.7 08/28/2021   HGB 12.6 08/28/2021   HCT 37.6 08/28/2021   PLT 315.0 08/28/2021   GLUCOSE 83 08/28/2021   CHOL 183 03/05/2021   TRIG 94.0 03/05/2021   HDL 53.70 03/05/2021   LDLCALC 111 (H) 03/05/2021   ALT 19 08/28/2021   AST 21 08/28/2021   NA 138 08/28/2021   K 4.3 08/28/2021   CL 103 08/28/2021   CREATININE 0.64 08/28/2021   BUN 12 08/28/2021   CO2 30 08/28/2021   TSH 1.94 08/28/2021   HGBA1C 5.2 03/05/2021   Assessment/Plan:  Karen Hahn is a 46 y.o. White or Caucasian [1] female with  has a past medical history of Abdominal hernia, ADD (attention deficit disorder), Allergy, Anorexia nervosa without bulimia, Chronic constipation, Drug abuse (HCC), GAD (generalized anxiety disorder), GERD (gastroesophageal reflux disease), History of gastric restrictive surgery, History of gastric ulcer, History of Helicobacter pylori infection, HLD (hyperlipidemia) (02/16/2018), Impulse control disorder, Moderate major depression (HCC), Osteopenia, Osteoporosis, Post concussion syndrome, SMAS (superior mesenteric artery syndrome) (HCC), Ulnar neuropathy, and Urge urinary incontinence.  Encounter for well adult exam with abnormal  findings Age and sex appropriate education and counseling updated with regular exercise and diet Referrals for preventative services - pt to call for pap herself Immunizations addressed - declines tdap Smoking counseling  - none needed Evidence for depression or other mood disorder - chronic anxiety, depression stable recently Most recent labs reviewed. I have personally reviewed and have noted: 1) the patient's medical and social history 2) The patient's current medications and supplements 3) The patient's height, weight, and BMI have been recorded in the chart   Depression Stable overall, cont to f/u psychiatry, cont same tx  Deviated nasal  septum Ongoing known issue cant r/o worsening and possible OSA related - for ENT referral  HLD (hyperlipidemia) Lab Results  Component Value Date   LDLCALC 111 (H) 03/05/2021   Uncontrolled, goal ldl < 100, pt to continue current statin crestor 20 as declines increase for now, for lower chol diet   Hyperglycemia Lab Results  Component Value Date   HGBA1C 5.2 03/05/2021   Stable, pt to continue current medical treatment  - diet   Vitamin D deficiency Last vitamin D Lab Results  Component Value Date   VD25OH 23.80 (L) 03/05/2021   Low, to start oral replacement   Hypersomnolence For pulm referral as well, r/o osa  Followup: Return in about 6 months (around 02/26/2022).  Oliver Barre, MD 09/01/2021 8:49 PM [facility]

## 2021-09-01 NOTE — Assessment & Plan Note (Signed)
Stable overall, cont to f/u psychiatry, cont same tx

## 2021-09-01 NOTE — Assessment & Plan Note (Signed)
Last vitamin D Lab Results  Component Value Date   VD25OH 23.80 (L) 03/05/2021   Low, to start oral replacement

## 2021-09-01 NOTE — Assessment & Plan Note (Signed)
Age and sex appropriate education and counseling updated with regular exercise and diet Referrals for preventative services - pt to call for pap herself Immunizations addressed - declines tdap Smoking counseling  - none needed Evidence for depression or other mood disorder - chronic anxiety, depression stable recently Most recent labs reviewed. I have personally reviewed and have noted: 1) the patient's medical and social history 2) The patient's current medications and supplements 3) The patient's height, weight, and BMI have been recorded in the chart

## 2021-09-01 NOTE — Assessment & Plan Note (Signed)
Lab Results  Component Value Date   HGBA1C 5.2 03/05/2021   Stable, pt to continue current medical treatment  - diet  

## 2021-09-01 NOTE — Assessment & Plan Note (Signed)
Lab Results  Component Value Date   LDLCALC 111 (H) 03/05/2021   Uncontrolled, goal ldl < 100, pt to continue current statin crestor 20 as declines increase for now, for lower chol diet

## 2021-09-01 NOTE — Assessment & Plan Note (Signed)
For pulm referral as well, r/o osa

## 2021-09-01 NOTE — Assessment & Plan Note (Signed)
Ongoing known issue cant r/o worsening and possible OSA related - for ENT referral

## 2021-09-05 ENCOUNTER — Ambulatory Visit: Payer: Medicare Other | Admitting: Internal Medicine

## 2021-09-05 NOTE — Progress Notes (Signed)
Breast US report reviewed.  No evidence of malignancy.   Ok to remove from mammogram hold and return to routine screening.

## 2021-09-07 ENCOUNTER — Institutional Professional Consult (permissible substitution): Payer: Medicare Other | Admitting: Pulmonary Disease

## 2021-09-07 ENCOUNTER — Other Ambulatory Visit: Payer: Self-pay | Admitting: Internal Medicine

## 2021-09-17 ENCOUNTER — Other Ambulatory Visit: Payer: Self-pay | Admitting: Internal Medicine

## 2021-09-17 ENCOUNTER — Encounter: Payer: Self-pay | Admitting: Internal Medicine

## 2021-09-27 ENCOUNTER — Other Ambulatory Visit: Payer: Self-pay | Admitting: Physician Assistant

## 2021-09-27 ENCOUNTER — Other Ambulatory Visit: Payer: Self-pay

## 2021-09-27 ENCOUNTER — Telehealth (HOSPITAL_BASED_OUTPATIENT_CLINIC_OR_DEPARTMENT_OTHER): Payer: Medicare Other | Admitting: Psychiatry

## 2021-09-27 DIAGNOSIS — F639 Impulse disorder, unspecified: Secondary | ICD-10-CM

## 2021-09-27 DIAGNOSIS — G4700 Insomnia, unspecified: Secondary | ICD-10-CM

## 2021-09-27 DIAGNOSIS — F411 Generalized anxiety disorder: Secondary | ICD-10-CM

## 2021-09-27 DIAGNOSIS — F3181 Bipolar II disorder: Secondary | ICD-10-CM

## 2021-09-27 DIAGNOSIS — F3281 Premenstrual dysphoric disorder: Secondary | ICD-10-CM

## 2021-09-27 DIAGNOSIS — F900 Attention-deficit hyperactivity disorder, predominantly inattentive type: Secondary | ICD-10-CM

## 2021-09-27 DIAGNOSIS — F5000 Anorexia nervosa, unspecified: Secondary | ICD-10-CM

## 2021-09-27 MED ORDER — ESCITALOPRAM OXALATE 10 MG PO TABS: 10.0000 mg | ORAL_TABLET | Freq: Every day | ORAL | 0 refills | Status: DC

## 2021-09-27 MED ORDER — LAMOTRIGINE 150 MG PO TABS: ORAL_TABLET | ORAL | 0 refills | Status: DC

## 2021-09-27 NOTE — Progress Notes (Signed)
Virtual Visit via Video Note  I connected with Karen Hahn on 09/27/21 at  3:30 PM EST by a video enabled telemedicine application and verified that I am speaking with the correct person using two identifiers.  Location: Patient: in parked car Provider: office   I discussed the limitations of evaluation and management by telemedicine and the availability of in person appointments. The patient expressed understanding and agreed to proceed.  History of Present Illness: Karen Hahn has been doing well. She remains busy caring for her nephew. She denies depression and anhedonia. Karen Hahn continues to experience anxiety and the Zoloft is not helping at all. The anxiety is manageable but she struggles with it daily. The anxiety can get overwhelming when situations happen. She denies panic attacks. Her irritability has been stable. She is sleeping and eating well. Pt denies recent manic and hypomanic symptoms including periods of decreased need for sleep, increased energy, mood lability, impulsivity, FOI, and excessive spending. She denies SI/HI.  Around her menstrual cycle she continues to have severe mood issues.     Observations/Objective: Psychiatric Specialty Exam: ROS  There were no vitals taken for this visit.There is no height or weight on file to calculate BMI.  General Appearance: Casual and Fairly Groomed  Eye Contact:  Good  Speech:  Clear and Coherent and Normal Rate  Volume:  Normal  Mood:  Anxious  Affect:  Full Range  Thought Process:  Goal Directed, Linear, and Descriptions of Associations: Intact  Orientation:  Full (Time, Place, and Person)  Thought Content:  Logical  Suicidal Thoughts:  No  Homicidal Thoughts:  No  Memory:  Immediate;   Good  Judgement:  Good  Insight:  Good  Psychomotor Activity:  Normal  Concentration:  Concentration: Good  Recall:  Good  Fund of Knowledge:  Good  Language:  Good  Akathisia:  No  Handed:  Right  AIMS (if indicated):     Assets:   Communication Skills Desire for Improvement Financial Resources/Insurance Housing Resilience Social Support Talents/Skills Transportation Vocational/Educational  ADL's:  Intact  Cognition:  WNL  Sleep:        Assessment and Plan:  Depression screen [facility] 2/9 09/27/2021 07/19/2021 06/07/2021 01/11/2021 09/27/2020  Decreased Interest 0 0 0 0 0  Down, Depressed, Hopeless 0 1 0 1 0  PHQ - 2 Score 0 1 0 1 0  Altered sleeping - - - - 0  Tired, decreased energy - - - - 0  Change in appetite - - - - 0  Feeling bad or failure about yourself  - - - - 0  Trouble concentrating - - - - 0  Moving slowly or fidgety/restless - - - - 0  Suicidal thoughts - - - - 0  PHQ-9 Score - - - - 0  Some recent data might be hidden    Flowsheet Row Video Visit from 09/27/2021 in [facility] Video Visit from 07/19/2021 in [facility] Video Visit from 06/07/2021 in [facility]  C-SSRS RISK CATEGORY No Risk No Risk No Risk      Failed trials- Paxil, Prozac, Remeron, Zoloft  D/c Zoloft   Start trial of Lexapro 10mg  qD for PMDD  1. GAD (generalized anxiety disorder) - escitalopram (LEXAPRO) 10 MG tablet; Take 1 tablet (10 mg total) by mouth daily.  Dispense: 30 tablet; Refill: 0  2. PMDD (premenstrual dysphoric disorder) - lamoTRIgine (LAMICTAL) 150 MG tablet; TAKE 2 TABLETS(300 MG) BY MOUTH DAILY  Dispense: 180 tablet; Refill: 0 - escitalopram (LEXAPRO) 10 MG tablet; Take 1 tablet (10 mg total) by mouth daily.  Dispense: 30 tablet; Refill: 0  3. Bipolar II disorder (HCC) - lamoTRIgine (LAMICTAL) 150 MG tablet; TAKE 2 TABLETS(300 MG) BY MOUTH DAILY  Dispense: 180 tablet; Refill: 0  4. Impulse control disorder - lamoTRIgine (LAMICTAL) 150 MG tablet; TAKE 2 TABLETS(300 MG) BY MOUTH DAILY  Dispense: 180 tablet; Refill: 0  5. Insomnia, unspecified type  6. Attention deficit hyperactivity  disorder (ADHD), predominantly inattentive type  7. Anorexia nervosa   Follow Up Instructions: In 1-2 weeks or sooner if needed   I discussed the assessment and treatment plan with the patient. The patient was provided an opportunity to ask questions and all were answered. The patient agreed with the plan and demonstrated an understanding of the instructions.   The patient was advised to call back or seek an in-person evaluation if the symptoms worsen or if the condition fails to improve as anticipated.  I provided 14 minutes of non-face-to-face time during this encounter.   Oletta DarterSalina Nijee Heatwole, MD

## 2021-10-11 ENCOUNTER — Other Ambulatory Visit: Payer: Self-pay

## 2021-10-11 ENCOUNTER — Other Ambulatory Visit (HOSPITAL_COMMUNITY): Payer: Self-pay | Admitting: Psychiatry

## 2021-10-11 ENCOUNTER — Telehealth (HOSPITAL_BASED_OUTPATIENT_CLINIC_OR_DEPARTMENT_OTHER): Payer: Medicare Other | Admitting: Psychiatry

## 2021-10-11 ENCOUNTER — Other Ambulatory Visit: Payer: Self-pay | Admitting: Internal Medicine

## 2021-10-11 DIAGNOSIS — F3281 Premenstrual dysphoric disorder: Secondary | ICD-10-CM

## 2021-10-11 DIAGNOSIS — F3181 Bipolar II disorder: Secondary | ICD-10-CM

## 2021-10-11 DIAGNOSIS — F639 Impulse disorder, unspecified: Secondary | ICD-10-CM

## 2021-10-11 MED ORDER — DIVALPROEX SODIUM ER 250 MG PO TB24: 250.0000 mg | ORAL_TABLET | Freq: Every day | ORAL | 0 refills | Status: DC

## 2021-10-11 MED ORDER — LAMOTRIGINE 200 MG PO TABS: 200.0000 mg | ORAL_TABLET | Freq: Every day | ORAL | 0 refills | Status: DC

## 2021-10-11 NOTE — Progress Notes (Signed)
Virtual Visit via Telephone Note ? ?I connected with Karen Hahn on 10/11/21 at  3:30 PM EST by telephone and verified that I am speaking with the correct person using two identifiers. ? ?Location: ?[place] by phone ?Provider: office ?  ?I discussed the limitations, risks, security and privacy concerns of performing an evaluation and management service by telephone and the availability of in person appointments. I also discussed with the patient that there may be a patient responsible charge related to this service. The patient expressed understanding and agreed to proceed. ? ? ?History of Present Illness: ?Karen Hahn shares that she is still having a lot of irritability even with the recent increase in Lexapro. Karen Hahn is sure her period is going to start soon.  Her irritability gets bad a 1 week before she starts her menstrual cycle and ends 1 week after it the cycle is over.  She She saw her gynocologist regarding her PMS. They tried some birth control but it messed up her cycle a lot. Karen Hahn denies depression and anhedonia. She denies SI/HI. She denies any worsening of anxiety. Pt denies recent manic and hypomanic symptoms including periods of decreased need for sleep, increased energy, mood lability, impulsivity, FOI, and excessive spending. She denies any impulsive or uncontrolled violent outbursts.  ?  ?Observations/Objective: ? ?General Appearance: unable to assess  ?Eye Contact:  unable to assess  ?Speech:  Clear and Coherent and Normal Rate  ?Volume:  Normal  ?Mood:  Irritable  ?Affect:  Full Range  ?Thought Process:  Coherent and Descriptions of Associations: Circumstantial  ?Orientation:  Full (Time, Place, and Person)  ?Thought Content:  Rumination  ?Suicidal Thoughts:  No  ?Homicidal Thoughts:  No  ?Memory:  Immediate;   Good  ?Judgement:  Good  ?Insight:  Good  ?Psychomotor Activity: unable to assess  ?Concentration:  Concentration: Good  ?Recall:  Good   ?Fund of Knowledge:  Good  ?Language:  Good  ?Akathisia:  unable to assess  ?Handed:  unable to assess  ?AIMS (if indicated):     ?Assets:  Communication Skills ?Desire for Improvement ?Financial Resources/Insurance ?Housing ?Resilience ?Social Support ?Talents/Skills ?Transportation ?Vocational/Educational  ?ADL's:  unable to assess  ?Cognition:  WNL  ?Sleep:     ? ? ? ?Assessment and Plan: ?D/c Lexapro ? ?Previous trial- Seroquel, Haldol, Abilify, Zyprexa ? ?Start trial of low dose Depakote ER 250mg  po qHS ? ?Decrease Lamictal to 200mg  po qD- as Depakote can increase Lamictal levels ? ?1. PMDD (premenstrual dysphoric disorder) ?- divalproex (DEPAKOTE ER) 250 MG 24 hr tablet; Take 1 tablet (250 mg total) by mouth daily.  Dispense: 30 tablet; Refill: 0 ? ?2. Impulse control disorder ?- divalproex (DEPAKOTE ER) 250 MG 24 hr tablet; Take 1 tablet (250 mg total) by mouth daily.  Dispense: 30 tablet; Refill: 0 ? ?3. Bipolar II disorder (HCC) ?- divalproex (DEPAKOTE ER) 250 MG 24 hr tablet; Take 1 tablet (250 mg total) by mouth daily.  Dispense: 30 tablet; Refill: 0 ?- lamoTRIgine (LAMICTAL) 200 MG tablet; Take 1 tablet (200 mg total) by mouth daily.  Dispense: 90 tablet; Refill: 0 ? ? ? ?Follow Up Instructions: ?In 1-2 weeks or sooner if needed ?  ?I discussed the assessment and treatment plan with the patient. The patient was provided an opportunity to ask questions and all were answered. The patient agreed with the plan and demonstrated an understanding of the instructions. ?  ?The patient was advised to call  back or seek an in-person evaluation if the symptoms worsen or if the condition fails to improve as anticipated. ? ?I provided 17 minutes of non-face-to-face time during this encounter. ? ? ?Oletta Darter, MD ? ?

## 2021-10-12 ENCOUNTER — Other Ambulatory Visit: Payer: Self-pay | Admitting: Neurology

## 2021-10-15 NOTE — Progress Notes (Unsigned)
10/16/21- 46 yo F former smoker for sleep evaluation courtesy of Dr Jonny RuizJohn with concern of hypersomnolence/ OSA Medical problem list includes Migraine, Nasal Septal Deviation, IBS/Constipation, GERD, Superior Mesenteric Artery Syndrome, Arthritis, Osteopenia, Rheumatoid Arthritis, Scoliosis, ADHD,  Anorexia Nervosa, Cannabis, Depression, Anxiety, Gastric Restrictive Surgery, Hyperlipidemia,  Insomnia Epworth score- Body weight today- Covid vax- Flu vax-

## 2021-10-16 ENCOUNTER — Ambulatory Visit (INDEPENDENT_AMBULATORY_CARE_PROVIDER_SITE_OTHER): Payer: Medicare Other | Admitting: Internal Medicine

## 2021-10-16 ENCOUNTER — Encounter: Payer: Self-pay | Admitting: Internal Medicine

## 2021-10-16 ENCOUNTER — Other Ambulatory Visit: Payer: Self-pay

## 2021-10-16 VITALS — BP 100/60 | HR 92 | Temp 98.5°F | Ht 62.0 in | Wt 106.4 lb

## 2021-10-16 DIAGNOSIS — F5000 Anorexia nervosa, unspecified: Secondary | ICD-10-CM | POA: Diagnosis not present

## 2021-10-16 DIAGNOSIS — R400 Somnolence: Secondary | ICD-10-CM | POA: Diagnosis not present

## 2021-10-16 DIAGNOSIS — G4710 Hypersomnia, unspecified: Secondary | ICD-10-CM

## 2021-10-16 NOTE — Assessment & Plan Note (Signed)
She is slender but she indicates this problem is under much better control currently. ?

## 2021-10-16 NOTE — Assessment & Plan Note (Signed)
She offered an Epworth score of 7 which does not point severe somnolence.  Note history of ADHD without Adderall or other stimulant medication.  Denies cataplexy and sleep paralysis.  I think a primary disorder of hypersomnolence is unlikely.  Dr. Jonny Ruiz had questioned OSA which is a possibility.  We had appropriate discussion of sleep testing, responsibility for safe driving. ?Plan-schedule sleep study ?

## 2021-10-16 NOTE — Patient Instructions (Signed)
Order- schedule home sleep test    dx somnolence ? ?Please call us for results and recommendations about 2 weeks after your sleep test. ?

## 2021-10-21 ENCOUNTER — Other Ambulatory Visit: Payer: Self-pay | Admitting: Internal Medicine

## 2021-10-22 NOTE — Telephone Encounter (Signed)
Please refill as per office routine med refill policy (all routine meds to be refilled for 3 mo or monthly (per pt preference) up to one year from last visit, then month to month grace period for 3 mo, then further med refills will have to be denied) ? ?

## 2021-10-25 ENCOUNTER — Telehealth (HOSPITAL_BASED_OUTPATIENT_CLINIC_OR_DEPARTMENT_OTHER): Payer: Medicare Other | Admitting: Psychiatry

## 2021-10-25 ENCOUNTER — Other Ambulatory Visit: Payer: Self-pay

## 2021-10-25 DIAGNOSIS — F639 Impulse disorder, unspecified: Secondary | ICD-10-CM | POA: Diagnosis not present

## 2021-10-25 DIAGNOSIS — F3181 Bipolar II disorder: Secondary | ICD-10-CM

## 2021-10-25 DIAGNOSIS — F3281 Premenstrual dysphoric disorder: Secondary | ICD-10-CM | POA: Diagnosis not present

## 2021-10-25 DIAGNOSIS — G4700 Insomnia, unspecified: Secondary | ICD-10-CM | POA: Diagnosis not present

## 2021-10-25 MED ORDER — LAMOTRIGINE 25 MG PO TABS: ORAL_TABLET | ORAL | 0 refills | Status: DC
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
## 2021-10-26 ENCOUNTER — Telehealth: Payer: Self-pay | Admitting: Internal Medicine

## 2021-10-26 NOTE — Telephone Encounter (Signed)
Left message for patient to call back to schedule Medicare Annual Wellness Visit  ? ?No hx of AWV eligible as of 08/05/09 ? ?Please schedule at anytime with [facility] Ad[facility] if patient calls the office back.   ? ?45 Minute In Office appointment or 30 Minute Telephone Visit.  ? ?Any questions, please call me at [phone]  ?

## 2021-10-29 ENCOUNTER — Telehealth: Payer: Self-pay | Admitting: Internal Medicine

## 2021-10-29 MED ORDER — PROMETHAZINE HCL 25 MG PO TABS: 25.0000 mg | ORAL_TABLET | Freq: Every day | ORAL | 2 refills | Status: DC | PRN

## 2021-10-29 MED ORDER — ONDANSETRON 4 MG PO TBDP: 4.0000 mg | ORAL_TABLET | Freq: Three times a day (TID) | ORAL | 2 refills | Status: DC | PRN

## 2021-10-29 NOTE — Telephone Encounter (Signed)
Called patient to schedule AWV. ? ?Patient requested I send a message to her provider: ? ?She is requesting a refill on her Promethazine and also asking if dissolvable Zofran can be sent to [facility]. ?

## 2021-11-01 DIAGNOSIS — M5451 Vertebrogenic low back pain: Secondary | ICD-10-CM | POA: Diagnosis not present

## 2021-11-01 DIAGNOSIS — M5136 Other intervertebral disc degeneration, lumbar region: Secondary | ICD-10-CM | POA: Insufficient documentation

## 2021-11-01 DIAGNOSIS — M545 Low back pain, unspecified: Secondary | ICD-10-CM | POA: Diagnosis not present

## 2021-11-01 DIAGNOSIS — G8929 Other chronic pain: Secondary | ICD-10-CM | POA: Insufficient documentation

## 2021-11-06 ENCOUNTER — Ambulatory Visit (INDEPENDENT_AMBULATORY_CARE_PROVIDER_SITE_OTHER): Payer: Medicare Other

## 2021-11-06 DIAGNOSIS — Z0000 Encounter for general adult medical examination without abnormal findings: Secondary | ICD-10-CM

## 2021-11-06 NOTE — Patient Instructions (Signed)
Karen Hahn , ?Thank you for taking time to come for your Medicare Wellness Visit. I appreciate your ongoing commitment to your health goals. Please review the following plan we discussed and let me know if I can assist you in the future.  ? ?Screening recommendations/referrals: ?Colonoscopy: 05/16/2015; due every 5-7 years ?Mammogram: 06/20/2021; due every year ?Bone Density: 09/25/2020; due every 2-3 years ?Recommended yearly ophthalmology/optometry visit for glaucoma screening and checkup ?Recommended yearly dental visit for hygiene and checkup ? ?Vaccinations: ?Influenza vaccine: declined ?Pneumococcal vaccine: 08/21/2015, 03/05/2021 ?Tdap vaccine: 05/21/2017; due every 10 years ?Shingles vaccine: not of age  ?Covid-19: declined ? ?Advanced directives: Advance directive discussed with you today. Even though you declined this today please call our office should you change your mind and we can give you the proper paperwork for you to fill out. ? ?Conditions/risks identified: Yes ? ?Next appointment: Please schedule your next Medicare Wellness Visit with your Nurse Health Advisor in 1 year or 366 days by calling [phone]. ? ?Preventive Care 40-64 Years, Female ?Preventive care refers to lifestyle choices and visits with your health care provider that can promote health and wellness. ?What does preventive care include? ?A yearly physical exam. This is also called an annual well check. ?Dental exams once or twice a year. ?Routine eye exams. Ask your health care provider how often you should have your eyes checked. ?Personal lifestyle choices, including: ?Daily care of your teeth and gums. ?Regular physical activity. ?Eating a healthy diet. ?Avoiding tobacco and drug use. ?Limiting alcohol use. ?Practicing safe sex. ?Taking low-dose aspirin daily starting at age 77. ?Taking vitamin and mineral supplements as recommended by your health care provider. ?What happens during an annual well check? ?The services and  screenings done by your health care provider during your annual well check will depend on your age, overall health, lifestyle risk factors, and family history of disease. ?Counseling  ?Your health care provider may ask you questions about your: ?Alcohol use. ?Tobacco use. ?Drug use. ?Emotional well-being. ?Home and relationship well-being. ?Sexual activity. ?Eating habits. ?Work and work Statistician. ?Method of birth control. ?Menstrual cycle. ?Pregnancy history. ?Screening  ?You may have the following tests or measurements: ?Height, weight, and BMI. ?Blood pressure. ?Lipid and cholesterol levels. These may be checked every 5 years, or more frequently if you are over 30 years old. ?Skin check. ?Lung cancer screening. You may have this screening every year starting at age 16 if you have a 30-pack-year history of smoking and currently smoke or have quit within the past 15 years. ?Fecal occult blood test (FOBT) of the stool. You may have this test every year starting at age 32. ?Flexible sigmoidoscopy or colonoscopy. You may have a sigmoidoscopy every 5 years or a colonoscopy every 10 years starting at age 78. ?Hepatitis C blood test. ?Hepatitis B blood test. ?Sexually transmitted disease (STD) testing. ?Diabetes screening. This is done by checking your blood sugar (glucose) after you have not eaten for a while (fasting). You may have this done every 1-3 years. ?Mammogram. This may be done every 1-2 years. Talk to your health care provider about when you should start having regular mammograms. This may depend on whether you have a family history of breast cancer. ?BRCA-related cancer screening. This may be done if you have a family history of breast, ovarian, tubal, or peritoneal cancers. ?Pelvic exam and Pap test. This may be done every 3 years starting at age 48. Starting at age 67, this may be done every 5 years if  you have a Pap test in combination with an HPV test. ?Bone density scan. This is done to screen for  osteoporosis. You may have this scan if you are at high risk for osteoporosis. ?Discuss your test results, treatment options, and if necessary, the need for more tests with your health care provider. ?Vaccines  ?Your health care provider may recommend certain vaccines, such as: ?Influenza vaccine. This is recommended every year. ?Tetanus, diphtheria, and acellular pertussis (Tdap, Td) vaccine. You may need a Td booster every 10 years. ?Zoster vaccine. You may need this after age 20. ?Pneumococcal 13-valent conjugate (PCV13) vaccine. You may need this if you have certain conditions and were not previously vaccinated. ?Pneumococcal polysaccharide (PPSV23) vaccine. You may need one or two doses if you smoke cigarettes or if you have certain conditions. ?Talk to your health care provider about which screenings and vaccines you need and how often you need them. ?This information is not intended to replace advice given to you by your health care provider. Make sure you discuss any questions you have with your health care provider. ?Document Released: 08/18/2015 Document Revised: 04/10/2016 Document Reviewed: 05/23/2015 ?Elsevier Interactive Patient Education ? 2017 Elsevier Inc. ? ? ? ?Fall Prevention in the Home ?Falls can cause injuries. They can happen to people of all ages. There are many things you can do to make your home safe and to help prevent falls. ?What can I do on the outside of my home? ?Regularly fix the edges of walkways and driveways and fix any cracks. ?Remove anything that might make you trip as you walk through a door, such as a raised step or threshold. ?Trim any bushes or trees on the path to your home. ?Use bright outdoor lighting. ?Clear any walking paths of anything that might make someone trip, such as rocks or tools. ?Regularly check to see if handrails are loose or broken. Make sure that both sides of any steps have handrails. ?Any raised decks and porches should have guardrails on the  edges. ?Have any leaves, snow, or ice cleared regularly. ?Use sand or salt on walking paths during winter. ?Clean up any spills in your garage right away. This includes oil or grease spills. ?What can I do in the bathroom? ?Use night lights. ?Install grab bars by the toilet and in the tub and shower. Do not use towel bars as grab bars. ?Use non-skid mats or decals in the tub or shower. ?If you need to sit down in the shower, use a plastic, non-slip stool. ?Keep the floor dry. Clean up any water that spills on the floor as soon as it happens. ?Remove soap buildup in the tub or shower regularly. ?Attach bath mats securely with double-sided non-slip rug tape. ?Do not have throw rugs and other things on the floor that can make you trip. ?What can I do in the bedroom? ?Use night lights. ?Make sure that you have a light by your bed that is easy to reach. ?Do not use any sheets or blankets that are too big for your bed. They should not hang down onto the floor. ?Have a firm chair that has side arms. You can use this for support while you get dressed. ?Do not have throw rugs and other things on the floor that can make you trip. ?What can I do in the kitchen? ?Clean up any spills right away. ?Avoid walking on wet floors. ?Keep items that you use a lot in easy-to-reach places. ?If you need to reach something above  you, use a strong step stool that has a grab bar. ?Keep electrical cords out of the way. ?Do not use floor polish or wax that makes floors slippery. If you must use wax, use non-skid floor wax. ?Do not have throw rugs and other things on the floor that can make you trip. ?What can I do with my stairs? ?Do not leave any items on the stairs. ?Make sure that there are handrails on both sides of the stairs and use them. Fix handrails that are broken or loose. Make sure that handrails are as long as the stairways. ?Check any carpeting to make sure that it is firmly attached to the stairs. Fix any carpet that is loose or  worn. ?Avoid having throw rugs at the top or bottom of the stairs. If you do have throw rugs, attach them to the floor with carpet tape. ?Make sure that you have a light switch at the top of the stairs and the bottom of

## 2021-11-06 NOTE — Progress Notes (Signed)
?I connected with Karen Hahn today by telephone and verified that I am speaking with the correct person using two identifiers. ?Location patient: home ?Location provider: work ?Persons participating in the virtual visit: patient, provider. ?  ?I discussed the limitations, risks, security and privacy concerns of performing an evaluation and management service by telephone and the availability of in person appointments. I also discussed with the patient that there may be a patient responsible charge related to this service. The patient expressed understanding and verbally consented to this telephonic visit.  ?  ?Interactive audio and video telecommunications were attempted between this provider and patient, however failed, due to patient having technical difficulties OR patient did not have access to video capability.  We continued and completed visit with audio only. ? ?Some vital signs may be absent or patient reported.  ? ?Time Spent with patient on telephone encounter: 30 minutes ? ?Subjective:  ? Karen Hahn is a 46 y.o. female who presents for an Initial Medicare Annual Wellness Visit. ? ?Review of Systems    ? ?Cardiac Risk Factors include: family history of premature cardiovascular disease;dyslipidemia ? ?   ?Objective:  ?  ?Today's Vitals  ? 11/06/21 1532  ?PainSc: 8   ? ?There is no height or weight on file to calculate BMI. ? ? ?  11/06/2021  ?  3:36 PM 09/20/2020  ?  3:13 PM 04/19/2020  ? 10:44 AM 12/13/2019  ? 10:34 AM 07/23/2019  ? 11:24 AM 06/16/2018  ?  2:52 PM 03/06/2017  ? 11:33 AM  ?Advanced Directives  ?Does Patient Have a Medical Advance Directive? No No No No No No   ?Would patient like information on creating a medical advance directive? No - Patient declined No - Patient declined    No - Patient declined   ?  ? Information is confidential and restricted. Go to Review Flowsheets to unlock data.  ? ? ?Current Medications (verified) ?Outpatient Encounter Medications as of 11/06/2021  ?Medication  Sig  ? AIMOVIG 140 MG/ML SOAJ ADMINISTER 1 ML UNDER THE SKIN EVERY 30 DAYS  ? Calcium-Iron-Vit D-Vit K [id number] MG-UNT-MCG CHEW Chew 1 each by mouth daily.  ? dicyclomine (BENTYL) 10 MG capsule TAKE 1 CAPSULE BY MOUTH FOUR TIMES DAILY BEFORE MEALS AND AT BEDTIME  ? ergocalciferol (VITAMIN D2) 1.25 MG (50000 UT) capsule ergocalciferol (vitamin D2) 1,250 mcg (50,000 unit) capsule ? TAKE 1 CAPSULE BY MOUTH EVERY 7 DAYS  ? ferrous gluconate (FERGON) 324 MG tablet Take 1 tablet (324 mg total) by mouth daily with breakfast.  ? ibuprofen (ADVIL) 600 MG tablet TAKE 1 TABLET(600 MG) BY MOUTH EVERY 6 HOURS AS NEEDED  ? lamoTRIgine (LAMICTAL) 200 MG tablet Take 1 tablet (200 mg total) by mouth daily.  ? lamoTRIgine (LAMICTAL) 25 MG tablet Take 1 tablet (25 mg total) by mouth daily for 14 days, THEN 2 tablets (50 mg total) daily.  ? lubiprostone (AMITIZA) 8 MCG capsule TAKE 1 CAPSULE(8 MCG) BY MOUTH TWICE DAILY WITH A MEAL  ? naproxen (NAPROSYN) 500 MG tablet Take 1 tablet (500 mg total) by mouth 2 (two) times daily as needed.  ? omeprazole (PRILOSEC) 40 MG capsule TAKE 1 CAPSULE(40 MG) BY MOUTH DAILY  ? ondansetron (ZOFRAN-ODT) 4 MG disintegrating tablet Take 1 tablet (4 mg total) by mouth every 8 (eight) hours as needed for nausea or vomiting.  ? Pediatric Multivitamins-Iron (FLINTSTONES COMPLETE PO) Take 2 tablets by mouth daily.  ? promethazine (PHENERGAN) 25 MG tablet Take 1 tablet (25  mg total) by mouth daily as needed.  ? rizatriptan (MAXALT-MLT) 10 MG disintegrating tablet TAKE 1 TABLET BY MOUTH AS NEEDED FOR MIGRAINE, MAY REPEAT IN 2 HOURS IF NEEDED  ? rosuvastatin (CRESTOR) 20 MG tablet TAKE 1 TABLET(20 MG) BY MOUTH DAILY  ? Ubrogepant (UBRELVY) 100 MG TABS Take 100 mg by mouth as needed (Take 1 tablet at the earlist on set of a Migraine. Max 2 tablets in a 24 hour period).  ? ?No facility-administered encounter medications on file as of 11/06/2021.  ? ? ?Allergies (verified) ?Bactrim  [sulfamethoxazole-trimethoprim], Ciprofloxacin, Penicillins, Metoclopramide, Monistat [miconazole], Morphine and related, Benadryl [diphenhydramine hcl], Diflucan [fluconazole], and Diphenhydramine hcl  ? ?History: ?Past Medical History:  ?Diagnosis Date  ? Abdominal hernia   ? ADD (attention deficit disorder)   ? Allergy   ? Anorexia nervosa without bulimia   ? Chronic constipation   ? Drug abuse ([place])   ? GAD (generalized anxiety disorder)   ? GERD (gastroesophageal reflux disease)   ? History of gastric restrictive surgery   ? History of gastric ulcer   ? History of Helicobacter pylori infection   ? after 2003  ? HLD (hyperlipidemia) 02/16/2018  ? Impulse control disorder   ? Moderate major depression ([place])   ? Osteopenia   ? Osteoporosis   ? Post concussion syndrome   ? fell 10/20/2015  ? SMAS (superior mesenteric artery syndrome) ([place])   ? Ulnar neuropathy   ? Urge urinary incontinence   ? ?Past Surgical History:  ?Procedure Laterality Date  ? DILATION AND CURETTAGE OF UTERUS  2005  ? INTERSTIM IMPLANT PLACEMENT N/A 10/18/2015  ? Procedure: INTERSTIM IMPLANT FIRST STAGE;  Surgeon: Festus Aloe, MD;  Location: [facility];  Service: Urology;  Laterality: N/A;  ? INTERSTIM IMPLANT PLACEMENT N/A 10/18/2015  ? Procedure: INTERSTIM IMPLANT SECOND STAGE;  Surgeon: Festus Aloe, MD;  Location: [facility];  Service: Urology;  Laterality: N/A;  ? LAPAROSCOPIC GASTRIC RESTRICTIVE DUODENAL PROCEDURE (DUODENAL SWITCH)  12/ 2003  ? ORIF RIGHT WRIST FX  2013  ? REMOVAL HARDWARE X2 in  2013--  No retained hardware  ? ?Family History  ?Problem Relation Age of Onset  ? Alcohol abuse Mother   ? Anxiety disorder Mother   ? Depression Mother   ?     severe with hx of suicide attempt. treated with ECT  ? COPD Mother   ? Cancer Mother   ?     Cervical  ? Stroke Mother   ? Anxiety disorder Father   ? Drug abuse Father   ? COPD Father   ? Alcohol abuse Maternal Uncle   ? Anxiety disorder Maternal  Grandmother   ? Depression Maternal Grandmother   ? ADD / ADHD Brother   ? Breast cancer Paternal Grandmother 70  ? Stomach cancer Neg Hx   ? Colon cancer Neg Hx   ? Rectal cancer Neg Hx   ? Esophageal cancer Neg Hx   ? ?Social History  ? ?Socioeconomic History  ? Marital status: Divorced  ?  Spouse name: Not on file  ? Number of children: 0  ? Years of education: 44  ? Highest education level: Not on file  ?Occupational History  ? Occupation: Disability  ?Tobacco Use  ? Smoking status: Former  ?  Packs/day: 0.50  ?  Years: 1.00  ?  Pack years: 0.50  ?  Types: Cigarettes  ?  Quit date: 12/03/2016  ?  Years since quitting: 4.9  ?  Smokeless tobacco: Never  ? Tobacco comments:  ?  quit smoking 8 months ago as of 10/15/18  ?Vaping Use  ? Vaping Use: Never used  ?Substance and Sexual Activity  ? Alcohol use: No  ? Drug use: No  ?  Frequency: 7.0 times per week  ?  Types: Marijuana  ?  Comment: hx of and last used cocaine 2012. she was smoking marjiuana 7g/week. smoking thru out the day but quit 4 months ago  ? Sexual activity: Not Currently  ?  Comment: 1st intercourse 46 yo-Fewer than 5 partners  ?Other Topics Concern  ? Not on file  ?Social History Narrative  ? Fun: Play piano; Build Lego  ? Denies abuse and feels safe at home. Living with friend in [place].   ? Right handed   ? Drink coffee 1 cup eveyday  ? Leave with 2 friends  ? 1 story home, 16 steps  ? ?Social Determinants of Health  ? ?Financial Resource Strain: Low Risk   ? Difficulty of Paying Living Expenses: Not hard at all  ?Food Insecurity: No Food Insecurity  ? Worried About Charity fundraiser in the Last Year: Never true  ? Ran Out of Food in the Last Year: Never true  ?Transportation Needs: No Transportation Needs  ? Lack of Transportation (Medical): No  ? Lack of Transportation (Non-Medical): No  ?Physical Activity: Inactive  ? Days of Exercise per Week: 0 days  ? Minutes of Exercise per Session: 0 min  ?Stress: No Stress Concern Present  ? Feeling  of Stress : Not at all  ?Social Connections: Unknown  ? Frequency of Communication with Friends and Family: More than three times a week  ? Frequency of Social Gatherings with Friends and Family: More than three times a week  ? Kathy Breach

## 2021-11-12 ENCOUNTER — Encounter: Payer: Self-pay | Admitting: Internal Medicine

## 2021-11-13 ENCOUNTER — Other Ambulatory Visit (INDEPENDENT_AMBULATORY_CARE_PROVIDER_SITE_OTHER): Payer: Medicare Other

## 2021-11-13 ENCOUNTER — Encounter: Payer: Self-pay | Admitting: Internal Medicine

## 2021-11-13 DIAGNOSIS — R739 Hyperglycemia, unspecified: Secondary | ICD-10-CM

## 2021-11-13 DIAGNOSIS — E538 Deficiency of other specified B group vitamins: Secondary | ICD-10-CM | POA: Diagnosis not present

## 2021-11-13 DIAGNOSIS — E559 Vitamin D deficiency, unspecified: Secondary | ICD-10-CM | POA: Diagnosis not present

## 2021-11-13 DIAGNOSIS — E7800 Pure hypercholesterolemia, unspecified: Secondary | ICD-10-CM

## 2021-11-13 LAB — CBC WITH DIFFERENTIAL/PLATELET
Basophils Absolute: 0 10*3/uL (ref 0.0–0.1)
Basophils Relative: 0.7 % (ref 0.0–3.0)
Eosinophils Absolute: 0.1 10*3/uL (ref 0.0–0.7)
Eosinophils Relative: 1.6 % (ref 0.0–5.0)
HCT: 37.6 % (ref 36.0–46.0)
Hemoglobin: 12.9 g/dL (ref 12.0–15.0)
Lymphocytes Relative: 33 % (ref 12.0–46.0)
Lymphs Abs: 2 10*3/uL (ref 0.7–4.0)
MCHC: 34.2 g/dL (ref 30.0–36.0)
MCV: 87.4 fl (ref 78.0–100.0)
Monocytes Absolute: 0.3 10*3/uL (ref 0.1–1.0)
Monocytes Relative: 5.8 % (ref 3.0–12.0)
Neutro Abs: 3.5 10*3/uL (ref 1.4–7.7)
Neutrophils Relative %: 58.9 % (ref 43.0–77.0)
Platelets: 303 10*3/uL (ref 150.0–400.0)
RBC: 4.3 Mil/uL (ref 3.87–5.11)
RDW: 13.1 % (ref 11.5–15.5)
WBC: 6 10*3/uL (ref 4.0–10.5)

## 2021-11-13 LAB — HEPATIC FUNCTION PANEL
ALT: 13 U/L (ref 0–35)
AST: 15 U/L (ref 0–37)
Albumin: 4.6 g/dL (ref 3.5–5.2)
Alkaline Phosphatase: 59 U/L (ref 39–117)
Bilirubin, Direct: 0.2 mg/dL (ref 0.0–0.3)
Total Bilirubin: 0.7 mg/dL (ref 0.2–1.2)
Total Protein: 7.3 g/dL (ref 6.0–8.3)

## 2021-11-13 LAB — URINALYSIS, ROUTINE W REFLEX MICROSCOPIC
Bilirubin Urine: NEGATIVE
Ketones, ur: NEGATIVE
Leukocytes,Ua: NEGATIVE
Nitrite: NEGATIVE
Specific Gravity, Urine: 1.01 (ref 1.000–1.030)
Total Protein, Urine: NEGATIVE
Urine Glucose: NEGATIVE
Urobilinogen, UA: 0.2 (ref 0.0–1.0)
pH: 6.5 (ref 5.0–8.0)

## 2021-11-13 LAB — VITAMIN B12: Vitamin B-12: 568 pg/mL (ref 211–911)

## 2021-11-13 LAB — BASIC METABOLIC PANEL
BUN: 14 mg/dL (ref 6–23)
CO2: 28 mEq/L (ref 19–32)
Calcium: 9.7 mg/dL (ref 8.4–10.5)
Chloride: 104 mEq/L (ref 96–112)
Creatinine, Ser: 0.71 mg/dL (ref 0.40–1.20)
GFR: 104.23 mL/min (ref >60.00)
Glucose, Bld: 91 mg/dL (ref 70–99)
Potassium: 3.9 mEq/L (ref 3.5–5.1)
Sodium: 140 mEq/L (ref 135–145)

## 2021-11-13 LAB — VITAMIN D 25 HYDROXY (VIT D DEFICIENCY, FRACTURES): VITD: 18.23 ng/mL — ABNORMAL LOW (ref 30.00–100.00)

## 2021-11-13 LAB — LIPID PANEL
Cholesterol: 197 mg/dL (ref 0–200)
HDL: 59.3 mg/dL (ref >39.00)
LDL Cholesterol: 118 mg/dL — ABNORMAL HIGH (ref 0–99)
NonHDL: 137.66
Total CHOL/HDL Ratio: 3
Triglycerides: 96 mg/dL (ref 0.0–149.0)
VLDL: 19.2 mg/dL (ref 0.0–40.0)

## 2021-11-13 LAB — TSH: TSH: 0.9 u[IU]/mL (ref 0.35–5.50)

## 2021-11-13 LAB — HEMOGLOBIN A1C: Hgb A1c MFr Bld: 5.3 % (ref 4.6–6.5)

## 2021-11-18 DIAGNOSIS — M542 Cervicalgia: Secondary | ICD-10-CM | POA: Diagnosis not present

## 2021-11-18 DIAGNOSIS — M549 Dorsalgia, unspecified: Secondary | ICD-10-CM | POA: Diagnosis not present

## 2021-11-20 ENCOUNTER — Ambulatory Visit: Payer: Medicare Other

## 2021-11-20 DIAGNOSIS — G4733 Obstructive sleep apnea (adult) (pediatric): Secondary | ICD-10-CM | POA: Diagnosis not present

## 2021-11-20 DIAGNOSIS — R400 Somnolence: Secondary | ICD-10-CM

## 2021-11-23 ENCOUNTER — Encounter: Payer: Self-pay | Admitting: Internal Medicine

## 2021-11-23 DIAGNOSIS — G4733 Obstructive sleep apnea (adult) (pediatric): Secondary | ICD-10-CM | POA: Diagnosis not present

## 2021-11-23 MED ORDER — ONDANSETRON 8 MG PO TBDP: 8.0000 mg | ORAL_TABLET | Freq: Three times a day (TID) | ORAL | 2 refills | Status: DC | PRN

## 2021-11-26 DIAGNOSIS — G4733 Obstructive sleep apnea (adult) (pediatric): Secondary | ICD-10-CM | POA: Diagnosis not present

## 2021-11-26 DIAGNOSIS — J343 Hypertrophy of nasal turbinates: Secondary | ICD-10-CM | POA: Diagnosis not present

## 2021-11-26 DIAGNOSIS — J342 Deviated nasal septum: Secondary | ICD-10-CM | POA: Diagnosis not present

## 2021-11-26 NOTE — Telephone Encounter (Signed)
CPAP, a fitted oral appliance and ENT evaluation are options. ?Usually CPAP is the best and most flexible initial therapy, but we can refer for other methods if she wishes. ? ?Suggest order new DME, new CPAP auto 5-15, mask of choice, humidifier, supplies, AirView/ card ?She has a f/u appt in June. ?

## 2021-11-26 NOTE — Telephone Encounter (Signed)
Mychart message sent by pt: ?Karen Hahn "[name]"  P [facility] (supporting Jetty Duhamel D, MD) 3 days ago  ? ?To whom it may concern,  ?I did receive the results on my chart that showed mild sleep apnea.  I was wondering what the next step is? I am no longer able to pull up the result on my phone.  I do have an appointment with an ent on the 24th of this month.  ?  ?I was wondering if cpap could be an option.  ?  ?Please let me know at your earliest convenience ? ? ? ? ?Dr. Maple Hudson, please advise. ?

## 2021-11-29 ENCOUNTER — Telehealth (HOSPITAL_BASED_OUTPATIENT_CLINIC_OR_DEPARTMENT_OTHER): Payer: Medicare Other | Admitting: Psychiatry

## 2021-11-29 DIAGNOSIS — G4700 Insomnia, unspecified: Secondary | ICD-10-CM

## 2021-11-29 DIAGNOSIS — F3281 Premenstrual dysphoric disorder: Secondary | ICD-10-CM | POA: Diagnosis not present

## 2021-11-29 DIAGNOSIS — F3181 Bipolar II disorder: Secondary | ICD-10-CM

## 2021-11-29 DIAGNOSIS — F5000 Anorexia nervosa, unspecified: Secondary | ICD-10-CM

## 2021-11-29 DIAGNOSIS — F639 Impulse disorder, unspecified: Secondary | ICD-10-CM

## 2021-11-29 DIAGNOSIS — F411 Generalized anxiety disorder: Secondary | ICD-10-CM

## 2021-11-29 DIAGNOSIS — M545 Low back pain, unspecified: Secondary | ICD-10-CM | POA: Diagnosis not present

## 2021-11-29 MED ORDER — LAMOTRIGINE 100 MG PO TABS: 100.0000 mg | ORAL_TABLET | Freq: Every day | ORAL | 0 refills | Status: DC

## 2021-11-29 MED ORDER — LAMOTRIGINE 200 MG PO TABS: 200.0000 mg | ORAL_TABLET | Freq: Every day | ORAL | 0 refills | Status: DC

## 2021-11-29 MED ORDER — LAMOTRIGINE 25 MG PO TABS: 75.0000 mg | ORAL_TABLET | Freq: Every day | ORAL | 0 refills | Status: DC

## 2021-11-29 NOTE — Progress Notes (Signed)
Virtual Visit via Video Note ? ?I connected with Karen Hahn on 11/29/21 at  3:45 PM EDT by a video enabled telemedicine application and verified that I am speaking with the correct person using two identifiers. ? ?Location: ?Patient: in parked car ?Provider: office ?  ?I discussed the limitations of evaluation and management by telemedicine and the availability of in person appointments. The patient expressed understanding and agreed to proceed. ? ?History of Present Illness: ?[name] shares that the increase in Lamictal has only helped a little. She is a little less irritable but not enough. It comes out of nowhere and lasts for several minutes several times a day. She is not able to tell if it is related to her menstrual cycle. Depression is mild. She denies isolation and anhedonia. Her sleep is not as good due to back pain. Energy is low and concentration is poor. Appetite is good. She is getting about 6 hrs/night. She denies anxiety and panic attacks. She denies manic and hypomanic like symptoms or episodes. She denies SI/HI. ?  ?Observations/Objective: ?Psychiatric Specialty Exam: ?ROS  ?There were no vitals taken for this visit.There is no height or weight on file to calculate BMI.  ?General Appearance: Casual and Fairly Groomed  ?Eye Contact:  Good  ?Speech:  Clear and Coherent and Normal Rate  ?Volume:  Normal  ?Mood:  Irritable  ?Affect:  Full Range  ?Thought Process:  Goal Directed, Linear, and Descriptions of Associations: Intact  ?Orientation:  Full (Time, Place, and Person)  ?Thought Content:  Logical  ?Suicidal Thoughts:  No  ?Homicidal Thoughts:  No  ?Memory:  Immediate;   Good  ?Judgement:  Good  ?Insight:  Good  ?Psychomotor Activity:  Normal  ?Concentration:  Concentration: Good  ?Recall:  Good  ?Fund of Knowledge:  Good  ?Language:  Good  ?Akathisia:  No  ?Handed:  Right  ?AIMS (if indicated):     ?Assets:  Communication Skills ?Desire for Improvement ?Financial  Resources/Insurance ?Housing ?Leisure Time ?Resilience ?Social Support ?Talents/Skills ?Transportation ?Vocational/Educational  ?ADL's:  Intact  ?Cognition:  WNL  ?Sleep:     ? ? ? ?Assessment and Plan: ? ? ?  11/29/2021  ?  4:01 PM 11/06/2021  ?  3:38 PM 10/25/2021  ?  4:12 PM 09/27/2021  ?  3:43 PM 07/19/2021  ?  3:51 PM  ?Depression screen PHQ 2/9  ?Decreased Interest 0 0  0 0  ?Down, Depressed, Hopeless 1 0 3 0 1  ?PHQ - 2 Score 1 0 3 0 1  ?Altered sleeping 1  0    ?Tired, decreased energy 3  3    ?Change in appetite 0  0    ?Trouble concentrating 3  0    ?Moving slowly or fidgety/restless 0  0    ?Suicidal thoughts 0  0    ?PHQ-9 Score 8  6    ?Difficult doing work/chores Very difficult  Somewhat difficult    ? ? ?Flowsheet Row Video Visit from 11/29/2021 in [facility] Video Visit from 10/25/2021 in [facility] Video Visit from 09/27/2021 in [facility]  ?C-SSRS RISK CATEGORY No Risk No Risk No Risk  ? ?  ? ? ?Status of current problems:  ongoing  ? ?Meds:  ?Titrate Lamictal to 300mg  qD ? ?1. Bipolar II disorder (HCC) ?- lamoTRIgine (LAMICTAL) 200 MG tablet; Take 1 tablet (200 mg total) by mouth daily.  Dispense: 90 tablet; Refill: 0 ?- lamoTRIgine (  LAMICTAL) 25 MG tablet; Take 3 tablets (75 mg total) by mouth daily for 14 days.  Dispense: 42 tablet; Refill: 0 ?- lamoTRIgine (LAMICTAL) 100 MG tablet; Take 1 tablet (100 mg total) by mouth daily.  Dispense: 90 tablet; Refill: 0 ? ?2. PMDD (premenstrual dysphoric disorder) ? ?3. Impulse control disorder ? ?4. Insomnia, unspecified type ? ?5. GAD (generalized anxiety disorder) ? ?6. Anorexia nervosa ? ? ? ?Therapy: brief supportive therapy provided.  ? ? ? ? ?Collaboration of Care: Other none today ? ?Patient/Guardian was advised Release of Information must be obtained prior to any record release in order to collaborate their care with an outside  provider. Patient/Guardian was advised if they have not already done so to contact the registration department to sign all necessary forms in order for [place] to release information regarding their care.  ? ?Consent: Patient/Guardian gives verbal consent for treatment and assignment of benefits for services provided during this visit. Patient/Guardian expressed understanding and agreed to proceed.  ? ? ? ? ?Follow Up Instructions: ?Follow up in 1 -2 month or sooner if needed ?  ? ?I discussed the assessment and treatment plan with the patient. The patient was provided an opportunity to ask questions and all were answered. The patient agreed with the plan and demonstrated an understanding of the instructions. ?  ?The patient was advised to call back or seek an in-person evaluation if the symptoms worsen or if the condition fails to improve as anticipated. ? ?I provided 12 minutes of non-face-to-face time during this encounter. ? ? ?Charlcie Cradle, MD ? ?

## 2021-12-10 ENCOUNTER — Ambulatory Visit: Payer: Medicare Other | Admitting: Internal Medicine

## 2021-12-10 ENCOUNTER — Other Ambulatory Visit: Payer: Self-pay | Admitting: Internal Medicine

## 2021-12-11 ENCOUNTER — Encounter: Payer: Self-pay | Admitting: Internal Medicine

## 2021-12-11 MED ORDER — ONDANSETRON 4 MG PO TBDP
4.0000 mg | ORAL_TABLET | Freq: Three times a day (TID) | ORAL | 2 refills | Status: DC | PRN
Start: 2021-12-11 — End: 2022-03-12

## 2021-12-13 ENCOUNTER — Other Ambulatory Visit (HOSPITAL_COMMUNITY): Payer: Self-pay | Admitting: Psychiatry

## 2021-12-13 DIAGNOSIS — F3181 Bipolar II disorder: Secondary | ICD-10-CM

## 2021-12-16 ENCOUNTER — Other Ambulatory Visit (HOSPITAL_COMMUNITY): Payer: Self-pay | Admitting: Psychiatry

## 2021-12-16 DIAGNOSIS — F3181 Bipolar II disorder: Secondary | ICD-10-CM

## 2021-12-17 ENCOUNTER — Other Ambulatory Visit: Payer: Self-pay

## 2021-12-17 DIAGNOSIS — M8580 Other specified disorders of bone density and structure, unspecified site: Secondary | ICD-10-CM

## 2021-12-18 ENCOUNTER — Encounter: Payer: Self-pay | Admitting: Internal Medicine

## 2021-12-19 ENCOUNTER — Encounter: Payer: Self-pay | Admitting: Internal Medicine

## 2021-12-19 MED ORDER — PROMETHAZINE HCL 25 MG PO TABS: 25.0000 mg | ORAL_TABLET | Freq: Every day | ORAL | 2 refills | Status: DC | PRN

## 2021-12-20 DIAGNOSIS — M545 Low back pain, unspecified: Secondary | ICD-10-CM | POA: Diagnosis not present

## 2021-12-20 NOTE — Telephone Encounter (Signed)
Message sent to [employer] for referral information on CPAP.

## 2021-12-25 DIAGNOSIS — J343 Hypertrophy of nasal turbinates: Secondary | ICD-10-CM | POA: Diagnosis not present

## 2021-12-25 DIAGNOSIS — J342 Deviated nasal septum: Secondary | ICD-10-CM | POA: Diagnosis not present

## 2021-12-25 DIAGNOSIS — M950 Acquired deformity of nose: Secondary | ICD-10-CM | POA: Diagnosis not present

## 2021-12-28 ENCOUNTER — Ambulatory Visit (INDEPENDENT_AMBULATORY_CARE_PROVIDER_SITE_OTHER): Payer: Medicare Other | Admitting: Physician Assistant

## 2021-12-28 ENCOUNTER — Encounter: Payer: Self-pay | Admitting: Physician Assistant

## 2021-12-28 VITALS — BP 110/60 | HR 105 | Ht 62.0 in | Wt 99.0 lb

## 2021-12-28 DIAGNOSIS — K5900 Constipation, unspecified: Secondary | ICD-10-CM | POA: Diagnosis not present

## 2021-12-28 DIAGNOSIS — R1084 Generalized abdominal pain: Secondary | ICD-10-CM | POA: Diagnosis not present

## 2021-12-28 DIAGNOSIS — R110 Nausea: Secondary | ICD-10-CM

## 2021-12-28 DIAGNOSIS — K432 Incisional hernia without obstruction or gangrene: Secondary | ICD-10-CM | POA: Diagnosis not present

## 2021-12-28 MED ORDER — PROMETHAZINE HCL 25 MG PO TABS: 25.0000 mg | ORAL_TABLET | Freq: Four times a day (QID) | ORAL | 5 refills | Status: AC | PRN

## 2021-12-28 MED ORDER — DICYCLOMINE HCL 10 MG PO CAPS: 10.0000 mg | ORAL_CAPSULE | Freq: Four times a day (QID) | ORAL | 5 refills | Status: DC

## 2021-12-28 NOTE — Progress Notes (Signed)
Chief Complaint: Abdominal pain  HPI:  [name] is a 46 year old female, known to Dr. Marina GoodellPerry, with a past medical history as listed below including drug abuse, GAD, GERD and multiple others, who was referred to me by Corwin LevinsJohn, [name], MD for a complaint of abdominal pain.      06/03/2018 EGD with prior gastric surgery noted with small bowel limbs.  Some mild duodenitis otherwise normal.    07/11/2020 patient seen in clinic and described chronic abdominal issues which had gotten worse recently with epigastric cramping and nausea.  She was using Dicyclomine 10 mg 4 times a day but it stopped working.  At times because she had a history of similar symptoms with epigastric pain nausea and vomiting thought possibility to SMA.  Patient was requesting repeat EGD this was ordered.  Also increased her Dicyclomine to 20 mg 4 times daily.  She was continued on Promethazine in the morning and given Zofran for breakthrough.    07/27/2020 EGD with evidence of prior gastrojejunostomy and otherwise normal with no abnormality to explain symptoms of nausea.    01/25/2022 patient is scheduled for open incisional hernia repair.    Today, the patient presents to clinic and tells me that over the past couple of weeks she had worse nausea than her normal.  She has restarted her Phenergan 25 mg in the morning which seems to hold her until around midday and then she will start taking a combination of Zofran 8 mg or Pepto.  Tells me that with this combination she never vomits, but it is something that she is always battling.  Also describes occasional pain throughout her abdomen sometimes so severe that it can last the whole day and make her really not do much at all.  She does tell me that she is having her incisional hernia repaired surgically soon as her thoughts that this may be giving her pain occasionally.    Also tells me she has had very hard stools lately which are hard to pass and occasionally sees some blood.    Denies  fever, chills, weight loss, melena or symptoms that awaken her from sleep.  Past Medical History:  Diagnosis Date   Abdominal hernia    ADD (attention deficit disorder)    Allergy    Anorexia nervosa without bulimia    Chronic constipation    Drug abuse (HCC)    GAD (generalized anxiety disorder)    GERD (gastroesophageal reflux disease)    History of gastric restrictive surgery    History of gastric ulcer    History of Helicobacter pylori infection    after 2003   HLD (hyperlipidemia) 02/16/2018   Impulse control disorder    Moderate major depression (HCC)    Osteopenia    Osteoporosis    Post concussion syndrome    fell 10/20/2015   SMAS (superior mesenteric artery syndrome) (HCC)    Ulnar neuropathy    Urge urinary incontinence     Past Surgical History:  Procedure Laterality Date   DILATION AND CURETTAGE OF UTERUS  2005   INTERSTIM IMPLANT PLACEMENT N/A 10/18/2015   Procedure: Leane PlattINTERSTIM IMPLANT FIRST STAGE;  Surgeon: Jerilee FieldMatthew Eskridge, MD;  [facility] [place];  Service: Urology;  Laterality: N/A;   INTERSTIM IMPLANT PLACEMENT N/A 10/18/2015   Procedure: Leane PlattINTERSTIM IMPLANT SECOND STAGE;  Surgeon: Jerilee FieldMatthew Eskridge, MD;  [facility] [place];  Service: Urology;  Laterality: N/A;   LAPAROSCOPIC GASTRIC RESTRICTIVE DUODENAL PROCEDURE (DUODENAL SWITCH)  12/ 2003  ORIF RIGHT WRIST FX  2013   REMOVAL HARDWARE X2 in  2013--  No retained hardware    Current Outpatient Medications  Medication Sig Dispense Refill   AIMOVIG 140 MG/ML SOAJ ADMINISTER 1 ML UNDER THE SKIN EVERY 30 DAYS 1 mL 2   Calcium-Iron-Vit D-Vit K [id number] MG-UNT-MCG CHEW Chew 1 each by mouth daily.     dicyclomine (BENTYL) 10 MG capsule TAKE 1 CAPSULE BY MOUTH FOUR TIMES DAILY BEFORE MEALS AND AT BEDTIME     ergocalciferol (VITAMIN D2) 1.25 MG (50000 UT) capsule ergocalciferol (vitamin D2) 1,250 mcg (50,000 unit) capsule  TAKE 1 CAPSULE BY MOUTH EVERY 7 DAYS     ferrous  gluconate (FERGON) 324 MG tablet Take 1 tablet (324 mg total) by mouth daily with breakfast. 90 tablet 3   ibandronate (BONIVA) 150 MG tablet Take 150 mg by mouth every 30 (thirty) days.     ibuprofen (ADVIL) 600 MG tablet TAKE 1 TABLET(600 MG) BY MOUTH EVERY 6 HOURS AS NEEDED 60 tablet 0   lamoTRIgine (LAMICTAL) 100 MG tablet Take 1 tablet (100 mg total) by mouth daily. 90 tablet 0   lamoTRIgine (LAMICTAL) 200 MG tablet Take 1 tablet (200 mg total) by mouth daily. 90 tablet 0   lamoTRIgine (LAMICTAL) 25 MG tablet Take 3 tablets (75 mg total) by mouth daily for 14 days. 42 tablet 0   lubiprostone (AMITIZA) 8 MCG capsule TAKE 1 CAPSULE(8 MCG) BY MOUTH TWICE DAILY WITH A MEAL 60 capsule 6   naproxen (NAPROSYN) 500 MG tablet Take 1 tablet (500 mg total) by mouth 2 (two) times daily as needed. 20 tablet 3   omeprazole (PRILOSEC) 40 MG capsule TAKE 1 CAPSULE(40 MG) BY MOUTH DAILY 90 capsule 2   ondansetron (ZOFRAN-ODT) 4 MG disintegrating tablet Take 1 tablet (4 mg total) by mouth every 8 (eight) hours as needed for nausea or vomiting. 40 tablet 2   oxyCODONE-acetaminophen (PERCOCET/ROXICET) 5-325 MG tablet Take 1 tablet by mouth every 6 (six) hours as needed.     Pediatric Multivitamins-Iron (FLINTSTONES COMPLETE PO) Take 2 tablets by mouth daily.     promethazine (PHENERGAN) 25 MG tablet Take 1 tablet (25 mg total) by mouth daily as needed. 30 tablet 2   rizatriptan (MAXALT-MLT) 10 MG disintegrating tablet TAKE 1 TABLET BY MOUTH AS NEEDED FOR MIGRAINE, MAY REPEAT IN 2 HOURS IF NEEDED 10 tablet 2   rosuvastatin (CRESTOR) 20 MG tablet TAKE 1 TABLET(20 MG) BY MOUTH DAILY 90 tablet 3   Ubrogepant (UBRELVY) 100 MG TABS Take 100 mg by mouth as needed (Take 1 tablet at the earlist on set of a Migraine. Max 2 tablets in a 24 hour period). 16 tablet 5   No current facility-administered medications for this visit.    Allergies as of 12/28/2021 - Review Complete 11/29/2021  Allergen Reaction Noted   Bactrim  [sulfamethoxazole-trimethoprim] Other (See Comments) 11/15/2014   Ciprofloxacin Itching 06/19/2015   Penicillins Other (See Comments) 11/16/2013   Metoclopramide Other (See Comments) 12/05/2015   Monistat [miconazole] Swelling 08/21/2021   Morphine and related Itching and Other (See Comments) 09/01/2015   Benadryl [diphenhydramine hcl] Palpitations 12/05/2015   Diflucan [fluconazole] Rash 11/16/2013   Diphenhydramine hcl Palpitations 12/05/2015    Family History  Problem Relation Age of Onset   Alcohol abuse Mother    Anxiety disorder Mother    Depression Mother        severe with hx of suicide attempt. treated with ECT   COPD Mother  Cancer Mother        Cervical   Stroke Mother    Anxiety disorder Father    Drug abuse Father    COPD Father    Alcohol abuse Maternal Uncle    Anxiety disorder Maternal Grandmother    Depression Maternal Grandmother    ADD / ADHD Brother    Breast cancer Paternal Grandmother 63   Stomach cancer Neg Hx    Colon cancer Neg Hx    Rectal cancer Neg Hx    Esophageal cancer Neg Hx     Social History   Socioeconomic History   Marital status: Divorced    Spouse name: Not on file   Number of children: 0   Years of education: 14   Highest education level: Not on file  Occupational History   Occupation: Disability  Tobacco Use   Smoking status: Former    Packs/day: 0.50    Years: 1.00    Pack years: 0.50    Types: Cigarettes    Quit date: 12/03/2016    Years since quitting: 5.0   Smokeless tobacco: Never   Tobacco comments:    quit smoking 8 months ago as of 10/15/18  Vaping Use   Vaping Use: Never used  Substance and Sexual Activity   Alcohol use: No   Drug use: No    Frequency: 7.0 times per week    Types: Marijuana    Comment: hx of and last used cocaine 2012. she was smoking marjiuana 7g/week. smoking thru out the day but quit 4 months ago   Sexual activity: Not Currently    Comment: 1st intercourse 46 yo-Fewer than 5 partners   Other Topics Concern   Not on file  Social History Narrative   Fun: Play piano; Build Lego   Denies abuse and feels safe at home. Living with friend in [place].    Right handed    Drink coffee 1 cup eveyday   Leave with 2 friends   1 story home, 16 steps   Social Determinants of Health   Financial Resource Strain: Low Risk    Difficulty of Paying Living Expenses: Not hard at all  Food Insecurity: No Food Insecurity   Worried About Programme researcher, broadcasting/film/video in the Last Year: Never true   Barista in the Last Year: Never true  Transportation Needs: No Transportation Needs   Lack of Transportation (Medical): No   Lack of Transportation (Non-Medical): No  Physical Activity: Inactive   Days of Exercise per Week: 0 days   Minutes of Exercise per Session: 0 min  Stress: No Stress Concern Present   Feeling of Stress : Not at all  Social Connections: Unknown   Frequency of Communication with Friends and Family: More than three times a week   Frequency of Social Gatherings with Friends and Family: More than three times a week   Attends Religious Services: Patient refused   Database administrator or Organizations: Patient refused   Attends Engineer, structural: Patient refused   Marital Status: Divorced  Catering manager Violence: Not At Risk   Fear of Current or Ex-Partner: No   Emotionally Abused: No   Physically Abused: No   Sexually Abused: No    Review of Systems:    Constitutional: No weight loss, fever or chills Cardiovascular: No chest pain Respiratory: No SOB  Gastrointestinal: See HPI and otherwise negative   Physical Exam:  Vital signs: BP 110/60   Pulse Marland Kitchen)  105   Ht  (1.575 m)   Wt 99 lb (44.9 kg)   BMI 18.11 kg/m    Constitutional:   Pleasant thin appearing Caucasian female appears to be in NAD, Well developed, Well nourished, alert and cooperative Respiratory: Respirations even and unlabored. Lungs clear to auscultation bilaterally.   No  wheezes, crackles, or rhonchi.  Cardiovascular: Normal S1, S2. No MRG. Regular rate and rhythm. No peripheral edema, cyanosis or pallor.  Gastrointestinal:  Soft, nondistended, nontender. No rebound or guarding. Normal bowel sounds. No appreciable masses or hepatomegaly.+reducible, non-tender, small incisional hernia Rectal:  Not performed.  Psychiatric: Oriented to person, place and time. Demonstrates good judgement and reason without abnormal affect or behaviors.  RELEVANT LABS AND IMAGING: CBC    Component Value Date/Time   WBC 6.0 11/13/2021 0942   RBC 4.30 11/13/2021 0942   HGB 12.9 11/13/2021 0942   HCT 37.6 11/13/2021 0942   PLT 303.0 11/13/2021 0942   MCV 87.4 11/13/2021 0942   MCH 30.0 04/20/2020 1627   MCHC 34.2 11/13/2021 0942   RDW 13.1 11/13/2021 0942   LYMPHSABS 2.0 11/13/2021 0942   MONOABS 0.3 11/13/2021 0942   EOSABS 0.1 11/13/2021 0942   BASOSABS 0.0 11/13/2021 0942    CMP     Component Value Date/Time   NA 140 11/13/2021 0942   K 3.9 11/13/2021 0942   CL 104 11/13/2021 0942   CO2 28 11/13/2021 0942   GLUCOSE 91 11/13/2021 0942   BUN 14 11/13/2021 0942   CREATININE 0.71 11/13/2021 0942   CALCIUM 9.7 11/13/2021 0942   PROT 7.3 11/13/2021 0942   ALBUMIN 4.6 11/13/2021 0942   AST 15 11/13/2021 0942   ALT 13 11/13/2021 0942   ALKPHOS 59 11/13/2021 0942   BILITOT 0.7 11/13/2021 0942   GFRNONAA >60 12/25/2016 1201   GFRAA >60 12/25/2016 1201    Assessment: 1.  Chronic nausea: Patient typically controls with Promethazine 25 mg in the morning and then a mixture of Pepto and Zofran throughout the day, EGD and 2021 was normal; symptoms not related to SMA syndrome 2.  Abdominal pain: With history of IBS and incisional hernia; discussed with patient I do not feel like a lot of the pain is coming from her hernia as this is very small and reducible on exam, likely still her chronic abdominal pain with IBS 3.  Constipation: Increase in hard stools recently which  are hard to pass  Plan: 1.  Discussed with patient that she could try increasing her Phenergan as this does not make her tired and scheduled every 6 hours.  Prescribed #90 with 5 refills for patient to try this. 2.  Can continue Zofran for breakthrough symptoms. 3.  Recommend she add back in her Dicyclomine 10 mg up to 4 times daily, 20-30 min before meals and at bedtime.  Refilled prescription for her. 4.  Would recommend starting MiraLAX once daily for constipation 5.  Discussed hernia surgery.  I explained that I am not sure we will fix any of her problems but it is worth a shot. 6.  Patient to follow in clinic with [place] as needed or at the age of 13 for her colonoscopy.  Hyacinth Meeker, PA-C [facility] 12/28/2021, 3:42 PM  Cc: Corwin Levins, MD

## 2021-12-28 NOTE — Patient Instructions (Signed)
If you are age 44 or older, your body mass index should be between 23-30. Your Body mass index is 18.11 kg/m. If this is out of the aforementioned range listed, please consider follow up with your Primary Care Provider.  If you are age 8964 or younger, your body mass index should be between 19-25. Your Body mass index is 18.11 kg/m. If this is out of the aformentioned range listed, please consider follow up with your Primary Care Provider.   We have sent the following medications to your pharmacy for you to pick up at your convenience: Phenergan 25 mg every 6 hours. Dicyclomine 10 mg four times daily.   Start Miralax once daily.  The [facility] providers would like to encourage you to use [facility] to communicate with providers for non-urgent requests or questions.  Due to long hold times on the telephone, sending your provider a message by [facility] may be a faster and more efficient way to get a response.  Please allow 48 business hours for a response.  Please remember that this is for non-urgent requests.   It was a pleasure to see you today!  Thank you for trusting me with your gastrointestinal care!    Hyacinth MeekerJennifer Lemmon, PA-C

## 2021-12-29 NOTE — Progress Notes (Signed)
Noted  

## 2022-01-03 ENCOUNTER — Telehealth (HOSPITAL_BASED_OUTPATIENT_CLINIC_OR_DEPARTMENT_OTHER): Payer: Medicare Other | Admitting: Psychiatry

## 2022-01-03 ENCOUNTER — Other Ambulatory Visit: Payer: Self-pay | Admitting: Obstetrics & Gynecology

## 2022-01-03 ENCOUNTER — Other Ambulatory Visit: Payer: Self-pay | Admitting: Internal Medicine

## 2022-01-03 DIAGNOSIS — G4700 Insomnia, unspecified: Secondary | ICD-10-CM

## 2022-01-03 DIAGNOSIS — F3281 Premenstrual dysphoric disorder: Secondary | ICD-10-CM | POA: Diagnosis not present

## 2022-01-03 DIAGNOSIS — G4733 Obstructive sleep apnea (adult) (pediatric): Secondary | ICD-10-CM | POA: Diagnosis not present

## 2022-01-03 DIAGNOSIS — F3181 Bipolar II disorder: Secondary | ICD-10-CM

## 2022-01-03 DIAGNOSIS — G2589 Other specified extrapyramidal and movement disorders: Secondary | ICD-10-CM

## 2022-01-03 DIAGNOSIS — F639 Impulse disorder, unspecified: Secondary | ICD-10-CM | POA: Diagnosis not present

## 2022-01-03 DIAGNOSIS — F411 Generalized anxiety disorder: Secondary | ICD-10-CM

## 2022-01-03 MED ORDER — BENZTROPINE MESYLATE 0.5 MG PO TABS: 0.5000 mg | ORAL_TABLET | Freq: Every day | ORAL | 0 refills | Status: DC

## 2022-01-03 MED ORDER — OLANZAPINE 5 MG PO TABS: 5.0000 mg | ORAL_TABLET | Freq: Every day | ORAL | 0 refills | Status: DC

## 2022-01-03 NOTE — Progress Notes (Signed)
Sent message, via epic in basket, requesting orders in epic from surgeon.  

## 2022-01-03 NOTE — Telephone Encounter (Signed)
Last AEX 10/03/2020.  Sent message to front desk asking them to schedule AEX for patient.

## 2022-01-03 NOTE — Progress Notes (Signed)
Virtual Visit via Video Note  I connected with Karen Hahn on 01/03/22 at  2:45 PM EDT by a video enabled telemedicine application and verified that I am speaking with the correct person using two identifiers.  Location: Patient: in parked car Provider: office   I discussed the limitations of evaluation and management by telemedicine and the availability of in person appointments. The patient expressed understanding and agreed to proceed.  History of Present Illness: "Doing ok". She has been really irritable and is about to start her menstrual cycle. It is out of her control. She doesn't want to interact with anyone or do anything. Karen Hahn is frustrated with her mood symptoms. She is yelling at everyone all the time.  Her stomach is not well and is work with her GI doctor and PCP. She is feeling more anxious and is having night sweats. She has racing thoughts and is not able to shut down to sleep.She was diagnosed with OSA and is getting her CPAP today.  Karen Hahn denies any new stressors. They are looking for a new house. Karen Hahn is having hernia surgery on 6/23. Her depression is "ok". She denies anhedonia. She denies manic or hypomanic like symptoms. She has poor energy. Appetite is poor due to recent GI upset. Karen Hahn denies SI/HI.    Observations/Objective: Psychiatric Specialty Exam: ROS  There were no vitals taken for this visit.There is no height or weight on file to calculate BMI.  General Appearance: Casual and Neat  Eye Contact:  Good  Speech:  Clear and Coherent and Normal Rate  Volume:  Normal  Mood:  Irritable  Affect:  Full Range  Thought Process:  Goal Directed, Linear, and Descriptions of Associations: Intact  Orientation:  Full (Time, Place, and Person)  Thought Content:  Logical  Suicidal Thoughts:  No  Homicidal Thoughts:  No  Memory:  Immediate;   Good  Judgement:  Good  Insight:  Good  Psychomotor Activity:  Normal  Concentration:  Concentration: Good  Recall:  Good   Fund of Knowledge:  Good  Language:  Good  Akathisia:  No  Handed:  Right  AIMS (if indicated):     Assets:  Communication Skills Desire for Improvement Financial Resources/Insurance Housing Resilience Social Support Talents/Skills Transportation Vocational/Educational  ADL's:  Intact  Cognition:  WNL  Sleep:        Assessment and Plan:     11/29/2021    4:01 PM 11/06/2021    3:38 PM 10/25/2021    4:12 PM 09/27/2021    3:43 PM 07/19/2021    3:51 PM  Depression screen PHQ 2/9  Decreased Interest 0 0  0 0  Down, Depressed, Hopeless 1 0 3 0 1  PHQ - 2 Score 1 0 3 0 1  Altered sleeping 1  0    Tired, decreased energy 3  3    Change in appetite 0  0    Trouble concentrating 3  0    Moving slowly or fidgety/restless 0  0    Suicidal thoughts 0  0    PHQ-9 Score 8  6    Difficult doing work/chores Very difficult  Somewhat difficult      Flowsheet Row Video Visit from 11/29/2021 in [facility] Video Visit from 10/25/2021 in [facility] Video Visit from 09/27/2021 in [place] No Risk No Risk No Risk        Status of current  problems: worsening irritability  Meds:  Start Zyprexa 5mg  po qHS for mood symptoms.   Start Cogentin 0.5mg  po qD to prevent EPS  Will decrease Lamictal if no improvement in irritability. No refill today as she has enough  1. Bipolar II disorder (HCC) - OLANZapine (ZYPREXA) 5 MG tablet; Take 1 tablet (5 mg total) by mouth at bedtime.  Dispense: 30 tablet; Refill: 0  2. PMDD (premenstrual dysphoric disorder) - OLANZapine (ZYPREXA) 5 MG tablet; Take 1 tablet (5 mg total) by mouth at bedtime.  Dispense: 30 tablet; Refill: 0  3. Impulse control disorder  4. Insomnia, unspecified type  5. GAD (generalized anxiety disorder)  6. Combined pyramidal-extrapyramidal syndrome - benztropine (COGENTIN) 0.5 MG  tablet; Take 1 tablet (0.5 mg total) by mouth daily.  Dispense: 30 tablet; Refill: 0      Therapy: brief supportive therapy provided. Discussed psychosocial stressors in detail.      Collaboration of Care: Other none today  Patient/Guardian was advised Release of Information must be obtained prior to any record release in order to collaborate their care with an outside provider. Patient/Guardian was advised if they have not already done so to contact the registration department to sign all necessary forms in order for [place] to release information regarding their care.   Consent: Patient/Guardian gives verbal consent for treatment and assignment of benefits for services provided during this visit. Patient/Guardian expressed understanding and agreed to proceed.    Follow Up Instructions: Follow up in 2-3 weeks or sooner if needed    I discussed the assessment and treatment plan with the patient. The patient was provided an opportunity to ask questions and all were answered. The patient agreed with the plan and demonstrated an understanding of the instructions.   The patient was advised to call back or seek an in-person evaluation if the symptoms worsen or if the condition fails to improve as anticipated.  I provided 22 minutes of non-face-to-face time during this encounter.   Charlcie Cradle, MD

## 2022-01-04 ENCOUNTER — Ambulatory Visit (INDEPENDENT_AMBULATORY_CARE_PROVIDER_SITE_OTHER): Payer: Medicare Other | Admitting: Neurology

## 2022-01-04 ENCOUNTER — Encounter: Payer: Self-pay | Admitting: Neurology

## 2022-01-04 VITALS — BP 98/67 | HR 99 | Ht 62.0 in | Wt 99.0 lb

## 2022-01-04 DIAGNOSIS — G43009 Migraine without aura, not intractable, without status migrainosus: Secondary | ICD-10-CM

## 2022-01-04 MED ORDER — UBRELVY 100 MG PO TABS: 100.0000 mg | ORAL_TABLET | ORAL | 5 refills | Status: DC | PRN

## 2022-01-04 MED ORDER — AIMOVIG 140 MG/ML ~~LOC~~ SOAJ: SUBCUTANEOUS | 5 refills | Status: DC

## 2022-01-04 MED ORDER — AIMOVIG 140 MG/ML ~~LOC~~ SOAJ
SUBCUTANEOUS | 5 refills | Status: DC
Start: 2022-01-04 — End: 2022-01-21

## 2022-01-04 NOTE — Progress Notes (Signed)
NEUROLOGY FOLLOW UP OFFICE NOTE  Karen Hahn [id number]  Assessment/Plan:   Migraine without aura, without status migrainosus, not intractable    Migraine prevention:  Aimovig  refilled Migraine rescue:  rizatriptan and Ubrelvy  Limit use of pain relievers to no more than 2 days out of week to prevent risk of rebound or medication-overuse headache. Keep headache diary Follow up 9 months     Subjective:  Karen Hahn is a 46 year old female with ADD, generalized anxiety disorder, depression and history of concussion and substance abuse whom I see for migraines presents for new issue, "delay of movement".    UPDATE:   Migraines are controlled. Intensity:  Moderate Duration:  2 hours Frequency:  once to twice a week. Current NSAIDS: none Current analgesics:  Percocet  Current triptans:  Maxalt MLT10mg  (rarely takes) Current ergotamine:  none Current anti-emetic:  Promethazine  BID Current muscle relaxants:  none Current anti-anxiolytic:  none Current sleep aide:  none Current Antihypertensive medications:  none Current Antidepressant medications:  none Current Anticonvulsant medications:  Lamotrigine  daily Current anti-CGRP:  Aimovig  monthly; Ubrelvy Current Vitamins/Herbal/Supplements:  D, ferrous gluconate Current Antihistamines/Decongestants:  none Other therapy:  none Hormone/birth control:  none   Caffeine:  1 cup of coffee daily Diet:  Has not been drinking enough water.  Occasional soda. Exercise:  No Depression:  Not currently; Anxiety:  Yes, but manageable Other pain:  no Sleep hygiene:  Diagnosed with OSA.  Just started a CPAP.   HISTORY: No prior history of headache.  No preceding trigger such as recent head trauma, new medication or exposure to new environmentIn July, she had 3 episodes where she woke up with blurred vision for 5 minutes and resolved.  In August 2020, she began experiencing a daily headaches that soon  became a persistent headache that has been gradually increasing in intensity.  They are now severe, throbbing, bitemporal and frontal headaches associated with intermittent photophobia. On a few occasions, she had felt nauseous and vomited.  She reports fatigue.  No fever, chills,unilateral numbness or weakness.  Sometimes closing her eyes help reduce intensity.  She has prior concussion in 2017 but no recent preceding head trauma.  History of depression and anxiety but this has been stable.     She was prescribed rizatriptan and topiramate by her PCP.  Rizatriptan helped a little bit.  Due to potential anorexia, she did not start topiramate.  She was told by radiology that she cannot have a brain MRI due to the interstem implant in her back.   Since around 2017, she frequently feels cold.  She reports that her body (particularly hands and feet) fell cold (internally but also to the touch).  No numbness, tingling, skin discoloration, palpitations, increased/decreased sweating, or lightheadedness.  TSH and B12 from July 2020 were 2.60 and 473 respectively.  To further evaluate sensory changes/cold intolerance, she underwent NCV-EMG on 08/11/2019, which was normal.   In 2021, she began noticing a "delay of movement".  If she wants to tie a trash bag or buckle her seatbelt and she can't do it.  She needs to shake her hands out and it may take up to 5 minutes.  She reports dropping objects.  She also reports difficulty speaking.  When she speaks, it sometimes comes out jumbled.  She also reports long-term and short-term memory problems.  She underwent workup for her cognitive symptoms.  She underwent neuropsychological testing on 06/06/2020.  She gave low performance effort  but overall did not demonstrate any abnormalities to suggest cognitive impairment but did demonstrate findings consistent with ADHD, depression and anxiety.     She also endorsed numbness in her hands.  NCV-EMG of right upper extremity on  01/09/2021 demonstrated right ulnar neuropathy with slowing at the elbow.  She underwent surgery for both.  She then noted numbness in the left hand and forearm.  NCV-EMG of left upper extremity on 02/20/2021 showed evidence of mild carpal tunnel syndrome.      05/19/2019 CTA of head and neck:  Normal.   Past NSAIDS:  Ibuprofen (not effective) Past analgesics:  tramadol Past abortive triptans:  none Past abortive ergotamine:  none Past muscle relaxants:  Flexeril Past anti-emetic:  Reglan, Zofran 8mg  Past antihypertensive medications:  none Past antidepressant/antipsychotic medications:  nortriptyline (photosensitivity), bupropion, Haldol, mirtazapine, Zyprexa, sertraline 200mg  Past anticonvulsant medications:  topiramate (contraindicated due to history of anorexia) Past anti-CGRP:  none Past vitamins/Herbal/Supplements:  none Past antihistamines/decongestants:  Zyrtec Other past therapies:  none     Family history of headache:  Mother (migraines, stopped after CEA for bilateral ICA stenosis.   PAST MEDICAL HISTORY: Past Medical History:  Diagnosis Date   Abdominal hernia    ADD (attention deficit disorder)    Allergy    Anorexia nervosa without bulimia    Chronic constipation    Drug abuse (HCC)    GAD (generalized anxiety disorder)    GERD (gastroesophageal reflux disease)    History of gastric restrictive surgery    History of gastric ulcer    History of Helicobacter pylori infection    after 2003   HLD (hyperlipidemia) 02/16/2018   Impulse control disorder    Moderate major depression (HCC)    Osteopenia    Osteoporosis    Post concussion syndrome    fell 10/20/2015   Sleep apnea    SMAS (superior mesenteric artery syndrome) (HCC)    Ulnar neuropathy    Urge urinary incontinence     MEDICATIONS: Current Outpatient Medications on File Prior to Visit  Medication Sig Dispense Refill   AIMOVIG 140 MG/ML SOAJ ADMINISTER 1 ML UNDER THE SKIN EVERY 30 DAYS 1 mL 2    benztropine (COGENTIN) 0.5 MG tablet Take 1 tablet (0.5 mg total) by mouth daily. 30 tablet 0   bismuth subsalicylate (PEPTO BISMOL) 262 MG/15ML suspension Take 30 mLs by mouth every 6 (six) hours as needed for indigestion or diarrhea or loose stools.     Calcium-Iron-Vit D-Vit K 500-08-998-40 MG-UNT-MCG CHEW Chew 1 each by mouth daily.     dicyclomine (BENTYL) 10 MG capsule Take 1 capsule (10 mg total) by mouth in the morning, at noon, in the evening, and at bedtime. 120 capsule 5   ergocalciferol (VITAMIN D2) 1.25 MG (50000 UT) capsule ergocalciferol (vitamin D2) 1,250 mcg (50,000 unit) capsule  TAKE 1 CAPSULE BY MOUTH EVERY 7 DAYS     ferrous gluconate (FERGON) 324 MG tablet Take 1 tablet (324 mg total) by mouth daily with breakfast. 90 tablet 3   ibandronate (BONIVA) 150 MG tablet Take 150 mg by mouth every 30 (thirty) days.     ibuprofen (ADVIL) 600 MG tablet TAKE 1 TABLET(600 MG) BY MOUTH EVERY 6 HOURS AS NEEDED 60 tablet 0   lamoTRIgine (LAMICTAL) 100 MG tablet Take 1 tablet (100 mg total) by mouth daily. (Patient taking differently: Take 300 mg by mouth daily.) 90 tablet 0   lubiprostone (AMITIZA) 8 MCG capsule TAKE 1 CAPSULE(8 MCG) BY MOUTH TWICE DAILY  WITH A MEAL 60 capsule 6   naproxen (NAPROSYN) 500 MG tablet Take 1 tablet (500 mg total) by mouth 2 (two) times daily as needed. 20 tablet 3   OLANZapine (ZYPREXA) 5 MG tablet Take 1 tablet (5 mg total) by mouth at bedtime. 30 tablet 0   omeprazole (PRILOSEC) 40 MG capsule TAKE 1 CAPSULE(40 MG) BY MOUTH DAILY 90 capsule 2   ondansetron (ZOFRAN-ODT) 4 MG disintegrating tablet Take 1 tablet (4 mg total) by mouth every 8 (eight) hours as needed for nausea or vomiting. 40 tablet 2   oxyCODONE-acetaminophen (PERCOCET/ROXICET) 5-325 MG tablet Take 1 tablet by mouth every 6 (six) hours as needed.     Pediatric Multivitamins-Iron (FLINTSTONES COMPLETE PO) Take 2 tablets by mouth daily.     promethazine (PHENERGAN) 25 MG tablet Take 1 tablet (25 mg  total) by mouth every 6 (six) hours as needed for nausea or vomiting. 90 tablet 5   rizatriptan (MAXALT-MLT) 10 MG disintegrating tablet DISSOLVE 1 TABLET BY MOUTH AS NEEDED FOR MIGRAINE,MAY REPEAT IN 2 HOURS IF NEEDED 10 tablet 5   rosuvastatin (CRESTOR) 20 MG tablet TAKE 1 TABLET(20 MG) BY MOUTH DAILY 90 tablet 3   Ubrogepant (UBRELVY) 100 MG TABS Take 100 mg by mouth as needed (Take 1 tablet at the earlist on set of a Migraine. Max 2 tablets in a 24 hour period). 16 tablet 5   No current facility-administered medications on file prior to visit.    ALLERGIES: Allergies  Allergen Reactions   Bactrim [Sulfamethoxazole-Trimethoprim] Other (See Comments)    Yeast infection   Ciprofloxacin Itching   Penicillins Other (See Comments)    Has patient had a PCN reaction causing immediate rash, facial/tongue/throat swelling, SOB or lightheadedness with hypotension: NO (UTI, YEAST INFECTION) Has patient had a PCN reaction causing severe rash involving mucus membranes or skin necrosis: NO Has patient had a PCN reaction that required hospitalization NO Has patient had a PCN reaction occurring within the last 10 years: NO If all of the above answers are "NO", then may proceed with Cephalosporin use.    Metoclopramide Other (See Comments)   Monistat [Miconazole] Swelling   Morphine And Related Itching and Other (See Comments)    "whole body feels tight feeling"   Benadryl [Diphenhydramine Hcl] Palpitations   Diflucan [Fluconazole] Rash   Diphenhydramine Hcl Palpitations    FAMILY HISTORY: Family History  Problem Relation Age of Onset   Alcohol abuse Mother    Anxiety disorder Mother    Depression Mother        severe with hx of suicide attempt. treated with ECT   COPD Mother    Cancer Mother        Cervical   Stroke Mother    Anxiety disorder Father    Drug abuse Father    COPD Father    Alcohol abuse Maternal Uncle    Anxiety disorder Maternal Grandmother    Depression Maternal  Grandmother    ADD / ADHD Brother    Breast cancer Paternal Grandmother 44   Stomach cancer Neg Hx    Colon cancer Neg Hx    Rectal cancer Neg Hx    Esophageal cancer Neg Hx       Objective:  Blood pressure 98/67, pulse 99, height  (1.575 m), weight 99 lb (44.9 kg), SpO2 98 %. General: No acute distress.  Patient appears well-groomed.   Head:  Normocephalic/atraumatic Eyes:  Fundi examined but not visualized Neck: supple, no paraspinal tenderness, full  range of motion Heart:  Regular rate and rhythm Lungs:  Clear to auscultation bilaterally Back: No paraspinal tenderness Neurological Exam: alert and oriented to person, place, and time.  Speech fluent and not dysarthric, language intact.  CN II-XII intact. Bulk and tone normal, muscle strength 5/5 throughout.  Sensation to light touch intact.  Deep tendon reflexes 2+ throughout, toes downgoing.  Finger to nose testing intact.  Gait normal, Romberg negative.   Shon Millet, DO  CC: Oliver Barre, MD

## 2022-01-04 NOTE — Patient Instructions (Addendum)
Refilled Aimovig and Ubrelvy 

## 2022-01-10 DIAGNOSIS — M545 Low back pain, unspecified: Secondary | ICD-10-CM | POA: Diagnosis not present

## 2022-01-10 NOTE — Progress Notes (Signed)
Requested surgery orders with Adelina Mings at Dr. Guerry Minors office.

## 2022-01-10 NOTE — Progress Notes (Addendum)
COVID Vaccine Completed:  No  Date of COVID positive in last 90 days:   No  PCP - Oliver Barre, MD Cardiologist - N/A Pulmonologist - Fannie Knee, MD  Chest x-ray - N/A EKG - N/A  Stress Test - N/A ECHO - N/A Cardiac Cath -  Pacemaker/ICD device last checked: Spinal Cord Stimulator: Pt has Interstim  Bowel Prep - N/A  Sleep Study - Yes, +sleep apnea CPAP - Yes  Fasting Blood Sugar - N/A Checks Blood Sugar _____ times a day  Blood Thinner Instructions: N/A Aspirin Instructions: Last Dose:  Activity level:   Can go up a flight of stairs and perform activities of daily living without stopping and without symptoms of chest pain or shortness of breath.  Anesthesia review: N/A  Patient denies shortness of breath, fever, cough and chest pain at PAT appointment  Patient verbalized understanding of instructions that were given to them at the PAT appointment. Patient was also instructed that they will need to review over the PAT instructions again at home before surgery.

## 2022-01-10 NOTE — Patient Instructions (Addendum)
DUE TO COVID-19 ONLY TWO VISITORS  (aged 44 and older)  IS ALLOWED TO COME WITH YOU AND STAY IN THE WAITING ROOM ONLY DURING PRE OP AND PROCEDURE.   **NO VISITORS ARE ALLOWED IN THE SHORT STAY AREA OR RECOVERY ROOM!!**  IF YOU WILL BE ADMITTED INTO THE HOSPITAL YOU ARE ALLOWED ONLY FOUR SUPPORT PEOPLE DURING VISITATION HOURS ONLY (7 AM -8PM)   The support person(s) must pass our screening, gel in and out Visitors GUEST BADGE MUST BE WORN VISIBLY  One adult visitor may remain with you overnight and MUST be in the room by 8 P.M.   You are not required to [employer] often Do NOT share personal items Notify your provider if you are in close contact with someone who has COVID or you develop fever 100.4 or greater, new onset of sneezing, cough, sore throat, shortness of breath or body aches.        Your procedure is scheduled on:  01-25-22   Report to [facility] Main Entrance    Report to admitting at 5:15 AM   Call this number if you have problems the morning of surgery [phone]   Do not eat food :After Midnight the night before surgery   After Midnight you may have the following liquids until 4:30 AM DAY OF SURGERY  Clear Liquid Diet Water Black Coffee (sugar ok, NO MILK/CREAM OR CREAMERS)  Tea (sugar ok, NO MILK/CREAM OR CREAMERS) regular and decaf                             Plain Jell-O (NO RED)                                           Fruit ices (not with fruit pulp, NO RED)                                     Popsicles (NO RED)                                                                  Juice: apple, WHITE grape, WHITE cranberry Sports drinks like Gatorade (NO RED) Clear broth(vegetable,chicken,beef)                  The day of surgery:  Drink ONE (1) Pre-Surgery Clear Ensure at 4:30 AM the morning of surgery. Drink in one sitting. Do not sip.  This drink was given to you during your hospital  pre-op appointment visit. Nothing else to drink  after completing the Pre-Surgery Clear Ensure          If you have questions, please contact your surgeon's office.   FOLLOW  ANY ADDITIONAL PRE OP INSTRUCTIONS YOU RECEIVED FROM YOUR SURGEON'S OFFICE!!!     Oral Hygiene is also important to reduce your risk of infection.  Remember - BRUSH YOUR TEETH THE MORNING OF SURGERY WITH YOUR REGULAR TOOTHPASTE   Do NOT smoke after Midnight   Take these medicines the morning of surgery with A SIP OF WATER:  Lamictal, Oxycodone.  Okay to use Zofran, Maxalt, Bernita Raisin if needed   DO NOT BRING YOUR HOME MEDICATIONS TO THE HOSPITAL. PHARMACY WILL DISPENSE MEDICATIONS LISTED ON YOUR MEDICATION LIST TO YOU DURING YOUR ADMISSION IN THE HOSPITAL!  Bring CPAP mask and tubing the day of surgery                              You may not have any metal on your body including hair pins, jewelry, and body piercing             Do not wear make-up, lotions, powders, perfumes or deodorant  Do not wear nail polish including gel and S&S, artificial/acrylic nails, or any other type of covering on natural nails including finger and toenails. If you have artificial nails, gel coating, etc. that needs to be removed by a nail salon please have this removed prior to surgery or surgery may need to be canceled/ delayed if the surgeon/ anesthesia feels like they are unable to be safely monitored.   Do not shave  48 hours prior to surgery.           Contacts, dentures or bridgework may not be worn into surgery.  Do not bring valuables to the hospital.  IS NOT RESPONSIBLE   FOR VALUABLES.   Patients discharged on the day of surgery will not be allowed to drive home.  Someone NEEDS to stay with you for the first 24 hours after anesthesia.  Please read over the following fact sheets you were given: IF YOU HAVE QUESTIONS ABOUT YOUR PRE-OP INSTRUCTIONS PLEASE CALL [phone] [facility] - Preparing for Surgery Before  surgery, you can play an important role.  Because skin is not sterile, your skin needs to be as free of germs as possible.  You can reduce the number of germs on your skin by washing with CHG (chlorahexidine gluconate) soap before surgery.  CHG is an antiseptic cleaner which kills germs and bonds with the skin to continue killing germs even after washing. Please DO NOT use if you have an allergy to CHG or antibacterial soaps.  If your skin becomes reddened/irritated stop using the CHG and inform your nurse when you arrive at Short Stay. Do not shave (including legs and underarms) for at least 48 hours prior to the first CHG shower.  You may shave your face/neck.  Please follow these instructions carefully:  1.  Shower with CHG Soap the night before surgery and the  morning of surgery.  2.  If you choose to wash your hair, wash your hair first as usual with your normal  shampoo.  3.  After you shampoo, rinse your hair and body thoroughly to remove the shampoo.                             4.  Use CHG as you would any other liquid soap.  You can apply chg directly to the skin and wash.  Gently with a scrungie or clean washcloth.  5.  Apply the CHG Soap to your body ONLY FROM THE NECK DOWN.   Do   not use on face/ open  Wound or open sores. Avoid contact with eyes, ears mouth and   genitals (private parts).                       Wash face,  Genitals (private parts) with your normal soap.             6.  Wash thoroughly, paying special attention to the area where your    surgery  will be performed.  7.  Thoroughly rinse your body with warm water from the neck down.  8.  DO NOT shower/wash with your normal soap after using and rinsing off the CHG Soap.                9.  Pat yourself dry with a clean towel.            10.  Wear clean pajamas.            11.  Place clean sheets on your bed the night of your first shower and do not  sleep with pets. Day of Surgery : Do not apply  any lotions/deodorants the morning of surgery.  Please wear clean clothes to the hospital/surgery center.  FAILURE TO FOLLOW THESE INSTRUCTIONS MAY RESULT IN THE CANCELLATION OF YOUR SURGERY  PATIENT SIGNATURE_________________________________  NURSE SIGNATURE__________________________________  ________________________________________________________________________

## 2022-01-11 ENCOUNTER — Ambulatory Visit: Payer: Self-pay | Admitting: General Surgery

## 2022-01-11 ENCOUNTER — Other Ambulatory Visit: Payer: Self-pay

## 2022-01-11 ENCOUNTER — Encounter (HOSPITAL_COMMUNITY)
Admission: RE | Admit: 2022-01-11 | Discharge: 2022-01-11 | Disposition: A | Discharge status: Home / Self Care | Payer: Medicare Other | Source: Ambulatory Visit | Attending: General Surgery | Admitting: General Surgery

## 2022-01-11 ENCOUNTER — Encounter (HOSPITAL_COMMUNITY): Payer: Self-pay

## 2022-01-11 VITALS — BP 111/73 | HR 95 | Temp 98.6°F | Resp 18

## 2022-01-11 DIAGNOSIS — Z01818 Encounter for other preprocedural examination: Secondary | ICD-10-CM | POA: Diagnosis not present

## 2022-01-11 HISTORY — DX: Personal history of other diseases of the nervous system and sense organs: Z86.69

## 2022-01-16 NOTE — Progress Notes (Signed)
HPI F former smoker followed for Mild OSA, complicated by Migraine, Nasal Septal Deviation, IBS/Constipation, GERD, Superior Mesenteric Artery Syndrome, Arthritis, Osteopenia, Rheumatoid Arthritis, Scoliosis, ADHD, BiPolar 2, Insomnia, Anorexia Nervosa, Cannabis, Depression, Anxiety, Gastric Restrictive Surgery, Hyperlipidemia,  HST 11/20/21- AHI 7.5/ hr, desaturation to 92%, body weight 106 lbs  =====================================================  10/16/21- 46 yo F former smoker for sleep evaluation courtesy of Dr Jonny Ruiz with concern of hypersomnolence/ OSA Medical problem list includes Migraine, Nasal Septal Deviation, IBS/Constipation, GERD, Superior Mesenteric Artery Syndrome, Arthritis, Osteopenia, Rheumatoid Arthritis, Scoliosis, ADHD, BiPolar 2, Insomnia Anorexia Nervosa, Cannabis, Depression, Anxiety, Gastric Restrictive Surgery, Hyperlipidemia,  Epworth score-7 Body weight today- Covid vax-no Flu vax-noi Patient states that she talks in her sleep, goes to sleep tired and wakes up tired, Falls asleep sitting up. Is not sure if she snores or stops breathing.  Sleeping alone with no witness.  Says she sometimes falls asleep sitting up.  No sleep medication.  1/2 cup morning coffee.  She doubts her current medications are significantly contributing to daytime sleepiness.  Denies cataplexy or sleep paralysis.  Denies family history of similar sleep problem. Frequent bronchitis and rhinitis as a child with history of deviated septum.  Says respiratory problems have been much less troublesome as an adult. Indicates bedtime between 10 PM and midnight with 30 minutes sleep latency, waking twice for bathroom before up between 6 AM and 7 AM.  Usually no deliberate naps.  01/17/22- 43 yoF former smoker followed for Mild OSA, complicated by Migraine, Nasal Septal Deviation, IBS/Constipation, GERD, Superior Mesenteric Artery Syndrome, Arthritis, Osteopenia, Rheumatoid Arthritis, Scoliosis, ADHD, BiPolar 2,  Insomnia, Anorexia Nervosa, Cannabis, Depression, Anxiety, Gastric Restrictive Surgery, Hyperlipidemia,  HST 11/20/21- AHI 7.5/ hr, desaturation to 92%, body weight 106 lbs CPAP auto 5-15/ [employer] Download compliance-compliance 27%, AHI 11.3/ hr Body weight today-104 lbs Covid vax- no Has only had CPAP about 2 weeks. Mask too big, comes off in sleep. She is getting some kind of error message on the machine about the heater. ENT Dr. Irene Limbo offered turbinate reduction surgery  ROS-see HPI   + = positive Constitutional:    weight loss, night sweats, fevers, chills, fatigue, lassitude. HEENT:    +headaches, difficulty swallowing, +tooth/dental problems, sore throat,       sneezing, itching, ear ache, nasal congestion, post nasal drip, snoring CV:    chest pain, orthopnea, PND, swelling in lower extremities, anasarca,                                   dizziness, palpitations Resp:   shortness of breath with exertion or at rest.                productive cough,   non-productive cough, coughing up of blood.              change in color of mucus.  wheezing.   Skin:    rash or lesions. GI: +heartburn, indigestion, +abdominal pain, nausea, vomiting, diarrhea,                 change in bowel habits, loss of appetite GU: dysuria, change in color of urine, no urgency or frequency.   flank pain. MS:   j+oint pain, stiffness, decreased range of motion, back pain. Neuro-     nothing unusual Psych:  change in mood or affect.  +depression or +anxiety.   memory loss.  OBJ- Physical Exam General- Alert, Oriented, Affect-appropriate,  Distress- none acute, +petite/ slender Skin- rash-none, lesions- none, excoriation- none Lymphadenopathy- none Head- atraumatic            Eyes- Gross vision intact, PERRLA, conjunctivae and secretions clear            Ears- Hearing, canals-normal            Nose- Clear, no-Septal dev, mucus, polyps, erosion, perforation             Throat- Mallampati III , mucosa clear ,  drainage- none, tonsils- atrophic, +teeth Neck- flexible , trachea midline, no stridor , thyroid nl, carotid no bruit Chest - symmetrical excursion , unlabored           Heart/CV- RRR , no murmur , no gallop  , no rub, nl s1 s2                           - JVD- none , edema- none, stasis changes- none, varices- none           Lung- clear to P&A, wheeze- none, cough- none , dullness-none, rub- none           Chest wall-  Abd-  Br/ Gen/ Rectal- Not done, not indicated Extrem- cyanosis- none, clubbing, none, atrophy- none, strength- nl Neuro- grossly intact to observation

## 2022-01-17 ENCOUNTER — Ambulatory Visit (INDEPENDENT_AMBULATORY_CARE_PROVIDER_SITE_OTHER): Payer: Medicare Other | Admitting: Internal Medicine

## 2022-01-17 ENCOUNTER — Encounter: Payer: Self-pay | Admitting: Internal Medicine

## 2022-01-17 VITALS — BP 100/60 | HR 78 | Temp 98.2°F | Ht 62.0 in | Wt 104.2 lb

## 2022-01-17 DIAGNOSIS — G4710 Hypersomnia, unspecified: Secondary | ICD-10-CM

## 2022-01-17 DIAGNOSIS — G4733 Obstructive sleep apnea (adult) (pediatric): Secondary | ICD-10-CM

## 2022-01-17 NOTE — Patient Instructions (Addendum)
Order- DME [employer]- please refit mask of choice for better seal- current one may be too big.            Please address heating issue error message she is getting on her machine            Continue CPAP auto 5-15

## 2022-01-20 ENCOUNTER — Encounter: Payer: Self-pay | Admitting: Internal Medicine

## 2022-01-21 ENCOUNTER — Other Ambulatory Visit: Payer: Self-pay

## 2022-01-21 ENCOUNTER — Encounter: Payer: Self-pay | Admitting: Neurology

## 2022-01-21 MED ORDER — IBUPROFEN 600 MG PO TABS: ORAL_TABLET | ORAL | 0 refills | Status: DC

## 2022-01-21 MED ORDER — AIMOVIG 140 MG/ML ~~LOC~~ SOAJ: SUBCUTANEOUS | 5 refills | Status: DC

## 2022-01-21 NOTE — Telephone Encounter (Signed)
Mychart message received from Patient, need refill for Aimovig 140 mg. Per patient [facility] never received it on 01/04/22.   Refill resent.

## 2022-01-22 ENCOUNTER — Other Ambulatory Visit: Payer: Self-pay

## 2022-01-22 DIAGNOSIS — M545 Low back pain, unspecified: Secondary | ICD-10-CM | POA: Diagnosis not present

## 2022-01-22 MED ORDER — RIZATRIPTAN BENZOATE 10 MG PO TBDP
ORAL_TABLET | ORAL | 5 refills | Status: DC
Start: 2022-01-22 — End: 2022-07-16

## 2022-01-22 MED ORDER — UBRELVY 100 MG PO TABS: 100.0000 mg | ORAL_TABLET | ORAL | 5 refills | Status: AC | PRN

## 2022-01-24 ENCOUNTER — Telehealth (HOSPITAL_BASED_OUTPATIENT_CLINIC_OR_DEPARTMENT_OTHER): Payer: Medicare Other | Admitting: Psychiatry

## 2022-01-24 DIAGNOSIS — F3181 Bipolar II disorder: Secondary | ICD-10-CM | POA: Diagnosis not present

## 2022-01-24 DIAGNOSIS — F3281 Premenstrual dysphoric disorder: Secondary | ICD-10-CM

## 2022-01-24 DIAGNOSIS — F639 Impulse disorder, unspecified: Secondary | ICD-10-CM

## 2022-01-24 DIAGNOSIS — F411 Generalized anxiety disorder: Secondary | ICD-10-CM

## 2022-01-24 DIAGNOSIS — G4700 Insomnia, unspecified: Secondary | ICD-10-CM

## 2022-01-24 DIAGNOSIS — G2589 Other specified extrapyramidal and movement disorders: Secondary | ICD-10-CM

## 2022-01-24 DIAGNOSIS — F900 Attention-deficit hyperactivity disorder, predominantly inattentive type: Secondary | ICD-10-CM

## 2022-01-24 MED ORDER — OLANZAPINE 10 MG PO TABS: ORAL_TABLET | ORAL | 0 refills | Status: DC

## 2022-01-24 NOTE — Progress Notes (Signed)
Virtual Visit via Video Note  I connected with [name] on 01/24/22 at  8:45 AM EDT by a video enabled telemedicine application and verified that I am speaking with the correct person using two identifiers.  Location: Patient: home Provider: office   I discussed the limitations of evaluation and management by telemedicine and the availability of in person appointments. The patient expressed understanding and agreed to proceed.  History of Present Illness: Karen Hahn has been taking Zyprexa since June 1st. She is tolerating it well and denies SE. She is eating and sleeping well. She denies depression. Karen Hahn continues to have irritability around her period. Last week she was less irritable and thinks it is due to the Zyprexa. This week she is more irritable and thinks her menstrual cycle is about to start. She denies any increase in anxiety. She denies any impulsive behaviors. She denies any other hypomanic or manic like symptoms. Karen Hahn denies SI/HI.    Observations/Objective: Psychiatric Specialty Exam: ROS  Last menstrual period 12/10/2021.There is no height or weight on file to calculate BMI.  General Appearance: Casual  Eye Contact:  Good  Speech:  Clear and Coherent and Normal Rate  Volume:  Normal  Mood:  Irritable  Affect:  Full Range  Thought Process:  Goal Directed, Linear, and Descriptions of Associations: Intact  Orientation:  Full (Time, Place, and Person)  Thought Content:  Logical  Suicidal Thoughts:  No  Homicidal Thoughts:  No  Memory:  Immediate;   Good  Judgement:  Good  Insight:  Good  Psychomotor Activity:  Normal  Concentration:  Concentration: Good  Recall:  Good  Fund of Knowledge:  Good  Language:  Good  Akathisia:  No  Handed:  Right  AIMS (if indicated):     Assets:  Communication Skills Desire for Improvement Financial Resources/Insurance Housing Resilience Social Support Talents/Skills Transportation Vocational/Educational  ADL's:  Intact   Cognition:  WNL  Sleep:        Assessment and Plan:     11/29/2021    4:01 PM 11/06/2021    3:38 PM 10/25/2021    4:12 PM 09/27/2021    3:43 PM 07/19/2021    3:51 PM  Depression screen PHQ 2/9  Decreased Interest 0 0  0 0  Down, Depressed, Hopeless 1 0 3 0 1  PHQ - 2 Score 1 0 3 0 1  Altered sleeping 1  0    Tired, decreased energy 3  3    Change in appetite 0  0    Trouble concentrating 3  0    Moving slowly or fidgety/restless 0  0    Suicidal thoughts 0  0    PHQ-9 Score 8  6    Difficult doing work/chores Very difficult  Somewhat difficult      Flowsheet Row Video Visit from 01/24/2022 in [facility] Pre-Admission Testing 62 from 01[facility]11/2022 in [facility] Video Visit from 11/29/2021 in [facility]  C-SSRS RISK CATEGORY No Risk No Risk No Risk          Status of current problems: ongoing irritability  Meds:  Continue - Lamictal 300mg  qD Cogentin 0.5mg  qD  Increase Zyprexa to 10mg  po qHS on days when she is experiencing worsening irritability that is usually associated with her menstrual cycle starting. I advised her to take 5mg  of Zyprexa other day when her irritability is lower.  1. PMDD (premenstrual dysphoric disorder) - OLANZapine (ZYPREXA) 10  MG tablet; Take 5mg  (1/2 tab) - 1 tab po qHs depending on level of irritability  Dispense: 90 tablet; Refill: 0  2. Bipolar II disorder (HCC) - OLANZapine (ZYPREXA) 10 MG tablet; Take 5mg  (1/2 tab) - 1 tab po qHs depending on level of irritability  Dispense: 90 tablet; Refill: 0  3. Impulse control disorder  4. Insomnia, unspecified type  5. GAD (generalized anxiety disorder)  6. Attention deficit hyperactivity disorder (ADHD), predominantly inattentive type  7. Combined pyramidal-extrapyramidal syndrome     Labs: none today    Therapy: brief supportive therapy provided.    Collaboration of  Care: Other none  Patient/Guardian was advised Release of Information must be obtained prior to any record release in order to collaborate their care with an outside provider. Patient/Guardian was advised if they have not already done so to contact the registration department to sign all necessary forms in order for to release information regarding their care.   Consent: Patient/Guardian gives verbal consent for treatment and assignment of benefits for services provided during this visit. Patient/Guardian expressed understanding and agreed to proceed.     Follow Up Instructions: Follow up in 1-2 months or sooner if needed    I discussed the assessment and treatment plan with the patient. The patient was provided an opportunity to ask questions and all were answered. The patient agreed with the plan and demonstrated an understanding of the instructions.   The patient was advised to call back or seek an in-person evaluation if the symptoms worsen or if the condition fails to improve as anticipated.  I provided 9 minutes of non-face-to-face time during this encounter.   , MD

## 2022-01-25 ENCOUNTER — Encounter (HOSPITAL_COMMUNITY)
Admission: RE | Disposition: A | Discharge status: Home / Self Care | Payer: Self-pay | Source: Ambulatory Visit | Attending: General Surgery

## 2022-01-25 ENCOUNTER — Ambulatory Visit (HOSPITAL_COMMUNITY): Payer: Medicare Other | Admitting: Certified Registered Nurse Anesthetist

## 2022-01-25 ENCOUNTER — Encounter (HOSPITAL_COMMUNITY): Payer: Self-pay | Admitting: General Surgery

## 2022-01-25 ENCOUNTER — Ambulatory Visit (HOSPITAL_COMMUNITY)
Admission: RE | Admit: 2022-01-25 | Discharge: 2022-01-25 | Disposition: A | Discharge status: Home / Self Care | Payer: Medicare Other | Source: Ambulatory Visit | Attending: General Surgery | Admitting: General Surgery

## 2022-01-25 ENCOUNTER — Ambulatory Visit (HOSPITAL_BASED_OUTPATIENT_CLINIC_OR_DEPARTMENT_OTHER): Payer: Medicare Other | Admitting: Certified Registered Nurse Anesthetist

## 2022-01-25 DIAGNOSIS — D649 Anemia, unspecified: Secondary | ICD-10-CM | POA: Diagnosis not present

## 2022-01-25 DIAGNOSIS — Z87891 Personal history of nicotine dependence: Secondary | ICD-10-CM | POA: Insufficient documentation

## 2022-01-25 DIAGNOSIS — K432 Incisional hernia without obstruction or gangrene: Secondary | ICD-10-CM

## 2022-01-25 DIAGNOSIS — K219 Gastro-esophageal reflux disease without esophagitis: Secondary | ICD-10-CM | POA: Diagnosis not present

## 2022-01-25 DIAGNOSIS — J449 Chronic obstructive pulmonary disease, unspecified: Secondary | ICD-10-CM | POA: Insufficient documentation

## 2022-01-25 DIAGNOSIS — Z01818 Encounter for other preprocedural examination: Secondary | ICD-10-CM

## 2022-01-25 DIAGNOSIS — F419 Anxiety disorder, unspecified: Secondary | ICD-10-CM | POA: Insufficient documentation

## 2022-01-25 DIAGNOSIS — M1990 Unspecified osteoarthritis, unspecified site: Secondary | ICD-10-CM | POA: Diagnosis not present

## 2022-01-25 DIAGNOSIS — F32A Depression, unspecified: Secondary | ICD-10-CM | POA: Insufficient documentation

## 2022-01-25 DIAGNOSIS — Z934 Other artificial openings of gastrointestinal tract status: Secondary | ICD-10-CM | POA: Diagnosis not present

## 2022-01-25 HISTORY — PX: INCISIONAL HERNIA REPAIR: SHX193

## 2022-01-25 LAB — PREGNANCY, URINE: Preg Test, Ur: NEGATIVE

## 2022-01-25 SURGERY — REPAIR, HERNIA, INCISIONAL
Anesthesia: General

## 2022-01-25 MED ORDER — DEXAMETHASONE SODIUM PHOSPHATE 10 MG/ML IJ SOLN
INTRAMUSCULAR | Status: AC
  Filled 2022-01-25: qty 1

## 2022-01-25 MED ORDER — PROPOFOL 10 MG/ML IV BOLUS
INTRAVENOUS | Status: AC
  Filled 2022-01-25: qty 20

## 2022-01-25 MED ORDER — BUPIVACAINE LIPOSOME 1.3 % IJ SUSP
INTRAMUSCULAR | Status: AC
Start: 2022-01-25 — End: ?
  Filled 2022-01-25: qty 20

## 2022-01-25 MED ORDER — FENTANYL CITRATE PF 50 MCG/ML IJ SOSY
PREFILLED_SYRINGE | INTRAMUSCULAR | Status: AC
  Filled 2022-01-25: qty 1

## 2022-01-25 MED ORDER — ORAL CARE MOUTH RINSE
15.0000 mL | Freq: Once | OROMUCOSAL | Status: AC
Start: 2022-01-25 — End: 2022-01-25

## 2022-01-25 MED ORDER — ACETAMINOPHEN 500 MG PO TABS: 1000.0000 mg | ORAL_TABLET | ORAL | Status: DC

## 2022-01-25 MED ORDER — ENSURE PRE-SURGERY PO LIQD: 296.0000 mL | Freq: Once | ORAL | Status: DC

## 2022-01-25 MED ORDER — ACETAMINOPHEN 500 MG PO TABS
1000.0000 mg | ORAL_TABLET | Freq: Once | ORAL | Status: AC
  Administered 2022-01-25: 1000 mg via ORAL
  Filled 2022-01-25: qty 2

## 2022-01-25 MED ORDER — CHLORHEXIDINE GLUCONATE CLOTH 2 % EX PADS: 6.0000 | MEDICATED_PAD | Freq: Once | CUTANEOUS | Status: DC

## 2022-01-25 MED ORDER — OXYCODONE HCL 5 MG PO TABS: 5.0000 mg | ORAL_TABLET | Freq: Once | ORAL | Status: DC | PRN

## 2022-01-25 MED ORDER — BUPIVACAINE LIPOSOME 1.3 % IJ SUSP
INTRAMUSCULAR | Status: DC | PRN
  Administered 2022-01-25: 20 mL

## 2022-01-25 MED ORDER — CELECOXIB 200 MG PO CAPS
200.0000 mg | ORAL_CAPSULE | Freq: Once | ORAL | Status: AC
  Administered 2022-01-25: 200 mg via ORAL
  Filled 2022-01-25: qty 1

## 2022-01-25 MED ORDER — 0.9 % SODIUM CHLORIDE (POUR BTL) OPTIME
TOPICAL | Status: DC | PRN
  Administered 2022-01-25: 1000 mL

## 2022-01-25 MED ORDER — KETOROLAC TROMETHAMINE 30 MG/ML IJ SOLN
INTRAMUSCULAR | Status: AC
Start: 2022-01-25 — End: ?
  Filled 2022-01-25: qty 1

## 2022-01-25 MED ORDER — ACETAMINOPHEN 325 MG PO TABS: 325.0000 mg | ORAL_TABLET | ORAL | Status: DC | PRN

## 2022-01-25 MED ORDER — FENTANYL CITRATE (PF) 250 MCG/5ML IJ SOLN
INTRAMUSCULAR | Status: DC | PRN
  Administered 2022-01-25 (×2): 50 ug via INTRAVENOUS

## 2022-01-25 MED ORDER — KETOROLAC TROMETHAMINE 15 MG/ML IJ SOLN
15.0000 mg | INTRAMUSCULAR | Status: AC
  Administered 2022-01-25: 30 mg via INTRAVENOUS
  Filled 2022-01-25: qty 1

## 2022-01-25 MED ORDER — OXYCODONE HCL 5 MG/5ML PO SOLN: 5.0000 mg | Freq: Once | ORAL | Status: DC | PRN

## 2022-01-25 MED ORDER — OXYCODONE HCL 5 MG PO TABS: 5.0000 mg | ORAL_TABLET | Freq: Four times a day (QID) | ORAL | 0 refills | Status: DC | PRN

## 2022-01-25 MED ORDER — LABETALOL HCL 5 MG/ML IV SOLN
INTRAVENOUS | Status: AC
  Filled 2022-01-25: qty 4

## 2022-01-25 MED ORDER — FENTANYL CITRATE (PF) 100 MCG/2ML IJ SOLN
INTRAMUSCULAR | Status: AC
  Filled 2022-01-25: qty 2

## 2022-01-25 MED ORDER — DEXMEDETOMIDINE HCL IN NACL 80 MCG/20ML IV SOLN
INTRAVENOUS | Status: AC
Start: 2022-01-25 — End: ?
  Filled 2022-01-25: qty 20

## 2022-01-25 MED ORDER — LACTATED RINGERS IV SOLN: INTRAVENOUS | Status: DC

## 2022-01-25 MED ORDER — IBUPROFEN 800 MG PO TABS: 800.0000 mg | ORAL_TABLET | Freq: Three times a day (TID) | ORAL | 0 refills | Status: DC | PRN

## 2022-01-25 MED ORDER — ONDANSETRON HCL 4 MG/2ML IJ SOLN
INTRAMUSCULAR | Status: AC
  Filled 2022-01-25: qty 2

## 2022-01-25 MED ORDER — ONDANSETRON HCL 4 MG/2ML IJ SOLN
4.0000 mg | Freq: Once | INTRAMUSCULAR | Status: AC | PRN
Start: 2022-01-25 — End: 2022-01-25
  Administered 2022-01-25: 4 mg via INTRAVENOUS

## 2022-01-25 MED ORDER — ROCURONIUM BROMIDE 10 MG/ML (PF) SYRINGE
PREFILLED_SYRINGE | INTRAVENOUS | Status: DC | PRN
  Administered 2022-01-25: 50 mg via INTRAVENOUS

## 2022-01-25 MED ORDER — MIDAZOLAM HCL 5 MG/5ML IJ SOLN
INTRAMUSCULAR | Status: DC | PRN
  Administered 2022-01-25: 2 mg via INTRAVENOUS

## 2022-01-25 MED ORDER — BUPIVACAINE LIPOSOME 1.3 % IJ SUSP: 20.0000 mL | Freq: Once | INTRAMUSCULAR | Status: DC

## 2022-01-25 MED ORDER — ACETAMINOPHEN 160 MG/5ML PO SOLN: 325.0000 mg | ORAL | Status: DC | PRN

## 2022-01-25 MED ORDER — CEFAZOLIN SODIUM-DEXTROSE 2-4 GM/100ML-% IV SOLN
2.0000 g | INTRAVENOUS | Status: AC
  Administered 2022-01-25: 2 g via INTRAVENOUS
  Filled 2022-01-25: qty 100

## 2022-01-25 MED ORDER — FENTANYL CITRATE PF 50 MCG/ML IJ SOSY
25.0000 ug | PREFILLED_SYRINGE | INTRAMUSCULAR | Status: DC | PRN
  Administered 2022-01-25 (×2): 25 ug via INTRAVENOUS
  Administered 2022-01-25: 50 ug via INTRAVENOUS

## 2022-01-25 MED ORDER — PROPOFOL 10 MG/ML IV BOLUS
INTRAVENOUS | Status: DC | PRN
  Administered 2022-01-25: 150 mg via INTRAVENOUS

## 2022-01-25 MED ORDER — MEPERIDINE HCL 50 MG/ML IJ SOLN: 6.2500 mg | INTRAMUSCULAR | Status: DC | PRN

## 2022-01-25 MED ORDER — DEXMEDETOMIDINE (PRECEDEX) IN NS 20 MCG/5ML (4 MCG/ML) IV SYRINGE
PREFILLED_SYRINGE | INTRAVENOUS | Status: DC | PRN
  Administered 2022-01-25 (×2): 4 ug via INTRAVENOUS
  Administered 2022-01-25: 12 ug via INTRAVENOUS

## 2022-01-25 MED ORDER — BUPIVACAINE-EPINEPHRINE (PF) 0.25% -1:200000 IJ SOLN
INTRAMUSCULAR | Status: AC
  Filled 2022-01-25: qty 30

## 2022-01-25 MED ORDER — ONDANSETRON HCL 4 MG/2ML IJ SOLN
INTRAMUSCULAR | Status: DC | PRN
  Administered 2022-01-25: 4 mg via INTRAVENOUS

## 2022-01-25 MED ORDER — DEXAMETHASONE SODIUM PHOSPHATE 10 MG/ML IJ SOLN
INTRAMUSCULAR | Status: DC | PRN
  Administered 2022-01-25: 10 mg via INTRAVENOUS

## 2022-01-25 MED ORDER — LABETALOL HCL 5 MG/ML IV SOLN
10.0000 mg | Freq: Once | INTRAVENOUS | Status: AC
  Administered 2022-01-25: 10 mg via INTRAVENOUS

## 2022-01-25 MED ORDER — LIDOCAINE 2% (20 MG/ML) 5 ML SYRINGE
INTRAMUSCULAR | Status: DC | PRN
  Administered 2022-01-25: 100 mg via INTRAVENOUS

## 2022-01-25 MED ORDER — MIDAZOLAM HCL 2 MG/2ML IJ SOLN
INTRAMUSCULAR | Status: AC
  Filled 2022-01-25: qty 2

## 2022-01-25 MED ORDER — PHENYLEPHRINE HCL-NACL 20-0.9 MG/250ML-% IV SOLN
INTRAVENOUS | Status: DC | PRN
  Administered 2022-01-25: 35 ug/min via INTRAVENOUS

## 2022-01-25 MED ORDER — LIDOCAINE HCL (PF) 2 % IJ SOLN
INTRAMUSCULAR | Status: AC
  Filled 2022-01-25: qty 5

## 2022-01-25 MED ORDER — BUPIVACAINE-EPINEPHRINE 0.25% -1:200000 IJ SOLN
INTRAMUSCULAR | Status: DC | PRN
  Administered 2022-01-25: 30 mL

## 2022-01-25 MED ORDER — ROCURONIUM BROMIDE 10 MG/ML (PF) SYRINGE
PREFILLED_SYRINGE | INTRAVENOUS | Status: AC
  Filled 2022-01-25: qty 10

## 2022-01-25 MED ORDER — SUGAMMADEX SODIUM 200 MG/2ML IV SOLN
INTRAVENOUS | Status: DC | PRN
  Administered 2022-01-25: 200 mg via INTRAVENOUS

## 2022-01-25 MED ORDER — CHLORHEXIDINE GLUCONATE 0.12 % MT SOLN
15.0000 mL | Freq: Once | OROMUCOSAL | Status: AC
  Administered 2022-01-25: 15 mL via OROMUCOSAL

## 2022-01-25 SURGICAL SUPPLY — 39 items
BAG COUNTER SPONGE SURGICOUNT (BAG) IMPLANT
BENZOIN TINCTURE PRP APPL 2/3 (GAUZE/BANDAGES/DRESSINGS) IMPLANT
CHLORAPREP W/TINT 26 (MISCELLANEOUS) ×2 IMPLANT
COVER SURGICAL LIGHT HANDLE (MISCELLANEOUS) ×2 IMPLANT
DERMABOND ADVANCED (GAUZE/BANDAGES/DRESSINGS) ×1
DERMABOND ADVANCED .7 DNX12 (GAUZE/BANDAGES/DRESSINGS) IMPLANT
DRAIN CHANNEL 19F RND (DRAIN) IMPLANT
DRAPE LAPAROSCOPIC ABDOMINAL (DRAPES) ×2 IMPLANT
DRSG TELFA 4X10 ISLAND STR (GAUZE/BANDAGES/DRESSINGS) IMPLANT
DRSG TELFA 4X8 ISLAND (GAUZE/BANDAGES/DRESSINGS) IMPLANT
ELECT REM PT RETURN 15FT ADLT (MISCELLANEOUS) ×2 IMPLANT
EVACUATOR SILICONE 100CC (DRAIN) IMPLANT
GLOVE BIOGEL PI IND STRL 7.0 (GLOVE) ×1 IMPLANT
GLOVE BIOGEL PI INDICATOR 7.0 (GLOVE) ×1
GLOVE SURG SS PI 7.0 STRL IVOR (GLOVE) ×2 IMPLANT
GOWN STRL REUS W/ TWL LRG LVL3 (GOWN DISPOSABLE) ×1 IMPLANT
GOWN STRL REUS W/ TWL XL LVL3 (GOWN DISPOSABLE) IMPLANT
GOWN STRL REUS W/TWL LRG LVL3 (GOWN DISPOSABLE) ×1
GOWN STRL REUS W/TWL XL LVL3 (GOWN DISPOSABLE)
KIT BASIN OR (CUSTOM PROCEDURE TRAY) ×2 IMPLANT
KIT TURNOVER KIT A (KITS) IMPLANT
NEEDLE HYPO 22GX1.5 SAFETY (NEEDLE) ×2 IMPLANT
PACK GENERAL/GYN (CUSTOM PROCEDURE TRAY) ×2 IMPLANT
SPIKE FLUID TRANSFER (MISCELLANEOUS) ×2 IMPLANT
SPONGE DRAIN TRACH 4X4 STRL 2S (GAUZE/BANDAGES/DRESSINGS) IMPLANT
STRIP CLOSURE SKIN 1/2X4 (GAUZE/BANDAGES/DRESSINGS) IMPLANT
SUT ETHILON 2 0 PS N (SUTURE) IMPLANT
SUT MNCRL AB 4-0 PS2 18 (SUTURE) ×2 IMPLANT
SUT NOVA 0 T19/GS 22DT (SUTURE) IMPLANT
SUT NOVA NAB GS-21 0 18 T12 DT (SUTURE) IMPLANT
SUT PDS AB 0 CT1 36 (SUTURE) ×4 IMPLANT
SUT VIC AB 2-0 CT1 27 (SUTURE)
SUT VIC AB 2-0 CT1 TAPERPNT 27 (SUTURE) IMPLANT
SUT VIC AB 3-0 SH 18 (SUTURE) ×2 IMPLANT
SUT VIC AB 3-0 SH 27 (SUTURE)
SUT VIC AB 3-0 SH 27XBRD (SUTURE) IMPLANT
SYR 20ML LL LF (SYRINGE) ×2 IMPLANT
TOWEL OR 17X26 10 PK STRL BLUE (TOWEL DISPOSABLE) ×2 IMPLANT
TOWEL OR NON WOVEN STRL DISP B (DISPOSABLE) ×2 IMPLANT

## 2022-01-25 NOTE — Transfer of Care (Signed)
Immediate Anesthesia Transfer of Care Note  Patient: Karen Hahn  Procedure(s) Performed: OPEN INCISIONAL HERNIA REPAIR  Patient Location: PACU  Anesthesia Type:General  Level of Consciousness: awake, alert  and oriented  Airway & Oxygen Therapy: Patient Spontanous Breathing and Patient connected to face mask oxygen  Post-op Assessment: Report given to RN and Post -op Vital signs reviewed and stable  Post vital signs: Reviewed and stable  Last Vitals:  Vitals Value Taken Time  BP 114/68 01/25/22 0836  Temp 36.5 C 01/25/22 0836  Pulse 100 01/25/22 0837  Resp 9 01/25/22 0837  SpO2 100 % 01/25/22 0837  Vitals shown include unvalidated device data.  Last Pain:  Vitals:   01/25/22 0606  TempSrc: Oral  PainSc:       Patients Stated Pain Goal: 6 (01/25/22 0547)  Complications: No notable events documented.

## 2022-01-26 ENCOUNTER — Encounter (HOSPITAL_COMMUNITY): Payer: Self-pay | Admitting: General Surgery

## 2022-01-26 ENCOUNTER — Other Ambulatory Visit (HOSPITAL_COMMUNITY): Payer: Self-pay | Admitting: Psychiatry

## 2022-01-26 DIAGNOSIS — G2589 Other specified extrapyramidal and movement disorders: Secondary | ICD-10-CM

## 2022-01-29 DIAGNOSIS — M545 Low back pain, unspecified: Secondary | ICD-10-CM | POA: Diagnosis not present

## 2022-01-29 DIAGNOSIS — M5136 Other intervertebral disc degeneration, lumbar region: Secondary | ICD-10-CM | POA: Diagnosis not present

## 2022-01-30 ENCOUNTER — Other Ambulatory Visit (HOSPITAL_COMMUNITY): Payer: Self-pay | Admitting: Psychiatry

## 2022-01-30 DIAGNOSIS — F3281 Premenstrual dysphoric disorder: Secondary | ICD-10-CM

## 2022-01-30 DIAGNOSIS — F3181 Bipolar II disorder: Secondary | ICD-10-CM

## 2022-02-02 DIAGNOSIS — G4733 Obstructive sleep apnea (adult) (pediatric): Secondary | ICD-10-CM | POA: Diagnosis not present

## 2022-02-12 ENCOUNTER — Other Ambulatory Visit: Payer: Self-pay

## 2022-02-12 DIAGNOSIS — M546 Pain in thoracic spine: Secondary | ICD-10-CM

## 2022-02-15 DIAGNOSIS — K432 Incisional hernia without obstruction or gangrene: Secondary | ICD-10-CM | POA: Diagnosis not present

## 2022-02-20 ENCOUNTER — Encounter: Payer: Self-pay | Admitting: Internal Medicine

## 2022-02-20 ENCOUNTER — Ambulatory Visit
Admission: RE | Admit: 2022-02-20 | Discharge: 2022-02-20 | Disposition: A | Discharge status: Home / Self Care | Payer: Medicare Other | Source: Ambulatory Visit

## 2022-02-20 DIAGNOSIS — M546 Pain in thoracic spine: Secondary | ICD-10-CM

## 2022-02-20 DIAGNOSIS — M40204 Unspecified kyphosis, thoracic region: Secondary | ICD-10-CM | POA: Diagnosis not present

## 2022-02-20 DIAGNOSIS — G4733 Obstructive sleep apnea (adult) (pediatric): Secondary | ICD-10-CM | POA: Insufficient documentation

## 2022-02-20 NOTE — Assessment & Plan Note (Signed)
We are treating for the sleep apnea component but recognize depression and associated therapies may also contribute to hypersomnia.

## 2022-02-20 NOTE — Assessment & Plan Note (Signed)
She needs help with get settled in with her CPAP but I think she will do okay. Plan-DME to address error message about heater.  DME to refit mask.  Continue auto 5-15

## 2022-02-27 ENCOUNTER — Encounter: Payer: Self-pay | Admitting: Internal Medicine

## 2022-02-27 ENCOUNTER — Ambulatory Visit (INDEPENDENT_AMBULATORY_CARE_PROVIDER_SITE_OTHER): Payer: Medicare Other | Admitting: Internal Medicine

## 2022-02-27 VITALS — BP 98/56 | HR 85 | Temp 98.1°F | Ht 62.0 in | Wt 104.2 lb

## 2022-02-27 DIAGNOSIS — E7800 Pure hypercholesterolemia, unspecified: Secondary | ICD-10-CM

## 2022-02-27 DIAGNOSIS — E559 Vitamin D deficiency, unspecified: Secondary | ICD-10-CM | POA: Diagnosis not present

## 2022-02-27 DIAGNOSIS — G8929 Other chronic pain: Secondary | ICD-10-CM

## 2022-02-27 DIAGNOSIS — R739 Hyperglycemia, unspecified: Secondary | ICD-10-CM

## 2022-02-27 DIAGNOSIS — M545 Low back pain, unspecified: Secondary | ICD-10-CM

## 2022-02-27 DIAGNOSIS — E538 Deficiency of other specified B group vitamins: Secondary | ICD-10-CM

## 2022-02-27 NOTE — Patient Instructions (Signed)
Please continue all other medications as before, and refills have been done if requested.  Please have the pharmacy call with any other refills you may need.  Please continue your efforts at being more active, low cholesterol diet, and weight control  Please keep your appointments with your specialists as you may have planned - pain medicine  Please make an Appointment to return in 6 months, or sooner if needed, also with Lab Appointment for testing done 3-5 days before at the FIRST FLOOR Lab (so this is for TWO appointments - please see the scheduling desk as you leave

## 2022-02-27 NOTE — Progress Notes (Unsigned)
Patient ID: Karen Hahn, female   DOB: 1975/09/16, 46 y.o.   MRN: [id number]        Chief Complaint: follow up low vit d, HLD and hyperglycemia        HPI:  Karen Hahn is a 46 y.o. female here overall doing ok, Pt denies chest pain, increased sob or doe, wheezing, orthopnea, PND, increased LE swelling, palpitations, dizziness or syncope.   Pt denies polydipsia, polyuria, or new focal neuro s/s.    Pt denies fever, wt loss, night sweats, loss of appetite, or other constitutional symptoms  Does have ongoing mid thoracic back pain, recent CT thoracic with mild degenerative change only.  Not taking Vit D       Wt Readings from Last 3 Encounters:  02/27/22 104 lb 3.2 oz (47.3 kg)  01/25/22 104 lb 4.4 oz (47.3 kg)  01/17/22 104 lb 3.2 oz (47.3 kg)   BP Readings from Last 3 Encounters:  02/27/22 (!) 98/56  01/25/22 112/68  01/17/22 100/60         Past Medical History:  Diagnosis Date   Abdominal hernia    ADD (attention deficit disorder)    Allergy    Anorexia nervosa without bulimia    Chronic constipation    Drug abuse (HCC)    GAD (generalized anxiety disorder)    GERD (gastroesophageal reflux disease)    History of gastric restrictive surgery    History of gastric ulcer    History of Helicobacter pylori infection    after 2003   HLD (hyperlipidemia) 02/16/2018   Hx of migraine headaches    Impulse control disorder    Moderate major depression (HCC)    Osteopenia    Osteoporosis    Post concussion syndrome    fell 10/20/2015   Sleep apnea    SMAS (superior mesenteric artery syndrome) (HCC)    Ulnar neuropathy    Urge urinary incontinence    Past Surgical History:  Procedure Laterality Date   CARPAL TUNNEL RELEASE Right    Ulnar repair   DILATION AND CURETTAGE OF UTERUS  08/06/2003   INCISIONAL HERNIA REPAIR N/A 01/25/2022   Procedure: OPEN INCISIONAL HERNIA REPAIR;  Surgeon: Sheliah Hatch De Blanch, MD;  Location: [facility];  Service: General;  Laterality: N/A;    INTERSTIM IMPLANT PLACEMENT N/A 10/18/2015   Procedure: [name] IMPLANT FIRST STAGE;  Surgeon: Jerilee Field, MD;  Location: [facility];  Service: Urology;  Laterality: N/A;   INTERSTIM IMPLANT PLACEMENT N/A 10/18/2015   Procedure: [name] IMPLANT SECOND STAGE;  Surgeon: Jerilee Field, MD;  Location: [facility];  Service: Urology;  Laterality: N/A;   LAPAROSCOPIC GASTRIC RESTRICTIVE DUODENAL PROCEDURE (DUODENAL SWITCH)  07/05/2002   ORIF RIGHT WRIST FX  08/06/2011   REMOVAL HARDWARE X2 in  2013--  No retained hardware    reports that she quit smoking about 5 years ago. Her smoking use included cigarettes. She has a 0.50 pack-year smoking history. She has never used smokeless tobacco. She reports that she does not currently use drugs after having used the following drugs: Marijuana. She reports that she does not drink alcohol. family history includes ADD / ADHD in her brother; Alcohol abuse in her maternal uncle and mother; Anxiety disorder in her father, maternal grandmother, and mother; Breast cancer (age of onset: 75) in her paternal grandmother; COPD in her father and mother; Cancer in her mother; Depression in her maternal grandmother and mother; Drug abuse in her father; Stroke in her  mother. Allergies  Allergen Reactions   Bactrim [Sulfamethoxazole-Trimethoprim] Other (See Comments)    Yeast infection   Ciprofloxacin Itching   Penicillins Other (See Comments)    Has patient had a PCN reaction causing immediate rash, facial/tongue/throat swelling, SOB or lightheadedness with hypotension: NO (UTI, YEAST INFECTION) Has patient had a PCN reaction causing severe rash involving mucus membranes or skin necrosis: NO Has patient had a PCN reaction that required hospitalization NO Has patient had a PCN reaction occurring within the last 10 years: NO If all of the above answers are "NO", then may proceed with Cephalosporin use.    Monistat [Miconazole]  Swelling   Morphine And Related Itching and Other (See Comments)    "whole body feels tight feeling"   Reglan [Metoclopramide] Other (See Comments)    tachycardia   Benadryl [Diphenhydramine Hcl] Palpitations    tachycardia   Diflucan [Fluconazole] Rash   Current Outpatient Medications on File Prior to Visit  Medication Sig Dispense Refill   benztropine (COGENTIN) 0.5 MG tablet TAKE 1 TABLET BY MOUTH EVERY DAY 90 tablet 0   bismuth subsalicylate (PEPTO BISMOL) 262 MG chewable tablet Chew 524 mg by mouth 4 (four) times daily as needed for diarrhea or loose stools or indigestion.     dicyclomine (BENTYL) 10 MG capsule Take 1 capsule (10 mg total) by mouth in the morning, at noon, in the evening, and at bedtime. 120 capsule 5   Erenumab-aooe (AIMOVIG) 140 MG/ML SOAJ ADMINISTER 1 ML UNDER THE SKIN EVERY 30 DAYS 1 mL 5   ibandronate (BONIVA) 150 MG tablet TAKE 1 TABLET EVERY 30 DAYS WITH FULL GLASS OF WATER ON EMPTY STOMACH. NOTHING BY MOUTH FOR 30 MINS 3 tablet 0   ibuprofen (ADVIL) 800 MG tablet Take 1 tablet (800 mg total) by mouth every 8 (eight) hours as needed. 30 tablet 0   lamoTRIgine (LAMICTAL) 150 MG tablet Take 300 mg by mouth in the morning.     lubiprostone (AMITIZA) 8 MCG capsule TAKE 1 CAPSULE(8 MCG) BY MOUTH TWICE DAILY WITH A MEAL 60 capsule 6   OLANZapine (ZYPREXA) 10 MG tablet Take  (1/2 tab) - 1 tab po qHs depending on level of irritability 90 tablet 0   ondansetron (ZOFRAN) 8 MG tablet Take 8 mg by mouth every 8 (eight) hours as needed for nausea or vomiting.     ondansetron (ZOFRAN-ODT) 4 MG disintegrating tablet Take 1 tablet (4 mg total) by mouth every 8 (eight) hours as needed for nausea or vomiting. 40 tablet 2   oxyCODONE (OXY IR/ROXICODONE) 5 MG immediate release tablet Take 1 tablet (5 mg total) by mouth every 6 (six) hours as needed for severe pain. 15 tablet 0   promethazine (PHENERGAN) 25 MG tablet Take 1 tablet (25 mg total) by mouth every 6 (six) hours as needed  for nausea or vomiting. (Patient taking differently: Take 25 mg by mouth See admin instructions. Take 1 tablet (25 mg) by mouth in the morning & take 1 tablet (25 mg) by mouth before supper (scheduled). May take up to two additional doses if needed for nausea/vomiting.) 90 tablet 5   rizatriptan (MAXALT-MLT) 10 MG disintegrating tablet DISSOLVE 1 TABLET BY MOUTH AS NEEDED FOR MIGRAINE,MAY REPEAT IN 2 HOURS IF NEEDED 10 tablet 5   rosuvastatin (CRESTOR) 20 MG tablet TAKE 1 TABLET(20 MG) BY MOUTH DAILY (Patient taking differently: Take 20 mg by mouth every evening.) 90 tablet 3   Ubrogepant (UBRELVY) 100 MG TABS Take 100 mg by mouth  as needed (Take 1 tablet at the earlist on set of a Migraine. Max 2 tablets in a 24 hour period). 16 tablet 5   No current facility-administered medications on file prior to visit.        ROS:  All others reviewed and negative.  Objective        PE:  BP (!) 98/56 (BP Location: Right Arm, Patient Position: Sitting, Cuff Size: Normal)   Pulse 85   Temp 98.1 F (36.7 C) (Oral)   Ht  (1.575 m)   Wt 104 lb 3.2 oz (47.3 kg)   SpO2 97%   BMI 19.06 kg/m                 Constitutional: Pt appears in NAD               HENT: Head: NCAT.                Right Ear: External ear normal.                 Left Ear: External ear normal.                Eyes: . Pupils are equal, round, and reactive to light. Conjunctivae and EOM are normal               Nose: without d/c or deformity               Neck: Neck supple. Gross normal ROM               Cardiovascular: Normal rate and regular rhythm.                 Pulmonary/Chest: Effort normal and breath sounds without rales or wheezing.                Abd:  Soft, NT, ND, + BS, no organomegaly               Neurological: Pt is alert. At baseline orientation, motor grossly intact               Skin: Skin is warm. No rashes, no other new lesions, LE edema - none               Psychiatric: Pt behavior is normal without agitation    Micro: none  Cardiac tracings I have personally interpreted today:  none  Pertinent Radiological findings (summarize): none   Lab Results  Component Value Date   WBC 6.0 11/13/2021   HGB 12.9 11/13/2021   HCT 37.6 11/13/2021   PLT 303.0 11/13/2021   GLUCOSE 91 11/13/2021   CHOL 197 11/13/2021   TRIG 96.0 11/13/2021   HDL 59.30 11/13/2021   LDLCALC 118 (H) 11/13/2021   ALT 13 11/13/2021   AST 15 11/13/2021   NA 140 11/13/2021   K 3.9 11/13/2021   CL 104 11/13/2021   CREATININE 0.71 11/13/2021   BUN 14 11/13/2021   CO2 28 11/13/2021   TSH 0.90 11/13/2021   HGBA1C 5.3 11/13/2021   Assessment/Plan:  Karen Hahn is a 46 y.o. White or Caucasian [1] female with  has a past medical history of Abdominal hernia, ADD (attention deficit disorder), Allergy, Anorexia nervosa without bulimia, Chronic constipation, Drug abuse (HCC), GAD (generalized anxiety disorder), GERD (gastroesophageal reflux disease), History of gastric restrictive surgery, History of gastric ulcer, History of Helicobacter pylori infection, HLD (hyperlipidemia) (02/16/2018), migraine headaches, Impulse control disorder, Moderate major depression (HCC),  Osteopenia, Osteoporosis, Post concussion syndrome, Sleep apnea, SMAS (superior mesenteric artery syndrome) (HCC), Ulnar neuropathy, and Urge urinary incontinence.  Vitamin D deficiency Last vitamin D Lab Results  Component Value Date   VD25OH 18.23 (L) 11/13/2021   Low, to start oral replacement   Hyperglycemia Lab Results  Component Value Date   HGBA1C 5.3 11/13/2021   Stable, pt to continue current medical treatment  - diet, wt control   HLD (hyperlipidemia) Lab Results  Component Value Date   LDLCALC 118 (H) 11/13/2021   uncontrolled, pt to restart crestor 20 mg qd   Chronic low back pain Recent CT thoracic negative, ? FMS, consider cymbalta or gabapentin  Followup: Return in about 6 months (around 08/30/2022).  Oliver Barre, MD 02/28/2022  9:45 PM [facility]

## 2022-02-28 ENCOUNTER — Encounter: Payer: Self-pay | Admitting: Internal Medicine

## 2022-02-28 DIAGNOSIS — Z79899 Other long term (current) drug therapy: Secondary | ICD-10-CM | POA: Diagnosis not present

## 2022-02-28 DIAGNOSIS — M546 Pain in thoracic spine: Secondary | ICD-10-CM | POA: Diagnosis not present

## 2022-02-28 DIAGNOSIS — Z5181 Encounter for therapeutic drug level monitoring: Secondary | ICD-10-CM | POA: Diagnosis not present

## 2022-02-28 DIAGNOSIS — M47814 Spondylosis without myelopathy or radiculopathy, thoracic region: Secondary | ICD-10-CM | POA: Diagnosis not present

## 2022-02-28 DIAGNOSIS — M5136 Other intervertebral disc degeneration, lumbar region: Secondary | ICD-10-CM | POA: Diagnosis not present

## 2022-02-28 NOTE — Assessment & Plan Note (Signed)
Last vitamin D Lab Results  Component Value Date   VD25OH 18.23 (L) 11/13/2021   Low, to start oral replacement  

## 2022-02-28 NOTE — Assessment & Plan Note (Signed)
Lab Results  Component Value Date   LDLCALC 118 (H) 11/13/2021   uncontrolled, pt to restart crestor 20 mg qd

## 2022-02-28 NOTE — Assessment & Plan Note (Signed)
Recent CT thoracic negative, ? FMS, consider cymbalta or gabapentin

## 2022-02-28 NOTE — Assessment & Plan Note (Signed)
Lab Results  Component Value Date   HGBA1C 5.3 11/13/2021   Stable, pt to continue current medical treatment  - diet, wt control  

## 2022-03-05 DIAGNOSIS — G4733 Obstructive sleep apnea (adult) (pediatric): Secondary | ICD-10-CM | POA: Diagnosis not present

## 2022-03-11 NOTE — Progress Notes (Deleted)
GYNECOLOGY  VISIT   HPI: 46 y.o.   Divorced  Caucasian  female   G2P0020 with No LMP recorded.   here to discuss CT scan results.    GYNECOLOGIC HISTORY: No LMP recorded. Contraception:  ***Abstinence Menopausal hormone therapy:  none Last mammogram:  *** Last pap smear:  09-19-17 Neg:Neg HR HPV, 11-08-13 Neg        OB History     Gravida  2   Para      Term      Preterm      AB  2   Living         SAB  2   IAB      Ectopic      Multiple      Live Births                 Patient Active Problem List   Diagnosis Date Noted   OSA (obstructive sleep apnea)    Pain in thoracic spine 02/12/2022   Chronic low back pain 11/01/2021   Degeneration of lumbar intervertebral disc 11/01/2021   Deviated nasal septum 08/29/2021   Hypersomnolence 08/29/2021   Incisional hernia, without obstruction or gangrene 05/19/2021   Periumbilical abdominal pain 05/19/2021   Ulnar neuropathy 03/05/2021   Cellulitis of right leg 03/05/2021   Ulnar nerve entrapment at elbow 02/12/2021   Cellulitis of great toe, right 02/08/2021   Carpal tunnel syndrome of right wrist 11/24/2020   Gastroesophageal reflux disease 10/26/2020   Inflammatory arthritis 10/26/2020   Scoliosis 10/26/2020   Urinary frequency 09/27/2020   COVID 08/31/2020   Weight loss 02/21/2020   Encounter for orthopedic follow-up care 12/09/2019   Rheumatoid arthritis (HCC) 11/24/2019   Right wrist pain 07/29/2019   Right hand pain 07/29/2019   Intractable headache 04/14/2019   Hyperglycemia 02/18/2019   HLD (hyperlipidemia) 02/16/2018   Migraine 02/16/2018   Encounter for well adult exam with abnormal findings 05/21/2017   UTI (urinary tract infection) 05/21/2017   Thrush 05/21/2017   Vitamin D deficiency 05/21/2017   Osteopenia 05/21/2017   Dyspnea 05/21/2017   Hematuria 01/01/2017   Lump in female breast 01/01/2017   Fatigue 01/01/2017   Candida vaginitis 01/01/2017   Dysuria 12/19/2016    Gastroesophageal reflux disease with esophagitis 11/14/2016   Irritable bowel syndrome with constipation 11/14/2016   Severe episode of recurrent major depressive disorder, without psychotic features (HCC) 08/24/2015   Cough 11/15/2014   Major depressive disorder, recurrent episode, moderate (HCC) 07/14/2014   ADD (attention deficit hyperactivity disorder, inattentive type) 07/14/2014   Impulse control disorder 07/14/2014   Pain of upper abdomen 05/09/2014   Depression 03/08/2014   GAD (generalized anxiety disorder) 03/08/2014   Anorexia nervosa 11/17/2013   Impulse control disorder, unspecified 11/17/2013   Cannabis abuse 11/17/2013   Insomnia 11/17/2013    Past Medical History:  Diagnosis Date   Abdominal hernia    ADD (attention deficit disorder)    Allergy    Anorexia nervosa without bulimia    Chronic constipation    Drug abuse (HCC)    GAD (generalized anxiety disorder)    GERD (gastroesophageal reflux disease)    History of gastric restrictive surgery    History of gastric ulcer    History of Helicobacter pylori infection    after 2003   HLD (hyperlipidemia) 02/16/2018   Hx of migraine headaches    Impulse control disorder    Moderate major depression (HCC)    Osteopenia    Osteoporosis  Post concussion syndrome    fell 10/20/2015   Sleep apnea    SMAS (superior mesenteric artery syndrome) (HCC)    Ulnar neuropathy    Urge urinary incontinence     Past Surgical History:  Procedure Laterality Date   CARPAL TUNNEL RELEASE Right    Ulnar repair   DILATION AND CURETTAGE OF UTERUS  08/06/2003   INCISIONAL HERNIA REPAIR N/A 01/25/2022   Procedure: OPEN INCISIONAL HERNIA REPAIR;  Surgeon: Sheliah Hatch De Blanch, MD;  Location: [facility];  Service: General;  Laterality: N/A;   INTERSTIM IMPLANT PLACEMENT N/A 10/18/2015   Procedure: [name] IMPLANT FIRST STAGE;  Surgeon: Jerilee Field, MD;  Location: [facility];  Service: Urology;  Laterality: N/A;    INTERSTIM IMPLANT PLACEMENT N/A 10/18/2015   Procedure: [name] IMPLANT SECOND STAGE;  Surgeon: Jerilee Field, MD;  Location: [facility];  Service: Urology;  Laterality: N/A;   LAPAROSCOPIC GASTRIC RESTRICTIVE DUODENAL PROCEDURE (DUODENAL SWITCH)  07/05/2002   ORIF RIGHT WRIST FX  08/06/2011   REMOVAL HARDWARE X2 in  2013--  No retained hardware    Current Outpatient Medications  Medication Sig Dispense Refill   benztropine (COGENTIN) 0.5 MG tablet TAKE 1 TABLET BY MOUTH EVERY DAY 90 tablet 0   bismuth subsalicylate (PEPTO BISMOL) 262 MG chewable tablet Chew 524 mg by mouth 4 (four) times daily as needed for diarrhea or loose stools or indigestion.     dicyclomine (BENTYL) 10 MG capsule Take 1 capsule (10 mg total) by mouth in the morning, at noon, in the evening, and at bedtime. 120 capsule 5   Erenumab-aooe (AIMOVIG) 140 MG/ML SOAJ ADMINISTER 1 ML UNDER THE SKIN EVERY 30 DAYS 1 mL 5   ibandronate (BONIVA) 150 MG tablet TAKE 1 TABLET EVERY 30 DAYS WITH FULL GLASS OF WATER ON EMPTY STOMACH. NOTHING BY MOUTH FOR 30 MINS 3 tablet 0   ibuprofen (ADVIL) 800 MG tablet Take 1 tablet (800 mg total) by mouth every 8 (eight) hours as needed. 30 tablet 0   lamoTRIgine (LAMICTAL) 150 MG tablet Take 300 mg by mouth in the morning.     lubiprostone (AMITIZA) 8 MCG capsule TAKE 1 CAPSULE(8 MCG) BY MOUTH TWICE DAILY WITH A MEAL 60 capsule 6   OLANZapine (ZYPREXA) 10 MG tablet Take 5mg  (1/2 tab) - 1 tab po qHs depending on level of irritability 90 tablet 0   ondansetron (ZOFRAN) 8 MG tablet Take 8 mg by mouth every 8 (eight) hours as needed for nausea or vomiting.     ondansetron (ZOFRAN-ODT) 4 MG disintegrating tablet Take 1 tablet (4 mg total) by mouth every 8 (eight) hours as needed for nausea or vomiting. 40 tablet 2   oxyCODONE (OXY IR/ROXICODONE) 5 MG immediate release tablet Take 1 tablet (5 mg total) by mouth every 6 (six) hours as needed for severe pain. 15 tablet 0    promethazine (PHENERGAN) 25 MG tablet Take 1 tablet (25 mg total) by mouth every 6 (six) hours as needed for nausea or vomiting. (Patient taking differently: Take 25 mg by mouth See admin instructions. Take 1 tablet (25 mg) by mouth in the morning & take 1 tablet (25 mg) by mouth before supper (scheduled). May take up to two additional doses if needed for nausea/vomiting.) 90 tablet 5   rizatriptan (MAXALT-MLT) 10 MG disintegrating tablet DISSOLVE 1 TABLET BY MOUTH AS NEEDED FOR MIGRAINE,MAY REPEAT IN 2 HOURS IF NEEDED 10 tablet 5   rosuvastatin (CRESTOR) 20 MG tablet TAKE 1 TABLET(20  MG) BY MOUTH DAILY (Patient taking differently: Take 20 mg by mouth every evening.) 90 tablet 3   Ubrogepant (UBRELVY) 100 MG TABS Take 100 mg by mouth as needed (Take 1 tablet at the earlist on set of a Migraine. Max 2 tablets in a 24 hour period). 16 tablet 5   No current facility-administered medications for this visit.     ALLERGIES: Bactrim [sulfamethoxazole-trimethoprim], Ciprofloxacin, Penicillins, Monistat [miconazole], Morphine and related, Reglan [metoclopramide], Benadryl [diphenhydramine hcl], and Diflucan [fluconazole]  Family History  Problem Relation Age of Onset   Alcohol abuse Mother    Anxiety disorder Mother    Depression Mother        severe with hx of suicide attempt. treated with ECT   COPD Mother    Cancer Mother        Cervical   Stroke Mother    Anxiety disorder Father    Drug abuse Father    COPD Father    Alcohol abuse Maternal Uncle    Anxiety disorder Maternal Grandmother    Depression Maternal Grandmother    ADD / ADHD Brother    Breast cancer Paternal Grandmother 17   Stomach cancer Neg Hx    Colon cancer Neg Hx    Rectal cancer Neg Hx    Esophageal cancer Neg Hx     Social History   Socioeconomic History   Marital status: Divorced    Spouse name: Not on file   Number of children: 0   Years of education: 14   Highest education level: Not on file  Occupational  History   Occupation: Disability  Tobacco Use   Smoking status: Former    Packs/day: 0.50    Years: 1.00    Total pack years: 0.50    Types: Cigarettes    Quit date: 12/03/2016    Years since quitting: 5.2   Smokeless tobacco: Never   Tobacco comments:    quit smoking 8 months ago as of 10/15/18  Vaping Use   Vaping Use: Never used  Substance and Sexual Activity   Alcohol use: No   Drug use: Not Currently    Types: Marijuana    Comment: last use 3 years ago Marijuana   Sexual activity: Not Currently    Comment: 1st intercourse 46 yo-Fewer than 5 partners  Other Topics Concern   Not on file  Social History Narrative   Fun: Play piano; Build Lego   Denies abuse and feels safe at home. Living with friend in [place].    Right handed    Drink coffee 1 cup eveyday   Leave with 2 friends   1 story home, 16 steps   Social Determinants of Health   Financial Resource Strain: Low Risk  (11/06/2021)   Overall Financial Resource Strain (CARDIA)    Difficulty of Paying Living Expenses: Not hard at all  Food Insecurity: No Food Insecurity (11/06/2021)   Hunger Vital Sign    Worried About Running Out of Food in the Last Year: Never true    Ran Out of Food in the Last Year: Never true  Transportation Needs: No Transportation Needs (11/06/2021)   PRAPARE - Administrator, Civil Service (Medical): No    Lack of Transportation (Non-Medical): No  Physical Activity: Inactive (11/06/2021)   Exercise Vital Sign    Days of Exercise per Week: 0 days    Minutes of Exercise per Session: 0 min  Stress: No Stress Concern Present (11/06/2021)   [employer]  of Occupational Health - Occupational Stress Questionnaire    Feeling of Stress : Not at all  Social Connections: Unknown (11/06/2021)   Social Connection and Isolation Panel [NHANES]    Frequency of Communication with Friends and Family: More than three times a week    Frequency of Social Gatherings with Friends and Family: More  than three times a week    Attends Religious Services: Patient refused    Active Member of Clubs or Organizations: Patient refused    Attends Banker Meetings: Patient refused    Marital Status: Divorced  Catering manager Violence: Not At Risk (11/06/2021)   Humiliation, Afraid, Rape, and Kick questionnaire    Fear of Current or Ex-Partner: No    Emotionally Abused: No    Physically Abused: No    Sexually Abused: No    Review of Systems  PHYSICAL EXAMINATION:    There were no vitals taken for this visit.    General appearance: alert, cooperative and appears stated age Head: Normocephalic, without obvious abnormality, atraumatic Neck: no adenopathy, supple, symmetrical, trachea midline and thyroid normal to inspection and palpation Lungs: clear to auscultation bilaterally Breasts: normal appearance, no masses or tenderness, No nipple retraction or dimpling, No nipple discharge or bleeding, No axillary or supraclavicular adenopathy Heart: regular rate and rhythm Abdomen: soft, non-tender, no masses,  no organomegaly Extremities: extremities normal, atraumatic, no cyanosis or edema Skin: Skin color, texture, turgor normal. No rashes or lesions Lymph nodes: Cervical, supraclavicular, and axillary nodes normal. No abnormal inguinal nodes palpated Neurologic: Grossly normal  Pelvic: External genitalia:  no lesions              Urethra:  normal appearing urethra with no masses, tenderness or lesions              Bartholins and Skenes: normal                 Vagina: normal appearing vagina with normal color and discharge, no lesions              Cervix: no lesions                Bimanual Exam:  Uterus:  normal size, contour, position, consistency, mobility, non-tender              Adnexa: no mass, fullness, tenderness              Rectal exam: {yes no:[id number]}.  Confirms.              Anus:  normal sphincter tone, no lesions  Chaperone was present for exam:   ***  ASSESSMENT     PLAN     An After Visit Summary was printed and given to the patient.  ______ minutes face to face time of which over 50% was spent in counseling.

## 2022-03-12 ENCOUNTER — Ambulatory Visit (INDEPENDENT_AMBULATORY_CARE_PROVIDER_SITE_OTHER): Payer: Medicare Other | Admitting: Obstetrics and Gynecology

## 2022-03-12 ENCOUNTER — Encounter: Payer: Self-pay | Admitting: Obstetrics and Gynecology

## 2022-03-12 ENCOUNTER — Ambulatory Visit: Payer: Medicare Other | Admitting: Obstetrics and Gynecology

## 2022-03-12 VITALS — BP 100/66 | Ht 62.75 in | Wt 105.0 lb

## 2022-03-12 DIAGNOSIS — M546 Pain in thoracic spine: Secondary | ICD-10-CM

## 2022-03-12 DIAGNOSIS — G8929 Other chronic pain: Secondary | ICD-10-CM

## 2022-03-12 DIAGNOSIS — M8589 Other specified disorders of bone density and structure, multiple sites: Secondary | ICD-10-CM

## 2022-03-12 NOTE — Progress Notes (Signed)
GYNECOLOGY  VISIT   HPI: 46 y.o.   Divorced  Caucasian  female   G2P0020 with Patient's last menstrual period was 02/18/2022 (approximate).   here to discuss CT scan results. PCP has referred to a thoracic surgeon/specialist.   Patient is asking if she needs a bone density.  Hx osteopenia for years.  Had a one time bone density at [facility].  Did not have treatment with Reclast.  Her last BMD showed osteopenia of the hips and spine.  FRAX model:  Major fracture risk 5.9% and hip fracture risk 1.8%.   Narrative & Impression  CLINICAL DATA:  Mid back pain radiating to the lower back for 1 year   EXAM: CT THORACIC SPINE WITHOUT CONTRAST   TECHNIQUE: Multidetector CT images of the thoracic were obtained using the standard protocol without intravenous contrast.   RADIATION DOSE REDUCTION: This exam was performed according to the departmental dose-optimization program which includes automated exposure control, adjustment of the mA and/or kV according to patient size and/or use of iterative reconstruction technique.   COMPARISON:  None Available.   FINDINGS: Alignment: Exaggerated thoracic kyphosis. No listhesis or vertebral body wedging.   Vertebrae: No acute fracture or focal pathologic process.   Paraspinal and other soft tissues: Negative for perispinal mass or inflammation.   Disc levels: Mild lower thoracic degenerative endplate irregularity and minor spurring. A small left paracentral herniation is seen at T8-9. Negative facets and no bony foraminal impingement.   IMPRESSION: Mid and lower thoracic disc degeneration without bony impingement or bone lesion.     Electronically Signed   By: Tiburcio Pea M.D.   On: 02/20/2022 19:02      GYNECOLOGIC HISTORY: Patient's last menstrual period was 02/18/2022 (approximate). Contraception:  Abstinence Menopausal hormone therapy:  none Last mammogram:  06-20-21  diag.Bil/Bil.US/Neg/Birads1 Last pap smear:  09-19-17 Neg:Neg HR HPV, 11-08-13 Neg               OB History     Gravida  2   Para      Term      Preterm      AB  2   Living         SAB  2   IAB      Ectopic      Multiple      Live Births                 Patient Active Problem List   Diagnosis Date Noted   OSA (obstructive sleep apnea)    Pain in thoracic spine 02/12/2022   Chronic low back pain 11/01/2021   Degeneration of lumbar intervertebral disc 11/01/2021   Deviated nasal septum 08/29/2021   Hypersomnolence 08/29/2021   Incisional hernia, without obstruction or gangrene 05/19/2021   Periumbilical abdominal pain 05/19/2021   Ulnar neuropathy 03/05/2021   Cellulitis of right leg 03/05/2021   Ulnar nerve entrapment at elbow 02/12/2021   Cellulitis of great toe, right 02/08/2021   Carpal tunnel syndrome of right wrist 11/24/2020   Gastroesophageal reflux disease 10/26/2020   Inflammatory arthritis 10/26/2020   Scoliosis 10/26/2020   Urinary frequency 09/27/2020   COVID 08/31/2020   Weight loss 02/21/2020   Encounter for orthopedic follow-up care 12/09/2019   Rheumatoid arthritis (HCC) 11/24/2019   Right wrist pain 07/29/2019   Right hand pain 07/29/2019   Intractable headache 04/14/2019   Hyperglycemia 02/18/2019   HLD (hyperlipidemia) 02/16/2018   Migraine 02/16/2018   Encounter for well  adult exam with abnormal findings 05/21/2017   UTI (urinary tract infection) 05/21/2017   Thrush 05/21/2017   Vitamin D deficiency 05/21/2017   Osteopenia 05/21/2017   Dyspnea 05/21/2017   Hematuria 01/01/2017   Lump in female breast 01/01/2017   Fatigue 01/01/2017   Candida vaginitis 01/01/2017   Dysuria 12/19/2016   Gastroesophageal reflux disease with esophagitis 11/14/2016   Irritable bowel syndrome with constipation 11/14/2016   Severe episode of recurrent major depressive disorder, without psychotic features (HCC) 08/24/2015   Cough 11/15/2014    Major depressive disorder, recurrent episode, moderate (HCC) 07/14/2014   ADD (attention deficit hyperactivity disorder, inattentive type) 07/14/2014   Impulse control disorder 07/14/2014   Pain of upper abdomen 05/09/2014   Depression 03/08/2014   GAD (generalized anxiety disorder) 03/08/2014   Anorexia nervosa 11/17/2013   Impulse control disorder, unspecified 11/17/2013   Cannabis abuse 11/17/2013   Insomnia 11/17/2013    Past Medical History:  Diagnosis Date   Abdominal hernia    ADD (attention deficit disorder)    Allergy    Anorexia nervosa without bulimia    Chronic constipation    Drug abuse (HCC)    GAD (generalized anxiety disorder)    GERD (gastroesophageal reflux disease)    History of gastric restrictive surgery    History of gastric ulcer    History of Helicobacter pylori infection    after 2003   HLD (hyperlipidemia) 02/16/2018   Hx of migraine headaches    Impulse control disorder    Moderate major depression (HCC)    Osteopenia    Osteoporosis    Post concussion syndrome    fell 10/20/2015   Sleep apnea    SMAS (superior mesenteric artery syndrome) (HCC)    Ulnar neuropathy    Urge urinary incontinence     Past Surgical History:  Procedure Laterality Date   CARPAL TUNNEL RELEASE Right    Ulnar repair   DILATION AND CURETTAGE OF UTERUS  08/06/2003   INCISIONAL HERNIA REPAIR N/A 01/25/2022   Procedure: OPEN INCISIONAL HERNIA REPAIR;  Surgeon: Sheliah Hatch De Blanch, MD;  Location: [facility];  Service: General;  Laterality: N/A;   INTERSTIM IMPLANT PLACEMENT N/A 10/18/2015   Procedure: [name] IMPLANT FIRST STAGE;  Surgeon: Jerilee Field, MD;  Location: [facility];  Service: Urology;  Laterality: N/A;   INTERSTIM IMPLANT PLACEMENT N/A 10/18/2015   Procedure: [name] IMPLANT SECOND STAGE;  Surgeon: Jerilee Field, MD;  Location: [facility];  Service: Urology;  Laterality: N/A;   LAPAROSCOPIC GASTRIC RESTRICTIVE  DUODENAL PROCEDURE (DUODENAL SWITCH)  07/05/2002   ORIF RIGHT WRIST FX  08/06/2011   REMOVAL HARDWARE X2 in  2013--  No retained hardware    Current Outpatient Medications  Medication Sig Dispense Refill   benztropine (COGENTIN) 0.5 MG tablet TAKE 1 TABLET BY MOUTH EVERY DAY 90 tablet 0   bismuth subsalicylate (PEPTO BISMOL) 262 MG chewable tablet Chew 524 mg by mouth 4 (four) times daily as needed for diarrhea or loose stools or indigestion.     dicyclomine (BENTYL) 10 MG capsule Take 1 capsule (10 mg total) by mouth in the morning, at noon, in the evening, and at bedtime. 120 capsule 5   Erenumab-aooe (AIMOVIG) 140 MG/ML SOAJ ADMINISTER 1 ML UNDER THE SKIN EVERY 30 DAYS 1 mL 5   lamoTRIgine (LAMICTAL) 150 MG tablet Take 300 mg by mouth in the morning.     OLANZapine (ZYPREXA) 10 MG tablet Take 5mg  (1/2 tab) - 1 tab po qHs  depending on level of irritability 90 tablet 0   ondansetron (ZOFRAN) 8 MG tablet Take 8 mg by mouth every 8 (eight) hours as needed for nausea or vomiting.     oxyCODONE-acetaminophen (PERCOCET/ROXICET) 5-325 MG tablet Take 1 tablet by mouth 3 (three) times daily as needed.     promethazine (PHENERGAN) 25 MG tablet Take 1 tablet (25 mg total) by mouth every 6 (six) hours as needed for nausea or vomiting. (Patient taking differently: Take 25 mg by mouth See admin instructions. Take 1 tablet (25 mg) by mouth in the morning & take 1 tablet (25 mg) by mouth before supper (scheduled). May take up to two additional doses if needed for nausea/vomiting.) 90 tablet 5   rizatriptan (MAXALT-MLT) 10 MG disintegrating tablet DISSOLVE 1 TABLET BY MOUTH AS NEEDED FOR MIGRAINE,MAY REPEAT IN 2 HOURS IF NEEDED 10 tablet 5   rosuvastatin (CRESTOR) 20 MG tablet TAKE 1 TABLET(20 MG) BY MOUTH DAILY (Patient taking differently: Take 20 mg by mouth every evening.) 90 tablet 3   Ubrogepant (UBRELVY) 100 MG TABS Take 100 mg by mouth as needed (Take 1 tablet at the earlist on set of a Migraine. Max 2  tablets in a 24 hour period). 16 tablet 5   No current facility-administered medications for this visit.     ALLERGIES: Bactrim [sulfamethoxazole-trimethoprim], Ciprofloxacin, Penicillins, Monistat [miconazole], Morphine and related, Reglan [metoclopramide], Benadryl [diphenhydramine hcl], and Diflucan [fluconazole]  Family History  Problem Relation Age of Onset   Alcohol abuse Mother    Anxiety disorder Mother    Depression Mother        severe with hx of suicide attempt. treated with ECT   COPD Mother    Cancer Mother        Cervical   Stroke Mother    Anxiety disorder Father    Drug abuse Father    COPD Father    Alcohol abuse Maternal Uncle    Anxiety disorder Maternal Grandmother    Depression Maternal Grandmother    ADD / ADHD Brother    Breast cancer Paternal Grandmother 25   Stomach cancer Neg Hx    Colon cancer Neg Hx    Rectal cancer Neg Hx    Esophageal cancer Neg Hx     Social History   Socioeconomic History   Marital status: Divorced    Spouse name: Not on file   Number of children: 0   Years of education: 14   Highest education level: Not on file  Occupational History   Occupation: Disability  Tobacco Use   Smoking status: Former    Packs/day: 0.50    Years: 1.00    Total pack years: 0.50    Types: Cigarettes    Quit date: 12/03/2016    Years since quitting: 5.2   Smokeless tobacco: Never   Tobacco comments:    quit smoking 8 months ago as of 10/15/18  Vaping Use   Vaping Use: Never used  Substance and Sexual Activity   Alcohol use: No   Drug use: Not Currently    Types: Marijuana    Comment: last use 3 years ago Marijuana   Sexual activity: Not Currently    Comment: 1st intercourse 46 yo-Fewer than 5 partners  Other Topics Concern   Not on file  Social History Narrative   Fun: Play piano; Build Lego   Denies abuse and feels safe at home. Living with friend in [place].    Right handed    Drink coffee  1 cup eveyday   Leave with 2  friends   1 story home, 16 steps   Social Determinants of Health   Financial Resource Strain: Low Risk  (11/06/2021)   Overall Financial Resource Strain (CARDIA)    Difficulty of Paying Living Expenses: Not hard at all  Food Insecurity: No Food Insecurity (11/06/2021)   Hunger Vital Sign    Worried About Running Out of Food in the Last Year: Never true    Ran Out of Food in the Last Year: Never true  Transportation Needs: No Transportation Needs (11/06/2021)   PRAPARE - Administrator, Civil Service (Medical): No    Lack of Transportation (Non-Medical): No  Physical Activity: Inactive (11/06/2021)   Exercise Vital Sign    Days of Exercise per Week: 0 days    Minutes of Exercise per Session: 0 min  Stress: No Stress Concern Present (11/06/2021)   [employer] of Occupational Health - Occupational Stress Questionnaire    Feeling of Stress : Not at all  Social Connections: Unknown (11/06/2021)   Social Connection and Isolation Panel [NHANES]    Frequency of Communication with Friends and Family: More than three times a week    Frequency of Social Gatherings with Friends and Family: More than three times a week    Attends Religious Services: Patient refused    Active Member of Clubs or Organizations: Patient refused    Attends Banker Meetings: Patient refused    Marital Status: Divorced  Catering manager Violence: Not At Risk (11/06/2021)   Humiliation, Afraid, Rape, and Kick questionnaire    Fear of Current or Ex-Partner: No    Emotionally Abused: No    Physically Abused: No    Sexually Abused: No    Review of Systems  All other systems reviewed and are negative.   PHYSICAL EXAMINATION:    BP 100/66   Ht 5' 2.75" (1.594 m)   Wt 105 lb (47.6 kg)   LMP 02/18/2022 (Approximate)   BMI 18.75 kg/m     General appearance: alert, cooperative and appears stated age  ASSESSMENT  Left back pain.  Hx scoliosis. Osteopenia of hips and spine.  Interstim  patient.   PLAN  We discussed her CT of the thoracic spine.  Last BMD reviewed.  We discussed calcium, vit D supplements.  Next BMD in 2024.  Next annual exam in May, 2024.  Fu prn also.    An After Visit Summary was printed and given to the patient.  31 min  total time was spent for this patient encounter, including preparation, face-to-face counseling with the patient, coordination of care, and documentation of the encounter.

## 2022-03-17 NOTE — Progress Notes (Signed)
HPI F former smoker followed for Mild OSA, complicated by Migraine, Nasal Septal Deviation, IBS/Constipation, GERD, Superior Mesenteric Artery Syndrome, Arthritis, Osteopenia, Rheumatoid Arthritis, Scoliosis, ADHD, BiPolar 2, Insomnia, Anorexia Nervosa, Cannabis, Depression, Anxiety, Gastric Restrictive Surgery, Hyperlipidemia,  HST 11/20/21- AHI 7.5/ hr, desaturation to 92%, body weight 106 lbs  =====================================================   01/17/22- 45 yoF former smoker followed for Mild OSA, complicated by Migraine, Nasal Septal Deviation, IBS/Constipation, GERD, Superior Mesenteric Artery Syndrome, Arthritis, Osteopenia, Rheumatoid Arthritis, Scoliosis, ADHD, BiPolar 2, Insomnia, Anorexia Nervosa, Cannabis, Depression, Anxiety, Gastric Restrictive Surgery, Hyperlipidemia,  HST 11/20/21- AHI 7.5/ hr, desaturation to 92%, body weight 106 lbs CPAP auto 5-15/ [employer] Download compliance-compliance 27%, AHI 11.3/ hr Body weight today-104 lbs Covid vax- no Has only had CPAP about 2 weeks. Mask too big, comes off in sleep. She is getting some kind of error message on the machine about the heater. ENT Dr. Irene Limbo offered turbinate reduction surgery  03/19/22- 45 yoF former smoker followed for Mild OSA, complicated by Migraine, Nasal Septal Deviation, IBS/Constipation, GERD, Superior Mesenteric Artery Syndrome, Arthritis, Osteopenia, Rheumatoid Arthritis, Scoliosis, ADHD, BiPolar 2, Insomnia, Anorexia Nervosa, Cannabis, Depression, Anxiety, Gastric Restrictive Surgery, Hyperlipidemia,  CPAP auto 5-15/ [employer] Download compliance-   10%, AHI 10.7/ hr             We asked [employer] to do a mask fitting Body weight- 107 lbs SGY 6/23-incisional hernia repair Download not recording, but patient reports routine nightly use of CPAP. Download reviewed.  Compliance and treatment goals discussed.  She recently got a fullface mask and says she definitely sleeps better with CPAP and is trying to use it every  day.  We will ask DME to provide SD card.  She says she did not use CPAP at time of hernia repair.   ROS-see HPI   + = positive Constitutional:    weight loss, night sweats, fevers, chills, fatigue, lassitude. HEENT:    +headaches, difficulty swallowing, +tooth/dental problems, sore throat,       sneezing, itching, ear ache, nasal congestion, post nasal drip, snoring CV:    chest pain, orthopnea, PND, swelling in lower extremities, anasarca,                                   dizziness, palpitations Resp:   shortness of breath with exertion or at rest.                productive cough,   non-productive cough, coughing up of blood.              change in color of mucus.  wheezing.   Skin:    rash or lesions. GI: +heartburn, indigestion, +abdominal pain, nausea, vomiting, diarrhea,                 change in bowel habits, loss of appetite GU: dysuria, change in color of urine, no urgency or frequency.   flank pain. MS:   j+oint pain, stiffness, decreased range of motion, back pain. Neuro-     nothing unusual Psych:  change in mood or affect.  +depression or +anxiety.   memory loss.  OBJ- Physical Exam General- Alert, Oriented, Affect-appropriate, Distress- none acute, +petite/ slender Skin- rash-none, lesions- none, excoriation- none Lymphadenopathy- none Head- atraumatic            Eyes- Gross vision intact, PERRLA, conjunctivae and secretions clear  Ears- Hearing, canals-normal            Nose- Clear, no-Septal dev, mucus, polyps, erosion, perforation             Throat- Mallampati III , mucosa clear , drainage- none, tonsils- atrophic, +teeth Neck- flexible , trachea midline, no stridor , thyroid nl, carotid no bruit Chest - symmetrical excursion , unlabored           Heart/CV- RRR , no murmur , no gallop  , no rub, nl s1 s2                           - JVD- none , edema- none, stasis changes- none, varices- none           Lung- clear to P&A, wheeze- none, cough- none ,  dullness-none, rub- none           Chest wall-  Abd-  Br/ Gen/ Rectal- Not done, not indicated Extrem- cyanosis- none, clubbing, none, atrophy- none, strength- nl Neuro- grossly intact to observation

## 2022-03-19 ENCOUNTER — Encounter: Payer: Self-pay | Admitting: Internal Medicine

## 2022-03-19 ENCOUNTER — Ambulatory Visit (INDEPENDENT_AMBULATORY_CARE_PROVIDER_SITE_OTHER): Payer: Medicare Other | Admitting: Internal Medicine

## 2022-03-19 DIAGNOSIS — G4733 Obstructive sleep apnea (adult) (pediatric): Secondary | ICD-10-CM

## 2022-03-19 NOTE — Patient Instructions (Signed)
We can continue CPAP auto 5-15 ° °Please call if we can help °

## 2022-03-28 ENCOUNTER — Telehealth (HOSPITAL_BASED_OUTPATIENT_CLINIC_OR_DEPARTMENT_OTHER): Payer: Medicare Other | Admitting: Psychiatry

## 2022-03-28 ENCOUNTER — Encounter (HOSPITAL_COMMUNITY): Payer: Self-pay | Admitting: Psychiatry

## 2022-03-28 DIAGNOSIS — F3281 Premenstrual dysphoric disorder: Secondary | ICD-10-CM

## 2022-03-28 DIAGNOSIS — G2589 Other specified extrapyramidal and movement disorders: Secondary | ICD-10-CM | POA: Diagnosis not present

## 2022-03-28 DIAGNOSIS — F411 Generalized anxiety disorder: Secondary | ICD-10-CM

## 2022-03-28 DIAGNOSIS — F3181 Bipolar II disorder: Secondary | ICD-10-CM

## 2022-03-28 MED ORDER — LAMOTRIGINE 150 MG PO TABS: 300.0000 mg | ORAL_TABLET | Freq: Every morning | ORAL | 0 refills | Status: DC

## 2022-03-28 MED ORDER — BENZTROPINE MESYLATE 0.5 MG PO TABS: 0.5000 mg | ORAL_TABLET | Freq: Every day | ORAL | 0 refills | Status: DC

## 2022-03-28 MED ORDER — OLANZAPINE 15 MG PO TABS: 15.0000 mg | ORAL_TABLET | Freq: Every day | ORAL | 0 refills | Status: DC

## 2022-03-28 NOTE — Progress Notes (Signed)
Virtual Visit via Video Note  I connected with Kadeisha A Dutan on 03/28/22 at  3:30 PM EDT by   a video enabled telemedicine application and verified that I am speaking with the correct person using two identifiers.  Location: Patient: home Provider: office   I discussed the limitations of evaluation and management by telemedicine and the availability of in person appointments. The patient expressed understanding and agreed to proceed.  History of Present Illness: [name] shares she is struggling with irritability "a little bit". She is currently on her period and her mood is usually very irritable at that time. She is just annoyed with everything right now. Since starting Zyprexa she has noticed that she is able to control her response a little more. She is still annoyed and irritable but not as much. She has not noticed any SE. Her sleep is good and she is getting 6-7 hrs/night. She denies fatigue and is eating well. [name] has a lot more free time now that the baby is in speech therapy twice a week. She finds various ways to keep herself busy. Her depression is stable. She denies SI/HI. Her anxiety is stable. She is managing it. She denies any manic or hypomanic like symptoms or episodes. [name] denies any EPS.    Observations/Objective: Psychiatric Specialty Exam: ROS  Last menstrual period 02/18/2022.There is no height or weight on file to calculate BMI.  General Appearance: Casual  Eye Contact:  Good  Speech:  Clear and Coherent and Normal Rate  Volume:  Normal  Mood:  Irritable  Affect:  Full Range  Thought Process:  Goal Directed, Linear, and Descriptions of Associations: Intact  Orientation:  Full (Time, Place, and Person)  Thought Content:  Logical  Suicidal Thoughts:  No  Homicidal Thoughts:  No  Memory:  Immediate;   Good  Judgement:  Good  Insight:  Good  Psychomotor Activity:  Normal  Concentration:  Concentration: Good  Recall:  Good  Fund of Knowledge:  Good  Language:   Good  Akathisia:  No  Handed:  Right  AIMS (if indicated):     Assets:  Communication Skills Desire for Improvement Financial Resources/Insurance Housing Resilience Social Support Talents/Skills Transportation Vocational/Educational  ADL's:  Intact  Cognition:  WNL  Sleep:        Assessment and Plan:     02/27/2022    4:02 PM 11/29/2021    4:01 PM 11/06/2021    3:38 PM 10/25/2021    4:12 PM 09/27/2021    3:43 PM  Depression screen PHQ 2/9  Decreased Interest 0 0 0  0  Down, Depressed, Hopeless 0 1 0 3 0  PHQ - 2 Score 0 1 0 3 0  Altered sleeping 0 1  0   Tired, decreased energy 0 3  3   Change in appetite 0 0  0   Feeling bad or failure about yourself  0      Trouble concentrating 0 3  0   Moving slowly or fidgety/restless 0 0  0   Suicidal thoughts 0 0  0   PHQ-9 Score 0 8  6   Difficult doing work/chores  Very difficult  Somewhat difficult     Flowsheet Row Video Visit from 01/24/2022 in [facility] Pre-Admission Testing 62 from 01/11/2022 in [facility] Video Visit from 11/29/2021 in [facility]  C-SSRS RISK CATEGORY No Risk No Risk No Risk  Pt is aware that these meds carry a teratogenic risk. Pt will discuss plan of action if she does or plans to become pregnant in the future.  Status of current problems: small improvement in irritability  Meds: increase Zyprexa to 15mg  po qHS 1. PMDD (premenstrual dysphoric disorder) - OLANZapine (ZYPREXA) 15 MG tablet; Take 1 tablet (15 mg total) by mouth at bedtime.  Dispense: 90 tablet; Refill: 0  2. Bipolar II disorder (HCC) - OLANZapine (ZYPREXA) 15 MG tablet; Take 1 tablet (15 mg total) by mouth at bedtime.  Dispense: 90 tablet; Refill: 0  3. GAD (generalized anxiety disorder)  4. Combined pyramidal-extrapyramidal syndrome - benztropine (COGENTIN) 0.5 MG tablet; Take 1 tablet (0.5 mg total) by  mouth daily.  Dispense: 90 tablet; Refill: 0     Labs: none today    Therapy: brief supportive therapy provided. Discussed psychosocial stressors in detail.    Collaboration of Care: Other none  Patient/Guardian was advised Release of Information must be obtained prior to any record release in order to collaborate their care with an outside provider. Patient/Guardian was advised if they have not already done so to contact the registration department to sign all necessary forms in order for to release information regarding their care.   Consent: Patient/Guardian gives verbal consent for treatment and assignment of benefits for services provided during this visit. Patient/Guardian expressed understanding and agreed to proceed.      Follow Up Instructions: Follow up in 2-3 months or sooner if needed    I discussed the assessment and treatment plan with the patient. The patient was provided an opportunity to ask questions and all were answered. The patient agreed with the plan and demonstrated an understanding of the instructions.   The patient was advised to call back or seek an in-person evaluation if the symptoms worsen or if the condition fails to improve as anticipated.  I provided 13 minutes of non-face-to-face time during this encounter.   [place], MD

## 2022-04-05 DIAGNOSIS — G4733 Obstructive sleep apnea (adult) (pediatric): Secondary | ICD-10-CM | POA: Diagnosis not present

## 2022-04-10 ENCOUNTER — Encounter: Payer: Self-pay | Admitting: Internal Medicine

## 2022-04-10 NOTE — Assessment & Plan Note (Signed)
[facility] may not be recording correctly since she states she is using CPAP now every day. Plan-continue auto 5-15.  Install SD card

## 2022-04-15 DIAGNOSIS — M546 Pain in thoracic spine: Secondary | ICD-10-CM | POA: Diagnosis not present

## 2022-04-15 DIAGNOSIS — M47814 Spondylosis without myelopathy or radiculopathy, thoracic region: Secondary | ICD-10-CM | POA: Diagnosis not present

## 2022-04-23 ENCOUNTER — Other Ambulatory Visit (HOSPITAL_COMMUNITY): Payer: Self-pay | Admitting: Psychiatry

## 2022-04-23 DIAGNOSIS — F3281 Premenstrual dysphoric disorder: Secondary | ICD-10-CM

## 2022-04-23 DIAGNOSIS — F3181 Bipolar II disorder: Secondary | ICD-10-CM

## 2022-04-24 ENCOUNTER — Other Ambulatory Visit (HOSPITAL_COMMUNITY): Payer: Self-pay

## 2022-04-30 DIAGNOSIS — M5442 Lumbago with sciatica, left side: Secondary | ICD-10-CM | POA: Diagnosis not present

## 2022-04-30 DIAGNOSIS — G4733 Obstructive sleep apnea (adult) (pediatric): Secondary | ICD-10-CM | POA: Diagnosis not present

## 2022-05-05 DIAGNOSIS — G4733 Obstructive sleep apnea (adult) (pediatric): Secondary | ICD-10-CM | POA: Diagnosis not present

## 2022-05-07 ENCOUNTER — Telehealth (HOSPITAL_COMMUNITY): Payer: Self-pay | Admitting: *Deleted

## 2022-05-07 NOTE — Telephone Encounter (Signed)
Pt called stating that she has been having "extreme" anxiety and it is effecting everything in her life. Pt states that she has been struggling with this and is now requesting medication to help. Pt's next appointment is scheduled for 06/20/22. Please review and advise.

## 2022-05-15 ENCOUNTER — Other Ambulatory Visit: Payer: Self-pay | Admitting: Obstetrics & Gynecology

## 2022-05-15 ENCOUNTER — Other Ambulatory Visit (HOSPITAL_COMMUNITY): Payer: Self-pay | Admitting: Psychiatry

## 2022-05-15 DIAGNOSIS — F3281 Premenstrual dysphoric disorder: Secondary | ICD-10-CM

## 2022-05-15 DIAGNOSIS — F3181 Bipolar II disorder: Secondary | ICD-10-CM

## 2022-05-15 NOTE — Telephone Encounter (Signed)
Last annual exam was 10/03/2020 Scheduled on 12/05/22

## 2022-05-16 ENCOUNTER — Other Ambulatory Visit: Payer: Self-pay | Admitting: Neurosurgery

## 2022-05-16 ENCOUNTER — Other Ambulatory Visit (HOSPITAL_COMMUNITY): Payer: Self-pay | Admitting: Neurosurgery

## 2022-05-16 DIAGNOSIS — M5442 Lumbago with sciatica, left side: Secondary | ICD-10-CM

## 2022-05-20 ENCOUNTER — Other Ambulatory Visit (HOSPITAL_COMMUNITY): Payer: Self-pay | Admitting: Psychiatry

## 2022-05-20 DIAGNOSIS — F3181 Bipolar II disorder: Secondary | ICD-10-CM

## 2022-05-20 DIAGNOSIS — F3281 Premenstrual dysphoric disorder: Secondary | ICD-10-CM

## 2022-05-22 ENCOUNTER — Ambulatory Visit (HOSPITAL_COMMUNITY)
Admission: RE | Admit: 2022-05-22 | Discharge: 2022-05-22 | Disposition: A | Discharge status: Home / Self Care | Payer: Medicare Other | Source: Ambulatory Visit | Attending: Neurosurgery | Admitting: Neurosurgery

## 2022-05-22 ENCOUNTER — Other Ambulatory Visit: Payer: Self-pay

## 2022-05-22 DIAGNOSIS — M47814 Spondylosis without myelopathy or radiculopathy, thoracic region: Secondary | ICD-10-CM | POA: Diagnosis not present

## 2022-05-22 DIAGNOSIS — M5124 Other intervertebral disc displacement, thoracic region: Secondary | ICD-10-CM | POA: Diagnosis not present

## 2022-05-22 DIAGNOSIS — M5126 Other intervertebral disc displacement, lumbar region: Secondary | ICD-10-CM | POA: Diagnosis not present

## 2022-05-22 DIAGNOSIS — M5442 Lumbago with sciatica, left side: Secondary | ICD-10-CM

## 2022-05-22 DIAGNOSIS — M546 Pain in thoracic spine: Secondary | ICD-10-CM | POA: Diagnosis not present

## 2022-05-22 MED ORDER — OXYCODONE HCL 5 MG PO TABS
5.0000 mg | ORAL_TABLET | ORAL | Status: DC | PRN
  Administered 2022-05-22: 5 mg via ORAL
  Filled 2022-05-22: qty 1

## 2022-05-22 MED ORDER — ONDANSETRON HCL 4 MG/2ML IJ SOLN: 4.0000 mg | Freq: Four times a day (QID) | INTRAMUSCULAR | Status: DC | PRN

## 2022-05-22 MED ORDER — DIAZEPAM 5 MG PO TABS
5.0000 mg | ORAL_TABLET | Freq: Once | ORAL | Status: AC
  Administered 2022-05-22: 5 mg via ORAL
  Filled 2022-05-22 (×2): qty 1

## 2022-05-22 MED ORDER — IOHEXOL 300 MG/ML  SOLN
10.0000 mL | Freq: Once | INTRAMUSCULAR | Status: AC | PRN
  Administered 2022-05-22: 10 mL via INTRATHECAL

## 2022-05-22 MED ORDER — LIDOCAINE HCL (PF) 1 % IJ SOLN
5.0000 mL | Freq: Once | INTRAMUSCULAR | Status: AC
  Administered 2022-05-22: 5 mL via INTRADERMAL

## 2022-05-22 NOTE — Op Note (Signed)
05/22/2022 Thoracic, lumbar Myelogram  PATIENT:  Karen Hahn is a 46 y.o. female With mid back pain PRE-OPERATIVE DIAGNOSIS:  dorsalgia thoracic, lumbar  POST-OPERATIVE DIAGNOSIS:  same  PROCEDURE:  Lumbar Myelogram  SURGEON:  Camay Pedigo  ANESTHESIA:   local LOCAL MEDICATIONS USED:  LIDOCAINE  Procedure Note: [name] is a 46 y.o. female Was taken to the fluoroscopy suite and  positioned prone on the fluoroscopy table. Her back was prepared and draped in a sterile manner. I infiltrated 5 cc into the lumbar region. I then introduced a spinal needle into the thecal sac at the L4/5 interlaminar space. I infiltrated 10cc of Isovue 300 into the thecal sac. Fluoroscopy showed the needle and contrast in the thecal sac. Marybelle A Tiano tolerated the procedure well. she Will be taken to CT for evaluation.     PATIENT DISPOSITION:  Short Stay

## 2022-05-28 DIAGNOSIS — R519 Headache, unspecified: Secondary | ICD-10-CM | POA: Diagnosis not present

## 2022-05-28 DIAGNOSIS — R112 Nausea with vomiting, unspecified: Secondary | ICD-10-CM | POA: Diagnosis not present

## 2022-05-30 ENCOUNTER — Other Ambulatory Visit: Payer: Self-pay | Admitting: Neurosurgery

## 2022-05-30 DIAGNOSIS — G971 Other reaction to spinal and lumbar puncture: Secondary | ICD-10-CM

## 2022-06-05 ENCOUNTER — Ambulatory Visit
Admission: RE | Admit: 2022-06-05 | Discharge: 2022-06-05 | Disposition: A | Discharge status: Home / Self Care | Payer: Medicare Other | Source: Ambulatory Visit | Attending: Neurosurgery | Admitting: Neurosurgery

## 2022-06-05 DIAGNOSIS — G971 Other reaction to spinal and lumbar puncture: Secondary | ICD-10-CM

## 2022-06-05 DIAGNOSIS — G4733 Obstructive sleep apnea (adult) (pediatric): Secondary | ICD-10-CM | POA: Diagnosis not present

## 2022-06-05 MED ORDER — IOPAMIDOL (ISOVUE-M 200) INJECTION 41%
1.0000 mL | Freq: Once | INTRAMUSCULAR | Status: AC
  Administered 2022-06-05: 1 mL via EPIDURAL

## 2022-06-05 NOTE — Discharge Instructions (Signed)

## 2022-06-05 NOTE — Progress Notes (Signed)
20cc blood collected from pts right hand prior to blood patch procedure. Pt tolerated well. 1 successful attempt. Gauze and tape applied after.

## 2022-06-10 ENCOUNTER — Encounter: Payer: Self-pay | Admitting: Obstetrics and Gynecology

## 2022-06-10 ENCOUNTER — Ambulatory Visit (INDEPENDENT_AMBULATORY_CARE_PROVIDER_SITE_OTHER): Payer: Medicare Other | Admitting: Obstetrics and Gynecology

## 2022-06-10 VITALS — BP 110/68 | Ht 62.0 in | Wt 95.0 lb

## 2022-06-10 DIAGNOSIS — N949 Unspecified condition associated with female genital organs and menstrual cycle: Secondary | ICD-10-CM

## 2022-06-10 DIAGNOSIS — M5442 Lumbago with sciatica, left side: Secondary | ICD-10-CM | POA: Diagnosis not present

## 2022-06-10 DIAGNOSIS — R300 Dysuria: Secondary | ICD-10-CM | POA: Diagnosis not present

## 2022-06-10 DIAGNOSIS — Z681 Body mass index (BMI) 19 or less, adult: Secondary | ICD-10-CM | POA: Diagnosis not present

## 2022-06-10 LAB — WET PREP FOR TRICH, YEAST, CLUE

## 2022-06-10 MED ORDER — NITROFURANTOIN MONOHYD MACRO 100 MG PO CAPS: 100.0000 mg | ORAL_CAPSULE | Freq: Two times a day (BID) | ORAL | 0 refills | Status: DC

## 2022-06-10 NOTE — Progress Notes (Signed)
GYNECOLOGY  VISIT   HPI: 46 y.o.   Divorced  Caucasian  female   G2P0020 with Patient's last menstrual period was 05/19/2022 (exact date).   here for  possible uti.  Back pain and cramping on 06/05/22.  Not voiding a lot but not drinking a lot.   Some nausea with her recent headache.  Had a spinal headache due to back pain analysis.  Had a blood patch on 06/06/22, and her headache is now resolved.   Some chills at night.  No fever.   Has a constant internal vaginal burning.   Not sexually active for many years.   Had a negative UC on 08/21/21.    One coffee and one Mt. Dew.   Has Interstim since 2017.  Dr. Mena Goes placed this for overactive bladder and urinary incontinence.   GYNECOLOGIC HISTORY: Patient's last menstrual period was 05/19/2022 (exact date). Contraception:  abstinence  Menopausal hormone therapy:  n/a Last mammogram:  06-20-21 diag.Bil/Bil.US/Neg/Birads.  Has appointment.  Last pap smear:  09-19-17 Neg:Neg HR HPV, 11-08-13 Neg         OB History     Gravida  2   Para      Term      Preterm      AB  2   Living         SAB  2   IAB      Ectopic      Multiple      Live Births                 Patient Active Problem List   Diagnosis Date Noted   OSA (obstructive sleep apnea)    Pain in thoracic spine 02/12/2022   Chronic low back pain 11/01/2021   Degeneration of lumbar intervertebral disc 11/01/2021   Deviated nasal septum 08/29/2021   Hypersomnolence 08/29/2021   Incisional hernia, without obstruction or gangrene 05/19/2021   Periumbilical abdominal pain 05/19/2021   Ulnar neuropathy 03/05/2021   Cellulitis of right leg 03/05/2021   Ulnar nerve entrapment at elbow 02/12/2021   Cellulitis of great toe, right 02/08/2021   Carpal tunnel syndrome of right wrist 11/24/2020   Gastroesophageal reflux disease 10/26/2020   Inflammatory arthritis 10/26/2020   Scoliosis 10/26/2020   Urinary frequency 09/27/2020   COVID 08/31/2020    Weight loss 02/21/2020   Encounter for orthopedic follow-up care 12/09/2019   Rheumatoid arthritis (HCC) 11/24/2019   Right wrist pain 07/29/2019   Right hand pain 07/29/2019   Intractable headache 04/14/2019   Hyperglycemia 02/18/2019   HLD (hyperlipidemia) 02/16/2018   Migraine 02/16/2018   Encounter for well adult exam with abnormal findings 05/21/2017   UTI (urinary tract infection) 05/21/2017   Thrush 05/21/2017   Vitamin D deficiency 05/21/2017   Osteopenia 05/21/2017   Dyspnea 05/21/2017   Hematuria 01/01/2017   Lump in female breast 01/01/2017   Fatigue 01/01/2017   Candida vaginitis 01/01/2017   Dysuria 12/19/2016   Gastroesophageal reflux disease with esophagitis 11/14/2016   Irritable bowel syndrome with constipation 11/14/2016   Severe episode of recurrent major depressive disorder, without psychotic features (HCC) 08/24/2015   Cough 11/15/2014   Major depressive disorder, recurrent episode, moderate (HCC) 07/14/2014   ADD (attention deficit hyperactivity disorder, inattentive type) 07/14/2014   Impulse control disorder 07/14/2014   Pain of upper abdomen 05/09/2014   Depression 03/08/2014   GAD (generalized anxiety disorder) 03/08/2014   Anorexia nervosa 11/17/2013   Impulse control disorder, unspecified 11/17/2013   Cannabis  abuse 11/17/2013   Insomnia 11/17/2013    Past Medical History:  Diagnosis Date   Abdominal hernia    ADD (attention deficit disorder)    Allergy    Anorexia nervosa without bulimia    Chronic constipation    Drug abuse (HCC)    GAD (generalized anxiety disorder)    GERD (gastroesophageal reflux disease)    History of gastric restrictive surgery    History of gastric ulcer    History of Helicobacter pylori infection    after 2003   History of herniated intervertebral disc    HLD (hyperlipidemia) 02/16/2018   Hx of migraine headaches    Impulse control disorder    Moderate major depression (HCC)    Osteopenia    Osteoporosis     Post concussion syndrome    fell 10/20/2015   Sleep apnea    SMAS (superior mesenteric artery syndrome) (HCC)    Ulnar neuropathy    Urge urinary incontinence     Past Surgical History:  Procedure Laterality Date   CARPAL TUNNEL RELEASE Right    Ulnar repair   DILATION AND CURETTAGE OF UTERUS  08/06/2003   INCISIONAL HERNIA REPAIR N/A 01/25/2022   Procedure: OPEN INCISIONAL HERNIA REPAIR;  Surgeon: Sheliah Hatch De Blanch, MD;  Location: [facility];  Service: General;  Laterality: N/A;   INTERSTIM IMPLANT PLACEMENT N/A 10/18/2015   Procedure: [name] IMPLANT FIRST STAGE;  Surgeon: Jerilee Field, MD;  Location: [facility];  Service: Urology;  Laterality: N/A;   INTERSTIM IMPLANT PLACEMENT N/A 10/18/2015   Procedure: [name] IMPLANT SECOND STAGE;  Surgeon: Jerilee Field, MD;  Location: [facility];  Service: Urology;  Laterality: N/A;   LAPAROSCOPIC GASTRIC RESTRICTIVE DUODENAL PROCEDURE (DUODENAL SWITCH)  07/05/2002   ORIF RIGHT WRIST FX  08/06/2011   REMOVAL HARDWARE X2 in  2013--  No retained hardware    Current Outpatient Medications  Medication Sig Dispense Refill   bismuth subsalicylate (PEPTO BISMOL) 262 MG chewable tablet Chew 524 mg by mouth 4 (four) times daily as needed for diarrhea or loose stools or indigestion.     dicyclomine (BENTYL) 10 MG capsule Take 1 capsule (10 mg total) by mouth in the morning, at noon, in the evening, and at bedtime. 120 capsule 5   Erenumab-aooe (AIMOVIG) 140 MG/ML SOAJ Inject into the skin.     ibuprofen (ADVIL) 200 MG tablet Take 1,200 mg by mouth every 8 (eight) hours as needed for moderate pain.     lamoTRIgine (LAMICTAL) 150 MG tablet Take 2 tablets (300 mg total) by mouth in the morning. 180 tablet 0   ondansetron (ZOFRAN) 8 MG tablet Take 8 mg by mouth every 8 (eight) hours as needed for nausea or vomiting.     promethazine (PHENERGAN) 25 MG tablet Take 1 tablet (25 mg total) by mouth every 6 (six) hours as  needed for nausea or vomiting. (Patient taking differently: Take 25 mg by mouth See admin instructions. Take 1 tablet (25 mg) by mouth in the morning & take 1 tablet (25 mg) by mouth before supper as needed. May take up to two additional doses if needed for nausea/vomiting.) 90 tablet 5   rizatriptan (MAXALT-MLT) 10 MG disintegrating tablet DISSOLVE 1 TABLET BY MOUTH AS NEEDED FOR MIGRAINE,MAY REPEAT IN 2 HOURS IF NEEDED 10 tablet 5   rosuvastatin (CRESTOR) 20 MG tablet TAKE 1 TABLET(20 MG) BY MOUTH DAILY (Patient taking differently: Take 20 mg by mouth every evening.) 90 tablet 3   Ubrogepant (  UBRELVY) 100 MG TABS Take 100 mg by mouth as needed (Take 1 tablet at the earlist on set of a Migraine. Max 2 tablets in a 24 hour period). 16 tablet 5   No current facility-administered medications for this visit.     ALLERGIES: Bactrim [sulfamethoxazole-trimethoprim], Ciprofloxacin, Penicillins, Monistat [miconazole], Morphine and related, Reglan [metoclopramide], Benadryl [diphenhydramine hcl], and Diflucan [fluconazole]  Family History  Problem Relation Age of Onset   Alcohol abuse Mother    Anxiety disorder Mother    Depression Mother        severe with hx of suicide attempt. treated with ECT   COPD Mother    Cancer Mother        Cervical   Stroke Mother    Anxiety disorder Father    Drug abuse Father    COPD Father    Alcohol abuse Maternal Uncle    Anxiety disorder Maternal Grandmother    Depression Maternal Grandmother    ADD / ADHD Brother    Breast cancer Paternal Grandmother 14   Stomach cancer Neg Hx    Colon cancer Neg Hx    Rectal cancer Neg Hx    Esophageal cancer Neg Hx     Social History   Socioeconomic History   Marital status: Divorced    Spouse name: Not on file   Number of children: 0   Years of education: 14   Highest education level: Not on file  Occupational History   Occupation: Disability  Tobacco Use   Smoking status: Former    Packs/day: 0.50    Years:  1.00    Total pack years: 0.50    Types: Cigarettes    Quit date: 12/03/2016    Years since quitting: 5.5   Smokeless tobacco: Never   Tobacco comments:    quit smoking in 2018  Vaping Use   Vaping Use: Never used  Substance and Sexual Activity   Alcohol use: No   Drug use: Not Currently    Types: Marijuana    Comment: last use 3 years ago Marijuana   Sexual activity: Not Currently    Comment: 1st intercourse 46 yo-Fewer than 5 partners  Other Topics Concern   Not on file  Social History Narrative   Fun: Play piano; Build Lego   Denies abuse and feels safe at home. Living with friend in [place].    Right handed    Drink coffee 1 cup eveyday   Leave with 2 friends   1 story home, 16 steps   Social Determinants of Health   Financial Resource Strain: Low Risk  (11/06/2021)   Overall Financial Resource Strain (CARDIA)    Difficulty of Paying Living Expenses: Not hard at all  Food Insecurity: No Food Insecurity (11/06/2021)   Hunger Vital Sign    Worried About Running Out of Food in the Last Year: Never true    Ran Out of Food in the Last Year: Never true  Transportation Needs: No Transportation Needs (11/06/2021)   PRAPARE - Administrator, Civil Service (Medical): No    Lack of Transportation (Non-Medical): No  Physical Activity: Inactive (11/06/2021)   Exercise Vital Sign    Days of Exercise per Week: 0 days    Minutes of Exercise per Session: 0 min  Stress: No Stress Concern Present (11/06/2021)   [employer] of Occupational Health - Occupational Stress Questionnaire    Feeling of Stress : Not at all  Social Connections: Unknown (11/06/2021)  Social Licensed conveyancer [NHANES]    Frequency of Communication with Friends and Family: More than three times a week    Frequency of Social Gatherings with Friends and Family: More than three times a week    Attends Religious Services: Patient refused    Active Member of Clubs or Organizations: Patient  refused    Attends Archivist Meetings: Patient refused    Marital Status: Divorced  Human resources officer Violence: Not At Risk (11/06/2021)   Humiliation, Afraid, Rape, and Kick questionnaire    Fear of Current or Ex-Partner: No    Emotionally Abused: No    Physically Abused: No    Sexually Abused: No    Review of Systems  Genitourinary:  Positive for difficulty urinating and dysuria.  Musculoskeletal:  Positive for back pain.  Neurological:  Positive for speech difficulty.    PHYSICAL EXAMINATION:    BP 110/68 (BP Location: Right Arm, Patient Position: Sitting, Cuff Size: Normal)   Ht 5\' 2"  (1.575 m)   Wt 95 lb (43.1 kg)   LMP 05/19/2022 (Exact Date)   BMI 17.38 kg/m     General appearance: alert, cooperative and appears stated age   Pelvic: External genitalia:  no lesions              Urethra:  normal appearing urethra with no masses, tenderness or lesions              Bartholins and Skenes: normal                 Vagina: normal appearing vagina with normal color and white liquid discharge, no lesions              Cervix: no lesions                Bimanual Exam:  Uterus:  normal size, contour, position, consistency, mobility, non-tender              Adnexa: no mass, fullness, tenderness          Chaperone was present for exam:  Kialexis, RNA  ASSESSMENT  Dysuria.  Vaginal discharge.  Interstim use for urinary incontinence and overactive bladder.   PLAN  Urinalysis:  sg 1.015, pH 7.5, 1+ bilirubin, NS RBC, NS WBC, 0 - 5 squams, few bacteria, many crystals. UC. Macrobid 100 mg po bid x 5 days per patient request. Wet prep:  negative yeast, clue cells, and trichomonas.    Patient will schedule a breast and pelvic exam.  She will update her mammogram.  I recommend follow up with Dr. Junious Silk.    An After Visit Summary was printed and given to the patient.  27 min  total time was spent for this patient encounter, including preparation, face-to-face counseling  with the patient, coordination of care, and documentation of the encounter.

## 2022-06-12 LAB — URINE CULTURE
MICRO NUMBER:: 14148521
Result:: NO GROWTH
SPECIMEN QUALITY:: ADEQUATE

## 2022-06-12 LAB — URINALYSIS, COMPLETE W/RFL CULTURE
Glucose, UA: NEGATIVE
Hgb urine dipstick: NEGATIVE
Hyaline Cast: NONE SEEN /LPF
Ketones, ur: NEGATIVE
Leukocyte Esterase: NEGATIVE
Nitrites, Initial: NEGATIVE
RBC / HPF: NONE SEEN /HPF (ref 0–2)
Specific Gravity, Urine: 1.015 (ref 1.001–1.035)
WBC, UA: NONE SEEN /HPF (ref 0–5)
pH: 7.5 (ref 5.0–8.0)

## 2022-06-12 LAB — CULTURE INDICATED

## 2022-06-20 ENCOUNTER — Telehealth (HOSPITAL_BASED_OUTPATIENT_CLINIC_OR_DEPARTMENT_OTHER): Payer: Medicare Other | Admitting: Psychiatry

## 2022-06-20 ENCOUNTER — Encounter: Payer: Self-pay | Admitting: Physical Medicine & Rehabilitation

## 2022-06-20 ENCOUNTER — Encounter (HOSPITAL_COMMUNITY): Payer: Self-pay | Admitting: Psychiatry

## 2022-06-20 DIAGNOSIS — M546 Pain in thoracic spine: Secondary | ICD-10-CM | POA: Diagnosis not present

## 2022-06-20 DIAGNOSIS — G894 Chronic pain syndrome: Secondary | ICD-10-CM | POA: Diagnosis not present

## 2022-06-20 DIAGNOSIS — M545 Low back pain, unspecified: Secondary | ICD-10-CM | POA: Diagnosis not present

## 2022-06-20 DIAGNOSIS — F411 Generalized anxiety disorder: Secondary | ICD-10-CM | POA: Diagnosis not present

## 2022-06-20 DIAGNOSIS — Z5181 Encounter for therapeutic drug level monitoring: Secondary | ICD-10-CM | POA: Diagnosis not present

## 2022-06-20 DIAGNOSIS — F639 Impulse disorder, unspecified: Secondary | ICD-10-CM

## 2022-06-20 DIAGNOSIS — F3281 Premenstrual dysphoric disorder: Secondary | ICD-10-CM | POA: Diagnosis not present

## 2022-06-20 DIAGNOSIS — G4700 Insomnia, unspecified: Secondary | ICD-10-CM

## 2022-06-20 DIAGNOSIS — F3181 Bipolar II disorder: Secondary | ICD-10-CM | POA: Diagnosis not present

## 2022-06-20 DIAGNOSIS — Z4789 Encounter for other orthopedic aftercare: Secondary | ICD-10-CM | POA: Diagnosis not present

## 2022-06-20 DIAGNOSIS — F5000 Anorexia nervosa, unspecified: Secondary | ICD-10-CM

## 2022-06-20 DIAGNOSIS — G2589 Other specified extrapyramidal and movement disorders: Secondary | ICD-10-CM

## 2022-06-20 DIAGNOSIS — M5136 Other intervertebral disc degeneration, lumbar region: Secondary | ICD-10-CM | POA: Diagnosis not present

## 2022-06-20 MED ORDER — BENZTROPINE MESYLATE 0.5 MG PO TABS: 0.5000 mg | ORAL_TABLET | Freq: Every day | ORAL | 0 refills | Status: DC

## 2022-06-20 MED ORDER — OLANZAPINE 15 MG PO TABS: 15.0000 mg | ORAL_TABLET | Freq: Every day | ORAL | 0 refills | Status: DC

## 2022-06-20 MED ORDER — LAMOTRIGINE 150 MG PO TABS: 300.0000 mg | ORAL_TABLET | Freq: Every morning | ORAL | 0 refills | Status: DC
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
## 2022-06-21 ENCOUNTER — Encounter: Payer: Self-pay | Admitting: Internal Medicine

## 2022-06-24 ENCOUNTER — Telehealth (HOSPITAL_COMMUNITY): Payer: Self-pay | Admitting: Psychiatry

## 2022-06-24 DIAGNOSIS — Z1231 Encounter for screening mammogram for malignant neoplasm of breast: Secondary | ICD-10-CM | POA: Diagnosis not present

## 2022-06-24 LAB — HM MAMMOGRAPHY

## 2022-07-05 DIAGNOSIS — N643 Galactorrhea not associated with childbirth: Secondary | ICD-10-CM

## 2022-07-05 HISTORY — DX: Galactorrhea not associated with childbirth: N64.3

## 2022-07-08 DIAGNOSIS — R922 Inconclusive mammogram: Secondary | ICD-10-CM | POA: Diagnosis not present

## 2022-07-08 LAB — HM MAMMOGRAPHY

## 2022-07-08 NOTE — Progress Notes (Signed)
error 

## 2022-07-13 ENCOUNTER — Other Ambulatory Visit: Payer: Self-pay | Admitting: Neurology

## 2022-07-15 ENCOUNTER — Encounter: Payer: Self-pay | Admitting: Neurology

## 2022-07-15 ENCOUNTER — Encounter: Payer: Self-pay | Admitting: Internal Medicine

## 2022-07-15 ENCOUNTER — Other Ambulatory Visit: Payer: Self-pay | Admitting: Neurology

## 2022-07-16 ENCOUNTER — Other Ambulatory Visit: Payer: Self-pay | Admitting: Obstetrics & Gynecology

## 2022-07-16 ENCOUNTER — Encounter: Payer: Self-pay | Admitting: Internal Medicine

## 2022-07-16 ENCOUNTER — Encounter: Payer: Self-pay | Admitting: Obstetrics and Gynecology

## 2022-07-16 ENCOUNTER — Other Ambulatory Visit: Payer: Self-pay

## 2022-07-16 DIAGNOSIS — G4733 Obstructive sleep apnea (adult) (pediatric): Secondary | ICD-10-CM

## 2022-07-16 MED ORDER — IBUPROFEN 600 MG PO TABS: 600.0000 mg | ORAL_TABLET | Freq: Three times a day (TID) | ORAL | 1 refills | Status: DC | PRN

## 2022-07-16 MED ORDER — ONDANSETRON 4 MG PO TBDP: 4.0000 mg | ORAL_TABLET | Freq: Three times a day (TID) | ORAL | 1 refills | Status: DC | PRN

## 2022-07-16 MED ORDER — RIZATRIPTAN BENZOATE 10 MG PO TBDP: ORAL_TABLET | ORAL | 5 refills | Status: AC

## 2022-07-16 MED ORDER — AIMOVIG 140 MG/ML ~~LOC~~ SOAJ: 140.0000 mg | SUBCUTANEOUS | 3 refills | Status: AC

## 2022-07-16 NOTE — Telephone Encounter (Signed)
Please schedule visit with provider.

## 2022-07-16 NOTE — Telephone Encounter (Signed)
Refills sent to patient pharmacy per patient request.

## 2022-07-16 NOTE — Telephone Encounter (Signed)
Ok both done erx 

## 2022-07-16 NOTE — Telephone Encounter (Signed)
Patient is requesting Ondansetron and Ibuprofen to the [facility] on [address]. LOV 02/27/22, both meds were prescribed by an historical provider.

## 2022-07-16 NOTE — Telephone Encounter (Signed)
Patient had mammogram 06/2032, breast and pelvic scheduled in 5/24

## 2022-07-16 NOTE — Telephone Encounter (Signed)
Yes- please order replacement machine, same settings, humidifier, supplies, AirView/ card      Thanks

## 2022-07-16 NOTE — Progress Notes (Unsigned)
46 y.o. [id number] Divorced Caucasian female here for breast leakage, milky discharge, patient has concerns with low blood pressure.  Having nipple discharge for over one year.  Only occurs with compression.  It does not occur spontaneously.  Milky discharge, never bloody.  Can have a yellow color to it.   No SA for 10 - 15 years.   Takes oxycodone four times a day.  PCP:   Oliver Barre, MD  Patient's last menstrual period was 07/10/2022.     Period Cycle (Days): 28 Period Duration (Days): 5 Period Pattern: Regular Menstrual Flow: Moderate Menstrual Control: Maxi pad, Tampon Dysmenorrhea: (!) Mild Dysmenorrhea Symptoms: Cramping     Sexually active: No.  The current method of family planning is abstinence.    Exercising: No.   Smoker:  no, former  Health Maintenance: Pap:  09/19/17 negative: HPV not detected, 11/08/13 negative History of abnormal Pap:  yes MMG:  06/24/22, Breast Composition Category D, BI-RADS CATEGORY incomplete.    She states she had dx bilateral mammogram in follow up with no [place].  Went to [facility].  Not in EPIC.  Colonoscopy:  07/27/20 BMD:   09/25/20  Result  osteopenia TDaP:  05/21/17 Gardasil:   no   reports that she quit smoking about 5 years ago. Her smoking use included cigarettes. She has a 0.50 pack-year smoking history. She has never used smokeless tobacco. She reports current drug use. Drug: Marijuana. She reports that she does not drink alcohol.  Past Medical History:  Diagnosis Date   Abdominal hernia    ADD (attention deficit disorder)    Allergy    Anorexia nervosa without bulimia    Chronic constipation    Drug abuse (HCC)    GAD (generalized anxiety disorder)    GERD (gastroesophageal reflux disease)    History of gastric restrictive surgery    History of gastric ulcer    History of Helicobacter pylori infection    after 2003   History of herniated intervertebral disc    HLD (hyperlipidemia) 02/16/2018   Hx of migraine headaches     Impulse control disorder    Moderate major depression (HCC)    Osteopenia    Osteoporosis    Post concussion syndrome    fell 10/20/2015   Sleep apnea    SMAS (superior mesenteric artery syndrome) (HCC)    Ulnar neuropathy    Urge urinary incontinence     Past Surgical History:  Procedure Laterality Date   CARPAL TUNNEL RELEASE Right    Ulnar repair   DILATION AND CURETTAGE OF UTERUS  08/06/2003   INCISIONAL HERNIA REPAIR N/A 01/25/2022   Procedure: OPEN INCISIONAL HERNIA REPAIR;  Surgeon: Sheliah Hatch De Blanch, MD;  Location: [facility];  Service: General;  Laterality: N/A;   INTERSTIM IMPLANT PLACEMENT N/A 10/18/2015   Procedure: [name] IMPLANT FIRST STAGE;  Surgeon: Jerilee Field, MD;  Location: [facility];  Service: Urology;  Laterality: N/A;   INTERSTIM IMPLANT PLACEMENT N/A 10/18/2015   Procedure: [name] IMPLANT SECOND STAGE;  Surgeon: Jerilee Field, MD;  Location: [facility];  Service: Urology;  Laterality: N/A;   LAPAROSCOPIC GASTRIC RESTRICTIVE DUODENAL PROCEDURE (DUODENAL SWITCH)  07/05/2002   ORIF RIGHT WRIST FX  08/06/2011   REMOVAL HARDWARE X2 in  2013--  No retained hardware    Current Outpatient Medications  Medication Sig Dispense Refill   bismuth subsalicylate (PEPTO BISMOL) 262 MG chewable tablet Chew 524 mg by mouth 4 (four) times daily as needed for diarrhea or  loose stools or indigestion.     dicyclomine (BENTYL) 10 MG capsule Take 1 capsule (10 mg total) by mouth in the morning, at noon, in the evening, and at bedtime. 120 capsule 5   Erenumab-aooe (AIMOVIG) 140 MG/ML SOAJ Inject 140 mg into the skin every 30 (thirty) days. 1.12 mL 3   ibuprofen (ADVIL) 600 MG tablet Take 1 tablet (600 mg total) by mouth every 8 (eight) hours as needed. 60 tablet 1   lamoTRIgine (LAMICTAL) 150 MG tablet Take 2 tablets (300 mg total) by mouth in the morning. 180 tablet 0   OLANZapine (ZYPREXA) 15 MG tablet Take 1 tablet (15 mg total) by  mouth at bedtime. 90 tablet 0   ondansetron (ZOFRAN) 8 MG tablet Take 8 mg by mouth every 8 (eight) hours as needed for nausea or vomiting.     ondansetron (ZOFRAN-ODT) 4 MG disintegrating tablet Take 1 tablet (4 mg total) by mouth every 8 (eight) hours as needed for nausea or vomiting. 40 tablet 1   promethazine (PHENERGAN) 25 MG tablet Take 1 tablet (25 mg total) by mouth every 6 (six) hours as needed for nausea or vomiting. (Patient taking differently: Take 25 mg by mouth See admin instructions. Take 1 tablet (25 mg) by mouth in the morning & take 1 tablet (25 mg) by mouth before supper as needed. May take up to two additional doses if needed for nausea/vomiting.) 90 tablet 5   rizatriptan (MAXALT-MLT) 10 MG disintegrating tablet DISSOLVE 1 TABLET BY MOUTH AS NEEDED FOR MIGRAINE,MAY REPEAT IN 2 HOURS IF NEEDED 10 tablet 5   rosuvastatin (CRESTOR) 20 MG tablet TAKE 1 TABLET(20 MG) BY MOUTH DAILY (Patient taking differently: Take 20 mg by mouth every evening.) 90 tablet 3   Ubrogepant (UBRELVY) 100 MG TABS Take 100 mg by mouth as needed (Take 1 tablet at the earlist on set of a Migraine. Max 2 tablets in a 24 hour period). 16 tablet 5   No current facility-administered medications for this visit.    Family History  Problem Relation Age of Onset   Alcohol abuse Mother    Anxiety disorder Mother    Depression Mother        severe with hx of suicide attempt. treated with ECT   COPD Mother    Cancer Mother        Cervical   Stroke Mother    Anxiety disorder Father    Drug abuse Father    COPD Father    Alcohol abuse Maternal Uncle    Anxiety disorder Maternal Grandmother    Depression Maternal Grandmother    ADD / ADHD Brother    Breast cancer Paternal Grandmother 58   Stomach cancer Neg Hx    Colon cancer Neg Hx    Rectal cancer Neg Hx    Esophageal cancer Neg Hx     Review of Systems  Exam:   BP 100/68 (BP Location: Right Arm, Patient Position: Sitting, Cuff Size: Normal)    Pulse 99   Ht 5\' 2"  (1.575 m)   Wt 95 lb (43.1 kg)   LMP 07/10/2022   SpO2 98%   BMI 17.38 kg/m     General appearance: alert, cooperative and appears stated age Head: normocephalic, without obvious abnormality, atraumatic Neck: no adenopathy. Breasts: normal appearance, no masses or tenderness, No nipple retraction or dimpling, bilateral white milky nipple discharge.  No axillary or supraclavicular adenopathy  Chaperone was present for exam:  yes.  Assessment:   Bilateral galactorrhea.  On Zyprexa.  Recent diagnostic breast imaging normal according to patient report.   Plan: Etiologies reviewed:  pregnancy, medications like Zyprexa, thyroid dysfunction, pituitary adenoma.  Will check prolactin, TSH.  She will also FU with psychiatry to discuss her galactorrhea, which may be partially due to the Zyprexa. Will get a copy of her breast imaging.   After visit summary provided.   25 min  total time was spent for this patient encounter, including preparation, face-to-face counseling with the patient, coordination of care, and documentation of the encounter.

## 2022-07-17 ENCOUNTER — Ambulatory Visit (INDEPENDENT_AMBULATORY_CARE_PROVIDER_SITE_OTHER): Payer: Medicare Other | Admitting: Obstetrics and Gynecology

## 2022-07-17 ENCOUNTER — Encounter: Payer: Self-pay | Admitting: Obstetrics and Gynecology

## 2022-07-17 VITALS — BP 100/68 | HR 99 | Ht 62.0 in | Wt 95.0 lb

## 2022-07-17 DIAGNOSIS — E221 Hyperprolactinemia: Secondary | ICD-10-CM | POA: Diagnosis not present

## 2022-07-17 DIAGNOSIS — N643 Galactorrhea not associated with childbirth: Secondary | ICD-10-CM | POA: Diagnosis not present

## 2022-07-17 DIAGNOSIS — M8580 Other specified disorders of bone density and structure, unspecified site: Secondary | ICD-10-CM | POA: Diagnosis not present

## 2022-07-17 DIAGNOSIS — Z79899 Other long term (current) drug therapy: Secondary | ICD-10-CM | POA: Diagnosis not present

## 2022-07-17 DIAGNOSIS — E785 Hyperlipidemia, unspecified: Secondary | ICD-10-CM | POA: Diagnosis not present

## 2022-07-18 ENCOUNTER — Telehealth (HOSPITAL_BASED_OUTPATIENT_CLINIC_OR_DEPARTMENT_OTHER): Payer: Medicare Other | Admitting: Psychiatry

## 2022-07-18 ENCOUNTER — Encounter: Payer: Self-pay | Admitting: Obstetrics and Gynecology

## 2022-07-18 DIAGNOSIS — G4700 Insomnia, unspecified: Secondary | ICD-10-CM

## 2022-07-18 DIAGNOSIS — F5000 Anorexia nervosa, unspecified: Secondary | ICD-10-CM | POA: Diagnosis not present

## 2022-07-18 DIAGNOSIS — F3181 Bipolar II disorder: Secondary | ICD-10-CM | POA: Diagnosis not present

## 2022-07-18 DIAGNOSIS — F411 Generalized anxiety disorder: Secondary | ICD-10-CM | POA: Diagnosis not present

## 2022-07-18 LAB — TSH: TSH: 2.32 mIU/L

## 2022-07-18 LAB — PROLACTIN: Prolactin: 67.6 ng/mL — ABNORMAL HIGH

## 2022-07-18 MED ORDER — HYDROXYZINE PAMOATE 50 MG PO CAPS: ORAL_CAPSULE | ORAL | 0 refills | Status: DC
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
## 2022-07-18 NOTE — Patient Instructions (Signed)
Galactorrhea Galactorrhea is an abnormal milky discharge from the breast. The discharge may come from one or both nipples. It is different from the normal milk produced in nursing mothers. Galactorrhea usually occurs in women, but it can sometimes affect men. Galactorrhea can be a sign of something more serious, such as diseases of the kidney or thyroid, or problems with the pituitary gland. Your health care provider may do various tests to help determine the cause. What are the causes? This condition may be caused by: Irritation of the breast, which can result from injury, stimulation during sexual activity, or clothes rubbing against the nipple. Certain prescription medicines. Birth control pills. Certain herbal supplements. Changes in hormone levels. Stress. Sometimes the cause is not known. What are the signs or symptoms? The main symptom of this condition is nipple discharge. The discharge is often white, yellow, or green, and can be seen in one or both breasts. The discharge may flow without stopping, or it may stop and start again. How is this diagnosed? This condition may be diagnosed based on: Collecting and testing the discharge. Blood tests. Imaging tests. Tests to rule out other conditions. How is this treated? In many cases, galactorrhea will go away without treatment. In this case, your health care provider will monitor your condition to make sure that it goes away. When treatment is required, your health care provider will treat the underlying cause, such as irritation of the nipples or changes in hormone levels. Certain medicines may be stopped, if they are causing your symptoms. Follow these instructions at home:  Breast care Watch your condition for any changes. Do not squeeze your breasts or nipples. Avoid breast stimulation during sexual activity. Perform a breast self-exam once a month. Doing this more often can irritate your breasts. Avoid clothes that rub on your  nipples. Use breast pads to absorb the discharge. Wear a breast binder or a support bra to help prevent clothes from rubbing on your nipples. General instructions Take over-the-counter and prescription medicines only as told by your health care provider. Keep all follow-up visits. This is important. Contact a health care provider if: You develop hot flashes, vaginal dryness, or a lack of sexual desire. You stop having menstrual periods, or they become irregular or far apart. You have headaches. You have vision problems. Get help right away if: You have breast discharge that is bloody or pus-like. You have breast pain. You feel a lump in your breast. You have wrinkling or dimpling on your breast. You notice that your breast becomes red and swollen. Summary Galactorrhea is an abnormal milky discharge from the breast. The fluid may come from one or both nipples and is often white, yellow, or green. Galactorrhea may be caused by various things, such as irritation of the nipples, medicines, or changes in hormone levels. Galactorrhea often goes away without treatment. However, it also may be a sign of something more serious, such as diseases of the kidney or thyroid, or problems with the pituitary gland. Get help right away if you have discharge that is bloody or pus-like, if you have breast pain or a lump, or if you have skin changes on your breast. This information is not intended to replace advice given to you by your health care provider. Make sure you discuss any questions you have with your health care provider. Document Revised: 12/25/2021 Document Reviewed: 05/22/2020 Elsevier Patient Education  2023 Elsevier Inc.  

## 2022-07-19 ENCOUNTER — Encounter: Payer: Self-pay | Admitting: Obstetrics and Gynecology

## 2022-07-19 NOTE — Telephone Encounter (Signed)
Jane Callas with Dr. Theresia Bough @ [facility] and he stated that some pt's w/ implants can still have MRIs, they would just need to know the model type of implant that the pt has to determine whether or not the MRI can be performed.  An MRI would be the recommendation to rule out those conditions, especially if there is a chance that the areas are small in size. A CT would only be able to detect if there is anything present that is of a larger size.   Will inquire if pt is aware the model of her implant/device and we will go from there. The radiologist said that if the pt wasn't sure, we can always order the MRI and the techs can scan her prior to imaging to see if safe.Marland KitchenMarland Kitchen

## 2022-07-19 NOTE — Telephone Encounter (Deleted)
Please contact patient in follow up to her message.  Her prolactin is elevated, and her thyroid function is normal.

## 2022-07-19 NOTE — Telephone Encounter (Signed)
Please contact the patient regarding her My Chart message and her lab results.  Her prolactin is elevated, and her thyroid function is normal.   Zyprexa can cause galactorrhea.  I do recommend proceeding an MRI for the brain to rule of pituitary adenoma or hypothalamic lesion, as another cause for the elevated prolactin.   No bromocriptine at this time.   Please get a copy of her breast imaging from [place]. She had diagnostic imaging done already this year, but I have not received a copy of the results.  No additional breast imaging is currently anticipated by current guidelines for the type of nipple discharge she is having.

## 2022-07-19 NOTE — Telephone Encounter (Signed)
Please contact [facility] regarding the need for brain imaging to check for pituitary mass and hypothalamic lesions and her inability to do MRI due to her Interstim device.   I expect they will recommend a CT of the brain.

## 2022-07-21 NOTE — Telephone Encounter (Signed)
Please contact [facility] so they can determine if patient can proceed with an MRI of brain.  Thank you for assisting our patient.

## 2022-07-22 NOTE — Progress Notes (Signed)
12.18.23

## 2022-07-23 ENCOUNTER — Encounter: Payer: Self-pay | Admitting: Obstetrics and Gynecology

## 2022-07-23 NOTE — Telephone Encounter (Signed)
Sent reply on previous my chart message.

## 2022-07-24 ENCOUNTER — Encounter: Payer: Self-pay | Admitting: Obstetrics and Gynecology

## 2022-07-25 ENCOUNTER — Telehealth: Payer: Self-pay

## 2022-07-25 DIAGNOSIS — R7989 Other specified abnormal findings of blood chemistry: Secondary | ICD-10-CM

## 2022-07-25 NOTE — Telephone Encounter (Signed)
Per mychart msg on 07/18/22, Dr Edward Jolly wrote: "I do recommend proceeding an MRI for the brain to rule of pituitary adenoma or hypothalamic lesion, as another cause for the elevated prolactin."   Order placed.

## 2022-07-30 DIAGNOSIS — G4733 Obstructive sleep apnea (adult) (pediatric): Secondary | ICD-10-CM | POA: Diagnosis not present

## 2022-08-02 DIAGNOSIS — G5602 Carpal tunnel syndrome, left upper limb: Secondary | ICD-10-CM | POA: Diagnosis not present

## 2022-08-05 ENCOUNTER — Other Ambulatory Visit: Payer: Self-pay | Admitting: Obstetrics & Gynecology

## 2022-08-06 NOTE — Telephone Encounter (Signed)
Last ordered 7 mos ago by Dr. Marguerita Merles but Estill Bamberg, CMA had discontinued this med from patient's med list "course completed".

## 2022-08-06 NOTE — Telephone Encounter (Signed)
I spoke with patient on a different matter today but told her I noticed this Brain MRI not yet scheduled. She said that she is waiting on [place] because they needed to check with a supervisor. She said she felt sure if was okay with her implant but they needed to check and get back with her.

## 2022-08-06 NOTE — Telephone Encounter (Signed)
Refill of Boniva not appropriate.   Her bone density testing has been ordered through her PCP office, last done in 2022.  She should check with them to see if they are planning a recheck on this.

## 2022-08-06 NOTE — Telephone Encounter (Signed)
I spoke with patient and she said she did not request this refill. I told her we would just refuse it.  Refill refused.

## 2022-08-07 NOTE — Telephone Encounter (Signed)
Spoke with Karen Hahn at [facility] to see what the delay was in getting the pt scheduled and they informed me that the pt has already been screening but needed additional info regarding what kind of stimulator it was and/or the location. She was able to locate the type and informed me that she will email their tech supervisor and find out if safe and if so, will call pt to get her scheduled.

## 2022-08-09 ENCOUNTER — Other Ambulatory Visit (HOSPITAL_COMMUNITY): Payer: Self-pay | Admitting: Psychiatry

## 2022-08-09 ENCOUNTER — Encounter: Payer: Self-pay | Admitting: Physical Medicine & Rehabilitation

## 2022-08-09 ENCOUNTER — Encounter: Payer: 59 | Attending: Physical Medicine & Rehabilitation | Admitting: Physical Medicine & Rehabilitation

## 2022-08-09 VITALS — BP 103/69 | HR 101 | Temp 98.4°F | Ht 62.0 in | Wt 103.0 lb

## 2022-08-09 DIAGNOSIS — F411 Generalized anxiety disorder: Secondary | ICD-10-CM

## 2022-08-09 DIAGNOSIS — R202 Paresthesia of skin: Secondary | ICD-10-CM | POA: Insufficient documentation

## 2022-08-09 DIAGNOSIS — R200 Anesthesia of skin: Secondary | ICD-10-CM

## 2022-08-09 NOTE — Telephone Encounter (Signed)
MRI scheduled for 08/24/2022, will route to MM for PA.

## 2022-08-09 NOTE — Progress Notes (Signed)
Pt . Referred by Dr Christella Noa for EMG/NCV evaluation of bilateral Lower extremity weakness.   Lumbar and thoracic myelogram performed in Oct 2023 demonstrated no compressive lesions.  No history of DM, no neurotoxic medications on current list.   Essentially normal EMG/NCV of BLE.  Minimal prolongation of Bilateral sural sensory studies of questionable clinical significance Please refer to media tab for scanned report. Pt to follow up with Dr Christella Noa to discuss results

## 2022-08-12 ENCOUNTER — Encounter: Payer: Self-pay | Admitting: Obstetrics and Gynecology

## 2022-08-13 ENCOUNTER — Encounter: Payer: Self-pay | Admitting: Internal Medicine

## 2022-08-13 DIAGNOSIS — G4733 Obstructive sleep apnea (adult) (pediatric): Secondary | ICD-10-CM

## 2022-08-13 NOTE — Telephone Encounter (Signed)
Call placed to patient. Left message to call Sharee Pimple, RN at [facility], [phone], OPT 5 in f/u to MyChart message.  MyChart message to patient.

## 2022-08-14 NOTE — Telephone Encounter (Signed)
Mychart message sent by pt: Karen Hahn "[name]"  P [facility] (supporting Deneise Lever, MD)12 hours ago (9:24 PM)    Hi Dr. Annamaria Boots.   I have been having trouble sleeping with my cpap machine and am not really comfortable using it. I have tried both a full gave mask and also a nasel one.  Is there anything else other then the cpap we can do?  Does oxygen work for that      Dr. Annamaria Boots, please advise.

## 2022-08-14 NOTE — Telephone Encounter (Signed)
Your sleep apnea is very mild and you may choose not to be treated now. An alternative to CPAP for you might be a fitted mouthpiece. If you wwish, I can order referral to Orthodontist Dr Oneal Grout to learn about these for a diagnosis of obstructive sleep apnea.

## 2022-08-15 ENCOUNTER — Other Ambulatory Visit (HOSPITAL_COMMUNITY): Payer: Self-pay | Admitting: Psychiatry

## 2022-08-15 ENCOUNTER — Other Ambulatory Visit: Payer: Self-pay | Admitting: Internal Medicine

## 2022-08-15 DIAGNOSIS — F411 Generalized anxiety disorder: Secondary | ICD-10-CM

## 2022-08-15 NOTE — Telephone Encounter (Signed)
MyChart response to patient.   Routing to Dr. Quincy Simmonds for final review.

## 2022-08-16 ENCOUNTER — Encounter: Payer: Self-pay | Admitting: Obstetrics and Gynecology

## 2022-08-16 ENCOUNTER — Encounter: Payer: Self-pay | Admitting: Internal Medicine

## 2022-08-16 MED ORDER — DICYCLOMINE HCL 10 MG PO CAPS: 10.0000 mg | ORAL_CAPSULE | Freq: Four times a day (QID) | ORAL | 5 refills | Status: AC

## 2022-08-16 NOTE — Telephone Encounter (Signed)
Pt reports just ending menses.   Pt reports intermittent spasm like sharp pains ~q 30 secs. Pt states that typically 600mg  ibuprofen and/or heating pad doesn't relieve the pains. However, they are specifically debilitating enough for her to feel as if she needed to be seen urgently. Has known hx of ovarian cysts.  I advised pt that Dr. Quincy Simmonds will more than likely want her to have an [place] but will go ahead and get her scheduled for the first available office visit for an evaluation and will forward to Dr. Quincy Simmonds to see if she feels that pt can have US done here in office or earlier at another location. Pt voiced understanding and is happy with plan.   Pt is scheduled for 09/03/2022.  Please advise. Thanks.

## 2022-08-18 NOTE — Telephone Encounter (Signed)
Please schedule pelvic ultrasound at our office or at [facility].

## 2022-08-19 ENCOUNTER — Other Ambulatory Visit: Payer: Self-pay | Admitting: Internal Medicine

## 2022-08-19 ENCOUNTER — Other Ambulatory Visit: Payer: Self-pay

## 2022-08-19 DIAGNOSIS — R102 Pelvic and perineal pain: Secondary | ICD-10-CM

## 2022-08-19 MED ORDER — ROSUVASTATIN CALCIUM 20 MG PO TABS: ORAL_TABLET | ORAL | 1 refills | Status: DC

## 2022-08-19 MED ORDER — ROSUVASTATIN CALCIUM 20 MG PO TABS: ORAL_TABLET | ORAL | 1 refills | Status: AC

## 2022-08-19 MED ORDER — IBUPROFEN 600 MG PO TABS: 600.0000 mg | ORAL_TABLET | Freq: Three times a day (TID) | ORAL | 2 refills | Status: AC | PRN

## 2022-08-19 NOTE — Telephone Encounter (Signed)
Medical refill is send to provider for approval

## 2022-08-20 ENCOUNTER — Encounter: Payer: Self-pay | Admitting: Obstetrics and Gynecology

## 2022-08-20 DIAGNOSIS — G5602 Carpal tunnel syndrome, left upper limb: Secondary | ICD-10-CM | POA: Diagnosis not present

## 2022-08-22 ENCOUNTER — Telehealth (HOSPITAL_BASED_OUTPATIENT_CLINIC_OR_DEPARTMENT_OTHER): Payer: 59 | Admitting: Psychiatry

## 2022-08-22 ENCOUNTER — Encounter (HOSPITAL_COMMUNITY): Payer: Self-pay | Admitting: Psychiatry

## 2022-08-22 DIAGNOSIS — F3181 Bipolar II disorder: Secondary | ICD-10-CM | POA: Diagnosis not present

## 2022-08-22 DIAGNOSIS — F3281 Premenstrual dysphoric disorder: Secondary | ICD-10-CM

## 2022-08-22 DIAGNOSIS — F411 Generalized anxiety disorder: Secondary | ICD-10-CM

## 2022-08-22 DIAGNOSIS — F5000 Anorexia nervosa, unspecified: Secondary | ICD-10-CM | POA: Diagnosis not present

## 2022-08-22 DIAGNOSIS — M5442 Lumbago with sciatica, left side: Secondary | ICD-10-CM | POA: Diagnosis not present

## 2022-08-22 DIAGNOSIS — G4700 Insomnia, unspecified: Secondary | ICD-10-CM

## 2022-08-22 MED ORDER — LAMOTRIGINE 150 MG PO TABS: 300.0000 mg | ORAL_TABLET | Freq: Every morning | ORAL | 0 refills | Status: AC

## 2022-08-22 MED ORDER — ZOLPIDEM TARTRATE 5 MG PO TABS: 5.0000 mg | ORAL_TABLET | Freq: Every evening | ORAL | 0 refills | Status: AC | PRN

## 2022-08-22 MED ORDER — VILAZODONE HCL 20 MG PO TABS: 20.0000 mg | ORAL_TABLET | Freq: Every day | ORAL | 0 refills | Status: AC

## 2022-08-22 NOTE — Progress Notes (Deleted)
GYNECOLOGY  VISIT   HPI: 47 y.o.   Divorced  Caucasian  female   [id number] with No LMP recorded.   here for   U/S consult  GYNECOLOGIC HISTORY: No LMP recorded. Contraception:  abstinence Menopausal hormone therapy:  n/a Last mammogram:  07/08/22 Breast Density Category D, BI-RADS CATEGORY 1 Neg, L breast Last pap smear:   09/19/17 negative: HPV not detected, 11/08/13 negative         OB History     Gravida  2   Para      Term      Preterm      AB  2   Living         SAB  2   IAB      Ectopic      Multiple      Live Births                 Patient Active Problem List   Diagnosis Date Noted   OSA (obstructive sleep apnea)    Pain in thoracic spine 02/12/2022   Chronic low back pain 11/01/2021   Degeneration of lumbar intervertebral disc 11/01/2021   Deviated nasal septum 08/29/2021   Hypersomnolence 08/29/2021   Incisional hernia, without obstruction or gangrene [id number]   Periumbilical abdominal pain 05/19/2021   Ulnar neuropathy 03/05/2021   Cellulitis of right leg 03/05/2021   Ulnar nerve entrapment at elbow 02/12/2021   Cellulitis of great toe, right 02/08/2021   Carpal tunnel syndrome of right wrist 11/24/2020   Gastroesophageal reflux disease 10/26/2020   Inflammatory arthritis 10/26/2020   Scoliosis 10/26/2020   Urinary frequency 09/27/2020   COVID 08/31/2020   Weight loss 02/21/2020   Encounter for orthopedic follow-up care 12/09/2019   Rheumatoid arthritis ([place]) 11/24/2019   Right wrist pain 07/29/2019   Right hand pain 07/29/2019   Intractable headache 04/14/2019   Hyperglycemia 02/18/2019   HLD (hyperlipidemia) 02/16/2018   Migraine 02/16/2018   Encounter for well adult exam with abnormal findings 05/21/2017   UTI (urinary tract infection) 05/21/2017   Thrush 05/21/2017   Vitamin D deficiency 05/21/2017   Osteopenia 05/21/2017   Dyspnea 05/21/2017   Hematuria 01/01/2017   Lump in female breast 01/01/2017   Fatigue 01/01/2017    Candida vaginitis 01/01/2017   Dysuria 12/19/2016   Gastroesophageal reflux disease with esophagitis 11/14/2016   Irritable bowel syndrome with constipation 11/14/2016   Severe episode of recurrent major depressive disorder, without psychotic features ([place]) 08/24/2015   Cough 11/15/2014   Major depressive disorder, recurrent episode, moderate ([place]) 07/14/2014   ADD (attention deficit hyperactivity disorder, inattentive type) 07/14/2014   Impulse control disorder 07/14/2014   Pain of upper abdomen 05/09/2014   Depression 03/08/2014   GAD (generalized anxiety disorder) 03/08/2014   Anorexia nervosa 11/17/2013   Impulse control disorder, unspecified 11/17/2013   Cannabis abuse 11/17/2013   Insomnia 11/17/2013    Past Medical History:  Diagnosis Date   Abdominal hernia    ADD (attention deficit disorder)    Allergy    Anorexia nervosa without bulimia    Chronic constipation    Drug abuse ([place])    GAD (generalized anxiety disorder)    Galactorrhea 07/2022   Elevated prolactin.  On Zyprexa.   GERD (gastroesophageal reflux disease)    History of gastric restrictive surgery    History of gastric ulcer    History of Helicobacter pylori infection    after 2003   History of herniated intervertebral disc  HLD (hyperlipidemia) 02/16/2018   Hx of migraine headaches    Impulse control disorder    Moderate major depression ([place])    Osteopenia    Osteoporosis    Post concussion syndrome    fell 10/20/2015   Sleep apnea    SMAS (superior mesenteric artery syndrome) (H[facility])    Ulnar neuropathy    Urge urinary incontinence     Past Surgical History:  Procedure Laterality Date   CARPAL TUNNEL RELEASE Right    Ulnar repair   DILATION AND CURETTAGE OF UTERUS  08/06/2003   INCISIONAL HERNIA REPAIR N/A 01/25/2022   Procedure: OPEN INCISIONAL HERNIA REPAIR;  Surgeon: Kieth Brightly Arta Bruce, MD;  Location: [facility];  Service: General;  Laterality: N/A;   INTERSTIM IMPLANT PLACEMENT N/A  10/18/2015   Procedure: Barrie Lyme IMPLANT FIRST STAGE;  Surgeon: Festus Aloe, MD;  Location: [facility];  Service: Urology;  Laterality: N/A;   INTERSTIM IMPLANT PLACEMENT N/A 10/18/2015   Procedure: Barrie Lyme IMPLANT SECOND STAGE;  Surgeon: Festus Aloe, MD;  Location: [facility];  Service: Urology;  Laterality: N/A;   LAPAROSCOPIC GASTRIC RESTRICTIVE DUODENAL PROCEDURE (DUODENAL SWITCH)  07/05/2002   ORIF RIGHT WRIST FX  08/06/2011   REMOVAL HARDWARE X2 in  2013--  No retained hardware    Current Outpatient Medications  Medication Sig Dispense Refill   bismuth subsalicylate (PEPTO BISMOL) 262 MG chewable tablet Chew 524 mg by mouth 4 (four) times daily as needed for diarrhea or loose stools or indigestion.     dicyclomine (BENTYL) 10 MG capsule Take 1 capsule (10 mg total) by mouth in the morning, at noon, in the evening, and at bedtime. 120 capsule 5   Erenumab-aooe (AIMOVIG) 140 MG/ML SOAJ Inject 140 mg into the skin every 30 (thirty) days. 1.12 mL 3   hydrOXYzine (VISTARIL) 50 MG capsule TAKE 1 TO 2 CAPSULES BY MOUTH EVERY DAY AS NEEDED FOR ANXIETY 60 capsule 0   ibuprofen (ADVIL) 600 MG tablet Take 1 tablet (600 mg total) by mouth every 8 (eight) hours as needed. 60 tablet 2   lamoTRIgine (LAMICTAL) 150 MG tablet Take 2 tablets (300 mg total) by mouth in the morning. 180 tablet 0   OLANZapine (ZYPREXA) 15 MG tablet Take 1 tablet (15 mg total) by mouth at bedtime. 90 tablet 0   ondansetron (ZOFRAN) 8 MG tablet Take 8 mg by mouth every 8 (eight) hours as needed for nausea or vomiting.     ondansetron (ZOFRAN-ODT) 4 MG disintegrating tablet TAKE 1 TABLET BY MOUTH EVERY 8 HOURS AS NEEDED FOR NAUSEA AND VOMITING 40 tablet 1   promethazine (PHENERGAN) 25 MG tablet Take 1 tablet (25 mg total) by mouth every 6 (six) hours as needed for nausea or vomiting. (Patient taking differently: Take 25 mg by mouth See admin instructions. Take 1 tablet (25 mg) by mouth  in the morning & take 1 tablet (25 mg) by mouth before supper as needed. May take up to two additional doses if needed for nausea/vomiting.) 90 tablet 5   rizatriptan (MAXALT-MLT) 10 MG disintegrating tablet DISSOLVE 1 TABLET BY MOUTH AS NEEDED FOR MIGRAINE,MAY REPEAT IN 2 HOURS IF NEEDED 10 tablet 5   rosuvastatin (CRESTOR) 20 MG tablet TAKE 1 TABLET(20 MG) BY MOUTH DAILY 90 tablet 1   Ubrogepant (UBRELVY) 100 MG TABS Take 100 mg by mouth as needed (Take 1 tablet at the earlist on set of a Migraine. Max 2 tablets in a 24 hour period). 16 tablet 5  No current facility-administered medications for this visit.     ALLERGIES: Bactrim [sulfamethoxazole-trimethoprim], Ciprofloxacin, Penicillins, Monistat [miconazole], Morphine and related, Reglan [metoclopramide], Trimethoprim, Benadryl [diphenhydramine hcl], Diflucan [fluconazole], and Fioricet [butalbital-apap-caffeine]  Family History  Problem Relation Age of Onset   Alcohol abuse Mother    Anxiety disorder Mother    Depression Mother        severe with hx of suicide attempt. treated with ECT   COPD Mother    Cancer Mother        Cervical   Stroke Mother    Anxiety disorder Father    Drug abuse Father    COPD Father    Alcohol abuse Maternal Uncle    Anxiety disorder Maternal Grandmother    Depression Maternal Grandmother    ADD / ADHD Brother    Breast cancer Paternal Grandmother 53   Stomach cancer Neg Hx    Colon cancer Neg Hx    Rectal cancer Neg Hx    Esophageal cancer Neg Hx     Social History   Socioeconomic History   Marital status: Divorced    Spouse name: Not on file   Number of children: 0   Years of education: 14   Highest education level: Not on file  Occupational History   Occupation: Disability  Tobacco Use   Smoking status: Former    Packs/day: 0.50    Years: 1.00    Total pack years: 0.50    Types: Cigarettes    Quit date: 12/03/2016    Years since quitting: 5.7   Smokeless tobacco: Never   Tobacco  comments:    quit smoking in 2018  Vaping Use   Vaping Use: Never used  Substance and Sexual Activity   Alcohol use: No   Drug use: Yes    Types: Marijuana    Comment: using 1x/week   Sexual activity: Not Currently    Comment: 1st intercourse 47 yo-Fewer than 5 partners  Other Topics Concern   Not on file  Social History Narrative   Fun: Play piano; Build Lego   Denies abuse and feels safe at home. Living with friend in [place].    Right handed    Drink coffee 1 cup eveyday   Leave with 2 friends   1 story home, 16 steps   Social Determinants of Health   Financial Resource Strain: Low Risk  (11/06/2021)   Overall Financial Resource Strain (CARDIA)    Difficulty of Paying Living Expenses: Not hard at all  Food Insecurity: No Food Insecurity (11/06/2021)   Hunger Vital Sign    Worried About Running Out of Food in the Last Year: Never true    Ran Out of Food in the Last Year: Never true  Transportation Needs: No Transportation Needs (11/06/2021)   PRAPARE - Hydrologist (Medical): No    Lack of Transportation (Non-Medical): No  Physical Activity: Inactive (11/06/2021)   Exercise Vital Sign    Days of Exercise per Week: 0 days    Minutes of Exercise per Session: 0 min  Stress: No Stress Concern Present (11/06/2021)   [place]    Feeling of Stress : Not at all  Social Connections: Unknown (11/06/2021)   Social Connection and Isolation Panel [NHANES]    Frequency of Communication with Friends and Family: More than three times a week    Frequency of Social Gatherings with Friends and Family: More than three times  a week    Attends Religious Services: Patient refused    Active Member of Clubs or Organizations: Patient refused    Attends Archivist Meetings: Patient refused    Marital Status: Divorced  Human resources officer Violence: Not At Risk (11/06/2021)   Humiliation, Afraid,  Rape, and Kick questionnaire    Fear of Current or Ex-Partner: No    Emotionally Abused: No    Physically Abused: No    Sexually Abused: No    Review of Systems  PHYSICAL EXAMINATION:    There were no vitals taken for this visit.    General appearance: alert, cooperative and appears stated age Head: Normocephalic, without obvious abnormality, atraumatic Neck: no adenopathy, supple, symmetrical, trachea midline and thyroid normal to inspection and palpation Lungs: clear to auscultation bilaterally Breasts: normal appearance, no masses or tenderness, No nipple retraction or dimpling, No nipple discharge or bleeding, No axillary or supraclavicular adenopathy Heart: regular rate and rhythm Abdomen: soft, non-tender, no masses,  no organomegaly Extremities: extremities normal, atraumatic, no cyanosis or edema Skin: Skin color, texture, turgor normal. No rashes or lesions Lymph nodes: Cervical, supraclavicular, and axillary nodes normal. No abnormal inguinal nodes palpated Neurologic: Grossly normal  Pelvic: External genitalia:  no lesions              Urethra:  normal appearing urethra with no masses, tenderness or lesions              Bartholins and Skenes: normal                 Vagina: normal appearing vagina with normal color and discharge, no lesions              Cervix: no lesions                Bimanual Exam:  Uterus:  normal size, contour, position, consistency, mobility, non-tender              Adnexa: no mass, fullness, tenderness              Rectal exam: {yes no:[id number]}.  Confirms.              Anus:  normal sphincter tone, no lesions  Chaperone was present for exam:  ***  ASSESSMENT     PLAN     An After Visit Summary was printed and given to the patient.  ______ minutes face to face time of which over 50% was spent in counseling.

## 2022-08-22 NOTE — Progress Notes (Signed)
Virtual Visit via Video Note  I connected with Karen Hahn on 08/22/22 at  2:00 PM EST by  a video enabled telemedicine application and verified that I am speaking with the correct person using two identifiers.  Location: Patient: home Provider: office   I discussed the limitations of evaluation and management by telemedicine and the availability of in person appointments. The patient expressed understanding and agreed to proceed.  History of Present Illness: Karen Hahn had carpel tunnel surgery 2 days ago and is slowly recovering. Things at home are still not very good. They are not fighting and have improved a minute amount. Karen Hahn is very irritable and annoyed constantly. Karen Hahn is not sure why but thinks it is due to the stress of dealing with the people in the house. It starts as soon as she wakes up. She went to [employer] yesterday and had her 1st therapy appointment yesterday. She likes it and felt comfortable with the therapist and the next appointment is in 3 weeks. They talked a lot about what is going on. Karen Hahn really doesn't want to be in the house anymore. She is depressed and she has low motivation. She is not wanting to go out as much and is not doing her makeup anymore. Karen Hahn does engage in good personal hygiene. For the past couple of weeks her depression has been constant. Her sleep is poor and she wakes with every sound. The Vistaril is not helping. She is exhausted most days and can sometimes nap. Her appetite is ok but she is not eating breakfast. She denies negative self talk. Her focus and concentration are ok. Karen Hahn has high anxiety. She has inner restlessness that is very uncomfortable. It is daily and happens thru out the day. She denies SI/HI. Pt denies recent manic and hypomanic symptoms including periods of decreased need for sleep, increased energy, mood lability, impulsivity, FOI, and excessive spending. Her mood symptoms gets worse around her menstrual cycle. Her  Prolactin level has been high and she has an MRI scheduled for this Saturday.     Observations/Objective: Psychiatric Specialty Exam: ROS  There were no vitals taken for this visit.There is no height or weight on file to calculate BMI.  General Appearance: Casual  Eye Contact:  Good  Speech:  Clear and Coherent and Normal Rate  Volume:  Normal  Mood:  Anxious, Depressed, and Irritable  Affect:  Congruent  Thought Process:  Goal Directed, Linear, and Descriptions of Associations: Intact  Orientation:  Full (Time, Place, and Person)  Thought Content:  Logical  Suicidal Thoughts:  No  Homicidal Thoughts:  No  Memory:  Immediate;   Good  Judgement:  Good  Insight:  Good  Psychomotor Activity:  Normal  Concentration:  Concentration: Good  Recall:  Good  Fund of Knowledge:  Good  Language:  Good  Akathisia:  No  Handed:  Right  AIMS (if indicated):     Assets:  Communication Skills Desire for Improvement Financial Resources/Insurance Housing Resilience Talents/Skills Transportation Vocational/Educational  ADL's:  Intact  Cognition:  WNL  Sleep:        Assessment and Plan:     08/22/2022    2:06 PM 08/09/2022    3:10 PM 06/20/2022    3:48 PM 02/27/2022    4:02 PM 11/29/2021    4:01 PM  Depression screen PHQ 2/9  Decreased Interest 3 0 3 0 0  Down, Depressed, Hopeless 3 0 3 0 1  PHQ - 2 Score 6 0  6 0 1  Altered sleeping 3  0 0 1  Tired, decreased energy 3  1 0 3  Change in appetite 0  3 0 0  Feeling bad or failure about yourself  0  3 0   Trouble concentrating 1  1 0 3  Moving slowly or fidgety/restless 0  0 0 0  Suicidal thoughts 0  0 0 0  PHQ-9 Score 13  14 0 8  Difficult doing work/chores Very difficult  Very difficult  Very difficult    Flowsheet Row Video Visit from 08/22/2022 in [facility] Video Visit from 06/20/2022 in [facility] from 05/22/2022 in [facility] No Risk No Risk No Risk          Pt is aware that these meds carry a teratogenic risk. Pt will discuss plan of action if she does or plans to become pregnant in the future.  Status of current problems: ongoing irritability and worsening depression   Medication management with supportive therapy. Risks and benefits, side effects and alternative treatment options discussed with patient. Pt was given an opportunity to ask questions about medication, illness, and treatment. All current psychiatric medications have been reviewed and discussed with the patient and adjusted as clinically appropriate.  Pt verbalized understanding and verbal consent obtained for treatment.  Meds:  d/c Vistaril D/c Zyprexa  Start Viibryd 20mg  po qD Start Ambien 5mg  po qHS prn insomnia 1. Bipolar II disorder (HCC) - lamoTRIgine (LAMICTAL) 150 MG tablet; Take 2 tablets (300 mg total) by mouth in the morning.  Dispense: 180 tablet; Refill: 0 - Vilazodone HCl (VIIBRYD) 20 MG TABS; Take 1 tablet (20 mg total) by mouth daily.  Dispense: 30 tablet; Refill: 0  2. PMDD (premenstrual dysphoric disorder) - lamoTRIgine (LAMICTAL) 150 MG tablet; Take 2 tablets (300 mg total) by mouth in the morning.  Dispense: 180 tablet; Refill: 0  3. GAD (generalized anxiety disorder)  4. Insomnia, unspecified type - zolpidem (AMBIEN) 5 MG tablet; Take 1 tablet (5 mg total) by mouth at bedtime as needed for sleep.  Dispense: 30 tablet; Refill: 0  5. Anorexia nervosa     Labs: none    Therapy: brief supportive therapy provided. Discussed psychosocial stressors in detail.      Collaboration of Care: Referral or follow-up with counselor/therapist AEB therapy  Patient/Guardian was advised Release of Information must be obtained prior to any record release in order to collaborate their care with an outside provider. Patient/Guardian was advised if they have not already done  so to contact the registration department to sign all necessary forms in order for [place] to release information regarding their care.   Consent: Patient/Guardian gives verbal consent for treatment and assignment of benefits for services provided during this visit. Patient/Guardian expressed understanding and agreed to proceed.      Follow Up Instructions: Follow up in 2 weeks or sooner if needed    I discussed the assessment and treatment plan with the patient. The patient was provided an opportunity to ask questions and all were answered. The patient agreed with the plan and demonstrated an understanding of the instructions.   The patient was advised to call back or seek an in-person evaluation if the symptoms worsen or if the condition fails to improve as anticipated.  I provided 27 minutes of non-face-to-face time during this encounter.   Charlcie Cradle, MD

## 2022-08-24 ENCOUNTER — Encounter: Payer: Self-pay | Admitting: Obstetrics and Gynecology

## 2022-08-24 ENCOUNTER — Ambulatory Visit
Admission: RE | Admit: 2022-08-24 | Discharge: 2022-08-24 | Disposition: A | Discharge status: Home / Self Care | Payer: 59 | Source: Ambulatory Visit | Attending: Obstetrics and Gynecology | Admitting: Obstetrics and Gynecology

## 2022-08-24 DIAGNOSIS — R7989 Other specified abnormal findings of blood chemistry: Secondary | ICD-10-CM

## 2022-08-24 DIAGNOSIS — N643 Galactorrhea not associated with childbirth: Secondary | ICD-10-CM

## 2022-08-26 NOTE — Telephone Encounter (Signed)
If the patient is not able to have an MRI of the brain done, I will need a recommendation from the radiologist of an adequate alternative for imaging to rule out prolactinoma due to galactorrhea.

## 2022-08-26 NOTE — Telephone Encounter (Signed)
Routing to Dr. Quincy Simmonds to review and advise.   MR Brain w/wo contrast ordered 07/25/22.

## 2022-08-27 NOTE — Telephone Encounter (Signed)
Spoke with Dr. Shearon Stalls at [facility].  Recommendations provided to Dr. Quincy Simmonds by staff message.    Routing to Dr. Quincy Simmonds.

## 2022-08-28 ENCOUNTER — Encounter: Payer: Self-pay | Admitting: Internal Medicine

## 2022-08-28 ENCOUNTER — Other Ambulatory Visit: Payer: Self-pay | Admitting: Internal Medicine

## 2022-08-28 ENCOUNTER — Ambulatory Visit (INDEPENDENT_AMBULATORY_CARE_PROVIDER_SITE_OTHER)
Admission: RE | Admit: 2022-08-28 | Discharge: 2022-08-28 | Disposition: A | Discharge status: Home / Self Care | Payer: 59 | Source: Ambulatory Visit

## 2022-08-28 ENCOUNTER — Ambulatory Visit (INDEPENDENT_AMBULATORY_CARE_PROVIDER_SITE_OTHER): Payer: 59 | Admitting: Internal Medicine

## 2022-08-28 VITALS — BP 102/62 | HR 100 | Temp 98.7°F | Ht 62.0 in | Wt 100.0 lb

## 2022-08-28 DIAGNOSIS — E559 Vitamin D deficiency, unspecified: Secondary | ICD-10-CM | POA: Diagnosis not present

## 2022-08-28 DIAGNOSIS — Z0001 Encounter for general adult medical examination with abnormal findings: Secondary | ICD-10-CM | POA: Diagnosis not present

## 2022-08-28 DIAGNOSIS — E538 Deficiency of other specified B group vitamins: Secondary | ICD-10-CM | POA: Diagnosis not present

## 2022-08-28 DIAGNOSIS — R739 Hyperglycemia, unspecified: Secondary | ICD-10-CM

## 2022-08-28 DIAGNOSIS — R634 Abnormal weight loss: Secondary | ICD-10-CM | POA: Diagnosis not present

## 2022-08-28 DIAGNOSIS — E2839 Other primary ovarian failure: Secondary | ICD-10-CM

## 2022-08-28 DIAGNOSIS — F331 Major depressive disorder, recurrent, moderate: Secondary | ICD-10-CM

## 2022-08-28 DIAGNOSIS — E7800 Pure hypercholesterolemia, unspecified: Secondary | ICD-10-CM | POA: Diagnosis not present

## 2022-08-28 DIAGNOSIS — G971 Other reaction to spinal and lumbar puncture: Secondary | ICD-10-CM | POA: Insufficient documentation

## 2022-08-28 LAB — CBC WITH DIFFERENTIAL/PLATELET
Basophils Absolute: 0 10*3/uL (ref 0.0–0.1)
Basophils Relative: 0.6 % (ref 0.0–3.0)
Eosinophils Absolute: 0 10*3/uL (ref 0.0–0.7)
Eosinophils Relative: 0.6 % (ref 0.0–5.0)
HCT: 37.5 % (ref 36.0–46.0)
Hemoglobin: 12.8 g/dL (ref 12.0–15.0)
Lymphocytes Relative: 23.4 % (ref 12.0–46.0)
Lymphs Abs: 1.5 10*3/uL (ref 0.7–4.0)
MCHC: 34.1 g/dL (ref 30.0–36.0)
MCV: 87.8 fl (ref 78.0–100.0)
Monocytes Absolute: 0.3 10*3/uL (ref 0.1–1.0)
Monocytes Relative: 4.8 % (ref 3.0–12.0)
Neutro Abs: 4.6 10*3/uL (ref 1.4–7.7)
Neutrophils Relative %: 70.6 % (ref 43.0–77.0)
Platelets: 267 10*3/uL (ref 150.0–400.0)
RBC: 4.27 Mil/uL (ref 3.87–5.11)
RDW: 12.3 % (ref 11.5–15.5)
WBC: 6.5 10*3/uL (ref 4.0–10.5)

## 2022-08-28 LAB — LIPID PANEL
Cholesterol: 229 mg/dL — ABNORMAL HIGH (ref 0–200)
HDL: 59.4 mg/dL (ref >39.00)
LDL Cholesterol: 156 mg/dL — ABNORMAL HIGH (ref 0–99)
NonHDL: 169.38
Total CHOL/HDL Ratio: 4
Triglycerides: 67 mg/dL (ref 0.0–149.0)
VLDL: 13.4 mg/dL (ref 0.0–40.0)

## 2022-08-28 LAB — URINALYSIS, ROUTINE W REFLEX MICROSCOPIC
Bilirubin Urine: NEGATIVE
Hgb urine dipstick: NEGATIVE
Ketones, ur: >=80 — AB
Leukocytes,Ua: NEGATIVE
Nitrite: NEGATIVE
Specific Gravity, Urine: 1.025 (ref 1.000–1.030)
Total Protein, Urine: NEGATIVE
Urine Glucose: NEGATIVE
Urobilinogen, UA: 2 — AB (ref 0.0–1.0)
pH: 6.5 (ref 5.0–8.0)

## 2022-08-28 LAB — HEPATIC FUNCTION PANEL
ALT: 10 U/L (ref 0–35)
AST: 13 U/L (ref 0–37)
Albumin: 4.8 g/dL (ref 3.5–5.2)
Alkaline Phosphatase: 62 U/L (ref 39–117)
Bilirubin, Direct: 0.1 mg/dL (ref 0.0–0.3)
Total Bilirubin: 0.5 mg/dL (ref 0.2–1.2)
Total Protein: 7.6 g/dL (ref 6.0–8.3)

## 2022-08-28 LAB — TSH: TSH: 0.57 u[IU]/mL (ref 0.35–5.50)

## 2022-08-28 LAB — BASIC METABOLIC PANEL
BUN: 12 mg/dL (ref 6–23)
CO2: 26 mEq/L (ref 19–32)
Calcium: 9.5 mg/dL (ref 8.4–10.5)
Chloride: 102 mEq/L (ref 96–112)
Creatinine, Ser: 0.67 mg/dL (ref 0.40–1.20)
GFR: 106.55 mL/min (ref >60.00)
Glucose, Bld: 139 mg/dL — ABNORMAL HIGH (ref 70–99)
Potassium: 3.7 mEq/L (ref 3.5–5.1)
Sodium: 138 mEq/L (ref 135–145)

## 2022-08-28 LAB — VITAMIN D 25 HYDROXY (VIT D DEFICIENCY, FRACTURES): VITD: 18.59 ng/mL — ABNORMAL LOW (ref 30.00–100.00)

## 2022-08-28 LAB — VITAMIN B12: Vitamin B-12: 553 pg/mL (ref 211–911)

## 2022-08-28 LAB — HEMOGLOBIN A1C: Hgb A1c MFr Bld: 5.1 % (ref 4.6–6.5)

## 2022-08-28 MED ORDER — ALENDRONATE SODIUM 70 MG PO TABS: 70.0000 mg | ORAL_TABLET | ORAL | 3 refills | Status: AC

## 2022-08-28 NOTE — Assessment & Plan Note (Signed)
Last vitamin D Lab Results  Component Value Date   VD25OH 18.23 (L) 11/13/2021   Low, to start oral replacement

## 2022-08-28 NOTE — Assessment & Plan Note (Signed)
Likely related to depresison, exam o/w benign, encouraged OTC diet supplements

## 2022-08-28 NOTE — Assessment & Plan Note (Signed)
Now s/p recent change remeron to Viibryd, pt knows will need several wks to be effective, cont same tx, f/u psychiatry as planned

## 2022-08-28 NOTE — Patient Instructions (Signed)
Please schedule the bone density test before leaving today at the scheduling desk (where you check out)  Please continue all other medications as before, and refills have been done if requested.  Please have the pharmacy call with any other refills you may need.  Please continue your efforts at being more active, low cholesterol diet, and weight control.  You are otherwise up to date with prevention measures today.  Please keep your appointments with your specialists as you may have planned  Please go to the LAB at the blood drawing area for the tests to be done  You will be contacted by phone if any changes need to be made immediately.  Otherwise, you will receive a letter about your results with an explanation, but please check with MyChart first.  Please remember to sign up for MyChart if you have not done so, as this will be important to you in the future with finding out test results, communicating by private email, and scheduling acute appointments online when needed.  Please make an Appointment to return in 6 months, or sooner if needed 

## 2022-08-28 NOTE — Telephone Encounter (Signed)
Spoke with patient, advised per Dr. Quincy Simmonds.  Patient request referral to [facility] endocrinology, first available provider. Order placed.  Advised patient their office will contact her directly to schedule. Patient verbalizes understanding and is agreeable.   Routing to provider for final review. Patient is agreeable to disposition. Will close encounter.   Cc: Wyatt Portela

## 2022-08-28 NOTE — Telephone Encounter (Signed)
Please contact patient with my recommendation for her to see endocrinology.   It is possible that the galactorrhea is due to her medication use.   I would like her to see an endocrinologist to make final recommendations regarding her care for the galactorrhea. I would cancel the order for the MRI at this time.  Providers could be Dr. Kelton Pillar, Dr. Dwyane Dee, or another endocrinologist of choice.

## 2022-08-28 NOTE — Assessment & Plan Note (Signed)
Lab Results  Component Value Date   HGBA1C 5.3 11/13/2021   Stable, pt to continue current medical treatment  - diet, wt control

## 2022-08-28 NOTE — Assessment & Plan Note (Addendum)
Age and sex appropriate education and counseling updated with regular exercise and diet Referrals for preventative services - for DXA Immunizations addressed - none needed Smoking counseling  - none needed Evidence for depression or other mood disorder - mod to severe depression anxiety now on new viibryd per psychiatry Most recent labs reviewed. I have personally reviewed and have noted: 1) the patient's medical and social history 2) The patient's current medications and supplements 3) The patient's height, weight, and BMI have been recorded in the chart

## 2022-08-28 NOTE — Progress Notes (Signed)
Patient ID: Karen Hahn, female   DOB: Jun 18, 1976, 47 y.o.   MRN: [id number]         Chief Complaint:: wellness exam and 96mo follow up (Tired and run down, wakes up at 4am and can't sleep the rest of the day, night sweats, decreased appetite )  , hld, hyperglycemia, major depression with recent wt loss, low vit d       HPI:  Karen Hahn is a 47 y.o. female here for wellness exam, o/w up to date except due for DXA               Also just recently s/p left CTS surgury 6 days ago, feeling better.  No longer taking remeron but Viibryd instead, and ambien for sleep but has ongoing social stress at home with the past wk with severe worsening anxiety dperession , night sweats  and ambien helps to get to sleep but wakes early 4am. Lost 9 lbs in 1 wk.  Has recent need for head MRI due to elevated prolactin and lactation, but unalbe so far due to presence of Interstim low back. Pt denies chest pain, increased sob or doe, wheezing, orthopnea, PND, increased LE swelling, palpitations, dizziness or syncope.   Pt denies polydipsia, polyuria, or new focal neuro s/s.    Pt denies fever, wt loss, night sweats, loss of appetite, or other constitutional symptoms      Wt Readings from Last 3 Encounters:  08/28/22 100 lb (45.4 kg)  08/09/22 103 lb (46.7 kg)  07/17/22 95 lb (43.1 kg)   BP Readings from Last 3 Encounters:  08/28/22 102/62  08/09/22 103/69  07/17/22 100/68   Immunization History  Administered Date(s) Administered   Influenza,inj,Quad PF,6+ Mos 06/12/2017, 05/04/2018   Influenza-Unspecified 06/02/2017   Pneumococcal Polysaccharide-23 03/05/2021   Tdap 05/21/2017   There are no preventive care reminders to display for this patient.     Past Medical History:  Diagnosis Date   Abdominal hernia    ADD (attention deficit disorder)    Allergy    Anorexia nervosa without bulimia    Chronic constipation    Drug abuse (HCC)    GAD (generalized anxiety disorder)    Galactorrhea 07/2022    Elevated prolactin.  On Zyprexa.   GERD (gastroesophageal reflux disease)    History of gastric restrictive surgery    History of gastric ulcer    History of Helicobacter pylori infection    after 2003   History of herniated intervertebral disc    HLD (hyperlipidemia) 02/16/2018   Hx of migraine headaches    Impulse control disorder    Moderate major depression (HCC)    Osteopenia    Osteoporosis    Post concussion syndrome    fell 10/20/2015   Sleep apnea    SMAS (superior mesenteric artery syndrome) (HCC)    Ulnar neuropathy    Urge urinary incontinence    Past Surgical History:  Procedure Laterality Date   CARPAL TUNNEL RELEASE Right    Ulnar repair   DILATION AND CURETTAGE OF UTERUS  08/06/2003   INCISIONAL HERNIA REPAIR N/A 01/25/2022   Procedure: OPEN INCISIONAL HERNIA REPAIR;  Surgeon: Sheliah Hatch De Blanch, MD;  Location: [facility];  Service: General;  Laterality: N/A;   INTERSTIM IMPLANT PLACEMENT N/A 10/18/2015   Procedure: [name] IMPLANT FIRST STAGE;  Surgeon: Jerilee Field, MD;  Location: [facility];  Service: Urology;  Laterality: N/A;   INTERSTIM IMPLANT PLACEMENT N/A 10/18/2015   Procedure: [name]  IMPLANT SECOND STAGE;  Surgeon: Festus Aloe, MD;  Location: [facility];  Service: Urology;  Laterality: N/A;   LAPAROSCOPIC GASTRIC RESTRICTIVE DUODENAL PROCEDURE (DUODENAL SWITCH)  07/05/2002   ORIF RIGHT WRIST FX  08/06/2011   REMOVAL HARDWARE X2 in  2013--  No retained hardware    reports that she quit smoking about 5 years ago. Her smoking use included cigarettes. She has a 0.50 pack-year smoking history. She has never used smokeless tobacco. She reports current drug use. Drug: Marijuana. She reports that she does not drink alcohol. family history includes ADD / ADHD in her brother; Alcohol abuse in her maternal uncle and mother; Anxiety disorder in her father, maternal grandmother, and mother; Breast cancer (age of onset:  76) in her paternal grandmother; COPD in her father and mother; Cancer in her mother; Depression in her maternal grandmother and mother; Drug abuse in her father; Stroke in her mother. Allergies  Allergen Reactions   Bactrim [Sulfamethoxazole-Trimethoprim] Other (See Comments)    Yeast infection   Ciprofloxacin Itching   Penicillins Other (See Comments)    Has patient had a PCN reaction causing immediate rash, facial/tongue/throat swelling, SOB or lightheadedness with hypotension: NO (UTI, YEAST INFECTION) Has patient had a PCN reaction causing severe rash involving mucus membranes or skin necrosis: NO Has patient had a PCN reaction that required hospitalization NO Has patient had a PCN reaction occurring within the last 10 years: NO If all of the above answers are "NO", then may proceed with Cephalosporin use.    Monistat [Miconazole] Swelling   Morphine And Related Itching and Other (See Comments)    "whole body feels tight feeling"   Reglan [Metoclopramide] Other (See Comments)    tachycardia   Trimethoprim    Benadryl [Diphenhydramine Hcl] Palpitations    tachycardia   Diflucan [Fluconazole] Rash   Fioricet [Butalbital-Apap-Caffeine] Rash   Current Outpatient Medications on File Prior to Visit  Medication Sig Dispense Refill   bismuth subsalicylate (PEPTO BISMOL) 262 MG chewable tablet Chew 524 mg by mouth 4 (four) times daily as needed for diarrhea or loose stools or indigestion.     dicyclomine (BENTYL) 10 MG capsule Take 1 capsule (10 mg total) by mouth in the morning, at noon, in the evening, and at bedtime. 120 capsule 5   doxycycline (VIBRAMYCIN) 100 MG capsule Take by mouth.     Erenumab-aooe (AIMOVIG) 140 MG/ML SOAJ Inject 140 mg into the skin every 30 (thirty) days. 1.12 mL 3   ibuprofen (ADVIL) 600 MG tablet Take 1 tablet (600 mg total) by mouth every 8 (eight) hours as needed. 60 tablet 2   lamoTRIgine (LAMICTAL) 150 MG tablet Take 2 tablets (300 mg total) by mouth in  the morning. 180 tablet 0   ondansetron (ZOFRAN) 8 MG tablet Take 8 mg by mouth every 8 (eight) hours as needed for nausea or vomiting.     ondansetron (ZOFRAN-ODT) 4 MG disintegrating tablet TAKE 1 TABLET BY MOUTH EVERY 8 HOURS AS NEEDED FOR NAUSEA AND VOMITING 40 tablet 1   oxyCODONE-acetaminophen (PERCOCET/ROXICET) 5-325 MG tablet Take 1 tablet 4 times a day by oral route as needed for 30 days.     PREVIDENT 5000 SENSITIVE 1.1-5 % GEL Place onto teeth.     promethazine (PHENERGAN) 25 MG tablet Take 1 tablet (25 mg total) by mouth every 6 (six) hours as needed for nausea or vomiting. (Patient taking differently: Take 25 mg by mouth See admin instructions. Take 1 tablet (25 mg) by  mouth in the morning & take 1 tablet (25 mg) by mouth before supper as needed. May take up to two additional doses if needed for nausea/vomiting.) 90 tablet 5   rizatriptan (MAXALT-MLT) 10 MG disintegrating tablet DISSOLVE 1 TABLET BY MOUTH AS NEEDED FOR MIGRAINE,MAY REPEAT IN 2 HOURS IF NEEDED 10 tablet 5   rosuvastatin (CRESTOR) 20 MG tablet TAKE 1 TABLET(20 MG) BY MOUTH DAILY 90 tablet 1   Ubrogepant (UBRELVY) 100 MG TABS Take 100 mg by mouth as needed (Take 1 tablet at the earlist on set of a Migraine. Max 2 tablets in a 24 hour period). 16 tablet 5   Vilazodone HCl (VIIBRYD) 20 MG TABS Take 1 tablet (20 mg total) by mouth daily. 30 tablet 0   zolpidem (AMBIEN) 5 MG tablet Take 1 tablet (5 mg total) by mouth at bedtime as needed for sleep. 30 tablet 0   No current facility-administered medications on file prior to visit.        ROS:  All others reviewed and negative.  Objective        PE:  BP 102/62 (BP Location: Right Arm, Patient Position: Sitting, Cuff Size: Normal)   Pulse 100   Temp 98.7 F (37.1 C) (Oral)   Ht 5\' 2"  (1.575 m)   Wt 100 lb (45.4 kg)   SpO2 95%   BMI 18.29 kg/m                 Constitutional: Pt appears in NAD but thin for ht               HENT: Head: NCAT.                Right Ear:  External ear normal.                 Left Ear: External ear normal.                Eyes: . Pupils are equal, round, and reactive to light. Conjunctivae and EOM are normal               Nose: without d/c or deformity               Neck: Neck supple. Gross normal ROM               Cardiovascular: Normal rate and regular rhythm.                 Pulmonary/Chest: Effort normal and breath sounds without rales or wheezing.                Abd:  Soft, NT, ND, + BS, no organomegaly               Neurological: Pt is alert. At baseline orientation, motor grossly intact               Skin: Skin is warm. No rashes, no other new lesions, LE edema - none               Psychiatric: Pt behavior is normal without agitation   Micro: none  Cardiac tracings I have personally interpreted today:  none  Pertinent Radiological findings (summarize): none   Lab Results  Component Value Date   WBC 6.0 11/13/2021   HGB 12.9 11/13/2021   HCT 37.6 11/13/2021   PLT 303.0 11/13/2021   GLUCOSE 91 11/13/2021   CHOL 197 11/13/2021   TRIG 96.0 11/13/2021   HDL 59.30 11/13/2021  LDLCALC 118 (H) 11/13/2021   ALT 13 11/13/2021   AST 15 11/13/2021   NA 140 11/13/2021   K 3.9 11/13/2021   CL 104 11/13/2021   CREATININE 0.71 11/13/2021   BUN 14 11/13/2021   CO2 28 11/13/2021   TSH 2.32 07/17/2022   HGBA1C 5.3 11/13/2021   Assessment/Plan:  Karen Hahn is a 47 y.o. White or Caucasian [1] female with  has a past medical history of Abdominal hernia, ADD (attention deficit disorder), Allergy, Anorexia nervosa without bulimia, Chronic constipation, Drug abuse (HCC), GAD (generalized anxiety disorder), Galactorrhea (07/2022), GERD (gastroesophageal reflux disease), History of gastric restrictive surgery, History of gastric ulcer, History of Helicobacter pylori infection, History of herniated intervertebral disc, HLD (hyperlipidemia) (02/16/2018), migraine headaches, Impulse control disorder, Moderate major depression  (HCC), Osteopenia, Osteoporosis, Post concussion syndrome, Sleep apnea, SMAS (superior mesenteric artery syndrome) (HCC), Ulnar neuropathy, and Urge urinary incontinence.  Encounter for well adult exam with abnormal findings Age and sex appropriate education and counseling updated with regular exercise and diet Referrals for preventative services - for DXA Immunizations addressed - none needed Smoking counseling  - none needed Evidence for depression or other mood disorder - mod to severe depression anxiety now on new viibryd per psychiatry Most recent labs reviewed. I have personally reviewed and have noted: 1) the patient's medical and social history 2) The patient's current medications and supplements 3) The patient's height, weight, and BMI have been recorded in the chart   HLD (hyperlipidemia) Lab Results  Component Value Date   LDLCALC 118 (H) 11/13/2021   Uncontrolled now on crestor 20 mg, , pt to continue current statin and f/u lipids    Hyperglycemia Lab Results  Component Value Date   HGBA1C 5.3 11/13/2021   Stable, pt to continue current medical treatment  - diet, wt control   Major depressive disorder, recurrent episode, moderate (HCC) Now s/p recent change remeron to Viibryd, pt knows will need several wks to be effective, cont same tx, f/u psychiatry as planned  Vitamin D deficiency Last vitamin D Lab Results  Component Value Date   VD25OH 18.23 (L) 11/13/2021   Low, to start oral replacement   Weight loss Likely related to depresison, exam o/w benign, encouraged OTC diet supplements  Followup: No follow-ups on file.  Oliver Barre, MD 08/28/2022 1:01 PM [facility]

## 2022-08-28 NOTE — Assessment & Plan Note (Signed)
Lab Results  Component Value Date   LDLCALC 118 (H) 11/13/2021   Uncontrolled now on crestor 20 mg, , pt to continue current statin and f/u lipids

## 2022-08-29 ENCOUNTER — Other Ambulatory Visit: Payer: 59

## 2022-08-29 ENCOUNTER — Other Ambulatory Visit: Payer: Self-pay

## 2022-08-29 MED ORDER — BISMUTH SUBSALICYLATE 262 MG PO CHEW
524.0000 mg | CHEWABLE_TABLET | Freq: Four times a day (QID) | ORAL | 3 refills | Status: AC | PRN
  Filled 2022-08-29: qty 30, 4d supply, fill #0

## 2022-08-30 ENCOUNTER — Ambulatory Visit
Admission: RE | Admit: 2022-08-30 | Discharge: 2022-08-30 | Disposition: A | Discharge status: Home / Self Care | Payer: 59 | Source: Ambulatory Visit | Attending: Obstetrics and Gynecology | Admitting: Obstetrics and Gynecology

## 2022-08-30 ENCOUNTER — Encounter: Payer: Self-pay | Admitting: Obstetrics and Gynecology

## 2022-08-30 DIAGNOSIS — R102 Pelvic and perineal pain: Secondary | ICD-10-CM

## 2022-08-30 LAB — LAMOTRIGINE LEVEL: Lamotrigine Lvl: 8.3 ug/mL (ref 2.5–15.0)

## 2022-09-03 ENCOUNTER — Encounter: Payer: Self-pay | Admitting: Obstetrics and Gynecology

## 2022-09-03 ENCOUNTER — Ambulatory Visit: Payer: 59 | Admitting: Obstetrics and Gynecology

## 2022-09-05 ENCOUNTER — Telehealth (HOSPITAL_COMMUNITY): Payer: 59 | Admitting: Psychiatry

## 2022-09-05 DIAGNOSIS — G5602 Carpal tunnel syndrome, left upper limb: Secondary | ICD-10-CM | POA: Diagnosis not present

## 2022-09-09 ENCOUNTER — Encounter: Payer: Self-pay | Admitting: Neurology

## 2022-09-09 ENCOUNTER — Encounter: Payer: Self-pay | Admitting: Internal Medicine

## 2022-09-12 ENCOUNTER — Telehealth (HOSPITAL_COMMUNITY): Payer: Self-pay | Admitting: Psychiatry

## 2022-09-12 ENCOUNTER — Telehealth (HOSPITAL_COMMUNITY): Payer: 59 | Admitting: Psychiatry

## 2022-09-12 NOTE — Telephone Encounter (Signed)
09/12/22 9:13am Spoke with pt due to provider is sick - pt stated that she is moving to [place] leaving Sunday 09/15/22 and was under the impression that Dr. Doyne Keel could still be her provider - explained that she would have to find a provider in [place] since she is leaving [place], pt understood. Transferred pt to the nurse for medication refills.Marland KitchenMariana Hahn

## 2022-09-12 NOTE — Telephone Encounter (Signed)
Refer to [place] from 08/24/2022. Will close encounter.

## 2022-09-18 DIAGNOSIS — M545 Low back pain, unspecified: Secondary | ICD-10-CM | POA: Diagnosis not present

## 2022-09-18 DIAGNOSIS — M47814 Spondylosis without myelopathy or radiculopathy, thoracic region: Secondary | ICD-10-CM | POA: Diagnosis not present

## 2022-09-18 DIAGNOSIS — G894 Chronic pain syndrome: Secondary | ICD-10-CM | POA: Diagnosis not present

## 2022-09-18 DIAGNOSIS — M47817 Spondylosis without myelopathy or radiculopathy, lumbosacral region: Secondary | ICD-10-CM | POA: Diagnosis not present

## 2022-09-27 DIAGNOSIS — M5459 Other low back pain: Secondary | ICD-10-CM | POA: Diagnosis not present

## 2022-09-27 DIAGNOSIS — Z79891 Long term (current) use of opiate analgesic: Secondary | ICD-10-CM | POA: Diagnosis not present

## 2022-09-27 DIAGNOSIS — M7918 Myalgia, other site: Secondary | ICD-10-CM | POA: Diagnosis not present

## 2022-09-27 DIAGNOSIS — Z79899 Other long term (current) drug therapy: Secondary | ICD-10-CM | POA: Diagnosis not present

## 2022-09-30 ENCOUNTER — Other Ambulatory Visit: Payer: Self-pay

## 2022-10-07 ENCOUNTER — Ambulatory Visit: Payer: Medicare Other | Admitting: Neurology

## 2022-12-05 ENCOUNTER — Ambulatory Visit: Payer: Medicare Other | Admitting: Obstetrics and Gynecology

## 2022-12-09 ENCOUNTER — Encounter (HOSPITAL_COMMUNITY): Payer: Self-pay

## 2023-02-26 ENCOUNTER — Ambulatory Visit: Payer: 59 | Admitting: Internal Medicine

## 2023-03-19 NOTE — Progress Notes (Deleted)
HPI F former smoker followed for Mild OSA, complicated by Migraine, Nasal Septal Deviation, IBS/Constipation, GERD, Superior Mesenteric Artery Syndrome, Arthritis, Osteopenia, Rheumatoid Arthritis, Scoliosis, ADHD, BiPolar 2, Insomnia, Anorexia Nervosa, Cannabis, Depression, Anxiety, Gastric Restrictive Surgery, Hyperlipidemia,  HST 11/20/21- AHI 7.5/ hr, desaturation to 92%, body weight 106 lbs  =====================================================   03/19/22- 45 yoF former smoker followed for Mild OSA, complicated by Migraine, Nasal Septal Deviation, IBS/Constipation, GERD, Superior Mesenteric Artery Syndrome, Arthritis, Osteopenia, Rheumatoid Arthritis, Scoliosis, ADHD, BiPolar 2, Insomnia, Anorexia Nervosa, Cannabis, Depression, Anxiety, Gastric Restrictive Surgery, Hyperlipidemia,  CPAP auto 5-15/ [employer] Download compliance-   10%, AHI 10.7/ hr              We asked [employer] to do a mask fitting Body weight- 107 lbs SGY 6/23-incisional hernia repair Download not recording, but patient reports routine nightly use of CPAP. Download reviewed.  Compliance and treatment goals discussed.  She recently got a fullface mask and says she definitely sleeps better with CPAP and is trying to use it every day.  We will ask DME to provide SD card.  She says she did not use CPAP at time of hernia repair.  03/20/23- 42 yoF former smoker followed for Mild OSA, complicated by Migraine, Nasal Septal Deviation, IBS/Constipation, GERD, Superior Mesenteric Artery Syndrome, Arthritis, Osteopenia, Rheumatoid Arthritis, Scoliosis, ADHD, BiPolar 2, Insomnia, Anorexia Nervosa, Cannabis, Depression, Anxiety, Gastric Restrictive Surgery, Hyperlipidemia,  CPAP auto 5-15/ [employer] Download compliance-      ROS-see HPI   + = positive Constitutional:    weight loss, night sweats, fevers, chills, fatigue, lassitude. HEENT:    +headaches, difficulty swallowing, +tooth/dental problems, sore throat,       sneezing, itching, ear ache,  nasal congestion, post nasal drip, snoring CV:    chest pain, orthopnea, PND, swelling in lower extremities, anasarca,                                   dizziness, palpitations Resp:   shortness of breath with exertion or at rest.                productive cough,   non-productive cough, coughing up of blood.              change in color of mucus.  wheezing.   Skin:    rash or lesions. GI: +heartburn, indigestion, +abdominal pain, nausea, vomiting, diarrhea,                 change in bowel habits, loss of appetite GU: dysuria, change in color of urine, no urgency or frequency.   flank pain. MS:   j+oint pain, stiffness, decreased range of motion, back pain. Neuro-     nothing unusual Psych:  change in mood or affect.  +depression or +anxiety.   memory loss.  OBJ- Physical Exam General- Alert, Oriented, Affect-appropriate, Distress- none acute, +petite/ slender Skin- rash-none, lesions- none, excoriation- none Lymphadenopathy- none Head- atraumatic            Eyes- Gross vision intact, PERRLA, conjunctivae and secretions clear            Ears- Hearing, canals-normal            Nose- Clear, no-Septal dev, mucus, polyps, erosion, perforation             Throat- Mallampati III , mucosa clear , drainage- none, tonsils- atrophic, +teeth Neck- flexible , trachea midline, no stridor ,  thyroid nl, carotid no bruit Chest - symmetrical excursion , unlabored           Heart/CV- RRR , no murmur , no gallop  , no rub, nl s1 s2                           - JVD- none , edema- none, stasis changes- none, varices- none           Lung- clear to P&A, wheeze- none, cough- none , dullness-none, rub- none           Chest wall-  Abd-  Br/ Gen/ Rectal- Not done, not indicated Extrem- cyanosis- none, clubbing, none, atrophy- none, strength- nl Neuro- grossly intact to observation

## 2023-03-20 ENCOUNTER — Ambulatory Visit: Payer: Medicare Other | Admitting: Internal Medicine
